# Patient Record
Sex: Female | Born: 1955 | Race: White | Hispanic: No | Smoking: Former smoker
Health system: Southern US, Community
[De-identification: ages and names within clinical notes are randomized; demographics above are authoritative.]

## PROBLEM LIST (undated history)

## (undated) DIAGNOSIS — K449 Diaphragmatic hernia without obstruction or gangrene: Secondary | ICD-10-CM

## (undated) DIAGNOSIS — T56891A Toxic effect of other metals, accidental (unintentional), initial encounter: Secondary | ICD-10-CM

## (undated) DIAGNOSIS — J309 Allergic rhinitis, unspecified: Secondary | ICD-10-CM

## (undated) DIAGNOSIS — F329 Major depressive disorder, single episode, unspecified: Secondary | ICD-10-CM

## (undated) DIAGNOSIS — J984 Other disorders of lung: Secondary | ICD-10-CM

## (undated) DIAGNOSIS — F410 Panic disorder [episodic paroxysmal anxiety] without agoraphobia: Secondary | ICD-10-CM

## (undated) DIAGNOSIS — G5793 Unspecified mononeuropathy of bilateral lower limbs: Secondary | ICD-10-CM

## (undated) DIAGNOSIS — E538 Deficiency of other specified B group vitamins: Secondary | ICD-10-CM

## (undated) DIAGNOSIS — I10 Essential (primary) hypertension: Secondary | ICD-10-CM

## (undated) DIAGNOSIS — F429 Obsessive-compulsive disorder, unspecified: Secondary | ICD-10-CM

## (undated) DIAGNOSIS — F32A Depression, unspecified: Secondary | ICD-10-CM

## (undated) DIAGNOSIS — R7301 Impaired fasting glucose: Secondary | ICD-10-CM

## (undated) DIAGNOSIS — L509 Urticaria, unspecified: Secondary | ICD-10-CM

## (undated) DIAGNOSIS — R42 Dizziness and giddiness: Secondary | ICD-10-CM

## (undated) DIAGNOSIS — T783XXA Angioneurotic edema, initial encounter: Secondary | ICD-10-CM

## (undated) DIAGNOSIS — F419 Anxiety disorder, unspecified: Secondary | ICD-10-CM

## (undated) DIAGNOSIS — E559 Vitamin D deficiency, unspecified: Secondary | ICD-10-CM

## (undated) DIAGNOSIS — N951 Menopausal and female climacteric states: Secondary | ICD-10-CM

## (undated) DIAGNOSIS — F319 Bipolar disorder, unspecified: Secondary | ICD-10-CM

## (undated) HISTORY — PX: TONSILLECTOMY: SHX5217

## (undated) HISTORY — DX: Allergic rhinitis, unspecified: J30.9

## (undated) HISTORY — PX: DILATION AND CURETTAGE OF UTERUS: SHX78

## (undated) HISTORY — DX: Panic disorder (episodic paroxysmal anxiety): F41.0

## (undated) HISTORY — DX: Morbid (severe) obesity due to excess calories: E66.01

## (undated) HISTORY — DX: Angioneurotic edema, initial encounter: T78.3XXA

## (undated) HISTORY — DX: Depression, unspecified: F32.A

## (undated) HISTORY — DX: Obsessive-compulsive disorder, unspecified: F42.9

## (undated) HISTORY — DX: Vitamin D deficiency, unspecified: E55.9

## (undated) HISTORY — DX: Diaphragmatic hernia without obstruction or gangrene: K44.9

## (undated) HISTORY — DX: Impaired fasting glucose: R73.01

## (undated) HISTORY — DX: Urticaria, unspecified: L50.9

## (undated) HISTORY — DX: Major depressive disorder, single episode, unspecified: F32.9

## (undated) HISTORY — DX: Anxiety disorder, unspecified: F41.9

## (undated) HISTORY — DX: Toxic effect of other metals, accidental (unintentional), initial encounter: T56.891A

## (undated) HISTORY — DX: Other disorders of lung: J98.4

## (undated) HISTORY — DX: Essential (primary) hypertension: I10

## (undated) HISTORY — DX: Bipolar disorder, unspecified: F31.9

## (undated) HISTORY — DX: Menopausal and female climacteric states: N95.1

## (undated) HISTORY — DX: Deficiency of other specified B group vitamins: E53.8

---

## 2012-03-17 DIAGNOSIS — I10 Essential (primary) hypertension: Secondary | ICD-10-CM | POA: Insufficient documentation

## 2013-09-28 ENCOUNTER — Ambulatory Visit: Payer: Self-pay | Admitting: Family Medicine

## 2013-09-28 DIAGNOSIS — I517 Cardiomegaly: Secondary | ICD-10-CM

## 2014-07-01 ENCOUNTER — Ambulatory Visit: Payer: Self-pay | Admitting: Family Medicine

## 2014-11-14 ENCOUNTER — Other Ambulatory Visit: Payer: Self-pay | Admitting: Family Medicine

## 2014-11-14 NOTE — Telephone Encounter (Signed)
Routing to provider  

## 2014-11-22 ENCOUNTER — Other Ambulatory Visit: Payer: Self-pay | Admitting: Family Medicine

## 2014-11-22 NOTE — Telephone Encounter (Signed)
Please check Practice Partner I just approved 60+6 on May 2nd She should not be out Please have pt contact pharmacy, she should not run out until around December 2nd I hit the red X to indicate I was not refilling because it was too soon, and it appeared to take it off the med list There was another fluoxetine on there already for 20 mg once a day and I think I corrected it, but it's gone too Can you please update med list if I've messed it up? I just didn't want to refill the medicine Thank you, Amy

## 2014-11-22 NOTE — Telephone Encounter (Signed)
Routing to provider  

## 2014-11-25 NOTE — Telephone Encounter (Signed)
Med list updated

## 2014-12-09 ENCOUNTER — Other Ambulatory Visit: Payer: Self-pay | Admitting: Family Medicine

## 2014-12-09 NOTE — Telephone Encounter (Signed)
Reviewed last labs Creatinine normal Reviewed Practice Partner She should not need any; plenty of refills sent in May Rx declined

## 2014-12-19 ENCOUNTER — Other Ambulatory Visit: Payer: Self-pay | Admitting: Family Medicine

## 2014-12-19 NOTE — Telephone Encounter (Signed)
Routing to provider  

## 2014-12-19 NOTE — Telephone Encounter (Signed)
Pt called to check the status of her refill request. Thanks.

## 2014-12-20 NOTE — Telephone Encounter (Signed)
Patient overdue for visit; please schedule her for an appointment, fasting labs in the next few weeks I'll send Rx

## 2014-12-23 DIAGNOSIS — J984 Other disorders of lung: Secondary | ICD-10-CM | POA: Insufficient documentation

## 2014-12-23 DIAGNOSIS — E538 Deficiency of other specified B group vitamins: Secondary | ICD-10-CM | POA: Insufficient documentation

## 2014-12-23 DIAGNOSIS — R7301 Impaired fasting glucose: Secondary | ICD-10-CM | POA: Insufficient documentation

## 2014-12-23 DIAGNOSIS — F329 Major depressive disorder, single episode, unspecified: Secondary | ICD-10-CM | POA: Insufficient documentation

## 2014-12-23 DIAGNOSIS — N951 Menopausal and female climacteric states: Secondary | ICD-10-CM | POA: Insufficient documentation

## 2014-12-23 DIAGNOSIS — F419 Anxiety disorder, unspecified: Secondary | ICD-10-CM | POA: Insufficient documentation

## 2014-12-23 DIAGNOSIS — I129 Hypertensive chronic kidney disease with stage 1 through stage 4 chronic kidney disease, or unspecified chronic kidney disease: Secondary | ICD-10-CM | POA: Insufficient documentation

## 2014-12-23 DIAGNOSIS — E559 Vitamin D deficiency, unspecified: Secondary | ICD-10-CM | POA: Insufficient documentation

## 2014-12-23 DIAGNOSIS — F32A Depression, unspecified: Secondary | ICD-10-CM | POA: Insufficient documentation

## 2014-12-25 ENCOUNTER — Encounter: Payer: Self-pay | Admitting: Family Medicine

## 2014-12-25 ENCOUNTER — Ambulatory Visit (INDEPENDENT_AMBULATORY_CARE_PROVIDER_SITE_OTHER): Payer: No Typology Code available for payment source | Admitting: Family Medicine

## 2014-12-25 VITALS — BP 165/82 | HR 75 | Temp 98.4°F | Ht 61.5 in | Wt 263.0 lb

## 2014-12-25 DIAGNOSIS — F429 Obsessive-compulsive disorder, unspecified: Secondary | ICD-10-CM

## 2014-12-25 DIAGNOSIS — F329 Major depressive disorder, single episode, unspecified: Secondary | ICD-10-CM | POA: Diagnosis not present

## 2014-12-25 DIAGNOSIS — K5909 Other constipation: Secondary | ICD-10-CM | POA: Diagnosis not present

## 2014-12-25 DIAGNOSIS — N289 Disorder of kidney and ureter, unspecified: Secondary | ICD-10-CM | POA: Diagnosis not present

## 2014-12-25 DIAGNOSIS — I10 Essential (primary) hypertension: Secondary | ICD-10-CM | POA: Diagnosis not present

## 2014-12-25 DIAGNOSIS — E785 Hyperlipidemia, unspecified: Secondary | ICD-10-CM | POA: Diagnosis not present

## 2014-12-25 DIAGNOSIS — K5900 Constipation, unspecified: Secondary | ICD-10-CM | POA: Insufficient documentation

## 2014-12-25 DIAGNOSIS — F42 Obsessive-compulsive disorder: Secondary | ICD-10-CM | POA: Diagnosis not present

## 2014-12-25 DIAGNOSIS — Z5181 Encounter for therapeutic drug level monitoring: Secondary | ICD-10-CM | POA: Insufficient documentation

## 2014-12-25 DIAGNOSIS — K760 Fatty (change of) liver, not elsewhere classified: Secondary | ICD-10-CM | POA: Insufficient documentation

## 2014-12-25 DIAGNOSIS — Z79899 Other long term (current) drug therapy: Secondary | ICD-10-CM | POA: Diagnosis not present

## 2014-12-25 DIAGNOSIS — F32A Depression, unspecified: Secondary | ICD-10-CM

## 2014-12-25 DIAGNOSIS — R7301 Impaired fasting glucose: Secondary | ICD-10-CM | POA: Diagnosis not present

## 2014-12-25 DIAGNOSIS — M170 Bilateral primary osteoarthritis of knee: Secondary | ICD-10-CM | POA: Diagnosis not present

## 2014-12-25 NOTE — Assessment & Plan Note (Signed)
Not controlled today, but patient has been off of one of her medicines; avers that BP is good at home; goal systolic is less than 892 mmHg; weight loss and DASH guidelines and stress reduction will help control BP

## 2014-12-25 NOTE — Assessment & Plan Note (Signed)
Noted on CT scan March 2016; weight loss and better eating would help

## 2014-12-25 NOTE — Assessment & Plan Note (Signed)
Work on weight loss, limit eggs to 3 per week or less, reduce saturated fats; try to become active

## 2014-12-25 NOTE — Patient Instructions (Addendum)
Keep up the good effort You got this! We'll contact you about your labs Return in 3 months Monitor BP at home and call if not under 130 mmHg on top Try to get 25 or more grams of fiber a day Drink 64 ounces of water a day Move more and that should help I do want you to have the MRI so please reschedule that

## 2014-12-25 NOTE — Assessment & Plan Note (Signed)
Check creatinine and lithium level

## 2014-12-25 NOTE — Assessment & Plan Note (Signed)
Needs pre- and post-contrast MRI of the right kidney; stressed to patient the importance of getting this done; don't know if benign or cancer, but we need to find out; she will call

## 2014-12-25 NOTE — Assessment & Plan Note (Signed)
Limit sweets and check A1C; work on weight loss; discussed activity

## 2014-12-25 NOTE — Assessment & Plan Note (Signed)
While I would love to have patient OFF of NSAIDs, she cannot walk and her quality of life suffers; will grudgingly continue the NSAID; encouraged weight loss, do f/u with orthopaedist when able

## 2014-12-25 NOTE — Assessment & Plan Note (Signed)
Continue lithium, been on that for years for this; current dose controlling symptoms

## 2014-12-25 NOTE — Progress Notes (Signed)
BP 165/82 mmHg  Pulse 75  Temp(Src) 98.4 F (36.9 C)  Ht 5' 1.5" (1.562 m)  Wt 263 lb (119.296 kg)  BMI 48.89 kg/m2  SpO2 96%   Subjective:    Patient ID: Jaime Freeman, female    DOB: Jan 21, 1956, 59 y.o.   MRN: [id number]  HPI: Jaime Freeman is a 59 y.o. female  Chief Complaint  Patient presents with  . Hypertension  . Hyperlipidemia  . IFG   Hypertension; she checks it at home and it's been running well, but has run out one of her medicines two days ago; had been out of benazepril for two days but is getting back on that today; BP has been running 130s and sometimes 120s; usually controlled; she says it's just that she hasn't had her medicine; no s/s of stroke; she did really good until recently; she needs to be chewed out she says; she was eating all the time, not eating the right foods; knew she was gaining weight; she has tried to watch her cholesterol and fat intake, but was eating spaghetti and lasagna; she knew she couldn't back on weight loss efforts until the stress was dealt was dealt with; she was sneaking cookies and no one knows it; it was battle; she is back on track; it has been only a week of being back on track; she stayed in bed more during the day, knees hurt and tension headaches, now getting better  She does not see psychiatrist; she does not want to see a counselor, even a Marketing executive; she says Dr. 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 Interim medical history since our last  visit reviewed. Allergies and medications reviewed and updated.  Review of Systems Per HPI unless specifically indicated above     Objective:    BP 165/82 mmHg  Pulse 75  Temp(Src) 98.4 F (36.9 C)  Ht 5' 1.5" (1.562 m)  Wt 263 lb (119.296 kg)  BMI 48.89 kg/m2  SpO2 96%  Wt Readings from Last 3 Encounters:  12/25/14 263 lb (119.296 kg)  08/26/14 257 lb (116.574 kg)    Physical Exam  Constitutional: She appears well-developed and well-nourished. No distress.  HENT:  Head: Normocephalic and atraumatic.  Eyes: EOM are normal. No scleral icterus.  Neck: No thyromegaly present.  Cardiovascular: Normal rate, regular rhythm and normal heart sounds.   No murmur heard. Pulmonary/Chest: Effort normal and breath sounds normal. No respiratory distress. She has no wheezes.  Abdominal: Soft. Bowel sounds are normal. She exhibits no distension.  Morbidly obese  Musculoskeletal: Normal range of motion. She exhibits no edema.  Neurological: She is alert. She exhibits normal muscle tone.  Skin: Skin is warm and dry. She is not diaphoretic. No pallor.  Psychiatric: She has a normal mood and affect. Her speech is normal and behavior is normal. Judgment and thought content normal. Cognition and memory are normal.  Briefly tearful when discussing the stress she's been under with church issues the last few months,  good eye contact with examiner   No results found for this or any previous visit.    Assessment & Plan:   Problem List Items Addressed This Visit      Cardiovascular and Mediastinum   Hypertension - Primary    Not controlled today, but patient has been off of one of her medicines; avers that BP is good at home; goal systolic is less than 704 mmHg; weight loss and DASH guidelines and stress reduction will help control BP        Digestive   Constipation   Relevant Orders   TSH   Fatty liver    Noted on CT scan March 2016; weight loss and better eating would help         Endocrine   IFG (impaired fasting glucose)    Limit sweets and check A1C; work on weight loss; discussed activity      Relevant Orders   Hgb A1c w/o eAG   Lipid Panel w/o Chol/HDL Ratio     Musculoskeletal and Integument   Osteoarthritis of both knees    While I would love to have patient OFF of NSAIDs, she cannot walk and her quality of life suffers; will grudgingly continue the NSAID; encouraged weight loss, do f/u with orthopaedist when able        Genitourinary   Kidney lesion    Needs pre- and post-contrast MRI of the right kidney; stressed to patient the importance of getting this done; don't know if benign or cancer, but we need to find out; she will call        Other   Depression    Recent stressors resolving; offered counseling, politely declined because she does not feel necessary right now; will continue medicine      OCD (obsessive compulsive disorder)    Continue lithium, been on that for years for this; current dose controlling symptoms      Encounter for lithium monitoring    Check creatinine and lithium level      Relevant Orders   Lithium level   Medication monitoring encounter   Relevant Orders   Comprehensive metabolic panel   Dyslipidemia    Work on weight loss, limit eggs to 3 per week or less, reduce saturated fats; try to become active          Follow up plan: Return in about 3 months (around 03/26/2015) for multiple issues.  An after-visit summary was printed and given to the patient at [facility].  Please see the patient instructions which may contain other information and recommendations beyond what is mentioned above in the assessment and plan.  Orders Placed This Encounter  Procedures  . Comprehensive metabolic panel  . Hgb A1c w/o eAG  . Lipid Panel w/o Chol/HDL Ratio  . Lithium level  . TSH

## 2014-12-25 NOTE — Assessment & Plan Note (Signed)
Recent stressors resolving; offered counseling, politely declined because she does not feel necessary right now; will continue medicine

## 2014-12-26 ENCOUNTER — Encounter: Payer: Self-pay | Admitting: Family Medicine

## 2014-12-26 LAB — COMPREHENSIVE METABOLIC PANEL
A/G RATIO: 1.9 (ref 1.1–2.5)
ALT: 14 IU/L (ref 0–32)
AST: 15 IU/L (ref 0–40)
Albumin: 4.3 g/dL (ref 3.5–5.5)
Alkaline Phosphatase: 60 IU/L (ref 39–117)
BILIRUBIN TOTAL: 0.4 mg/dL (ref 0.0–1.2)
BUN/Creatinine Ratio: 20 (ref 9–23)
BUN: 16 mg/dL (ref 6–24)
CALCIUM: 9.8 mg/dL (ref 8.7–10.2)
CO2: 23 mmol/L (ref 18–29)
Chloride: 97 mmol/L (ref 97–108)
Creatinine, Ser: 0.8 mg/dL (ref 0.57–1.00)
GFR, EST AFRICAN AMERICAN: 94 mL/min/{1.73_m2} (ref >59)
GFR, EST NON AFRICAN AMERICAN: 82 mL/min/{1.73_m2} (ref >59)
GLOBULIN, TOTAL: 2.3 g/dL (ref 1.5–4.5)
Glucose: 108 mg/dL — ABNORMAL HIGH (ref 65–99)
POTASSIUM: 4.1 mmol/L (ref 3.5–5.2)
SODIUM: 138 mmol/L (ref 134–144)
Total Protein: 6.6 g/dL (ref 6.0–8.5)

## 2014-12-26 LAB — LIPID PANEL W/O CHOL/HDL RATIO
CHOLESTEROL TOTAL: 156 mg/dL (ref 100–199)
HDL: 55 mg/dL (ref >39)
LDL CALC: 75 mg/dL (ref 0–99)
Triglycerides: 132 mg/dL (ref 0–149)
VLDL Cholesterol Cal: 26 mg/dL (ref 5–40)

## 2014-12-26 LAB — HGB A1C W/O EAG: Hgb A1c MFr Bld: 6.4 % — ABNORMAL HIGH (ref 4.8–5.6)

## 2014-12-26 LAB — TSH: TSH: 2.58 u[IU]/mL (ref 0.450–4.500)

## 2014-12-26 LAB — LITHIUM LEVEL: LITHIUM LVL: 0.7 mmol/L (ref 0.6–1.4)

## 2015-01-24 ENCOUNTER — Other Ambulatory Visit: Payer: Self-pay | Admitting: Family Medicine

## 2015-01-24 NOTE — Telephone Encounter (Signed)
Routing to provider  

## 2015-01-24 NOTE — Telephone Encounter (Signed)
Reviewed creatinine and K+ from December 25, 2014; Rx approved

## 2015-03-05 ENCOUNTER — Telehealth: Payer: Self-pay | Admitting: Family Medicine

## 2015-03-05 ENCOUNTER — Other Ambulatory Visit: Payer: Self-pay | Admitting: Family Medicine

## 2015-03-05 MED ORDER — ATORVASTATIN CALCIUM 10 MG PO TABS: 10.0000 mg | ORAL_TABLET | Freq: Every day | ORAL | Status: DC

## 2015-03-26 ENCOUNTER — Ambulatory Visit: Payer: No Typology Code available for payment source | Admitting: Family Medicine

## 2015-03-26 NOTE — Telephone Encounter (Signed)
Rx was refilled on 03/05/15 for 30 with 1 refill. Patient notified.

## 2015-03-26 NOTE — Telephone Encounter (Signed)
Pt came in stated she needs a refill on Atorvastatin. Pharm is [facility]. Pt has been rescheduled for 04/07/15. Thanks.

## 2015-04-07 ENCOUNTER — Encounter: Payer: Self-pay | Admitting: Family Medicine

## 2015-04-07 ENCOUNTER — Ambulatory Visit (INDEPENDENT_AMBULATORY_CARE_PROVIDER_SITE_OTHER): Payer: Self-pay | Admitting: Family Medicine

## 2015-04-07 ENCOUNTER — Other Ambulatory Visit: Payer: Self-pay | Admitting: Family Medicine

## 2015-04-07 VITALS — BP 147/84 | HR 77 | Temp 97.8°F | Wt 261.0 lb

## 2015-04-07 DIAGNOSIS — R6881 Early satiety: Secondary | ICD-10-CM

## 2015-04-07 DIAGNOSIS — R140 Abdominal distension (gaseous): Secondary | ICD-10-CM | POA: Insufficient documentation

## 2015-04-07 DIAGNOSIS — F429 Obsessive-compulsive disorder, unspecified: Secondary | ICD-10-CM

## 2015-04-07 DIAGNOSIS — K760 Fatty (change of) liver, not elsewhere classified: Secondary | ICD-10-CM

## 2015-04-07 DIAGNOSIS — N289 Disorder of kidney and ureter, unspecified: Secondary | ICD-10-CM

## 2015-04-07 DIAGNOSIS — R7301 Impaired fasting glucose: Secondary | ICD-10-CM

## 2015-04-07 DIAGNOSIS — Z79899 Other long term (current) drug therapy: Secondary | ICD-10-CM

## 2015-04-07 DIAGNOSIS — L738 Other specified follicular disorders: Secondary | ICD-10-CM

## 2015-04-07 DIAGNOSIS — D3701 Neoplasm of uncertain behavior of lip: Secondary | ICD-10-CM

## 2015-04-07 DIAGNOSIS — M170 Bilateral primary osteoarthritis of knee: Secondary | ICD-10-CM

## 2015-04-07 DIAGNOSIS — I10 Essential (primary) hypertension: Secondary | ICD-10-CM

## 2015-04-07 DIAGNOSIS — E785 Hyperlipidemia, unspecified: Secondary | ICD-10-CM

## 2015-04-07 DIAGNOSIS — Z5181 Encounter for therapeutic drug level monitoring: Secondary | ICD-10-CM

## 2015-04-07 MED ORDER — LITHIUM CARBONATE 300 MG PO CAPS: 600.0000 mg | ORAL_CAPSULE | Freq: Two times a day (BID) | ORAL | Status: DC

## 2015-04-07 MED ORDER — BENAZEPRIL HCL 20 MG PO TABS: 20.0000 mg | ORAL_TABLET | Freq: Every day | ORAL | Status: DC

## 2015-04-07 MED ORDER — HYDROCHLOROTHIAZIDE 12.5 MG PO CAPS: 12.5000 mg | ORAL_CAPSULE | Freq: Every day | ORAL | Status: DC

## 2015-04-07 NOTE — Telephone Encounter (Signed)
It looks like most of these will be due in Jan.

## 2015-04-07 NOTE — Telephone Encounter (Signed)
I need to know who is prescribing her Klonopin; please call her Thank you

## 2015-04-07 NOTE — Telephone Encounter (Signed)
Pt stated she forgot to tell Dr. Sanda Klein she needs refills on all medications. Pharm is [facility]. Thanks.

## 2015-04-07 NOTE — Progress Notes (Signed)
BP 147/84 mmHg  Pulse 77  Temp(Src) 97.8 F (36.6 C)  Wt 261 lb (118.389 kg)  SpO2 97%   Subjective:    Patient ID: Jaime Freeman, female    DOB: 06-Aug-1955, 59 y.o.   MRN: [id number]  HPI: Jaime Freeman is a 59 y.o. female  Chief Complaint  Patient presents with  . Hypertension  . IFG  . Obesity   High blood pressure; it was higher a few months ago, 150s and 160s a few months ago; they merged with another church; they left and stress levels are coming down and BP returning to normal; has not checked BP for the last 2-3 weeks  Impaired fasting glucose; next A1C due late Feb or early March; God is dealing with her with the temptation; it's Christmas and it's hard to go cold [place]; she and her husband are going to go on a strict diet and exercise December 25, 2014: Hgb A1c MFr Bld 4.8 - 5.6 % 6.4 (H)       Obesity; she craves sweets, very easy to fall into the temptation; see above; she has lost two pounds since last visit; she can tell that she has gained weight in her abdomen  She used to take a statin; not now; we reviewed her last lipids, done in August 2016: Cholesterol, Total 100 - 199 mg/dL 156   Triglycerides 0 - 149 mg/dL 132   HDL >39 mg/dL 55   Comments: According to ATP-III Guidelines, HDL-C >59 mg/dL is considered a  negative risk factor for CHD.     VLDL Cholesterol Cal 5 - 40 mg/dL 26   LDL Calculated 0 - 99 mg/dL 75        Bipolar disorder; taking lithium and SSRI Last labs end of August 2016; next due late Feb or early March 2017 Lithium Lvl 0.6 - 1.4 mmol/L 0.7       Glucose 65 - 99 mg/dL 108 (H)   BUN 6 - 24 mg/dL 16   Creatinine, Ser 0.57 - 1.00 mg/dL 0.80   GFR calc non Af Amer >59 mL/min/1.73 82   GFR calc Af Amer >59 mL/min/1.73 94   BUN/Creatinine Ratio 9 - 23  20   Sodium 134 - 144 mmol/L 138   Potassium 3.5 - 5.2 mmol/L 4.1   Chloride 97 - 108 mmol/L 97   CO2 18 - 29 mmol/L 23   Calcium 8.7 - 10.2 mg/dL 9.8         Bilirubin Total 0.0 - 1.2 mg/dL 0.4   Alkaline Phosphatase 39 - 117 IU/L 60   AST 0 - 40 IU/L 15   ALT 0 - 32 IU/L 14        She has had two pimple like things, one on the lip (lower) and one on the abdomen  Her legs hurt, that is normal for her in a way; at this time of her; she has arthritis in her knees; taking etodolac; in the winter, her legs will ache; she has had venous duplex scans already beefore and they never show anything; she moved the other day and something pinched and it caused pain all the way down to the foot; her calves get sore, aching; muscles are a little tight; her feet are going numb like crazy; her feet will draw up and it will cramp; hands don't draw up; feeling skipped beats, flutter feeling occasionally  She has some swelling in her legs; worse at night; when sitting; consider peripheral  compression; echo 2015 was reviewed, LVEF 55-60%  Renal lesion on [place] from March; she never got the MRI, she says it was insurance thing and apparently she just didn't get around to it; she was worried I would be upset (I told her no, but I do want [place] to look into this right away)  Throbbing on the left side near ovary; lasts five minutes, thirty minutes; few and far between; no early satiety; doesn't eat as fast, thinks her stomach has shrunk; sometimes have swelling, just overweight and feels that way; had some constipation when first starting the statin, that is resolved now with colace completely; no nausea or vomiting; no blood in urine or stool  Relevant past medical, surgical, family and social history reviewed and updated as indicated. Interim medical history since our last visit reviewed. Allergies and medications reviewed and updated.  Review of Systems  Respiratory: Positive for shortness of breath (at the mall; she has been lazy lately; not walking, not getting exercise; would just lay for days, being out of shape).   Cardiovascular: Negative for chest pain.  that  is all better now; just deconditioning  Per HPI unless specifically indicated above     Objective:    BP 147/84 mmHg  Pulse 77  Temp(Src) 97.8 F (36.6 C)  Wt 261 lb (118.389 kg)  SpO2 97%  Wt Readings from Last 3 Encounters:  04/07/15 261 lb (118.389 kg)  12/25/14 263 lb (119.296 kg)  08/26/14 257 lb (116.574 kg)  body mass index is 48.52 kg/(m^2).  Physical Exam  Constitutional: She appears well-nourished. No distress.  Weight down two pounds since August; morbidly obese  HENT:  Nose: No rhinorrhea.  Mouth/Throat: Mucous membranes are normal.  Eyes: EOM are normal. No scleral icterus.  Neck: No thyromegaly present.  Cardiovascular: Normal rate and regular rhythm.   Pulmonary/Chest: Effort normal and breath sounds normal.  Abdominal: Soft. She exhibits no distension (obese). There is tenderness (mild; morbid obesity limits sensitivity of physical exam, as I am unable to palpate any masses) in the left lower quadrant.  Musculoskeletal: She exhibits no edema (no pitting edema of lower extremities).  Neurological: She is alert.  Skin: Skin is warm. Lesion (vermillion border lower lip, 2 mm papule with central pore; 3 mm keratotic lesion abdomen) noted.  Psychiatric: She has a normal mood and affect. Her behavior is normal. Judgment and thought content normal.   Results for orders placed or performed in visit on 12/25/14  Comprehensive metabolic panel  Result Value Ref Range   Glucose 108 (H) 65 - 99 mg/dL   BUN 16 6 - 24 mg/dL   Creatinine, Ser 0.80 0.57 - 1.00 mg/dL   GFR calc non Af Amer 82 >59 mL/min/1.73   GFR calc Af Amer 94 >59 mL/min/1.73   BUN/Creatinine Ratio 20 9 - 23   Sodium 138 134 - 144 mmol/L   Potassium 4.1 3.5 - 5.2 mmol/L   Chloride 97 97 - 108 mmol/L   CO2 23 18 - 29 mmol/L   Calcium 9.8 8.7 - 10.2 mg/dL   Total Protein 6.6 6.0 - 8.5 g/dL   Albumin 4.3 3.5 - 5.5 g/dL   Globulin, Total 2.3 1.5 - 4.5 g/dL   Albumin/Globulin Ratio 1.9 1.1 - 2.5    Bilirubin Total 0.4 0.0 - 1.2 mg/dL   Alkaline Phosphatase 60 39 - 117 IU/L   AST 15 0 - 40 IU/L   ALT 14 0 - 32 IU/L  Hgb A1c w/o  eAG  Result Value Ref Range   Hgb A1c MFr Bld 6.4 (H) 4.8 - 5.6 %  Lipid Panel w/o Chol/HDL Ratio  Result Value Ref Range   Cholesterol, Total 156 100 - 199 mg/dL   Triglycerides 132 0 - 149 mg/dL   HDL 55 >39 mg/dL   VLDL Cholesterol Cal 26 5 - 40 mg/dL   LDL Calculated 75 0 - 99 mg/dL  Lithium level  Result Value Ref Range   Lithium Lvl 0.7 0.6 - 1.4 mmol/L  TSH  Result Value Ref Range   TSH 2.580 0.450 - 4.500 uIU/mL      Assessment & Plan:   Problem List Items Addressed This Visit      Cardiovascular and Mediastinum   Hypertension    Not quite to goal; her stress level is easing off; weight loss, DASH guidelines; see AVS        Digestive   Fatty liver    Weight loss key, along with healthy diet; last LFTs in normal range; recheck in late Feb, early March      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, vermilion border    Currently, this appears to be an inclusion cyst; however, the location is a prime one for skin cancer; watch carefully and if growing or changing at all, we'll have her see dermatology        Endocrine   IFG (impaired fasting glucose) - Primary    Return in late Feb or early march, check A1c; weight loss is key to preventing progression to frank diabetes; gave her my analogy of window AC unit, unable to keep up if house gets addition; work on reducing body habitus; follow A1c every 6 months; healthy eating, activity key        Musculoskeletal and Integument   Osteoarthritis of both knees    AAOS now recommends weight loss as a focus for OA of the knees      Sebaceous gland hyperplasia of face    Explained that this particular lesion appears to be a benign condition        Genitourinary   Kidney lesion    We reviewed the last [place]; 12 mm lesion right kidney; discussed options; she would rather not get  expensive MRI; we agreed to get [place] to save money and then go from there      Relevant Orders   US Renal     Other   Morbid obesity ([place])    So very important for her to lose weight; see AVS      OCD (obsessive compulsive disorder)    Continue SSRI      Encounter for lithium monitoring    Recheck lithium level in late Feb or early March 2017      Dyslipidemia    Recheck lipids in late Feb or early March 2017 (fasting); weight loss and healthy eating are key      Early satiety   Relevant Orders   US Pelvis Complete   Bloating    Will get [place] of pelvis to r/o ovarian lesion      Relevant Orders   US Pelvis Complete      Follow up plan: Return in about 1 month (around 05/08/2015) for multiple issues, 30 minute visit.  Level 5 Face-to-face time with patient was more than 45 minutes, >50% time spent counseling and coordination of care  An after-visit summary was printed and given to the patient at [facility].  Please see the  patient instructions which may contain other information and recommendations beyond what is mentioned above in the assessment and plan.

## 2015-04-07 NOTE — Patient Instructions (Addendum)
With exercise, start slowly and build up very gradually Watch the places on your skin and if they grow or bleed or change (scab or turn colors), then see a dermatologist Drink 4 ounces of tonic water every evening Start taking 250 mg of magnesium oxide daily or eat more foods rich in magnesium Stop the atorvastatin for one month Do gentle stretches for the feet and legs Start taking vitamin D 2,000 iu daily (vitamin D3) Try stretches for your feet (belt or cord or towel over the balls of the feet and gently pull back) Try to work on weight loss  Check out the information at familydoctor.org entitled "What It Takes to Lose Weight" Try to lose between 1-2 pounds per week by taking in fewer calories and burning off more calories You can succeed by limiting portions, limiting foods dense in calories and fat, becoming more active, and drinking 8 glasses of water a day (64 ounces) Don't skip meals, especially breakfast, as skipping meals may alter your metabolism Do not use over-the-counter weight loss pills or gimmicks that claim rapid weight loss A healthy BMI (or body mass index) is between 18.5 and 24.9 You can calculate your ideal BMI at the [place] website [employer]Mon[employer]fr  We'll get the ultrasound of the abdomen and pelvis Return in one month for recheck of multiple issues

## 2015-04-07 NOTE — Assessment & Plan Note (Addendum)
We reviewed the last [place]; 12 mm lesion right kidney; discussed options; she would rather not get expensive MRI; we agreed to get [place] to save money and then go from there

## 2015-04-07 NOTE — Assessment & Plan Note (Addendum)
Return in late Feb or early march, check A1c; weight loss is key to preventing progression to frank diabetes; gave her my analogy of window AC unit, unable to keep up if house gets addition; work on reducing body habitus; follow A1c every 6 months; healthy eating, activity key

## 2015-04-08 NOTE — Telephone Encounter (Signed)
Dr. Jeananne Rama was prescribing it to begin with and you last wrote it in May of this year for a qty of 10 per PP. She doesn't use it often, just wanted to have it on hold in case. She is not out right now.

## 2015-04-09 ENCOUNTER — Other Ambulatory Visit: Payer: Self-pay | Admitting: Family Medicine

## 2015-04-09 DIAGNOSIS — R140 Abdominal distension (gaseous): Secondary | ICD-10-CM

## 2015-04-09 NOTE — Assessment & Plan Note (Signed)
transvag US needed per staff, ordered

## 2015-04-12 DIAGNOSIS — L738 Other specified follicular disorders: Secondary | ICD-10-CM | POA: Insufficient documentation

## 2015-04-12 DIAGNOSIS — D3701 Neoplasm of uncertain behavior of lip: Secondary | ICD-10-CM | POA: Insufficient documentation

## 2015-04-12 NOTE — Assessment & Plan Note (Signed)
AAOS now recommends weight loss as a focus for OA of the knees

## 2015-04-12 NOTE — Assessment & Plan Note (Signed)
- 

## 2015-04-12 NOTE — Assessment & Plan Note (Signed)
Currently, this appears to be an inclusion cyst; however, the location is a prime one for skin cancer; watch carefully and if growing or changing at all, we'll have her see dermatology

## 2015-04-12 NOTE — Assessment & Plan Note (Signed)
So very important for her to lose weight; see AVS

## 2015-04-12 NOTE — Assessment & Plan Note (Signed)
Recheck lipids in late Feb or early March 2017 (fasting); weight loss and healthy eating are key

## 2015-04-12 NOTE — Assessment & Plan Note (Signed)
Not quite to goal; her stress level is easing off; weight loss, DASH guidelines; see AVS

## 2015-04-12 NOTE — Assessment & Plan Note (Signed)
Will get [place] of pelvis to r/o ovarian lesion

## 2015-04-12 NOTE — Assessment & Plan Note (Signed)
Weight loss key, along with healthy diet; last LFTs in normal range; recheck in late Feb, early March

## 2015-04-12 NOTE — Assessment & Plan Note (Signed)
Explained that this particular lesion appears to be a benign condition

## 2015-04-12 NOTE — Assessment & Plan Note (Signed)
Recheck lithium level in late Feb or early March 2017

## 2015-04-29 ENCOUNTER — Ambulatory Visit
Admission: RE | Admit: 2015-04-29 | Discharge: 2015-04-29 | Disposition: A | Discharge status: Home / Self Care | Payer: BLUE CROSS/BLUE SHIELD | Source: Ambulatory Visit | Attending: Family Medicine | Admitting: Family Medicine

## 2015-04-29 DIAGNOSIS — Z780 Asymptomatic menopausal state: Secondary | ICD-10-CM | POA: Diagnosis not present

## 2015-04-29 DIAGNOSIS — R140 Abdominal distension (gaseous): Secondary | ICD-10-CM | POA: Diagnosis not present

## 2015-04-29 DIAGNOSIS — N289 Disorder of kidney and ureter, unspecified: Secondary | ICD-10-CM | POA: Insufficient documentation

## 2015-04-29 DIAGNOSIS — R6881 Early satiety: Secondary | ICD-10-CM | POA: Diagnosis not present

## 2015-05-08 ENCOUNTER — Encounter: Payer: Self-pay | Admitting: Family Medicine

## 2015-05-08 ENCOUNTER — Ambulatory Visit (INDEPENDENT_AMBULATORY_CARE_PROVIDER_SITE_OTHER): Payer: BLUE CROSS/BLUE SHIELD | Admitting: Family Medicine

## 2015-05-08 VITALS — BP 132/74 | HR 68 | Temp 97.9°F | Wt 263.0 lb

## 2015-05-08 DIAGNOSIS — R7301 Impaired fasting glucose: Secondary | ICD-10-CM

## 2015-05-08 DIAGNOSIS — N289 Disorder of kidney and ureter, unspecified: Secondary | ICD-10-CM

## 2015-05-08 DIAGNOSIS — M170 Bilateral primary osteoarthritis of knee: Secondary | ICD-10-CM | POA: Diagnosis not present

## 2015-05-08 DIAGNOSIS — I10 Essential (primary) hypertension: Secondary | ICD-10-CM | POA: Diagnosis not present

## 2015-05-08 DIAGNOSIS — K760 Fatty (change of) liver, not elsewhere classified: Secondary | ICD-10-CM | POA: Diagnosis not present

## 2015-05-08 DIAGNOSIS — G629 Polyneuropathy, unspecified: Secondary | ICD-10-CM | POA: Insufficient documentation

## 2015-05-08 DIAGNOSIS — Z79899 Other long term (current) drug therapy: Secondary | ICD-10-CM

## 2015-05-08 DIAGNOSIS — E785 Hyperlipidemia, unspecified: Secondary | ICD-10-CM | POA: Diagnosis not present

## 2015-05-08 DIAGNOSIS — G63 Polyneuropathy in diseases classified elsewhere: Secondary | ICD-10-CM

## 2015-05-08 DIAGNOSIS — Z5181 Encounter for therapeutic drug level monitoring: Secondary | ICD-10-CM

## 2015-05-08 LAB — UA/M W/RFLX CULTURE, ROUTINE
Bilirubin, UA: POSITIVE — AB
GLUCOSE, UA: NEGATIVE
KETONES UA: NEGATIVE
LEUKOCYTES UA: NEGATIVE
NITRITE UA: NEGATIVE
PROTEIN UA: NEGATIVE
RBC UA: NEGATIVE
SPEC GRAV UA: 1.015 (ref 1.005–1.030)
Urobilinogen, Ur: 0.2 mg/dL (ref 0.2–1.0)
pH, UA: 7 (ref 5.0–7.5)

## 2015-05-08 LAB — MICROALBUMIN, URINE WAIVED
Creatinine, Urine Waived: 50 mg/dL (ref 10–300)
Microalb, Ur Waived: 10 mg/L (ref 0–19)

## 2015-05-08 NOTE — Progress Notes (Signed)
BP 132/74 mmHg  Pulse 68  Temp(Src) 97.9 F (36.6 C)  Wt 263 lb (119.296 kg)  SpO2 98%   Subjective:    Patient ID: Jaime Freeman, female    DOB: 21-Apr-1956, 60 y.o.   MRN: [id number]  HPI: Jaime Freeman is a 60 y.o. female  Chief Complaint  Patient presents with  . Follow-up    1 month follow up, discuss u/s report  . Numbness    both feet for a long time, but is better since she went off the Atorvastatin.   High blood pressure; not checking at home the last week; well-controlled today; she could feel before when her pressure was elevated; stable on BP meds, no side effects  She underwent an ultrasound on April 29, 2015 When she had the test done last time, weighed 10 pounds more She denies abd pain, weight loss, night sweats, hematuria The lesion was not found at all on this scan and she really believes God took care of this She is not worried at all, is completely asymptomatic   CLINICAL DATA: Follow-up of a right lower pole renal lesion demonstrated on CT scan of March of 2016  EXAM: RENAL / URINARY TRACT ULTRASOUND COMPLETE  COMPARISON: March seventh 2016 abdominal and pelvic CT scan  FINDINGS: Right Kidney:  Length: 12.7 cm. The renal cortical echotexture is normal. No cystic or solid parenchymal mass is observed. Specifically in the area of the low density structure on the previous CT scan no abnormality is observed. No stones are evident and there is no hydronephrosis.  Left Kidney:  Length: 12.6 cm. Echogenicity within normal limits. No mass or hydronephrosis visualized.  Bladder:  The urinary bladder is nondistended and could not be adequately evaluated.  IMPRESSION: No right renal lesion is observed on today's ultrasound. If further imaging is felt indicated clinically, a renal protocol CT scan or MRI would be the most useful imaging step.   Electronically Signed  By: David Martinique M.D.  On: 04/29/2015 11:02   She  stopped the atorvastatin, everything has been better since stopping that; she is watching what she eats; reviewed last lipid panel from August 2016:  Lab Results  Component Value Date   CHOL 156 12/25/2014   Lab Results  Component Value Date   HDL 55 12/25/2014   Lab Results  Component Value Date   LDLCALC 75 12/25/2014   Lab Results  Component Value Date   TRIG 132 12/25/2014   No results found for: CHOLHDL No results found for: LDLDIRECT   Neuropathy in the left foot; 10 years ago; feels like a rubber band is around her toe, says the orthopaedist talked to her about this, 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; numb across all five toes, slow gradual worsening; last A1c reviewed, 6.4 in August 2016; she wore [employer] for years; had fallen arch in the foot before and the toes were pressed up and the shoes were too small; foot can draw and twist and draw toes up; not painful, like a cramp; she is on lodine for her arthritis and is aware of the risk of kidney damage; she is going to get shots in her knees from ortho; from ankle down, but mostly just feet; both feet are involved, not just the one (left) foot  Relevant past medical, surgical, family and social history reviewed and updated as indicated Interim medical history since our last visit reviewed; US done; saw orthopaedist No diabetes in the family; just heart and cancer  Allergies and medications reviewed and updated.  Review of Systems  Constitutional: Negative for diaphoresis and unexpected weight change.  Gastrointestinal: Negative for abdominal pain.  Genitourinary: Negative for hematuria.  Musculoskeletal: Positive for arthralgias (knees, sees orthopaedist, going to get shots soon).  Neurological: Positive for numbness (both feet).  Per HPI unless specifically indicated above     Objective:    BP 132/74 mmHg  Pulse 68  Temp(Src) 97.9 F (36.6 C)  Wt 263 lb (119.296 kg)  SpO2 98%  Wt Readings from Last 3  Encounters:  05/08/15 263 lb (119.296 kg)  04/07/15 261 lb (118.389 kg)  12/25/14 263 lb (119.296 kg)  body mass index is 48.89 kg/(m^2).  Physical Exam  Constitutional: She appears well-developed and well-nourished.  Morbidly obese; weight up 2 pounds over last month  HENT:  Mouth/Throat: Mucous membranes are normal.  Eyes: EOM are normal. No scleral icterus.  Cardiovascular: Normal rate and regular rhythm.   Pulses:      Dorsalis pedis pulses are 1+ on the left side.  Pulmonary/Chest: Effort normal and breath sounds normal.  Abdominal: She exhibits no distension.  Morbidly obese  Musculoskeletal: She exhibits no edema.  Neurological: She displays no atrophy and no tremor. No sensory deficit (intact sensation to monofilament testing left foot). She exhibits normal muscle tone.  Skin: Skin is dry.  Psychiatric: She has a normal mood and affect. Her behavior is normal.   Results for orders placed or performed in visit on 12/25/14  Comprehensive metabolic panel  Result Value Ref Range   Glucose 108 (H) 65 - 99 mg/dL   BUN 16 6 - 24 mg/dL   Creatinine, Ser 0.80 0.57 - 1.00 mg/dL   GFR calc non Af Amer 82 >59 mL/min/1.73   GFR calc Af Amer 94 >59 mL/min/1.73   BUN/Creatinine Ratio 20 9 - 23   Sodium 138 134 - 144 mmol/L   Potassium 4.1 3.5 - 5.2 mmol/L   Chloride 97 97 - 108 mmol/L   CO2 23 18 - 29 mmol/L   Calcium 9.8 8.7 - 10.2 mg/dL   Total Protein 6.6 6.0 - 8.5 g/dL   Albumin 4.3 3.5 - 5.5 g/dL   Globulin, Total 2.3 1.5 - 4.5 g/dL   Albumin/Globulin Ratio 1.9 1.1 - 2.5   Bilirubin Total 0.4 0.0 - 1.2 mg/dL   Alkaline Phosphatase 60 39 - 117 IU/L   AST 15 0 - 40 IU/L   ALT 14 0 - 32 IU/L  Hgb A1c w/o eAG  Result Value Ref Range   Hgb A1c MFr Bld 6.4 (H) 4.8 - 5.6 %  Lipid Panel w/o Chol/HDL Ratio  Result Value Ref Range   Cholesterol, Total 156 100 - 199 mg/dL   Triglycerides 132 0 - 149 mg/dL   HDL 55 >39 mg/dL   VLDL Cholesterol Cal 26 5 - 40 mg/dL   LDL  Calculated 75 0 - 99 mg/dL  Lithium level  Result Value Ref Range   Lithium Lvl 0.7 0.6 - 1.4 mmol/L  TSH  Result Value Ref Range   TSH 2.580 0.450 - 4.500 uIU/mL      Assessment & Plan:   Problem List Items Addressed This Visit      Cardiovascular and Mediastinum   Hypertension - Primary    Check urine microalbumin today; well-controlled blood pressure today; continue to work on weight loss (this is a struggle for her); continue meds      Relevant Orders   Microalbumin, Urine Waived  Digestive   Fatty liver    Check LFTs at visit in March; work on weight loss (which is a struggle for her)        Endocrine   IFG (impaired fasting glucose)    Last A1c was 6.4, so she is right on the cusp of developing type 2 diabetes; we talked about neuropathy being a sign of diabetes; she struggles with weight loss; check A1c at March visit        Nervous and Auditory   Peripheral neuropathy [facility])    Discussed with patient reasons for neuropathy in the feet including diabetes, B12 deficiency, thyroid disease, compression, heavy metal toxicity; she will pass on EMG/NCS testing offered by me and follow up with her orthopaedist to see if possible orthopaedic cause (morton's neuroma, e.g.) may be the culprit; if no, then we'll get additional testing in March (TSH, B12, A1c initially and then proceed with SPEP, UPEP, heavy metals, etc.)        Musculoskeletal and Integument   Osteoarthritis of both knees    She sees orthopaedist and will be seeing them soon; weight loss would like help this        Genitourinary   Kidney lesion    We reviewed the report in detail; lesion not found on [place]; will check urine today to r/o hematuria; otherwise, she feels healed by God, and we also discussed the preponderance of testing that sometimes occurs when something is found that is not significant; does not mean that a few people with the finding won't have cancer, but vast majority of patients with  something small may turn up nothing at all; she does not want further testing at this time, and I am fine with that and in agreement; she denies any symptoms of cancer (hematuria, abd pain, weight loss, night sweats, etc); she will call me with any changes in symptoms      Relevant Orders   UA/M w/rflx Culture, Routine     Other   Morbid obesity ([place])    She has struggled with this; I encouraged her to fight her good fight      Encounter for lithium monitoring    Due for lithium and creatinine in March      Medication monitoring encounter    Keep eye on kidney funciton; talked to patient about lithium plus NSAID use, likely causing long-term chronic kidney damage, balance risks versus benefits (she doesn't think she could walk well or be active without the NSAID)      Dyslipidemia    paitent is now off of statin, feeling much better; will check fsating lipids in March          Follow up plan: Return in about 7 weeks (around 06/25/2015) for thirty minute follow-up with fasting labs.  An after-visit summary was printed and given to the patient at [facility].  Please see the patient instructions which may contain other information and recommendations beyond what is mentioned above in the assessment and plan.  Orders Placed This Encounter  Procedures  . UA/M w/rflx Culture, Routine  . Microalbumin, Urine Waived

## 2015-05-08 NOTE — Assessment & Plan Note (Signed)
Keep eye on kidney funciton; talked to patient about lithium plus NSAID use, likely causing long-term chronic kidney damage, balance risks versus benefits (she doesn't think she could walk well or be active without the NSAID)

## 2015-05-08 NOTE — Assessment & Plan Note (Signed)
She has struggled with this; I encouraged her to fight her good fight

## 2015-05-08 NOTE — Assessment & Plan Note (Signed)
Due for lithium and creatinine in March

## 2015-05-08 NOTE — Patient Instructions (Addendum)
Return on or just after March 1st for fasting labs  Try to limit saturated fats in your diet (bologna, hot dogs, barbeque, cheeseburgers, hamburgers, steak, bacon, sausage, cheese, etc.) and get more fresh fruits, vegetables, and whole grains  Follow-up with orthopaedist about your neuropathy and foot pain  Continue to fight your good fight

## 2015-05-08 NOTE — Assessment & Plan Note (Signed)
Check LFTs at visit in March; work on weight loss (which is a struggle for her)

## 2015-05-08 NOTE — Assessment & Plan Note (Signed)
Discussed with patient reasons for neuropathy in the feet including diabetes, B12 deficiency, thyroid disease, compression, heavy metal toxicity; she will pass on EMG/NCS testing offered by me and follow up with her orthopaedist to see if possible orthopaedic cause (morton's neuroma, e.g.) may be the culprit; if no, then we'll get additional testing in March (TSH, B12, A1c initially and then proceed with SPEP, UPEP, heavy metals, etc.)

## 2015-05-08 NOTE — Assessment & Plan Note (Signed)
paitent is now off of statin, feeling much better; will check fsating lipids in March

## 2015-05-08 NOTE — Assessment & Plan Note (Addendum)
Check urine microalbumin today; well-controlled blood pressure today; continue to work on weight loss (this is a struggle for her); continue meds

## 2015-05-08 NOTE — Assessment & Plan Note (Signed)
She sees orthopaedist and will be seeing them soon; weight loss would like help this

## 2015-05-08 NOTE — Assessment & Plan Note (Addendum)
We reviewed the report in detail; lesion not found on [place]; will check urine today to r/o hematuria; otherwise, she feels healed by God, and we also discussed the preponderance of testing that sometimes occurs when something is found that is not significant; does not mean that a few people with the finding won't have cancer, but vast majority of patients with something small may turn up nothing at all; she does not want further testing at this time, and I am fine with that and in agreement; she denies any symptoms of cancer (hematuria, abd pain, weight loss, night sweats, etc); she will call me with any changes in symptoms

## 2015-05-08 NOTE — Assessment & Plan Note (Signed)
Last A1c was 6.4, so she is right on the cusp of developing type 2 diabetes; we talked about neuropathy being a sign of diabetes; she struggles with weight loss; check A1c at March visit

## 2015-05-10 ENCOUNTER — Telehealth: Payer: Self-pay | Admitting: Family Medicine

## 2015-05-10 NOTE — Telephone Encounter (Signed)
Discussed lab results with patient; spilling protein; continue ACE-I; limit animal protein; work on weight loss, recheck at f/u No blood in urine

## 2015-06-02 ENCOUNTER — Other Ambulatory Visit: Payer: Self-pay | Admitting: Family Medicine

## 2015-06-02 NOTE — Telephone Encounter (Signed)
HCTZ needs to be refilled, she thought there was more refills at the pharmacy but was mistaken.  Would like it called in today as her blood pressure has been very good lately.  [facility].

## 2015-06-02 NOTE — Telephone Encounter (Signed)
Routing to provider  

## 2015-06-03 MED ORDER — HYDROCHLOROTHIAZIDE 12.5 MG PO CAPS: 12.5000 mg | ORAL_CAPSULE | Freq: Every day | ORAL | Status: DC

## 2015-06-03 NOTE — Telephone Encounter (Signed)
Last Cr and GFR reviewed Rx sent

## 2015-06-16 ENCOUNTER — Ambulatory Visit: Payer: BLUE CROSS/BLUE SHIELD | Admitting: Family Medicine

## 2015-06-26 ENCOUNTER — Ambulatory Visit: Payer: BLUE CROSS/BLUE SHIELD | Admitting: Family Medicine

## 2015-06-26 ENCOUNTER — Telehealth: Payer: Self-pay | Admitting: Family Medicine

## 2015-06-26 MED ORDER — AMOXICILLIN-POT CLAVULANATE 875-125 MG PO TABS: 1.0000 | ORAL_TABLET | Freq: Two times a day (BID) | ORAL | Status: AC

## 2015-06-26 NOTE — Telephone Encounter (Signed)
She is hurting all in her cheekbones, nose, cannot breathe, sinuses are burning like fire, eyes hurt so bad, sinuses, top jaw hurting; she says her sinuses are dropped into her teeth and gums, found on xrays by her dentist; not fever; trying to rest as much as possible; had to cancel her appt this week, did not have much money; she would have been happy to have come seen me but pastor life difficult; I'll send in Rx; probiotics or yogurt; come seen when able

## 2015-06-26 NOTE — Telephone Encounter (Signed)
Routing to provider  

## 2015-06-26 NOTE — Telephone Encounter (Signed)
Patient called needing to talk to the provider regarding issue with her coming in and see if Dr. Sanda Klein can help her out. Patient phone number is [phone] , [phone].Thanks.

## 2015-07-30 ENCOUNTER — Other Ambulatory Visit: Payer: Self-pay

## 2015-07-30 ENCOUNTER — Telehealth: Payer: Self-pay | Admitting: Family Medicine

## 2015-07-30 MED ORDER — BENAZEPRIL HCL 20 MG PO TABS: 20.0000 mg | ORAL_TABLET | Freq: Every day | ORAL | Status: DC

## 2015-07-30 NOTE — Telephone Encounter (Signed)
Appt scheduled

## 2015-07-30 NOTE — Telephone Encounter (Signed)
I'll refill BP med as requested However, please ask patient to schedule a follow-up appointment and we'll get fasting labs then Thank you

## 2015-07-30 NOTE — Telephone Encounter (Signed)
Pt needs refill on BP to be sent to [facility].

## 2015-08-13 ENCOUNTER — Encounter: Payer: Self-pay | Admitting: Family Medicine

## 2015-08-13 ENCOUNTER — Ambulatory Visit (INDEPENDENT_AMBULATORY_CARE_PROVIDER_SITE_OTHER): Payer: Self-pay | Admitting: Family Medicine

## 2015-08-13 VITALS — BP 140/84 | HR 87 | Temp 98.1°F | Resp 14 | Wt 263.0 lb

## 2015-08-13 DIAGNOSIS — F32A Depression, unspecified: Secondary | ICD-10-CM

## 2015-08-13 DIAGNOSIS — G4700 Insomnia, unspecified: Secondary | ICD-10-CM

## 2015-08-13 DIAGNOSIS — K760 Fatty (change of) liver, not elsewhere classified: Secondary | ICD-10-CM

## 2015-08-13 DIAGNOSIS — E785 Hyperlipidemia, unspecified: Secondary | ICD-10-CM

## 2015-08-13 DIAGNOSIS — Z79899 Other long term (current) drug therapy: Secondary | ICD-10-CM

## 2015-08-13 DIAGNOSIS — R300 Dysuria: Secondary | ICD-10-CM | POA: Insufficient documentation

## 2015-08-13 DIAGNOSIS — R7301 Impaired fasting glucose: Secondary | ICD-10-CM

## 2015-08-13 DIAGNOSIS — Z5181 Encounter for therapeutic drug level monitoring: Secondary | ICD-10-CM

## 2015-08-13 DIAGNOSIS — F410 Panic disorder [episodic paroxysmal anxiety] without agoraphobia: Secondary | ICD-10-CM | POA: Insufficient documentation

## 2015-08-13 DIAGNOSIS — F329 Major depressive disorder, single episode, unspecified: Secondary | ICD-10-CM

## 2015-08-13 LAB — POCT URINALYSIS DIPSTICK
Blood, UA: NEGATIVE
Glucose, UA: NEGATIVE
KETONES UA: NEGATIVE
Leukocytes, UA: NEGATIVE
Nitrite, UA: NEGATIVE
PROTEIN UA: NEGATIVE
SPEC GRAV UA: 1.02
Urobilinogen, UA: 0.2
pH, UA: 5

## 2015-08-13 MED ORDER — FLUOXETINE HCL 20 MG PO CAPS: 20.0000 mg | ORAL_CAPSULE | Freq: Two times a day (BID) | ORAL | Status: DC

## 2015-08-13 MED ORDER — ETODOLAC 400 MG PO TABS: 400.0000 mg | ORAL_TABLET | Freq: Two times a day (BID) | ORAL | Status: DC | PRN

## 2015-08-13 MED ORDER — FLUTICASONE PROPIONATE 50 MCG/ACT NA SUSP: 2.0000 | Freq: Every day | NASAL | Status: DC

## 2015-08-13 MED ORDER — LITHIUM CARBONATE 300 MG PO CAPS: 600.0000 mg | ORAL_CAPSULE | Freq: Two times a day (BID) | ORAL | Status: DC

## 2015-08-13 MED ORDER — HYDROCHLOROTHIAZIDE 12.5 MG PO CAPS: 12.5000 mg | ORAL_CAPSULE | Freq: Every day | ORAL | Status: DC

## 2015-08-13 MED ORDER — BENAZEPRIL HCL 20 MG PO TABS: 20.0000 mg | ORAL_TABLET | Freq: Every day | ORAL | Status: DC

## 2015-08-13 NOTE — Progress Notes (Signed)
BP 140/84 mmHg  Pulse 87  Temp(Src) 98.1 F (36.7 C) (Oral)  Resp 14  Wt 263 lb (119.296 kg)  SpO2 96%   Subjective:    Patient ID: Jaime Freeman, female    DOB: [birth year], 60 y.o.   MRN: [id number]  HPI: Jaime Freeman is a 60 y.o. female  Chief Complaint  Patient presents with  . Medication Refill  . Hypertension    some dizziness, has had some vertigo several timews this year.  . Labs Only   Finished up the sinus issues; thinks she has a bladder infection; cloudy urine, strong smell; no fever; burning with urination; lower abdominal pain; no back pain across lower back  Periodically gets dizziness; if turns too quickly, feels light-headed; has some vertigo from time to time; not sure if it runs in the family; has had it off and on for twenty years; does not want any medicine; she'll let me know if it sticks around; does have allergies; maybe standing up too fast  Prediabetes; she has failed, failed, failed she says; she is being honest; this last year has been bad in terms of cravings; she has to have it, something sweet  She has OA; she has been told that the Lodine can damage her kidneys; she says, "honey if I don't have that, I won't walk"  She has clonazepam 1mg ; only used six pills over the last year; still has four of them; yellow circular, scored R34 on the other side; has terrible panic attacks; can't go to the dentist without them  Depression screen [facility] 2/9 08/13/2015  Decreased Interest 0  Down, Depressed, Hopeless 0  PHQ - 2 Score 0   Relevant past medical, surgical, family and social history reviewed Past Medical History  Diagnosis Date  . Morbid obesity ([place])   . Menopausal state   . Hypertension   . IFG (impaired fasting glucose)   . Vitamin D deficiency disease   . Vitamin B12 deficiency   . Bipolar affective disorder ([place])   . Depression   . Anxiety   . Panic disorder   . OCD (obsessive compulsive disorder)   . Hiatal hernia   . Allergic  rhinitis   . Restrictive lung disease    Past Surgical History  Procedure Laterality Date  . Tonsillectomy    . Dilation and curettage of uterus    . Cesarean section     Social History  Substance Use Topics  . Smoking status: Never Smoker   . Smokeless tobacco: Never Used  . Alcohol Use: No   Interim medical history since our last visit reviewed. Allergies and medications reviewed  Review of Systems Per HPI unless specifically indicated above     Objective:    BP 140/84 mmHg  Pulse 87  Temp(Src) 98.1 F (36.7 C) (Oral)  Resp 14  Wt 263 lb (119.296 kg)  SpO2 96%  Wt Readings from Last 3 Encounters:  08/13/15 263 lb (119.296 kg)  05/08/15 263 lb (119.296 kg)  04/07/15 261 lb (118.389 kg)   body mass index is 48.89 kg/(m^2).  Physical Exam  Constitutional: She appears well-developed and well-nourished. No distress.  Morbidly obese female, NAD  HENT:  Head: Normocephalic and atraumatic.  Eyes: EOM are normal. No scleral icterus.  Neck: No thyromegaly present.  Cardiovascular: Normal rate, regular rhythm and normal heart sounds.   No murmur heard. Pulmonary/Chest: Effort normal and breath sounds normal. No respiratory distress. She has no wheezes.  Abdominal: Soft.  Bowel sounds are normal. She exhibits no distension.  Musculoskeletal: Normal range of motion. She exhibits no edema.  Neurological: She is alert. She exhibits normal muscle tone.  Skin: Skin is warm and dry. She is not diaphoretic. No pallor.  Psychiatric: She has a normal mood and affect. Her behavior is normal. Judgment and thought content normal.   Results for orders placed or performed in visit on 08/13/15  POCT Urinalysis Dipstick  Result Value Ref Range   Color, UA clear    Clarity, UA yellow    Glucose, UA neg    Bilirubin, UA large    Ketones, UA neg    Spec Grav, UA 1.020    Blood, UA neg    pH, UA 5.0    Protein, UA neg    Urobilinogen, UA 0.2    Nitrite, UA neg    Leukocytes, UA  Negative Negative      Assessment & Plan:   Problem List Items Addressed This Visit      Digestive   Fatty liver    Check labs; work on weight loss      Relevant Orders   Comprehensive metabolic panel     Endocrine   IFG (impaired fasting glucose) - Primary    Discussed ER metformin; check A1c today along the Cr; weight loss; I recommended diabetic educator referral, but she politely declined      Relevant Orders   Hgb A1c w/o eAG     Other   Depression    Well-controlled      Relevant Medications   FLUoxetine (PROZAC) 20 MG capsule   Encounter for lithium monitoring    Check Li+      Relevant Orders   Lithium level   Medication monitoring encounter    Check SGPT and Cr and K+      Relevant Orders   Comprehensive metabolic panel   Dyslipidemia    Check lipids      Relevant Orders   Lipid Panel w/o Chol/HDL Ratio   Panic attacks    Will be okay when she calls for a small refill; cannot mix with alcohol or pain medicine or Rx sleep medicine      Relevant Medications   FLUoxetine (PROZAC) 20 MG capsule   Insomnia    3 mg melatonin exact same time of night for 3 weeks      Dysuria   Relevant Orders   POCT Urinalysis Dipstick (Completed)       Follow up plan: Return in about 6 weeks (around 09/24/2015) for weight.  An after-visit summary was printed and given to the patient at [facility].  Please see the patient instructions which may contain other information and recommendations beyond what is mentioned above in the assessment and plan.  Meds ordered this encounter  Medications  . lithium carbonate 300 MG capsule    Sig: Take 2 capsules (600 mg total) by mouth 2 (two) times daily.    Dispense:  120 capsule    Refill:  5  . hydrochlorothiazide (MICROZIDE) 12.5 MG capsule    Sig: Take 1 capsule (12.5 mg total) by mouth daily.    Dispense:  30 capsule    Refill:  5  . fluticasone (FLONASE) 50 MCG/ACT nasal spray    Sig: Place 2 sprays into both  nostrils daily. Reported on 05/08/2015    Dispense:  16 g    Refill:  3  . FLUoxetine (PROZAC) 20 MG capsule    Sig: Take 1  capsule (20 mg total) by mouth 2 (two) times daily.    Dispense:  60 capsule    Refill:  5  . etodolac (LODINE) 400 MG tablet    Sig: Take 1 tablet (400 mg total) by mouth 2 (two) times daily as needed.    Dispense:  60 tablet    Refill:  1  . benazepril (LOTENSIN) 20 MG tablet    Sig: Take 1 tablet (20 mg total) by mouth daily.    Dispense:  30 tablet    Refill:  5    Orders Placed This Encounter  Procedures  . Lithium level  . Hgb A1c w/o eAG  . Comprehensive metabolic panel  . Lipid Panel w/o Chol/HDL Ratio  . POCT Urinalysis Dipstick

## 2015-08-13 NOTE — Assessment & Plan Note (Signed)
Check Li+ 

## 2015-08-13 NOTE — Assessment & Plan Note (Signed)
3 mg melatonin exact same time of night for 3 weeks

## 2015-08-13 NOTE — Assessment & Plan Note (Signed)
Check labs; work on weight loss 

## 2015-08-13 NOTE — Assessment & Plan Note (Signed)
Well controlled 

## 2015-08-13 NOTE — Assessment & Plan Note (Signed)
Will be okay when she calls for a small refill; cannot mix with alcohol or pain medicine or Rx sleep medicine

## 2015-08-13 NOTE — Assessment & Plan Note (Signed)
Check lipids 

## 2015-08-13 NOTE — Patient Instructions (Addendum)
We'll have the labs done today Check out the information at familydoctor.org entitled "What It Takes to Lose Weight" Try to lose between 1-2 pounds per week by taking in fewer calories and burning off more calories You can succeed by limiting portions, limiting foods dense in calories and fat, becoming more active, and drinking 8 glasses of water a day (64 ounces) Don't skip meals, especially breakfast, as skipping meals may alter your metabolism Do not use over-the-counter weight loss pills or gimmicks that claim rapid weight loss A healthy BMI (or body mass index) is between 18.5 and 24.9 You can calculate your ideal BMI at the [place] website ClubMonetize[employer]fr It is sooooo very important to your health for you to lose weight Shoot for one pound of weight loss a week by following the advice at familydoctor.org  Start the metformin (we'll call that in AFTER I get your labs back tomorrow)  Your goal blood pressure is less than 140 mmHg on top. Try to follow the DASH guidelines (DASH stands for Dietary Approaches to Stop Hypertension) Try to limit the sodium in your diet.  Ideally, consume less than 1.5 grams (less than 1,500mg ) per day. Do not add salt when cooking or at the table.  Check the sodium amount on labels when shopping, and choose items lower in sodium when given a choice. Avoid or limit foods that already contain a lot of sodium. Eat a diet rich in fruits and vegetables and whole grains.  Insomnia Insomnia is a sleep disorder that makes it difficult to fall asleep or to stay asleep. Insomnia can cause tiredness (fatigue), low energy, difficulty concentrating, mood swings, and poor performance at work or school.  There are three different ways to classify insomnia:  Difficulty falling asleep.  Difficulty staying asleep.  Waking up too early in the morning. Any type of insomnia can be long-term (chronic) or short-term (acute). Both  are common. Short-term insomnia usually lasts for three months or less. Chronic insomnia occurs at least three times a week for longer than three months. CAUSES  Insomnia may be caused by another condition, situation, or substance, such as:  Anxiety.  Certain medicines.  Gastroesophageal reflux disease (GERD) or other gastrointestinal conditions.  Asthma or other breathing conditions.  Restless legs syndrome, sleep apnea, or other sleep disorders.  Chronic pain.  Menopause. This may include hot flashes.  Stroke.  Abuse of alcohol, tobacco, or illegal drugs.  Depression.  Caffeine.   Neurological disorders, such as Alzheimer disease.  An overactive thyroid (hyperthyroidism). The cause of insomnia may not be known. RISK FACTORS Risk factors for insomnia include:  Gender. Women are more commonly affected than men.  Age. Insomnia is more common as you get older.  Stress. This may involve your professional or personal life.  Income. Insomnia is more common in people with lower income.  Lack of exercise.   Irregular work schedule or night shifts.  Traveling between different time zones. SIGNS AND SYMPTOMS If you have insomnia, trouble falling asleep or trouble staying asleep is the main symptom. This may lead to other symptoms, such as:  Feeling fatigued.  Feeling nervous about going to sleep.  Not feeling rested in the morning.  Having trouble concentrating.  Feeling irritable, anxious, or depressed. TREATMENT  Treatment for insomnia depends on the cause. If your insomnia is caused by an underlying condition, treatment will focus on addressing the condition. Treatment may also include:   Medicines to help you sleep.  Counseling or therapy.  Lifestyle  adjustments. HOME CARE INSTRUCTIONS   Take medicines only as directed by your health care provider.  Keep regular sleeping and waking hours. Avoid naps.  Keep a sleep diary to help you and your health  care provider figure out what could be causing your insomnia. Include:   When you sleep.  When you wake up during the night.  How well you sleep.   How rested you feel the next day.  Any side effects of medicines you are taking.  What you eat and drink.   Make your bedroom a comfortable place where it is easy to fall asleep:  Put up shades or special blackout curtains to block light from outside.  Use a white noise machine to block noise.  Keep the temperature cool.   Exercise regularly as directed by your health care provider. Avoid exercising right before bedtime.  Use relaxation techniques to manage stress. Ask your health care provider to suggest some techniques that may work well for you. These may include:  Breathing exercises.  Routines to release muscle tension.  Visualizing peaceful scenes.  Cut back on alcohol, caffeinated beverages, and cigarettes, especially close to bedtime. These can disrupt your sleep.  Do not overeat or eat spicy foods right before bedtime. This can lead to digestive discomfort that can make it hard for you to sleep.  Limit screen use before bedtime. This includes:  Watching TV.  Using your smartphone, tablet, and computer.  Stick to a routine. This can help you fall asleep faster. Try to do a quiet activity, brush your teeth, and go to bed at the same time each night.  Get out of bed if you are still awake after 15 minutes of trying to sleep. Keep the lights down, but try reading or doing a quiet activity. When you feel sleepy, go back to bed.  Make sure that you drive carefully. Avoid driving if you feel very sleepy.  Keep all follow-up appointments as directed by your health care provider. This is important. SEEK MEDICAL CARE IF:   You are tired throughout the day or have trouble in your daily routine due to sleepiness.  You continue to have sleep problems or your sleep problems get worse. SEEK IMMEDIATE MEDICAL CARE IF:    You have serious thoughts about hurting yourself or someone else.   This information is not intended to replace advice given to you by your health care provider. Make sure you discuss any questions you have with your health care provider.   Document Released: 04/09/2000 Document Revised: 01/01/2015 Document Reviewed: 01/11/2014 Elsevier Interactive Patient Education 2016 Elsevier Inc. Obesity Obesity is defined as having too much total body fat and a body mass index (BMI) of 30 or more. BMI is an estimate of body fat and is calculated from your height and weight. BMI is typically calculated by your health care provider during regular wellness visits. Obesity happens when you consume more calories than you can burn by exercising or performing daily physical tasks. Prolonged obesity can cause major illnesses or emergencies, such as:  Stroke.  Heart disease.  Diabetes.  Cancer.  Arthritis.  High blood pressure (hypertension).  High cholesterol.  Sleep apnea.  Erectile dysfunction.  Infertility problems. CAUSES   Regularly eating unhealthy foods.  Physical inactivity.  Certain disorders, such as an underactive thyroid (hypothyroidism), Cushing's syndrome, and polycystic ovarian syndrome.  Certain medicines, such as steroids, some depression medicines, and antipsychotics.  Genetics.  Lack of sleep. DIAGNOSIS A health care provider can  diagnose obesity after calculating your BMI. Obesity will be diagnosed if your BMI is 30 or higher. There are other methods of measuring obesity levels. Some other methods include measuring your skinfold thickness, your waist circumference, and comparing your hip circumference to your waist circumference. TREATMENT  A healthy treatment program includes some or all of the following:  Long-term dietary changes.  Exercise and physical activity.  Behavioral and lifestyle changes.  Medicine only under the supervision of your health care  provider. Medicines may help, but only if they are used with diet and exercise programs. If your BMI is 40 or higher, your health care provider may recommend specialized surgery or programs to help with weight loss. An unhealthy treatment program includes:  Fasting.  Fad diets.  Supplements and drugs. These choices do not succeed in long-term weight control. HOME CARE INSTRUCTIONS  Exercise and perform physical activity as directed by your health care provider. To increase physical activity, try the following:  Use stairs instead of elevators.  Park farther away from store entrances.  Garden, bike, or walk instead of watching television or using the computer.  Eat healthy, low-calorie foods and drinks on a regular basis. Eat more fruits and vegetables. Use low-calorie cookbooks or take healthy cooking classes.  Limit fast food, sweets, and processed snack foods.  Eat smaller portions.  Keep a daily journal of everything you eat. There are many free websites to help you with this. It may be helpful to measure your foods so you can determine if you are eating the correct portion sizes.  Avoid drinking alcohol. Drink more water and drinks without calories.  Take vitamins and supplements only as recommended by your health care provider.  Weight-loss support groups, Tax adviser, counselors, and stress reduction education can also be very helpful. SEEK IMMEDIATE MEDICAL CARE IF:  You have chest pain or tightness.  You have trouble breathing or feel short of breath.  You have weakness or leg numbness.  You feel confused or have trouble talking.  You have sudden changes in your vision.   This information is not intended to replace advice given to you by your health care provider. Make sure you discuss any questions you have with your health care provider.   Document Released: 05/20/2004 Document Revised: 05/03/2014 Document Reviewed: 05/19/2011 Elsevier Interactive  Patient Education [employer].

## 2015-08-13 NOTE — Assessment & Plan Note (Signed)
Check SGPT and Cr and K+

## 2015-08-13 NOTE — Assessment & Plan Note (Addendum)
Discussed ER metformin; check A1c today along the Cr; weight loss; I recommended diabetic educator referral, but she politely declined

## 2015-08-18 ENCOUNTER — Telehealth: Payer: Self-pay | Admitting: Family Medicine

## 2015-08-18 NOTE — Telephone Encounter (Signed)
I called patient 

## 2015-08-18 NOTE — Telephone Encounter (Signed)
Pt would like a call back from Dr Sanda Klein. [phone], [phone].

## 2015-08-19 NOTE — Telephone Encounter (Signed)
Sorry did not finnish what I was writing, I called Patient and she states having money issue right now and has no ins. So has been unable to get blood work as of yet.  She didn't want you to think she lied to you.

## 2015-08-19 NOTE — Telephone Encounter (Signed)
I spoke with husband; be blessed, understand, labs when she can; praying for them

## 2015-08-25 ENCOUNTER — Telehealth: Payer: Self-pay | Admitting: Family Medicine

## 2015-08-25 MED ORDER — PHENAZOPYRIDINE HCL 100 MG PO TABS: 100.0000 mg | ORAL_TABLET | Freq: Three times a day (TID) | ORAL | Status: AC | PRN

## 2015-08-25 NOTE — Telephone Encounter (Signed)
At last visit patient mentioned about UTI and she has been taking AZO and its not helping. She is requesting that you return her call tonight if possible. If you just want to call her something in send to [facility] (before 6p) or [facility] (after 6p)

## 2015-08-25 NOTE — Telephone Encounter (Signed)
Last urine reviewed; no sign of infection; rx sent for bladder spasms She'll need appt/repeat urine check if still having symptoms please

## 2015-09-09 ENCOUNTER — Telehealth: Payer: Self-pay | Admitting: Family Medicine

## 2015-09-09 NOTE — Telephone Encounter (Signed)
Pt would like meds for UTI to be sent to [facility]. Pt has symptoms of burning while urinatin, cloudy in color

## 2015-09-10 MED ORDER — AMOXICILLIN 500 MG PO CAPS: 500.0000 mg | ORAL_CAPSULE | Freq: Three times a day (TID) | ORAL | Status: DC

## 2015-09-10 NOTE — Telephone Encounter (Signed)
I sent Rx as requested; no recent labs though Please remind patient of outstanding labs; I would have loved to have known her kidney function to choose an antibiotic

## 2015-09-11 NOTE — Telephone Encounter (Signed)
Pt states cannot afford at this time but will try to get in the next week or so?

## 2015-10-13 ENCOUNTER — Other Ambulatory Visit: Payer: Self-pay | Admitting: Family Medicine

## 2015-10-13 ENCOUNTER — Encounter: Payer: Self-pay | Admitting: Family Medicine

## 2015-10-13 ENCOUNTER — Ambulatory Visit (INDEPENDENT_AMBULATORY_CARE_PROVIDER_SITE_OTHER): Payer: Self-pay | Admitting: Family Medicine

## 2015-10-13 VITALS — BP 139/78 | HR 76 | Temp 99.0°F | Ht 62.5 in | Wt 258.0 lb

## 2015-10-13 DIAGNOSIS — R7301 Impaired fasting glucose: Secondary | ICD-10-CM

## 2015-10-13 DIAGNOSIS — F429 Obsessive-compulsive disorder, unspecified: Secondary | ICD-10-CM

## 2015-10-13 DIAGNOSIS — E785 Hyperlipidemia, unspecified: Secondary | ICD-10-CM

## 2015-10-13 DIAGNOSIS — R300 Dysuria: Secondary | ICD-10-CM

## 2015-10-13 DIAGNOSIS — Z5181 Encounter for therapeutic drug level monitoring: Secondary | ICD-10-CM

## 2015-10-13 DIAGNOSIS — I10 Essential (primary) hypertension: Secondary | ICD-10-CM

## 2015-10-13 DIAGNOSIS — K760 Fatty (change of) liver, not elsewhere classified: Secondary | ICD-10-CM

## 2015-10-13 DIAGNOSIS — Z79899 Other long term (current) drug therapy: Secondary | ICD-10-CM

## 2015-10-13 MED ORDER — BENAZEPRIL HCL 20 MG PO TABS: 20.0000 mg | ORAL_TABLET | Freq: Every day | ORAL | Status: DC

## 2015-10-13 MED ORDER — LITHIUM CARBONATE 300 MG PO CAPS: 600.0000 mg | ORAL_CAPSULE | Freq: Two times a day (BID) | ORAL | Status: DC

## 2015-10-13 MED ORDER — FLUOXETINE HCL 20 MG PO CAPS: 20.0000 mg | ORAL_CAPSULE | Freq: Two times a day (BID) | ORAL | Status: DC

## 2015-10-13 MED ORDER — HYDROCHLOROTHIAZIDE 12.5 MG PO CAPS: 12.5000 mg | ORAL_CAPSULE | Freq: Every day | ORAL | Status: DC

## 2015-10-13 NOTE — Assessment & Plan Note (Signed)
Rechecking CMP today. Await results. Call with any concerns. Work on weight loss.

## 2015-10-13 NOTE — Assessment & Plan Note (Signed)
Well controlled on recheck. Continue current regimen. Continue to monitor.

## 2015-10-13 NOTE — Assessment & Plan Note (Signed)
Well controlled on current regimen. Continue current regimen. Continue to monitor.  

## 2015-10-13 NOTE — Assessment & Plan Note (Signed)
Improved! Down to 6.1! Continue to work on diet and exercise. Recheck 6 months. Call with concerns.

## 2015-10-13 NOTE — Progress Notes (Signed)
BP 139/78 mmHg  Pulse 76  Temp(Src) 99 F (37.2 C)  Ht 5' 2.5" (1.588 m)  Wt 258 lb (117.028 kg)  BMI 46.41 kg/m2  SpO2 94%   Subjective:    Patient ID: Jaime Freeman, female    DOB: 07/31/1955, 60 y.o.   MRN: [id number]  HPI: Jaime Freeman is a 60 y.o. female who presents today changing providers due to administrative costs associated with her other practice.  Chief Complaint  Patient presents with  . OCD    Refill on Lithium  . Hyperglycemia  . Hypertension  . Hyperlipidemia   Impaired Fasting Glucose HbA1C:  Lab Results  Component Value Date   HGBA1C 6.4* 12/25/2014   Duration of elevated blood sugar: Unknown Polydipsia: yes Polyuria: no Weight change: yes- has lost about 5lbs Visual disturbance: no Glucose Monitoring: no    Accucheck frequency: Not Checking Diabetic Education: Not Completed- husband has it, so she knows about it Family history of diabetes: no   HYPERTENSION / [place] Satisfied with current treatment? yes Duration of hypertension: chronic BP monitoring frequency: daily BP range: 120s/80s, a couple in the 140s or 150 BP medication side effects: no Past BP meds: HCTZ. Benazepril Duration of hyperlipidemia: chronic Cholesterol medication side effects: yes- peripheral neuropathy with the medication, stopped it several months ago Cholesterol supplements: none Past cholesterol medications: atorvastatin Medication compliance: excellent compliance Aspirin: yes Recent stressors: yes Recurrent headaches: yes- due to issues with her neck Visual changes: yes Palpitations: yes- usually with her anxiety, which is better than it had been  Dyspnea: no Chest pain: no Lower extremity edema: no Dizzy/lightheaded: no  OCD- only needing 1 Rx of her klonopin in a year. Doing much better than she has been Mood status: better Satisfied with current treatment?: yes Symptom severity: moderate  Duration of current treatment : chronic Side effects:  no Medication compliance: excellent compliance Psychotherapy/counseling: no  Previous psychiatric medications: 2 lithium and a 20mg  prozac at night.  Depressed mood: yes- occasionally blue, not depressed Anxious mood: yes Anhedonia: no Significant weight loss or gain: no Insomnia: no  Fatigue: yes Feelings of worthlessness or guilt: no Impaired concentration/indecisiveness: no Suicidal ideations: no Hopelessness: no Crying spells: no Depression screen [facility] 2/9 10/13/2015 08/13/2015  Decreased Interest 0 0  Down, Depressed, Hopeless 0 0  PHQ - 2 Score 0 0   Had shots in her knees last week, legs doing much better.   Had a UTI a couple of months ago, would like to make sure she doesn't have one today. Still burning a little bit, but no pain. Not drinking a lot of water.   Relevant past medical, surgical, family and social history reviewed and updated as indicated. Interim medical history since our last visit reviewed. Allergies and medications reviewed and updated.  Review of Systems  Constitutional: Negative.   Respiratory: Negative.   Cardiovascular: Negative.   Psychiatric/Behavioral: Negative.     Per HPI unless specifically indicated above     Objective:    BP 139/78 mmHg  Pulse 76  Temp(Src) 99 F (37.2 C)  Ht 5' 2.5" (1.588 m)  Wt 258 lb (117.028 kg)  BMI 46.41 kg/m2  SpO2 94%  Wt Readings from Last 3 Encounters:  10/13/15 258 lb (117.028 kg)  08/13/15 263 lb (119.296 kg)  05/08/15 263 lb (119.296 kg)    Physical Exam  Constitutional: She is oriented to person, place, and time. She appears well-developed and well-nourished. No distress.  HENT:  Head: Normocephalic  and atraumatic.  Right Ear: Hearing normal.  Left Ear: Hearing normal.  Nose: Nose normal.  Eyes: Conjunctivae and lids are normal. Right eye exhibits no discharge. Left eye exhibits no discharge. No scleral icterus.  Cardiovascular: Normal rate, regular rhythm, normal heart sounds and intact  distal pulses.  Exam reveals no gallop and no friction rub.   No murmur heard. Pulmonary/Chest: Effort normal and breath sounds normal. No respiratory distress. She has no wheezes. She has no rales. She exhibits no tenderness.  Musculoskeletal: Normal range of motion.  Neurological: She is alert and oriented to person, place, and time.  Skin: Skin is warm, dry and intact. No rash noted. No erythema. No pallor.  Psychiatric: She has a normal mood and affect. Her speech is normal and behavior is normal. Judgment and thought content normal. Cognition and memory are normal.  Nursing note and vitals reviewed.   Results for orders placed or performed in visit on 08/13/15  POCT Urinalysis Dipstick  Result Value Ref Range   Color, UA clear    Clarity, UA yellow    Glucose, UA neg    Bilirubin, UA large    Ketones, UA neg    Spec Grav, UA 1.020    Blood, UA neg    pH, UA 5.0    Protein, UA neg    Urobilinogen, UA 0.2    Nitrite, UA neg    Leukocytes, UA Negative Negative      Assessment & Plan:   Problem List Items Addressed This Visit      Cardiovascular and Mediastinum   Hypertension    Well controlled on recheck. Continue current regimen. Continue to monitor.         Digestive   Fatty liver    Rechecking CMP today. Await results. Call with any concerns. Work on weight loss.         Endocrine   IFG (impaired fasting glucose) - Primary    Improved! Down to 6.1! Continue to work on diet and exercise. Recheck 6 months. Call with concerns.         Other   OCD (obsessive compulsive disorder)    Well controlled on current regimen. Continue current regimen. Continue to monitor.       Encounter for lithium monitoring    Rechecking levels. Continue to monitor.       Dyslipidemia    Borderline. Elevated from previous visit. Bad side effects from the statin. Will hold on it for now. Continue diet and exercise. Recheck 6 months.       Dysuria    +UA without symptoms. Await  culture.       Relevant Orders   UA/M w/rflx Culture, Routine       Follow up plan: Return in about 6 months (around 04/13/2016) for follow up chol/BP/IFG.

## 2015-10-13 NOTE — Assessment & Plan Note (Signed)
Borderline. Elevated from previous visit. Bad side effects from the statin. Will hold on it for now. Continue diet and exercise. Recheck 6 months.

## 2015-10-13 NOTE — Assessment & Plan Note (Signed)
Rechecking levels. Continue to monitor.

## 2015-10-13 NOTE — Assessment & Plan Note (Signed)
+  UA without symptoms. Await culture.

## 2015-10-14 ENCOUNTER — Encounter: Payer: Self-pay | Admitting: Family Medicine

## 2015-10-14 LAB — COMPREHENSIVE METABOLIC PANEL
A/G RATIO: 1.7 (ref 1.2–2.2)
ALBUMIN: 4.1 g/dL (ref 3.5–5.5)
ALT: 16 IU/L (ref 0–32)
AST: 16 IU/L (ref 0–40)
Alkaline Phosphatase: 62 IU/L (ref 39–117)
BILIRUBIN TOTAL: 0.4 mg/dL (ref 0.0–1.2)
BUN / CREAT RATIO: 17 (ref 9–23)
BUN: 12 mg/dL (ref 6–24)
CALCIUM: 9.8 mg/dL (ref 8.7–10.2)
CO2: 22 mmol/L (ref 18–29)
Chloride: 101 mmol/L (ref 96–106)
Creatinine, Ser: 0.72 mg/dL (ref 0.57–1.00)
GFR, EST AFRICAN AMERICAN: 107 mL/min/{1.73_m2} (ref >59)
GFR, EST NON AFRICAN AMERICAN: 93 mL/min/{1.73_m2} (ref >59)
Globulin, Total: 2.4 g/dL (ref 1.5–4.5)
Glucose: 98 mg/dL (ref 65–99)
POTASSIUM: 4.8 mmol/L (ref 3.5–5.2)
Sodium: 141 mmol/L (ref 134–144)
TOTAL PROTEIN: 6.5 g/dL (ref 6.0–8.5)

## 2015-10-14 LAB — LITHIUM LEVEL: Lithium Lvl: 0.6 mmol/L (ref 0.6–1.2)

## 2015-10-15 LAB — MICROSCOPIC EXAMINATION

## 2015-10-15 LAB — UA/M W/RFLX CULTURE, ROUTINE
Bilirubin, UA: POSITIVE — AB
Glucose, UA: NEGATIVE
Ketones, UA: NEGATIVE
NITRITE UA: NEGATIVE
PH UA: 5.5 (ref 5.0–7.5)
PROTEIN UA: NEGATIVE
RBC UA: NEGATIVE
Specific Gravity, UA: 1.015 (ref 1.005–1.030)
UUROB: 0.2 mg/dL (ref 0.2–1.0)

## 2015-10-15 LAB — LIPID PANEL PICCOLO, WAIVED
CHOL/HDL RATIO PICCOLO,WAIVE: 3.7 mg/dL
CHOLESTEROL PICCOLO, WAIVED: 214 mg/dL — AB (ref <200)
HDL CHOL PICCOLO, WAIVED: 58 mg/dL (ref >59)
LDL Chol Calc Piccolo Waived: 124 mg/dL — ABNORMAL HIGH (ref <100)
TRIGLYCERIDES PICCOLO,WAIVED: 154 mg/dL — AB (ref <150)
VLDL Chol Calc Piccolo,Waive: 31 mg/dL — ABNORMAL HIGH (ref <30)

## 2015-10-15 LAB — URINE CULTURE, REFLEX

## 2015-10-15 LAB — BAYER DCA HB A1C WAIVED: HB A1C: 6.1 % (ref <7.0)

## 2015-11-18 ENCOUNTER — Other Ambulatory Visit: Payer: Self-pay | Admitting: Family Medicine

## 2015-12-20 ENCOUNTER — Other Ambulatory Visit: Payer: Self-pay | Admitting: Family Medicine

## 2015-12-22 ENCOUNTER — Other Ambulatory Visit: Payer: Self-pay | Admitting: Family Medicine

## 2016-01-30 ENCOUNTER — Other Ambulatory Visit: Payer: Self-pay | Admitting: Family Medicine

## 2016-02-19 ENCOUNTER — Encounter: Payer: Self-pay | Admitting: Family Medicine

## 2016-02-19 ENCOUNTER — Ambulatory Visit (INDEPENDENT_AMBULATORY_CARE_PROVIDER_SITE_OTHER): Payer: Self-pay | Admitting: Family Medicine

## 2016-02-19 VITALS — BP 144/78 | HR 64 | Temp 98.6°F | Wt 258.0 lb

## 2016-02-19 DIAGNOSIS — J069 Acute upper respiratory infection, unspecified: Secondary | ICD-10-CM

## 2016-02-19 MED ORDER — ALBUTEROL SULFATE HFA 108 (90 BASE) MCG/ACT IN AERS: 2.0000 | INHALATION_SPRAY | RESPIRATORY_TRACT | 3 refills | Status: DC | PRN

## 2016-02-19 MED ORDER — AMOXICILLIN-POT CLAVULANATE 875-125 MG PO TABS: 1.0000 | ORAL_TABLET | Freq: Two times a day (BID) | ORAL | 0 refills | Status: DC

## 2016-02-19 NOTE — Progress Notes (Signed)
   BP (!) 144/78   Pulse 64   Temp 98.6 F (37 C)   Wt 258 lb (117 kg)   SpO2 96%   BMI 46.44 kg/m    Subjective:    Patient ID: Jaime Freeman, female    DOB: 1956-04-12, 60 y.o.   MRN: [id number]  HPI: Jaime Freeman is a 60 y.o. female  Chief Complaint  Patient presents with  . Sinusitis    x 2 days, head and chest congestion, runny nose, sore throat, cough.  . Medication Refill    She needs a refill on Benazepril and Etodolac.   Patient presents with 2-3 weeks of sinus pressure and congestion that has worsened significantly the past 2 days. She is now having headaches, dental pain, sore throat, cough, and feels like the congestion is moving down into her chest. Having chest tightness and some resistance with deep breaths recently. Taking her albuterol more often than normal since this all started. Tried some essential oils to clear her congestion, but nothing has helped. Allergic to most decongestants so has not been taking any other otc products.   Relevant past medical, surgical, family and social history reviewed and updated as indicated. Interim medical history since our last visit reviewed. Allergies and medications reviewed and updated.  Review of Systems  Constitutional: Negative for chills, diaphoresis and fever.  HENT: Positive for congestion, postnasal drip, sinus pressure and sore throat.   Eyes: Negative.   Respiratory: Positive for cough and chest tightness.   Cardiovascular: Negative.   Gastrointestinal: Negative.   Genitourinary: Negative.   Musculoskeletal: Negative.   Skin: Negative.   Neurological: Negative.   Psychiatric/Behavioral: Negative.     Per HPI unless specifically indicated above     Objective:    BP (!) 144/78   Pulse 64   Temp 98.6 F (37 C)   Wt 258 lb (117 kg)   SpO2 96%   BMI 46.44 kg/m   Wt Readings from Last 3 Encounters:  02/19/16 258 lb (117 kg)  10/13/15 258 lb (117 kg)  08/13/15 263 lb (119.3 kg)    Physical Exam    Constitutional: She is oriented to person, place, and time. She appears well-developed and well-nourished. No distress.  HENT:  Head: Normocephalic and atraumatic.  Nose: Nose normal.  Moderate cerumen in b/l EACs TMs benign Oropharynx erythematous  Eyes: Conjunctivae are normal. Pupils are equal, round, and reactive to light. No scleral icterus.  Neck: Normal range of motion. Neck supple.  Cardiovascular: Normal rate, regular rhythm and normal heart sounds.   Pulmonary/Chest: Effort normal and breath sounds normal. No respiratory distress. She has no wheezes. She has no rales.  Musculoskeletal: Normal range of motion.  Lymphadenopathy:    She has no cervical adenopathy.  Neurological: She is alert and oriented to person, place, and time.  Skin: Skin is warm and dry.  Psychiatric: She has a normal mood and affect. Her behavior is normal.  Nursing note and vitals reviewed.      Assessment & Plan:   Problem List Items Addressed This Visit    None    Visit Diagnoses    Upper respiratory tract infection, unspecified type    -  Primary   Given duration and hx of restrictive lung dz with worsening SOB, will treat with augmentin, albuterol inhaler, and flonase. Follow up if no improvement       Follow up plan: Return for as scheduled .

## 2016-02-19 NOTE — Patient Instructions (Signed)
Follow up as scheduled.  

## 2016-03-04 ENCOUNTER — Telehealth: Payer: Self-pay | Admitting: Family Medicine

## 2016-03-04 NOTE — Telephone Encounter (Signed)
Routing to provider  

## 2016-03-04 NOTE — Telephone Encounter (Signed)
She will need to be seen to get her urine checked, and at that time we can reassess her persistent URI sxs

## 2016-03-04 NOTE — Telephone Encounter (Signed)
Pt saw Apolonio Schneiders 02/19/16 and was given amoxicillin-clavulanate (AUGMENTIN) 875-125 MG tablet and she now has a UTI. She says she is still congested as well and would like to know what she should do.

## 2016-03-04 NOTE — Telephone Encounter (Signed)
Patient notified, appointment scheduled

## 2016-03-05 ENCOUNTER — Encounter: Payer: Self-pay | Admitting: Family Medicine

## 2016-03-05 ENCOUNTER — Ambulatory Visit (INDEPENDENT_AMBULATORY_CARE_PROVIDER_SITE_OTHER): Payer: Self-pay | Admitting: Family Medicine

## 2016-03-05 VITALS — BP 131/78 | HR 76 | Temp 98.0°F | Wt 256.0 lb

## 2016-03-05 DIAGNOSIS — N390 Urinary tract infection, site not specified: Secondary | ICD-10-CM

## 2016-03-05 MED ORDER — SULFAMETHOXAZOLE-TRIMETHOPRIM 800-160 MG PO TABS: 1.0000 | ORAL_TABLET | Freq: Two times a day (BID) | ORAL | 0 refills | Status: DC

## 2016-03-05 NOTE — Patient Instructions (Signed)
Follow up as needed

## 2016-03-05 NOTE — Progress Notes (Signed)
   BP 131/78   Pulse 76   Temp 98 F (36.7 C)   Wt 256 lb (116.1 kg)   SpO2 97%   BMI 46.08 kg/m    Subjective:    Patient ID: Jaime Freeman, female    DOB: Mar 08, 1956, 60 y.o.   MRN: [id number]  HPI: Jaime Freeman is a 60 y.o. female  Chief Complaint  Patient presents with  . URI    she is improving but still having chest congestion, productive cough thick yellow mucus, raw throat, stuffy head.  . Urinary Tract Infection    since Wed. Pain, urgency, frequency. States she always has these symptoms after taking antibiotics.   Patient presents with dysuria, urgency, and frequency x 2 days. States she just finished the augmentin recently given for URI sxs and frequently gets UTIs after taking an antibiotic for whatever reason. Taking AZO with minimal relief. No fever, chills, back pain, hematuria, N/V. Still some lingering sinus pressure and congestion as well as some chest tightness but otherwise feeling some better from URI.   Relevant past medical, surgical, family and social history reviewed and updated as indicated. Interim medical history since our last visit reviewed. Allergies and medications reviewed and updated.  Review of Systems  Constitutional: Negative.   HENT: Positive for congestion and sinus pressure.   Eyes: Negative.   Respiratory: Positive for chest tightness.   Cardiovascular: Negative.   Gastrointestinal: Negative.   Genitourinary: Positive for dysuria, frequency and urgency.  Musculoskeletal: Negative.   Neurological: Negative.   Psychiatric/Behavioral: Negative.     Per HPI unless specifically indicated above     Objective:    BP 131/78   Pulse 76   Temp 98 F (36.7 C)   Wt 256 lb (116.1 kg)   SpO2 97%   BMI 46.08 kg/m   Wt Readings from Last 3 Encounters:  03/05/16 256 lb (116.1 kg)  02/19/16 258 lb (117 kg)  10/13/15 258 lb (117 kg)    Physical Exam  Constitutional: She is oriented to person, place, and time. She appears  well-developed and well-nourished. No distress.  HENT:  Head: Atraumatic.  Eyes: Conjunctivae are normal. Pupils are equal, round, and reactive to light. No scleral icterus.  Neck: Normal range of motion. Neck supple.  Cardiovascular: Normal rate.   Pulmonary/Chest: Effort normal. No respiratory distress.  Abdominal: Soft. Bowel sounds are normal. There is no tenderness.  Musculoskeletal: Normal range of motion.  No CVA tenderness b/l  Neurological: She is alert and oriented to person, place, and time.  Skin: Skin is warm and dry.  Psychiatric: She has a normal mood and affect. Her behavior is normal.  Nursing note and vitals reviewed.     Assessment & Plan:   Problem List Items Addressed This Visit    None    Visit Diagnoses    Acute lower UTI    -  Primary   Bactrim sent, continue AZO prn for symptom relief. Recommended a good probiotic regimen and plenty of water daily   Relevant Medications   sulfamethoxazole-trimethoprim (BACTRIM DS,SEPTRA DS) 800-160 MG tablet   Other Relevant Orders   UA/M w/rflx Culture, Routine (STAT)    Sample of Dulera given with good instructions on use until URI sxs fully resolve given her restrictive lung dz and chest tighness. Continue albuterol as needed also.  Follow up plan: Return if symptoms worsen or fail to improve.

## 2016-03-08 ENCOUNTER — Telehealth: Payer: Self-pay | Admitting: Family Medicine

## 2016-03-08 LAB — UA/M W/RFLX CULTURE, ROUTINE
GLUCOSE, UA: NEGATIVE
KETONES UA: NEGATIVE
Nitrite, UA: POSITIVE — AB
SPEC GRAV UA: 1.015 (ref 1.005–1.030)
Urobilinogen, Ur: 1 mg/dL (ref 0.2–1.0)
pH, UA: 7 (ref 5.0–7.5)

## 2016-03-08 LAB — MICROSCOPIC EXAMINATION: WBC, UA: >30 /hpf — AB (ref 0–?)

## 2016-03-08 LAB — URINE CULTURE, REFLEX

## 2016-03-08 NOTE — Telephone Encounter (Signed)
Pt feels like she may be dehydrated and would like a call back regarding what she can do.

## 2016-03-08 NOTE — Telephone Encounter (Signed)
She should drink plenty of fluids. If she has something going on where she can't keep fluids down, she should let us know.

## 2016-03-08 NOTE — Telephone Encounter (Signed)
Patient notified

## 2016-03-08 NOTE — Telephone Encounter (Signed)
Please call pt and let her know that her urine culture grew E. Coli that is susceptible to the bactrim that she is currently on. Complete entire course, if still symptomatic let us know

## 2016-03-08 NOTE — Telephone Encounter (Signed)
Patient notified and to call if she's still symptomatic.

## 2016-03-24 ENCOUNTER — Telehealth: Payer: Self-pay | Admitting: Family Medicine

## 2016-03-24 MED ORDER — AMOXICILLIN-POT CLAVULANATE 875-125 MG PO TABS: 1.0000 | ORAL_TABLET | Freq: Two times a day (BID) | ORAL | 0 refills | Status: DC

## 2016-03-24 NOTE — Telephone Encounter (Signed)
Patient notified

## 2016-03-24 NOTE — Telephone Encounter (Signed)
Routing to provider  

## 2016-03-24 NOTE — Telephone Encounter (Signed)
Sent in augmentin, if no improvement after that I'll need to see her.

## 2016-03-24 NOTE — Telephone Encounter (Signed)
Pt called stated she was better and then got sick again. Wants to know if something can be called in for her. Pharm is [facility]. Please call with any issues or concerns. Thanks.

## 2016-04-27 ENCOUNTER — Telehealth: Payer: Self-pay | Admitting: Family Medicine

## 2016-04-27 MED ORDER — ETODOLAC 400 MG PO TABS: 400.0000 mg | ORAL_TABLET | Freq: Two times a day (BID) | ORAL | 3 refills | Status: DC | PRN

## 2016-04-27 NOTE — Telephone Encounter (Signed)
Rx sent to her pharmacy 

## 2016-04-27 NOTE — Telephone Encounter (Signed)
Pt would like a refill on etodolac (LODINE) 400 MG tablet sent to [facility].

## 2016-04-29 ENCOUNTER — Ambulatory Visit: Payer: Self-pay | Admitting: Family Medicine

## 2016-05-05 ENCOUNTER — Ambulatory Visit: Payer: Self-pay | Admitting: Family Medicine

## 2016-05-13 ENCOUNTER — Ambulatory Visit: Payer: Self-pay | Admitting: Family Medicine

## 2016-05-27 ENCOUNTER — Telehealth: Payer: Self-pay | Admitting: Family Medicine

## 2016-05-27 MED ORDER — ETODOLAC 400 MG PO TABS: 400.0000 mg | ORAL_TABLET | Freq: Two times a day (BID) | ORAL | 3 refills | Status: DC | PRN

## 2016-05-27 MED ORDER — BENAZEPRIL HCL 20 MG PO TABS: 20.0000 mg | ORAL_TABLET | Freq: Every day | ORAL | 5 refills | Status: DC

## 2016-05-27 MED ORDER — HYDROCHLOROTHIAZIDE 12.5 MG PO CAPS: 12.5000 mg | ORAL_CAPSULE | Freq: Every day | ORAL | 5 refills | Status: DC

## 2016-05-27 MED ORDER — FLUTICASONE PROPIONATE 50 MCG/ACT NA SUSP: 2.0000 | Freq: Every day | NASAL | 3 refills | Status: DC

## 2016-05-27 MED ORDER — FLUOXETINE HCL 20 MG PO CAPS: 20.0000 mg | ORAL_CAPSULE | Freq: Two times a day (BID) | ORAL | 5 refills | Status: DC

## 2016-05-27 MED ORDER — LITHIUM CARBONATE 300 MG PO CAPS: 600.0000 mg | ORAL_CAPSULE | Freq: Two times a day (BID) | ORAL | 5 refills | Status: DC

## 2016-05-27 NOTE — Telephone Encounter (Signed)
Rxs sent to her pharmacy. OK to wait until end of March

## 2016-05-27 NOTE — Telephone Encounter (Signed)
Routing to provider. Does patient need to come in? Last saw by Apolonio Schneiders 03/05/16 for UTI

## 2016-05-27 NOTE — Telephone Encounter (Signed)
Patient would like to know if she can get refills on her medication during the flu peak due to her resistance to sickness.  She said she would come in for f/u if necessary.  Thank You  Santiago Glad

## 2016-05-28 NOTE — Telephone Encounter (Signed)
Patient notified

## 2016-06-11 ENCOUNTER — Ambulatory Visit (INDEPENDENT_AMBULATORY_CARE_PROVIDER_SITE_OTHER): Payer: Self-pay | Admitting: Family Medicine

## 2016-06-11 ENCOUNTER — Telehealth: Payer: Self-pay | Admitting: Family Medicine

## 2016-06-11 ENCOUNTER — Ambulatory Visit: Payer: Self-pay | Admitting: Family Medicine

## 2016-06-11 ENCOUNTER — Encounter: Payer: Self-pay | Admitting: Family Medicine

## 2016-06-11 VITALS — BP 144/79 | HR 80 | Temp 98.6°F | Ht 62.0 in | Wt 245.2 lb

## 2016-06-11 DIAGNOSIS — J069 Acute upper respiratory infection, unspecified: Secondary | ICD-10-CM

## 2016-06-11 DIAGNOSIS — F419 Anxiety disorder, unspecified: Secondary | ICD-10-CM

## 2016-06-11 MED ORDER — AMOXICILLIN 875 MG PO TABS
875.0000 mg | ORAL_TABLET | Freq: Two times a day (BID) | ORAL | 0 refills | Status: DC
Start: 2016-06-11 — End: 2016-08-02

## 2016-06-11 MED ORDER — CLONAZEPAM 1 MG PO TABS: 1.0000 mg | ORAL_TABLET | Freq: Every day | ORAL | 0 refills | Status: DC | PRN

## 2016-06-11 NOTE — Telephone Encounter (Signed)
Patient came in for appointment with Apolonio Schneiders. I asked patient if she still needed to speak with Dr. Wynetta Emery and she no as the situation has been handled.

## 2016-06-11 NOTE — Patient Instructions (Signed)
Follow up in 1 month   

## 2016-06-11 NOTE — Progress Notes (Signed)
   BP (!) 144/79 (BP Location: Left Arm, Patient Position: Sitting, Cuff Size: Large)   Pulse 80   Temp 98.6 F (37 C)   Ht 5\' 2"  (1.575 m)   Wt 245 lb 3.2 oz (111.2 kg)   SpO2 98%   BMI 44.85 kg/m    Subjective:    Patient ID: Jaime Freeman, female    DOB: 11-Mar-1956, 61 y.o.   MRN: [id number]  HPI: Jaime Freeman is a 61 y.o. female  Chief Complaint  Patient presents with  . Cough    x's one week. Productive.   . Congestion  . Shortness of Breath   Patient presents with 1 week of productive cough, chest tightness, congestion, and some occasional SOB. Denies fever, chills, aches, sore throat, ear pain. Not currently taking anything OTC for symptoms.  Albuterol helping some, and using essential oils which help. No sick contacts.   Also needs klonopin refill - last refill about 2 years ago. Takes about 5 or less yearly, only for severe panic episodes or highly stressful events. Has been having a lot of life stress lately and wanting to get a refill.   Relevant past medical, surgical, family and social history reviewed and updated as indicated. Interim medical history since our last visit reviewed. Allergies and medications reviewed and updated.  Review of Systems  Constitutional: Negative.   HENT: Positive for congestion.   Eyes: Negative.   Respiratory: Positive for cough, chest tightness, shortness of breath and wheezing.   Cardiovascular: Negative.   Gastrointestinal: Negative.   Genitourinary: Negative.   Musculoskeletal: Negative.   Neurological: Negative.   Psychiatric/Behavioral: Negative.     Per HPI unless specifically indicated above     Objective:    BP (!) 144/79 (BP Location: Left Arm, Patient Position: Sitting, Cuff Size: Large)   Pulse 80   Temp 98.6 F (37 C)   Ht 5\' 2"  (1.575 m)   Wt 245 lb 3.2 oz (111.2 kg)   SpO2 98%   BMI 44.85 kg/m   Wt Readings from Last 3 Encounters:  06/11/16 245 lb 3.2 oz (111.2 kg)  03/05/16 256 lb (116.1 kg)    02/19/16 258 lb (117 kg)    Physical Exam  Constitutional: She is oriented to person, place, and time. She appears well-developed and well-nourished. No distress.  HENT:  Head: Atraumatic.  Eyes: Conjunctivae are normal. Pupils are equal, round, and reactive to light.  Neck: Normal range of motion. Neck supple.  Cardiovascular: Normal rate and normal heart sounds.   Pulmonary/Chest: Effort normal. No respiratory distress. She has wheezes (mild wheezes).  Musculoskeletal: Normal range of motion.  Neurological: She is alert and oriented to person, place, and time.  Skin: Skin is warm and dry.  Psychiatric: She has a normal mood and affect. Her behavior is normal.      Assessment & Plan:   Problem List Items Addressed This Visit    None    Visit Diagnoses    Upper respiratory tract infection, unspecified type    -  Primary   Nebulizer done today with good relief. Amoxicillin sent, try tessalon perles, essential oils prn. Follow up if worsening or no improvement.     States only amoxicillin is tolerable with her system, and allergic to sudafed and mucinex.   Follow up plan: Return in about 4 weeks (around 07/09/2016) for regular follow up.

## 2016-06-11 NOTE — Assessment & Plan Note (Signed)
Refilled klonopin, should last well over a year for her.

## 2016-07-19 ENCOUNTER — Ambulatory Visit: Payer: Self-pay | Admitting: Family Medicine

## 2016-08-02 ENCOUNTER — Ambulatory Visit (INDEPENDENT_AMBULATORY_CARE_PROVIDER_SITE_OTHER): Payer: Self-pay | Admitting: Family Medicine

## 2016-08-02 ENCOUNTER — Encounter: Payer: Self-pay | Admitting: Family Medicine

## 2016-08-02 VITALS — BP 110/74 | HR 65 | Temp 98.6°F | Resp 17 | Ht 62.0 in | Wt 247.0 lb

## 2016-08-02 DIAGNOSIS — E559 Vitamin D deficiency, unspecified: Secondary | ICD-10-CM

## 2016-08-02 DIAGNOSIS — F410 Panic disorder [episodic paroxysmal anxiety] without agoraphobia: Secondary | ICD-10-CM

## 2016-08-02 DIAGNOSIS — K760 Fatty (change of) liver, not elsewhere classified: Secondary | ICD-10-CM

## 2016-08-02 DIAGNOSIS — Z79899 Other long term (current) drug therapy: Secondary | ICD-10-CM

## 2016-08-02 DIAGNOSIS — E538 Deficiency of other specified B group vitamins: Secondary | ICD-10-CM

## 2016-08-02 DIAGNOSIS — E785 Hyperlipidemia, unspecified: Secondary | ICD-10-CM

## 2016-08-02 DIAGNOSIS — F419 Anxiety disorder, unspecified: Secondary | ICD-10-CM

## 2016-08-02 DIAGNOSIS — F3342 Major depressive disorder, recurrent, in full remission: Secondary | ICD-10-CM

## 2016-08-02 DIAGNOSIS — F429 Obsessive-compulsive disorder, unspecified: Secondary | ICD-10-CM

## 2016-08-02 DIAGNOSIS — I10 Essential (primary) hypertension: Secondary | ICD-10-CM

## 2016-08-02 DIAGNOSIS — Z5181 Encounter for therapeutic drug level monitoring: Secondary | ICD-10-CM

## 2016-08-02 DIAGNOSIS — R7301 Impaired fasting glucose: Secondary | ICD-10-CM

## 2016-08-02 LAB — MICROALBUMIN, URINE WAIVED

## 2016-08-02 MED ORDER — ALBUTEROL SULFATE HFA 108 (90 BASE) MCG/ACT IN AERS: 2.0000 | INHALATION_SPRAY | RESPIRATORY_TRACT | 3 refills | Status: DC | PRN

## 2016-08-02 MED ORDER — LITHIUM CARBONATE 300 MG PO CAPS: 600.0000 mg | ORAL_CAPSULE | Freq: Two times a day (BID) | ORAL | 1 refills | Status: DC

## 2016-08-02 MED ORDER — BENAZEPRIL HCL 20 MG PO TABS: 20.0000 mg | ORAL_TABLET | Freq: Every day | ORAL | 1 refills | Status: DC

## 2016-08-02 MED ORDER — HYDROCHLOROTHIAZIDE 12.5 MG PO CAPS: 12.5000 mg | ORAL_CAPSULE | Freq: Every day | ORAL | 1 refills | Status: DC

## 2016-08-02 MED ORDER — FLUTICASONE PROPIONATE 50 MCG/ACT NA SUSP: 2.0000 | Freq: Every day | NASAL | 12 refills | Status: DC

## 2016-08-02 MED ORDER — ETODOLAC 400 MG PO TABS: 400.0000 mg | ORAL_TABLET | Freq: Two times a day (BID) | ORAL | 3 refills | Status: DC | PRN

## 2016-08-02 MED ORDER — FLUOXETINE HCL 20 MG PO CAPS: 20.0000 mg | ORAL_CAPSULE | Freq: Two times a day (BID) | ORAL | 1 refills | Status: DC

## 2016-08-02 NOTE — Assessment & Plan Note (Signed)
Stable on current regimen. Continue current regimen. Refill given. Recheck 6 months.

## 2016-08-02 NOTE — Progress Notes (Signed)
BP 110/74 (BP Location: Left Arm, Patient Position: Sitting, Cuff Size: Large)   Pulse 65   Temp 98.6 F (37 C) (Oral)   Resp 17   Ht 5\' 2"  (1.575 m)   Wt 247 lb (112 kg)   SpO2 97%   BMI 45.18 kg/m    Subjective:    Patient ID: Jaime Freeman, female    DOB: 1956-01-25, 61 y.o.   MRN: [id number]  HPI: Jaime Freeman is a 61 y.o. female  Chief Complaint  Patient presents with  . Nasal Congestion   Impaired Fasting Glucose HbA1C:  Lab Results  Component Value Date   HGBA1C 6.4 (H) 12/25/2014   Duration of elevated blood sugar:  Polydipsia: no Polyuria: no Weight change: no Visual disturbance: no Glucose Monitoring: no Diabetic Education: Not Completed Family history of diabetes: yes  HYPERTENSION Hypertension status: controlled  Satisfied with current treatment? yes Duration of hypertension: chronic BP monitoring frequency:  not checking BP range:  BP medication side effects:  no Medication compliance: excellent compliance Previous BP meds: hctz, benazepril Aspirin: yes Recurrent headaches: no Visual changes: no Palpitations: yes- with anxiety Dyspnea: yes Chest pain: no Lower extremity edema: no Dizzy/lightheaded: no  ANXIETY/STRESS Duration:controlled Anxious mood: no  Excessive worrying: no Irritability: no  Sweating: no Nausea: no Palpitations:no Hyperventilation: no Panic attacks: no Agoraphobia: no  Obscessions/compulsions: no Depressed mood: no Depression screen [facility] 2/9 10/13/2015 08/13/2015  Decreased Interest 0 0  Down, Depressed, Hopeless 0 0  PHQ - 2 Score 0 0   Anhedonia: no Weight changes: no Insomnia: no   Hypersomnia: no Fatigue/loss of energy: no Feelings of worthlessness: no Feelings of guilt: no Impaired concentration/indecisiveness: no Suicidal ideations: no  Crying spells: no Recent Stressors/Life Changes: no   Relationship problems: no   Family stress: no     Financial stress: no    Job stress: no    Recent  death/loss: no  Relevant past medical, surgical, family and social history reviewed and updated as indicated. Interim medical history since our last visit reviewed. Allergies and medications reviewed and updated.  Review of Systems  Constitutional: Negative.   Respiratory: Negative.   Cardiovascular: Negative.   Psychiatric/Behavioral: Negative.     Per HPI unless specifically indicated above     Objective:    BP 110/74 (BP Location: Left Arm, Patient Position: Sitting, Cuff Size: Large)   Pulse 65   Temp 98.6 F (37 C) (Oral)   Resp 17   Ht 5\' 2"  (1.575 m)   Wt 247 lb (112 kg)   SpO2 97%   BMI 45.18 kg/m   Wt Readings from Last 3 Encounters:  08/02/16 247 lb (112 kg)  06/11/16 245 lb 3.2 oz (111.2 kg)  03/05/16 256 lb (116.1 kg)    Physical Exam  Constitutional: She is oriented to person, place, and time. She appears well-developed and well-nourished. No distress.  HENT:  Head: Normocephalic and atraumatic.  Right Ear: Hearing, tympanic membrane, external ear and ear canal normal.  Left Ear: Hearing, tympanic membrane, external ear and ear canal normal.  Nose: Mucosal edema and rhinorrhea present.  Mouth/Throat: Uvula is midline, oropharynx is clear and moist and mucous membranes are normal. No oropharyngeal exudate.  Eyes: Conjunctivae, EOM and lids are normal. Pupils are equal, round, and reactive to light. Right eye exhibits no discharge. Left eye exhibits no discharge. No scleral icterus.  Neck: Normal range of motion. Neck supple. No JVD present. No tracheal deviation present. No thyromegaly  present.  Pulmonary/Chest: Effort normal. No stridor. No respiratory distress.  Musculoskeletal: Normal range of motion.  Lymphadenopathy:    She has no cervical adenopathy.  Neurological: She is alert and oriented to person, place, and time.  Skin: Skin is intact. No rash noted. She is not diaphoretic.  Psychiatric: She has a normal mood and affect. Her speech is normal and  behavior is normal. Judgment and thought content normal. Cognition and memory are normal.    Results for orders placed or performed in visit on 08/02/16  Bayer DCA Hb A1c Waived  Result Value Ref Range   Bayer DCA Hb A1c Waived 5.7 <7.0 %  Microalbumin, Urine Waived  Result Value Ref Range   Microalb, Ur Waived WILL FOLLOW    Creatinine, Urine Waived WILL FOLLOW    Microalb/Creat Ratio WILL FOLLOW   UA/M w/rflx Culture, Routine  Result Value Ref Range   Specific Gravity, UA WILL FOLLOW    pH, UA WILL FOLLOW    Color, UA WILL FOLLOW    Appearance Ur WILL FOLLOW    Leukocytes, UA WILL FOLLOW    Protein, UA WILL FOLLOW    Glucose, UA WILL FOLLOW    Ketones, UA WILL FOLLOW    RBC, UA WILL FOLLOW    Bilirubin, UA WILL FOLLOW    Urobilinogen, Ur WILL FOLLOW    Nitrite, UA WILL FOLLOW    Microscopic Examination WILL FOLLOW    Microscopic Examination WILL FOLLOW    Urinalysis Reflex WILL FOLLOW       Assessment & Plan:   Problem List Items Addressed This Visit      Cardiovascular and Mediastinum   Hypertension    Under good control. Continue current regimen. Continue to monitor. Call with any concerns.       Relevant Medications   hydrochlorothiazide (MICROZIDE) 12.5 MG capsule   benazepril (LOTENSIN) 20 MG tablet   Other Relevant Orders   Lipid Panel w/o Chol/HDL Ratio   Microalbumin, Urine Waived (Completed)   TSH   UA/M w/rflx Culture, Routine (Completed)   Comprehensive metabolic panel     Digestive   Fatty liver    Rechecking levels today. Call with any concerns. Continue to monitor.       Relevant Orders   CBC with Differential/Platelet   Lipid Panel w/o Chol/HDL Ratio   TSH   UA/M w/rflx Culture, Routine (Completed)   Comprehensive metabolic panel     Endocrine   IFG (impaired fasting glucose) - Primary    Congratulated patient on A1c of 5.7! Continue current regimen. Continue to monitor.       Relevant Orders   Bayer DCA Hb A1c Waived (Completed)    Lipid Panel w/o Chol/HDL Ratio   Microalbumin, Urine Waived (Completed)   UA/M w/rflx Culture, Routine (Completed)   Comprehensive metabolic panel     Other   Vitamin D deficiency disease    Continue current regimen. Rechecking levels today. Call with any concerns.       Relevant Orders   UA/M w/rflx Culture, Routine (Completed)   VITAMIN D 25 Hydroxy (Vit-D Deficiency, Fractures)   Comprehensive metabolic panel   Vitamin D17 deficiency    Continue current regimen. Rechecking levels today. Call with any concerns.       Relevant Orders   CBC with Differential/Platelet   UA/M w/rflx Culture, Routine (Completed)   B12 and Folate Panel   Comprehensive metabolic panel   Depression    Stable on current regimen. Continue current regimen.  Refill given. Recheck 6 months.      Relevant Medications   FLUoxetine (PROZAC) 20 MG capsule   Anxiety    Stable on current regimen. Continue current regimen. Refill given. Recheck 6 months.      Relevant Medications   FLUoxetine (PROZAC) 20 MG capsule   Other Relevant Orders   TSH   UA/M w/rflx Culture, Routine (Completed)   Comprehensive metabolic panel   OCD (obsessive compulsive disorder)    Stable on current regimen. Continue current regimen. Refill given. Recheck 6 months.      Encounter for lithium monitoring    Rechecking levels today. Await results. Continue to monitor.       Dyslipidemia    Rechecking levels. Call with any concerns. Continue to monitor.       Panic attacks    Stable on current regimen. Continue current regimen. Refill given. Recheck 6 months.      Relevant Medications   FLUoxetine (PROZAC) 20 MG capsule    Other Visit Diagnoses    Long-term use of high-risk medication       Relevant Orders   Lithium level       Follow up plan: Return in about 6 months (around 02/01/2017) for Physical.

## 2016-08-02 NOTE — Assessment & Plan Note (Signed)
Continue current regimen. Rechecking levels today. Call with any concerns.

## 2016-08-02 NOTE — Assessment & Plan Note (Signed)
Rechecking levels. Call with any concerns. Continue to monitor.

## 2016-08-02 NOTE — Assessment & Plan Note (Signed)
Under good control. Continue current regimen. Continue to monitor. Call with any concerns. 

## 2016-08-02 NOTE — Assessment & Plan Note (Signed)
Rechecking levels today. Await results. Continue to monitor.  

## 2016-08-02 NOTE — Assessment & Plan Note (Signed)
Rechecking levels today. Call with any concerns. Continue to monitor.  

## 2016-08-02 NOTE — Assessment & Plan Note (Signed)
Congratulated patient on A1c of 5.7! Continue current regimen. Continue to monitor.

## 2016-08-03 ENCOUNTER — Telehealth: Payer: Self-pay | Admitting: Family Medicine

## 2016-08-03 LAB — COMPREHENSIVE METABOLIC PANEL
A/G RATIO: 1.8 (ref 1.2–2.2)
ALBUMIN: 4.2 g/dL (ref 3.5–5.5)
ALK PHOS: 57 IU/L (ref 39–117)
ALT: 14 IU/L (ref 0–32)
AST: 14 IU/L (ref 0–40)
BUN / CREAT RATIO: 19 (ref 9–23)
BUN: 14 mg/dL (ref 6–24)
Bilirubin Total: 0.4 mg/dL (ref 0.0–1.2)
CO2: 27 mmol/L (ref 18–29)
Calcium: 9.8 mg/dL (ref 8.7–10.2)
Chloride: 100 mmol/L (ref 96–106)
Creatinine, Ser: 0.73 mg/dL (ref 0.57–1.00)
GFR calc Af Amer: 104 mL/min/{1.73_m2} (ref >59)
GFR calc non Af Amer: 90 mL/min/{1.73_m2} (ref >59)
GLOBULIN, TOTAL: 2.4 g/dL (ref 1.5–4.5)
Glucose: 102 mg/dL — ABNORMAL HIGH (ref 65–99)
POTASSIUM: 4.9 mmol/L (ref 3.5–5.2)
SODIUM: 139 mmol/L (ref 134–144)
Total Protein: 6.6 g/dL (ref 6.0–8.5)

## 2016-08-03 LAB — CBC WITH DIFFERENTIAL/PLATELET
BASOS: 0 %
Basophils Absolute: 0 10*3/uL (ref 0.0–0.2)
EOS (ABSOLUTE): 0.3 10*3/uL (ref 0.0–0.4)
EOS: 4 %
HEMATOCRIT: 37.2 % (ref 34.0–46.6)
Hemoglobin: 12.6 g/dL (ref 11.1–15.9)
IMMATURE GRANS (ABS): 0 10*3/uL (ref 0.0–0.1)
IMMATURE GRANULOCYTES: 0 %
LYMPHS: 30 %
Lymphocytes Absolute: 2.7 10*3/uL (ref 0.7–3.1)
MCH: 32.3 pg (ref 26.6–33.0)
MCHC: 33.9 g/dL (ref 31.5–35.7)
MCV: 95 fL (ref 79–97)
Monocytes Absolute: 0.6 10*3/uL (ref 0.1–0.9)
Monocytes: 6 %
NEUTROS PCT: 60 %
Neutrophils Absolute: 5.2 10*3/uL (ref 1.4–7.0)
PLATELETS: 379 10*3/uL (ref 150–379)
RBC: 3.9 x10E6/uL (ref 3.77–5.28)
RDW: 13.1 % (ref 12.3–15.4)
WBC: 8.8 10*3/uL (ref 3.4–10.8)

## 2016-08-03 LAB — LIPID PANEL W/O CHOL/HDL RATIO
Cholesterol, Total: 225 mg/dL — ABNORMAL HIGH (ref 100–199)
HDL: 54 mg/dL (ref >39)
LDL Calculated: 140 mg/dL — ABNORMAL HIGH (ref 0–99)
TRIGLYCERIDES: 155 mg/dL — AB (ref 0–149)
VLDL CHOLESTEROL CAL: 31 mg/dL (ref 5–40)

## 2016-08-03 LAB — TSH: TSH: 1.9 u[IU]/mL (ref 0.450–4.500)

## 2016-08-03 LAB — B12 AND FOLATE PANEL
Folate: 6.7 ng/mL (ref >3.0)
VITAMIN B 12: 298 pg/mL (ref 232–1245)

## 2016-08-03 LAB — LITHIUM LEVEL: LITHIUM LVL: 0.8 mmol/L (ref 0.6–1.2)

## 2016-08-03 LAB — VITAMIN D 25 HYDROXY (VIT D DEFICIENCY, FRACTURES): VIT D 25 HYDROXY: 16.3 ng/mL — AB (ref 30.0–100.0)

## 2016-08-03 NOTE — Telephone Encounter (Signed)
Patient wants lab results. They are back if you could look at them for this patient Thank you!

## 2016-08-03 NOTE — Telephone Encounter (Signed)
Patient would like someone to call her with her lab results.  thanks

## 2016-08-04 NOTE — Telephone Encounter (Signed)
Called and discussed results with patient. She will start 2000 IU D3 OTC and we will recheck it next visit.

## 2016-08-07 LAB — UA/M W/RFLX CULTURE, ROUTINE

## 2016-08-07 LAB — BAYER DCA HB A1C WAIVED: HB A1C (BAYER DCA - WAIVED): 5.7 % (ref <7.0)

## 2016-09-09 ENCOUNTER — Encounter: Payer: Self-pay | Admitting: Family Medicine

## 2016-09-09 ENCOUNTER — Other Ambulatory Visit: Payer: Self-pay | Admitting: Family Medicine

## 2016-09-09 ENCOUNTER — Other Ambulatory Visit: Payer: Self-pay

## 2016-09-09 ENCOUNTER — Ambulatory Visit (INDEPENDENT_AMBULATORY_CARE_PROVIDER_SITE_OTHER): Payer: Self-pay | Admitting: Family Medicine

## 2016-09-09 VITALS — BP 114/71 | HR 63 | Temp 97.9°F | Wt 246.2 lb

## 2016-09-09 DIAGNOSIS — N289 Disorder of kidney and ureter, unspecified: Secondary | ICD-10-CM

## 2016-09-09 DIAGNOSIS — I10 Essential (primary) hypertension: Secondary | ICD-10-CM

## 2016-09-09 DIAGNOSIS — R7301 Impaired fasting glucose: Secondary | ICD-10-CM

## 2016-09-09 DIAGNOSIS — J069 Acute upper respiratory infection, unspecified: Secondary | ICD-10-CM

## 2016-09-09 MED ORDER — AMOXICILLIN-POT CLAVULANATE 875-125 MG PO TABS
1.0000 | ORAL_TABLET | Freq: Two times a day (BID) | ORAL | 0 refills | Status: DC
Start: 2016-09-09 — End: 2016-11-22

## 2016-09-09 NOTE — Progress Notes (Signed)
   BP 114/71 (BP Location: Left Arm, Patient Position: Sitting, Cuff Size: Large)   Pulse 63   Temp 97.9 F (36.6 C)   Wt 246 lb 3.2 oz (111.7 kg)   SpO2 100%   BMI 45.03 kg/m    Subjective:    Patient ID: Jaime Freeman, female    DOB: [birth year], 61 y.o.   MRN: [id number]  HPI: Jaime Freeman is a 61 y.o. female  Chief Complaint  Patient presents with  . Chest Congestion    x's 1 month. Tightness in chest.   . Cough  . Sinusitis   Patient presents with 1 month of productive cough, chest tightness, SOB. Waking up in the morning choking on phlegm. Has been trying flonase and her albuterol, good relief with albuterol but nothing else is helping. Has intolerances to mucinex and antihistamines. Hx of restrictive lung dz. No fevers, chills, sweats, aches, CP, N/V. Denies sick contacts.    Relevant past medical, surgical, family and social history reviewed and updated as indicated. Interim medical history since our last visit reviewed. Allergies and medications reviewed and updated.  Review of Systems  Constitutional: Negative.   HENT: Negative.   Eyes: Negative.   Respiratory: Positive for cough, chest tightness and shortness of breath.   Cardiovascular: Negative.   Gastrointestinal: Negative.   Genitourinary: Negative.   Musculoskeletal: Negative.   Neurological: Negative.   Psychiatric/Behavioral: Negative.    Per HPI unless specifically indicated above     Objective:    BP 114/71 (BP Location: Left Arm, Patient Position: Sitting, Cuff Size: Large)   Pulse 63   Temp 97.9 F (36.6 C)   Wt 246 lb 3.2 oz (111.7 kg)   SpO2 100%   BMI 45.03 kg/m   Wt Readings from Last 3 Encounters:  09/09/16 246 lb 3.2 oz (111.7 kg)  08/02/16 247 lb (112 kg)  06/11/16 245 lb 3.2 oz (111.2 kg)    Physical Exam  Constitutional: She is oriented to person, place, and time. She appears well-developed and well-nourished.  HENT:  Head: Atraumatic.  Right Ear: External ear normal.    Left Ear: External ear normal.  Nose: Nose normal.  Mouth/Throat: Oropharynx is clear and moist. No oropharyngeal exudate.  Eyes: Conjunctivae are normal. Pupils are equal, round, and reactive to light.  Neck: Normal range of motion. Neck supple.  Cardiovascular: Normal rate and normal heart sounds.   Pulmonary/Chest: Effort normal. No respiratory distress. She has wheezes (mild diffuse).  Musculoskeletal: Normal range of motion.  Neurological: She is alert and oriented to person, place, and time.  Skin: Skin is warm and dry.  Psychiatric: She has a normal mood and affect. Her behavior is normal.  Nursing note and vitals reviewed.     Assessment & Plan:   Problem List Items Addressed This Visit    None    Visit Diagnoses    Upper respiratory tract infection, unspecified type    -  Primary   Pt states only antibiotic she tolerates is augmentin. Rx sent, continue flonase BID. Sinus rinses, humidifier, inhaler prn. F/u if worsening or no improvement    Discussed adding singulair for allergy sxs but pt concerned she won't tolerate that well either so refusing for now.    Follow up plan: Return for as scheduled.

## 2016-09-09 NOTE — Patient Instructions (Signed)
Follow up if no improvement 

## 2016-09-10 LAB — UA/M W/RFLX CULTURE, ROUTINE
GLUCOSE, UA: NEGATIVE
Ketones, UA: NEGATIVE
LEUKOCYTES UA: NEGATIVE
Nitrite, UA: NEGATIVE
PH UA: 7 (ref 5.0–7.5)
PROTEIN UA: NEGATIVE
RBC, UA: NEGATIVE
Specific Gravity, UA: 1.015 (ref 1.005–1.030)
Urobilinogen, Ur: 0.2 mg/dL (ref 0.2–1.0)

## 2016-09-10 LAB — MICROALBUMIN, URINE WAIVED
Creatinine, Urine Waived: 10 mg/dL (ref 10–300)
Microalb, Ur Waived: 10 mg/L (ref 0–19)

## 2016-09-13 ENCOUNTER — Encounter: Payer: Self-pay | Admitting: Family Medicine

## 2016-09-22 ENCOUNTER — Telehealth: Payer: Self-pay | Admitting: Family Medicine

## 2016-09-22 NOTE — Telephone Encounter (Signed)
Patient would like to speak to someone regarding UTI she has she thinks from a medication she is taking.  Thank You  [phone]  [phone]

## 2016-09-23 MED ORDER — FLUCONAZOLE 150 MG PO TABS: 150.0000 mg | ORAL_TABLET | Freq: Once | ORAL | 0 refills | Status: AC

## 2016-09-23 NOTE — Telephone Encounter (Signed)
Called patient, no answer, left message for patient to return my call.  

## 2016-09-23 NOTE — Telephone Encounter (Signed)
Diflucan sent. Can also use some OTC monistat cream

## 2016-09-23 NOTE — Telephone Encounter (Signed)
Left message on patients identified voicemail.

## 2016-09-23 NOTE — Telephone Encounter (Signed)
Routing to provider  

## 2016-09-23 NOTE — Telephone Encounter (Signed)
Patient was seen by Jaime Freeman last week and put on an antibiotic.  She developed a yeast infection. She said it was almost gone but she wants to make sure it is completely gone. She is wanting to know what could be recommended to make sure it is completely gone.  Please advise.  Thanks

## 2016-11-22 ENCOUNTER — Ambulatory Visit (INDEPENDENT_AMBULATORY_CARE_PROVIDER_SITE_OTHER): Payer: Self-pay | Admitting: Family Medicine

## 2016-11-22 ENCOUNTER — Encounter: Payer: Self-pay | Admitting: Family Medicine

## 2016-11-22 VITALS — BP 132/72 | HR 74 | Temp 98.9°F | Wt 239.0 lb

## 2016-11-22 DIAGNOSIS — J069 Acute upper respiratory infection, unspecified: Secondary | ICD-10-CM

## 2016-11-22 MED ORDER — AMOXICILLIN-POT CLAVULANATE 875-125 MG PO TABS: 1.0000 | ORAL_TABLET | Freq: Two times a day (BID) | ORAL | 0 refills | Status: DC

## 2016-11-22 MED ORDER — ALBUTEROL SULFATE (2.5 MG/3ML) 0.083% IN NEBU
2.5000 mg | INHALATION_SOLUTION | Freq: Once | RESPIRATORY_TRACT | Status: AC
  Administered 2016-11-22: 2.5 mg via RESPIRATORY_TRACT

## 2016-11-22 NOTE — Progress Notes (Signed)
   BP 132/72   Pulse 74   Temp 98.9 F (37.2 C)   Wt 239 lb (108.4 kg)   SpO2 98%   BMI 43.71 kg/m    Subjective:    Patient ID: Jaime Freeman, female    DOB: [birth year], 61 y.o.   MRN: [id number]  HPI: Jaime Freeman is a 61 y.o. female  Chief Complaint  Patient presents with  . URI    x 2 weeks, chest congestion, sob, some what productive cough, some nasal congestion, fatigued. No fever, no sore throat.    Patient with 2 weeks of congestion, productive cough, fatigue, and worsening SOB and chest tightness. Denies fever, chills, CP, ear pain, facial pain. Has been using flonase, albuterol, and nasal saline. Unable to tolerate antihistamines, mucinex, and most cough syrups aside from delsym. Hx of restrictive lung dz.   Relevant past medical, surgical, family and social history reviewed and updated as indicated. Interim medical history since our last visit reviewed. Allergies and medications reviewed and updated.  Review of Systems  Constitutional: Positive for fatigue.  HENT: Positive for congestion.   Respiratory: Positive for cough, chest tightness and shortness of breath.   Cardiovascular: Negative.   Gastrointestinal: Negative.   Genitourinary: Negative.   Musculoskeletal: Negative.   Neurological: Negative.   Psychiatric/Behavioral: Negative.    Per HPI unless specifically indicated above     Objective:    BP 132/72   Pulse 74   Temp 98.9 F (37.2 C)   Wt 239 lb (108.4 kg)   SpO2 98%   BMI 43.71 kg/m   Wt Readings from Last 3 Encounters:  11/22/16 239 lb (108.4 kg)  09/09/16 246 lb 3.2 oz (111.7 kg)  08/02/16 247 lb (112 kg)    Physical Exam  Constitutional: She is oriented to person, place, and time. She appears well-developed and well-nourished. No distress.  HENT:  Head: Atraumatic.  Eyes: Pupils are equal, round, and reactive to light. Conjunctivae are normal.  Neck: Normal range of motion. Neck supple.  Cardiovascular: Normal rate and normal  heart sounds.   Pulmonary/Chest: Effort normal. No respiratory distress. She has wheezes (mild wheezes).  Musculoskeletal: Normal range of motion.  Neurological: She is alert and oriented to person, place, and time.  Skin: Skin is warm and dry.  Psychiatric: She has a normal mood and affect. Her behavior is normal.  Nursing note and vitals reviewed.     Assessment & Plan:   Problem List Items Addressed This Visit    None    Visit Diagnoses    Upper respiratory tract infection, unspecified type    -  Primary   Nebulizer tx given with good relief. Augmentin sent, delsym, albuterol, and humidifiers prn. Follow up if worsening or no improvement   Relevant Medications   albuterol (PROVENTIL) (2.5 MG/3ML) 0.083% nebulizer solution 2.5 mg (Completed)   Other Relevant Orders   PR DEMO &/OR EVAL,PT USE,AEROSOL DEVICE    Discussed again about trying singulair as it is non-histamine option for allergy control, pt declines. Also discussed starting a steroid inhaler and pt declines this as steroids cause her to feel jittery. Reviewed that it likely would not cause issue as it isn't systemic absorption but pt still not wanting to risk it. Will revisit if sxs become more frequent  Follow up plan: Return if symptoms worsen or fail to improve.

## 2016-11-22 NOTE — Patient Instructions (Signed)
Follow up if no improvement 

## 2016-12-06 ENCOUNTER — Telehealth: Payer: Self-pay | Admitting: Family Medicine

## 2016-12-06 ENCOUNTER — Encounter: Payer: Self-pay | Admitting: Family Medicine

## 2016-12-06 ENCOUNTER — Ambulatory Visit (INDEPENDENT_AMBULATORY_CARE_PROVIDER_SITE_OTHER): Payer: Self-pay | Admitting: Family Medicine

## 2016-12-06 VITALS — BP 131/72 | HR 75 | Temp 98.4°F | Wt 237.0 lb

## 2016-12-06 DIAGNOSIS — R300 Dysuria: Secondary | ICD-10-CM

## 2016-12-06 MED ORDER — FLUCONAZOLE 150 MG PO TABS: 150.0000 mg | ORAL_TABLET | Freq: Once | ORAL | 0 refills | Status: AC

## 2016-12-06 MED ORDER — CIPROFLOXACIN HCL 250 MG PO TABS: 250.0000 mg | ORAL_TABLET | Freq: Two times a day (BID) | ORAL | 0 refills | Status: DC

## 2016-12-06 NOTE — Telephone Encounter (Signed)
Needs appt

## 2016-12-06 NOTE — Telephone Encounter (Signed)
Routing to provider  

## 2016-12-06 NOTE — Telephone Encounter (Signed)
Left message that she needs to schedule an appt.

## 2016-12-06 NOTE — Patient Instructions (Signed)
Follow up if no improvement 

## 2016-12-06 NOTE — Telephone Encounter (Signed)
Pt called and stated she would like to know if she could have something sent to [facility] for a bladder infection.

## 2016-12-06 NOTE — Progress Notes (Signed)
   BP 131/72   Pulse 75   Temp 98.4 F (36.9 C)   Wt 237 lb (107.5 kg)   SpO2 98%   BMI 43.35 kg/m    Subjective:    Patient ID: Jaime Freeman, female    DOB: 06/12/1955, 61 y.o.   MRN: [id number]  HPI: Jaime Freeman is a 61 y.o. female  Chief Complaint  Patient presents with  . Urinary Tract Infection    started this morning, pressure, burning, feels swollen, dribble of urine, frequency.   Discussed with pt at previous visit that I would send over cipro if she ended up with UTI sxs after taking her abx for URI as this is her typical pattern. Also noting clumpy discharge and vaginal irritation/itching.   Relevant past medical, surgical, family and social history reviewed and updated as indicated. Interim medical history since our last visit reviewed. Allergies and medications reviewed and updated.  Review of Systems  All other systems reviewed and are negative.   Per HPI unless specifically indicated above     Objective:    BP 131/72   Pulse 75   Temp 98.4 F (36.9 C)   Wt 237 lb (107.5 kg)   SpO2 98%   BMI 43.35 kg/m   Wt Readings from Last 3 Encounters:  12/06/16 237 lb (107.5 kg)  11/22/16 239 lb (108.4 kg)  09/09/16 246 lb 3.2 oz (111.7 kg)    Physical Exam  Constitutional: She is oriented to person, place, and time. No distress.  HENT:  Head: Atraumatic.  Eyes: Pupils are equal, round, and reactive to light. Conjunctivae are normal.  Cardiovascular: Normal rate and normal heart sounds.   Pulmonary/Chest: Effort normal. No respiratory distress.  Abdominal: There is tenderness (mild suprapubic ttp).  Musculoskeletal: Normal range of motion.  Neurological: She is alert and oriented to person, place, and time.  Skin: Skin is warm and dry.  Psychiatric: She has a normal mood and affect. Her behavior is normal.  Nursing note and vitals reviewed.   Results for orders placed or performed in visit on 09/09/16  UA/M w/rflx Culture, Routine  Result Value  Ref Range   Specific Gravity, UA 1.015 1.005 - 1.030   pH, UA 7.0 5.0 - 7.5   Color, UA Yellow Yellow   Appearance Ur Clear Clear   Leukocytes, UA Negative Negative   Protein, UA Negative Negative/Trace   Glucose, UA Negative Negative   Ketones, UA Negative Negative   RBC, UA Negative Negative   Bilirubin, UA 3+ (A) Negative   Urobilinogen, Ur 0.2 0.2 - 1.0 mg/dL   Nitrite, UA Negative Negative  Microalbumin, Urine Waived  Result Value Ref Range   Microalb, Ur Waived 10 0 - 19 mg/L   Creatinine, Urine Waived 10 10 - 300 mg/dL   Microalb/Creat Ratio 30-300 (H) <30 mg/g      Assessment & Plan:   Problem List Items Addressed This Visit    None    Visit Diagnoses    Dysuria    -  Primary   Frequently gets UTIs and yeast infections after taking abx. Will treat with diflucan and cipro, f/u if no improvement for testing       Follow up plan: Return if symptoms worsen or fail to improve.

## 2016-12-06 NOTE — Telephone Encounter (Signed)
Patient would like to speak with Amy regarding the bladder medication. She states it is on her records that she can get whenever she needs the medication.  Jaime Freeman  [phone]  Thank you

## 2016-12-06 NOTE — Telephone Encounter (Signed)
Per Apolonio Schneiders, she still has to be seen. No note on her chart. She scheduled an appt.

## 2017-01-13 ENCOUNTER — Telehealth: Payer: Self-pay

## 2017-01-13 NOTE — Telephone Encounter (Signed)
Called to r/s appt from 02/02/2017 with Dr.Johnson due to scheduling conflicts. Patient VM box is full.

## 2017-01-13 NOTE — Telephone Encounter (Signed)
Patient returned call. Explained to her scheduling change. Patient rescheduled for 02/21/2017.

## 2017-02-02 ENCOUNTER — Encounter: Payer: Self-pay | Admitting: Family Medicine

## 2017-02-21 ENCOUNTER — Encounter: Payer: Self-pay | Admitting: Family Medicine

## 2017-03-28 ENCOUNTER — Encounter: Payer: Self-pay | Admitting: Family Medicine

## 2017-04-29 ENCOUNTER — Encounter: Payer: Self-pay | Admitting: Family Medicine

## 2017-05-11 ENCOUNTER — Telehealth: Payer: Self-pay | Admitting: Family Medicine

## 2017-05-11 ENCOUNTER — Other Ambulatory Visit: Payer: Self-pay

## 2017-05-11 DIAGNOSIS — R300 Dysuria: Secondary | ICD-10-CM

## 2017-05-11 NOTE — Telephone Encounter (Signed)
Routing to provider for result.

## 2017-05-11 NOTE — Telephone Encounter (Signed)
Urine dropped off

## 2017-05-11 NOTE — Telephone Encounter (Signed)
Pt. Stopped by office to get urin specimen results or make sure urin was in for testing. Informed pt. That once urin results are in the office will give her a call provider her with the results.  Please Advise.  Thank you.

## 2017-05-11 NOTE — Telephone Encounter (Signed)
Order in.

## 2017-05-11 NOTE — Telephone Encounter (Signed)
Can we check on the results?  Should they be done by now?

## 2017-05-11 NOTE — Telephone Encounter (Signed)
Routed to wrong provider, routing to correct provider now.

## 2017-05-11 NOTE — Telephone Encounter (Signed)
I need to make sure she does have a UTI- she doesn't need to have an appointment, but can she please come in and leave me a urine.

## 2017-05-11 NOTE — Telephone Encounter (Signed)
FYI in case she shows up this afternoon

## 2017-05-11 NOTE — Telephone Encounter (Signed)
Copied from [place] [phone]. Topic: Quick Communication - See Telephone Encounter >> May 11, 2017  8:48 AM Aurelio Brash B wrote: CRM for notification. See Telephone encounter for:  Pt states she has a UTI and does not have the funds for an office visit and wants to know if Dr Wynetta Emery can call her in a rx to [facility], [place], [place] - [phone] (Phone) [phone] (Fax)   05/11/17.

## 2017-05-11 NOTE — Telephone Encounter (Signed)
Spoke with patient, she will try to get here before 12 if not she will come in the morning.

## 2017-05-12 MED ORDER — CIPROFLOXACIN HCL 500 MG PO TABS: 500.0000 mg | ORAL_TABLET | Freq: Two times a day (BID) | ORAL | 0 refills | Status: DC

## 2017-05-12 NOTE — Telephone Encounter (Signed)
Pt calling back for an update on her lab results.   Please f/u with pt.

## 2017-05-12 NOTE — Telephone Encounter (Signed)
Has this patient been notified of the results yet, I do not see anything in the chart.

## 2017-05-12 NOTE — Telephone Encounter (Signed)
Please let her know that she does have a UTI. I've sent an antibiotic to her pharmacy. Thanks!

## 2017-05-12 NOTE — Telephone Encounter (Signed)
Patient notified

## 2017-05-13 ENCOUNTER — Telehealth: Payer: Self-pay | Admitting: Family Medicine

## 2017-05-13 LAB — URINE CULTURE, REFLEX

## 2017-05-13 LAB — UA/M W/RFLX CULTURE, ROUTINE
Bilirubin, UA: NEGATIVE
KETONES UA: NEGATIVE
Nitrite, UA: POSITIVE — AB
SPEC GRAV UA: 1.015 (ref 1.005–1.030)
Urobilinogen, Ur: 1 mg/dL (ref 0.2–1.0)
pH, UA: 7 (ref 5.0–7.5)

## 2017-05-13 LAB — MICROSCOPIC EXAMINATION

## 2017-05-13 NOTE — Telephone Encounter (Signed)
Patient notified

## 2017-05-13 NOTE — Telephone Encounter (Signed)
Please let her know that the bacteria in her urine should be treated by her antibiotic and she should let me know if she needs anything.

## 2017-05-20 ENCOUNTER — Encounter: Payer: Self-pay | Admitting: Family Medicine

## 2017-05-24 ENCOUNTER — Encounter: Payer: Self-pay | Admitting: Family Medicine

## 2017-05-30 ENCOUNTER — Telehealth: Payer: Self-pay | Admitting: Family Medicine

## 2017-05-30 MED ORDER — BENAZEPRIL HCL 20 MG PO TABS: 20.0000 mg | ORAL_TABLET | Freq: Every day | ORAL | 1 refills | Status: DC

## 2017-05-30 NOTE — Telephone Encounter (Signed)
Copied from [place]. Topic: Quick Communication - Rx Refill/Question >> May 30, 2017  2:04 PM Lolita Rieger, [place] wrote: Medication: benazepril 20mg  HCTZ 12.5 mg   Has the patient contacted their pharmacy?yes   (Agent: If no, request that the patient contact the pharmacy for the refill.)   Preferred Pharmacy (with phone number or street name): [facility]   Agent: Please be advised that RX refills may take up to 3 business days. We ask that you follow-up with your pharmacy.

## 2017-06-02 ENCOUNTER — Other Ambulatory Visit: Payer: Self-pay | Admitting: Family Medicine

## 2017-06-02 MED ORDER — HYDROCHLOROTHIAZIDE 12.5 MG PO CAPS: 12.5000 mg | ORAL_CAPSULE | Freq: Every day | ORAL | 1 refills | Status: DC

## 2017-06-07 ENCOUNTER — Encounter: Payer: Self-pay | Admitting: Family Medicine

## 2017-06-07 ENCOUNTER — Ambulatory Visit (INDEPENDENT_AMBULATORY_CARE_PROVIDER_SITE_OTHER): Payer: Self-pay | Admitting: Family Medicine

## 2017-06-07 VITALS — BP 133/78 | HR 68 | Temp 98.6°F | Ht 62.0 in | Wt 234.2 lb

## 2017-06-07 DIAGNOSIS — F410 Panic disorder [episodic paroxysmal anxiety] without agoraphobia: Secondary | ICD-10-CM

## 2017-06-07 DIAGNOSIS — Z5181 Encounter for therapeutic drug level monitoring: Secondary | ICD-10-CM

## 2017-06-07 DIAGNOSIS — R7301 Impaired fasting glucose: Secondary | ICD-10-CM

## 2017-06-07 DIAGNOSIS — Z79899 Other long term (current) drug therapy: Secondary | ICD-10-CM

## 2017-06-07 DIAGNOSIS — E559 Vitamin D deficiency, unspecified: Secondary | ICD-10-CM

## 2017-06-07 DIAGNOSIS — N181 Chronic kidney disease, stage 1: Secondary | ICD-10-CM | POA: Insufficient documentation

## 2017-06-07 DIAGNOSIS — Z0000 Encounter for general adult medical examination without abnormal findings: Secondary | ICD-10-CM

## 2017-06-07 DIAGNOSIS — I129 Hypertensive chronic kidney disease with stage 1 through stage 4 chronic kidney disease, or unspecified chronic kidney disease: Secondary | ICD-10-CM

## 2017-06-07 DIAGNOSIS — G4700 Insomnia, unspecified: Secondary | ICD-10-CM

## 2017-06-07 DIAGNOSIS — G63 Polyneuropathy in diseases classified elsewhere: Secondary | ICD-10-CM

## 2017-06-07 DIAGNOSIS — Z0001 Encounter for general adult medical examination with abnormal findings: Secondary | ICD-10-CM

## 2017-06-07 DIAGNOSIS — F419 Anxiety disorder, unspecified: Secondary | ICD-10-CM

## 2017-06-07 DIAGNOSIS — F3342 Major depressive disorder, recurrent, in full remission: Secondary | ICD-10-CM

## 2017-06-07 DIAGNOSIS — K760 Fatty (change of) liver, not elsewhere classified: Secondary | ICD-10-CM

## 2017-06-07 DIAGNOSIS — M170 Bilateral primary osteoarthritis of knee: Secondary | ICD-10-CM

## 2017-06-07 DIAGNOSIS — F429 Obsessive-compulsive disorder, unspecified: Secondary | ICD-10-CM

## 2017-06-07 DIAGNOSIS — R1310 Dysphagia, unspecified: Secondary | ICD-10-CM

## 2017-06-07 DIAGNOSIS — E538 Deficiency of other specified B group vitamins: Secondary | ICD-10-CM

## 2017-06-07 DIAGNOSIS — Z124 Encounter for screening for malignant neoplasm of cervix: Secondary | ICD-10-CM

## 2017-06-07 DIAGNOSIS — E785 Hyperlipidemia, unspecified: Secondary | ICD-10-CM

## 2017-06-07 LAB — UA/M W/RFLX CULTURE, ROUTINE
BILIRUBIN UA: NEGATIVE
GLUCOSE, UA: NEGATIVE
KETONES UA: NEGATIVE
Nitrite, UA: NEGATIVE
Protein, UA: NEGATIVE
RBC, UA: NEGATIVE
SPEC GRAV UA: 1.015 (ref 1.005–1.030)
Urobilinogen, Ur: 0.2 mg/dL (ref 0.2–1.0)
pH, UA: 7 (ref 5.0–7.5)

## 2017-06-07 LAB — MICROALBUMIN, URINE WAIVED
CREATININE, URINE WAIVED: 50 mg/dL (ref 10–300)
MICROALB, UR WAIVED: 10 mg/L (ref 0–19)

## 2017-06-07 LAB — BAYER DCA HB A1C WAIVED: HB A1C (BAYER DCA - WAIVED): 5.8 % (ref <7.0)

## 2017-06-07 LAB — MICROSCOPIC EXAMINATION

## 2017-06-07 MED ORDER — LITHIUM CARBONATE 300 MG PO CAPS: 600.0000 mg | ORAL_CAPSULE | Freq: Two times a day (BID) | ORAL | 1 refills | Status: DC

## 2017-06-07 MED ORDER — HYDROCHLOROTHIAZIDE 12.5 MG PO CAPS: 12.5000 mg | ORAL_CAPSULE | Freq: Every day | ORAL | 1 refills | Status: DC

## 2017-06-07 MED ORDER — BENAZEPRIL HCL 20 MG PO TABS: 20.0000 mg | ORAL_TABLET | Freq: Every day | ORAL | 1 refills | Status: DC

## 2017-06-07 MED ORDER — ALBUTEROL SULFATE HFA 108 (90 BASE) MCG/ACT IN AERS: 2.0000 | INHALATION_SPRAY | RESPIRATORY_TRACT | 3 refills | Status: DC | PRN

## 2017-06-07 MED ORDER — OMEPRAZOLE 20 MG PO CPDR: 20.0000 mg | DELAYED_RELEASE_CAPSULE | Freq: Every day | ORAL | 1 refills | Status: DC

## 2017-06-07 MED ORDER — FLUTICASONE PROPIONATE 50 MCG/ACT NA SUSP: 2.0000 | Freq: Every day | NASAL | 12 refills | Status: DC

## 2017-06-07 MED ORDER — FLUOXETINE HCL 20 MG PO CAPS: 20.0000 mg | ORAL_CAPSULE | Freq: Two times a day (BID) | ORAL | 1 refills | Status: DC

## 2017-06-07 NOTE — Assessment & Plan Note (Signed)
Stable on current regimen. Continue to monitor. Call with any concerns. Refills given today.  

## 2017-06-07 NOTE — Assessment & Plan Note (Addendum)
Slightly exacerbated on current regimen. Still has a lot of her klonopin left. Will call when she needs a refill. Will increase her prozac back to 40mg  daily- had been taking 20.

## 2017-06-07 NOTE — Assessment & Plan Note (Signed)
Stable on vitamin D. Rechecking labs today. Continue diet and exercise. Call with any concerns.

## 2017-06-07 NOTE — Assessment & Plan Note (Signed)
Stable. Continue benazepril. Call with any concerns. Rechecking labs.

## 2017-06-07 NOTE — Progress Notes (Signed)
BP 133/78 (BP Location: Left Arm, Patient Position: Sitting)   Pulse 68   Temp 98.6 F (37 C) (Oral)   Ht 5\' 2"  (1.575 m)   Wt 234 lb 3 oz (106.2 kg)   SpO2 100%   BMI 42.83 kg/m    Subjective:    Patient ID: Jaime Freeman, female    DOB: 1955-07-25, 62 y.o.   MRN: [id number]  HPI: Jaime Freeman is a 62 y.o. female presenting on 06/07/2017 for comprehensive medical examination. Current medical complaints include:  HYPERTENSION Hypertension status: controlled  Satisfied with current treatment? yes Duration of hypertension: chronic BP monitoring frequency:  not checking BP medication side effects:  no Medication compliance: excellent compliance Previous BP meds: benazepril- hctz Aspirin: yes Recurrent headaches: no Visual changes: no Palpitations: yes- with anxiety Dyspnea: yes- with anxiety Chest pain: yes with anxiety Lower extremity edema: yes Dizzy/lightheaded: no  Impaired Fasting Glucose HbA1C: 5.8 Lab Results  Component Value Date   HGBA1C 6.4 (H) 12/25/2014   Duration of elevated blood sugar: chronic Polydipsia: yes Polyuria: yes Weight change: yes Visual disturbance: no Glucose Monitoring: no Diabetic Education: Not Completed Family history of diabetes: yes  ANXIETY/STRESS- needs to get out of the house, caring for her husband Duration:exacerbated Anxious mood: yes  Excessive worrying: yes Irritability: no  Sweating: no Nausea: no Palpitations:no Hyperventilation: no Panic attacks: no Agoraphobia: no  Obscessions/compulsions: no Depressed mood: yes Depression screen [facility] 2/9 06/07/2017 10/13/2015 08/13/2015  Decreased Interest 0 0 0  Down, Depressed, Hopeless 1 0 0  PHQ - 2 Score 1 0 0  Altered sleeping 2 - -  Tired, decreased energy 2 - -  Change in appetite 3 - -  Feeling bad or failure about yourself  0 - -  Trouble concentrating 0 - -  Moving slowly or fidgety/restless 0 - -  Suicidal thoughts 0 - -  PHQ-9 Score 8 - -  Difficult  doing work/chores Somewhat difficult - -   Anhedonia: no Weight changes: no Insomnia: yes hard to fall asleep  Hypersomnia: no Fatigue/loss of energy: yes Feelings of worthlessness: yes Feelings of guilt: yes Impaired concentration/indecisiveness: no Suicidal ideations: no  Crying spells: no Recent Stressors/Life Changes: yes   Relationship problems: yes   Family stress: yes     Financial stress: no    Job stress: no    Recent death/loss: no  She currently lives with: husband Menopausal Symptoms: yes- hot flashes  Depression Screen done today and results listed below:  Depression screen [facility] 2/9 06/07/2017 10/13/2015 08/13/2015  Decreased Interest 0 0 0  Down, Depressed, Hopeless 1 0 0  PHQ - 2 Score 1 0 0  Altered sleeping 2 - -  Tired, decreased energy 2 - -  Change in appetite 3 - -  Feeling bad or failure about yourself  0 - -  Trouble concentrating 0 - -  Moving slowly or fidgety/restless 0 - -  Suicidal thoughts 0 - -  PHQ-9 Score 8 - -  Difficult doing work/chores Somewhat difficult - -    Past Medical History:  Past Medical History:  Diagnosis Date  . Allergic rhinitis   . Anxiety   . Bipolar affective disorder ([place])   . Depression   . Hiatal hernia   . Hypertension   . IFG (impaired fasting glucose)   . Menopausal state   . Morbid obesity ([place])   . OCD (obsessive compulsive disorder)   . Panic disorder   . Restrictive lung  disease   . Vitamin B12 deficiency   . Vitamin D deficiency disease     Surgical History:  Past Surgical History:  Procedure Laterality Date  . CESAREAN SECTION    . DILATION AND CURETTAGE OF UTERUS    . TONSILLECTOMY      Medications:  Current Outpatient Medications on File Prior to Visit  Medication Sig  . aspirin EC 81 MG tablet Take 81 mg by mouth daily.  . bisacodyl (DULCOLAX) 5 MG EC tablet Take 5 mg by mouth daily as needed for moderate constipation.  . clonazePAM (KLONOPIN) 1 MG tablet Take 1 tablet (1 mg total) by  mouth daily as needed for anxiety.  Marland Kitchen etodolac (LODINE) 400 MG tablet Take 1 tablet (400 mg total) by mouth 2 (two) times daily as needed.   No current facility-administered medications on file prior to visit.     Allergies:  Allergies  Allergen Reactions  . Codeine Other (See Comments)  . Dynacirc [Isradipine]   . Guaifenesin & Derivatives   . Sudafed [Pseudoephedrine]   . Zithromax [Azithromycin]   . Prednisone Anxiety    Anxiousness    Social History:  Social History   Socioeconomic History  . Marital status: Married    Spouse name: Not on file  . Number of children: Not on file  . Years of education: Not on file  . Highest education level: Not on file  Social Needs  . Financial resource strain: Not on file  . Food insecurity - worry: Not on file  . Food insecurity - inability: Not on file  . Transportation needs - medical: Not on file  . Transportation needs - non-medical: Not on file  Occupational History  . Not on file  Tobacco Use  . Smoking status: Former Smoker    Types: Cigarettes    Last attempt to quit: 04/27/1987    Years since quitting: 30.1  . Smokeless tobacco: Never Used  Substance and Sexual Activity  . Alcohol use: No  . Drug use: No  . Sexual activity: Not on file  Other Topics Concern  . Not on file  Social History Narrative  . Not on file   Social History   Tobacco Use  Smoking Status Former Smoker  . Types: Cigarettes  . Last attempt to quit: 04/27/1987  . Years since quitting: 30.1  Smokeless Tobacco Never Used   Social History   Substance and Sexual Activity  Alcohol Use No    Family History:  Family History  Problem Relation Age of Onset  . Heart disease Mother   . Heart attack Mother   . Hypertension Mother   . Cancer Father        GI tract  . Hypertension Father   . Cancer Maternal Grandmother        gallbladder  . Heart disease Maternal Grandfather   . COPD Neg Hx   . Diabetes Neg Hx   . Stroke Neg Hx     Past  medical history, surgical history, medications, allergies, family history and social history reviewed with patient today and changes made to appropriate areas of the chart.   Review of Systems  Constitutional: Negative.   HENT: Positive for congestion. Negative for ear discharge, ear pain, hearing loss, nosebleeds, sinus pain, sore throat and tinnitus.   Eyes: Negative.   Respiratory: Negative.  Negative for stridor.   Cardiovascular: Positive for chest pain, palpitations and leg swelling. Negative for orthopnea, claudication and PND.  Gastrointestinal: Positive for  constipation and heartburn. Negative for abdominal pain, blood in stool, diarrhea, melena, nausea and vomiting.       Feels like things are getting stuck in her throat  Genitourinary: Negative.   Musculoskeletal: Positive for joint pain and myalgias. Negative for back pain, falls and neck pain.  Skin: Negative.   Neurological: Positive for tingling. Negative for dizziness, tremors, sensory change, speech change, focal weakness, seizures, loss of consciousness and headaches.  Endo/Heme/Allergies: Negative for environmental allergies and polydipsia. Bruises/bleeds easily.  Psychiatric/Behavioral: Positive for depression. Negative for hallucinations, memory loss, substance abuse and suicidal ideas. The patient is nervous/anxious and has insomnia.     All other ROS negative except what is listed above and in the HPI.      Objective:    BP 133/78 (BP Location: Left Arm, Patient Position: Sitting)   Pulse 68   Temp 98.6 F (37 C) (Oral)   Ht 5\' 2"  (1.575 m)   Wt 234 lb 3 oz (106.2 kg)   SpO2 100%   BMI 42.83 kg/m   Wt Readings from Last 3 Encounters:  06/07/17 234 lb 3 oz (106.2 kg)  12/06/16 237 lb (107.5 kg)  11/22/16 239 lb (108.4 kg)    Physical Exam  Constitutional: She is oriented to person, place, and time. She appears well-developed and well-nourished. No distress.  HENT:  Head: Normocephalic and atraumatic.    Right Ear: Hearing, tympanic membrane, external ear and ear canal normal.  Left Ear: Hearing, tympanic membrane, external ear and ear canal normal.  Nose: Nose normal.  Mouth/Throat: Uvula is midline and oropharynx is clear and moist. No oropharyngeal exudate.  Eyes: Conjunctivae, EOM and lids are normal. Pupils are equal, round, and reactive to light. Right eye exhibits no discharge. Left eye exhibits no discharge. No scleral icterus.  Neck: Normal range of motion. Neck supple. No JVD present. No tracheal deviation present. No thyromegaly present.  Cardiovascular: Normal rate, regular rhythm, normal heart sounds and intact distal pulses. Exam reveals no gallop and no friction rub.  No murmur heard. Pulmonary/Chest: Effort normal and breath sounds normal. No stridor. No respiratory distress. She has no wheezes. She has no rales. She exhibits no tenderness. Right breast exhibits no inverted nipple, no mass, no nipple discharge, no skin change and no tenderness. Left breast exhibits no inverted nipple, no mass, no nipple discharge, no skin change and no tenderness. Breasts are symmetrical.  Abdominal: Soft. Bowel sounds are normal. She exhibits no distension and no mass. There is no tenderness. There is no rebound and no guarding. Hernia confirmed negative in the right inguinal area and confirmed negative in the left inguinal area.  Genitourinary: Vagina normal and uterus normal. No labial fusion. There is no rash, tenderness, lesion or injury on the right labia. There is no rash, tenderness, lesion or injury on the left labia. Uterus is not deviated, not enlarged, not fixed and not tender. Cervix exhibits no motion tenderness, no discharge and no friability. Right adnexum displays no mass, no tenderness and no fullness. Left adnexum displays no mass, no tenderness and no fullness. No erythema, tenderness or bleeding in the vagina. No foreign body in the vagina. No signs of injury around the vagina. No  vaginal discharge found.  Musculoskeletal: Normal range of motion. She exhibits no edema, tenderness or deformity.  Lymphadenopathy:    She has no cervical adenopathy.  Neurological: She is alert and oriented to person, place, and time. She has normal reflexes. She displays normal reflexes. No cranial  nerve deficit. She exhibits normal muscle tone. Coordination normal.  Skin: Skin is warm, dry and intact. No rash noted. She is not diaphoretic. No erythema. No pallor.  Psychiatric: She has a normal mood and affect. Her speech is normal and behavior is normal. Judgment and thought content normal. Cognition and memory are normal.  Nursing note and vitals reviewed.   Results for orders placed or performed in visit on 05/11/17  Microscopic Examination  Result Value Ref Range   WBC, UA >30 (A) 0 - 5 /hpf   RBC, UA 3-10 (A) 0 - 2 /hpf   Epithelial Cells (non renal) 0-10 0 - 10 /hpf   Bacteria, UA Moderate (A) None seen/Few  Urine Culture, Reflex  Result Value Ref Range   Urine Culture, Routine Final report (A)    Organism ID, Bacteria Klebsiella pneumoniae (A)    Antimicrobial Susceptibility Comment   UA/M w/rflx Culture, Routine  Result Value Ref Range   Specific Gravity, UA 1.015 1.005 - 1.030   pH, UA 7.0 5.0 - 7.5   Color, UA Orange Yellow   Appearance Ur Hazy (A) Clear   Leukocytes, UA 2+ (A) Negative   Protein, UA 1+ (A) Negative/Trace   Glucose, UA Trace (A) Negative   Ketones, UA Negative Negative   RBC, UA 1+ (A) Negative   Bilirubin, UA Negative Negative   Urobilinogen, Ur 1.0 0.2 - 1.0 mg/dL   Nitrite, UA Positive (A) Negative   Microscopic Examination See below:    Urinalysis Reflex Comment       Assessment & Plan:   Problem List Items Addressed This Visit      Digestive   Fatty liver    Rechecking labs today. Continue diet and exercise. Call with any concerns.       Relevant Orders   CBC with Differential/Platelet   Comprehensive metabolic panel   TSH   UA/M  w/rflx Culture, Routine     Endocrine   IFG (impaired fasting glucose)    Under good control with A1c of 5.8- continue diet and exercise. Call with any concerns.       Relevant Orders   Bayer DCA Hb A1c Waived   CBC with Differential/Platelet   Comprehensive metabolic panel   Microalbumin, Urine Waived   TSH   UA/M w/rflx Culture, Routine     Nervous and Auditory   Peripheral neuropathy    Stable on vitamin D. Rechecking labs today. Continue diet and exercise. Call with any concerns.       Relevant Medications   FLUoxetine (PROZAC) 20 MG capsule   lithium carbonate 300 MG capsule   Other Relevant Orders   CBC with Differential/Platelet   Comprehensive metabolic panel   TSH   UA/M w/rflx Culture, Routine     Musculoskeletal and Integument   Osteoarthritis of both knees    Continue etodolac. Stable. Call with any concerns. Continue to monitor.         Genitourinary   Benign hypertensive renal disease    Stable on current regimen. Continue to monitor. Call with any concerns. Refills given today.      Relevant Orders   CBC with Differential/Platelet   Comprehensive metabolic panel   Microalbumin, Urine Waived   TSH   UA/M w/rflx Culture, Routine   CKD (chronic kidney disease) stage 1, GFR 90 ml/min or greater    Stable. Continue benazepril. Call with any concerns. Rechecking labs.       Relevant Orders   CBC with Differential/Platelet  Comprehensive metabolic panel   TSH   UA/M w/rflx Culture, Routine     Other   Morbid obesity ([place])    Encouraged diet and exercise. Continue to monitor. Congratulated patient on continued weight loss.      Vitamin D deficiency disease    Stable on current regimen. Continue to monitor. Call with any concerns. Refills given today.      Relevant Orders   CBC with Differential/Platelet   Comprehensive metabolic panel   TSH   UA/M w/rflx Culture, Routine   VITAMIN D 25 Hydroxy (Vit-D Deficiency, Fractures)   Vitamin B12  deficiency    Stable on current regimen. Continue to monitor. Call with any concerns. Refills given today.      Relevant Orders   CBC with Differential/Platelet   Comprehensive metabolic panel   TSH   UA/M w/rflx Culture, Routine   B12 and Folate Panel   Depression    Slightly exacerbated on current regimen. Still has a lot of her klonopin left. Will call when she needs a refill. Will increase her prozac back to 40mg  daily- had been taking 20.      Relevant Medications   FLUoxetine (PROZAC) 20 MG capsule   Other Relevant Orders   CBC with Differential/Platelet   Comprehensive metabolic panel   TSH   UA/M w/rflx Culture, Routine   Anxiety    Slightly exacerbated on current regimen. Still has a lot of her klonopin left. Will call when she needs a refill. Will increase her prozac back to 40mg  daily- had been taking 20.      Relevant Medications   FLUoxetine (PROZAC) 20 MG capsule   Other Relevant Orders   CBC with Differential/Platelet   Comprehensive metabolic panel   TSH   UA/M w/rflx Culture, Routine   OCD (obsessive compulsive disorder)    Slightly exacerbated on current regimen. Still has a lot of her klonopin left. Will call when she needs a refill. Will increase her prozac back to 40mg  daily- had been taking 20.      Relevant Medications   FLUoxetine (PROZAC) 20 MG capsule   Other Relevant Orders   CBC with Differential/Platelet   Comprehensive metabolic panel   TSH   UA/M w/rflx Culture, Routine   Dyslipidemia    Stable on current regimen. Continue to monitor. Call with any concerns. Refills given today.      Relevant Orders   CBC with Differential/Platelet   Comprehensive metabolic panel   Lipid Panel w/o Chol/HDL Ratio   TSH   UA/M w/rflx Culture, Routine   Panic attacks    Slightly exacerbated on current regimen. Still has a lot of her klonopin left. Will call when she needs a refill. Will increase her prozac back to 40mg  daily- had been taking 20.        Relevant Medications   FLUoxetine (PROZAC) 20 MG capsule   Insomnia    Slightly exacerbated on current regimen. Still has a lot of her klonopin left. Will call when she needs a refill. Will increase her prozac back to 40mg  daily- had been taking 20.       Other Visit Diagnoses    Routine general medical examination at a health care facility    -  Primary   Vaccines declined. Pap done today. Screening labs checked today. Mammogram ordered. Colonoscopy refused. Call with any concerns. Continue diet and exercise.    Dysphagia, unspecified type       Known hiatal hernia, cannot have EGD- slightly  worse, will get back into GI.   Relevant Orders   Ambulatory referral to Gastroenterology   Screening for cervical cancer       Pap done today. Await results.    Relevant Orders   IGP, Aptima HPV, rfx 16/18,45       Follow up plan: Return in about 6 months (around 12/05/2017) for Follow up.   LABORATORY TESTING:  - Pap smear: pap done  IMMUNIZATIONS:   - Tdap: Tetanus vaccination status reviewed: Refused. - Influenza: Refused - Pneumovax: Refused - Prevnar: Not applicable - Zostavax vaccine: Refused  SCREENING: -Mammogram: Ordered today  - Colonoscopy: Refused  - Bone Density: Not applicable    PATIENT COUNSELING:   Advised to take 1 mg of folate supplement per day if capable of pregnancy.   Sexuality: Discussed sexually transmitted diseases, partner selection, use of condoms, avoidance of unintended pregnancy  and contraceptive alternatives.   Advised to avoid cigarette smoking.  I discussed with the patient that most people either abstain from alcohol or drink within safe limits (<=14/week and <=4 drinks/occasion for males, <=7/weeks and <= 3 drinks/occasion for females) and that the risk for alcohol disorders and other health effects rises proportionally with the number of drinks per week and how often a drinker exceeds daily limits.  Discussed cessation/primary prevention  of drug use and availability of treatment for abuse.   Diet: Encouraged to adjust caloric intake to maintain  or achieve ideal body weight, to reduce intake of dietary saturated fat and total fat, to limit sodium intake by avoiding high sodium foods and not adding table salt, and to maintain adequate dietary potassium and calcium preferably from fresh fruits, vegetables, and low-fat dairy products.    stressed the importance of regular exercise  Injury prevention: Discussed safety belts, safety helmets, smoke detector, smoking near bedding or upholstery.   Dental health: Discussed importance of regular tooth brushing, flossing, and dental visits.    NEXT PREVENTATIVE PHYSICAL DUE IN 1 YEAR. Return in about 6 months (around 12/05/2017) for Follow up.

## 2017-06-07 NOTE — Assessment & Plan Note (Signed)
Under good control with A1c of 5.8- continue diet and exercise. Call with any concerns.

## 2017-06-07 NOTE — Assessment & Plan Note (Signed)
Slightly exacerbated on current regimen. Still has a lot of her klonopin left. Will call when she needs a refill. Will increase her prozac back to 40mg  daily- had been taking 20.

## 2017-06-07 NOTE — Assessment & Plan Note (Signed)
Continue etodolac. Stable. Call with any concerns. Continue to monitor.

## 2017-06-07 NOTE — Assessment & Plan Note (Signed)
Rechecking labs today. Continue diet and exercise. Call with any concerns.

## 2017-06-07 NOTE — Addendum Note (Signed)
Addended by: Valerie Roys on: 06/07/2017 10:58 AM   Modules accepted: Orders

## 2017-06-07 NOTE — Assessment & Plan Note (Signed)
Encouraged diet and exercise. Continue to monitor. Congratulated patient on continued weight loss.

## 2017-06-08 ENCOUNTER — Encounter: Payer: Self-pay | Admitting: Family Medicine

## 2017-06-08 LAB — COMPREHENSIVE METABOLIC PANEL
A/G RATIO: 1.7 (ref 1.2–2.2)
ALT: 14 IU/L (ref 0–32)
AST: 15 IU/L (ref 0–40)
Albumin: 4.1 g/dL (ref 3.6–4.8)
Alkaline Phosphatase: 57 IU/L (ref 39–117)
BILIRUBIN TOTAL: 0.3 mg/dL (ref 0.0–1.2)
BUN/Creatinine Ratio: 17 (ref 12–28)
BUN: 13 mg/dL (ref 8–27)
CHLORIDE: 101 mmol/L (ref 96–106)
CO2: 24 mmol/L (ref 20–29)
Calcium: 10 mg/dL (ref 8.7–10.3)
Creatinine, Ser: 0.76 mg/dL (ref 0.57–1.00)
GFR calc Af Amer: 99 mL/min/{1.73_m2} (ref >59)
GFR calc non Af Amer: 86 mL/min/{1.73_m2} (ref >59)
GLOBULIN, TOTAL: 2.4 g/dL (ref 1.5–4.5)
Glucose: 103 mg/dL — ABNORMAL HIGH (ref 65–99)
POTASSIUM: 5 mmol/L (ref 3.5–5.2)
SODIUM: 140 mmol/L (ref 134–144)
Total Protein: 6.5 g/dL (ref 6.0–8.5)

## 2017-06-08 LAB — CBC WITH DIFFERENTIAL/PLATELET
BASOS: 0 %
Basophils Absolute: 0 10*3/uL (ref 0.0–0.2)
EOS (ABSOLUTE): 0.2 10*3/uL (ref 0.0–0.4)
EOS: 3 %
HEMATOCRIT: 38.2 % (ref 34.0–46.6)
Hemoglobin: 12.5 g/dL (ref 11.1–15.9)
Immature Grans (Abs): 0 10*3/uL (ref 0.0–0.1)
Immature Granulocytes: 0 %
LYMPHS ABS: 2.3 10*3/uL (ref 0.7–3.1)
Lymphs: 29 %
MCH: 32.1 pg (ref 26.6–33.0)
MCHC: 32.7 g/dL (ref 31.5–35.7)
MCV: 98 fL — AB (ref 79–97)
MONOS ABS: 0.5 10*3/uL (ref 0.1–0.9)
Monocytes: 6 %
NEUTROS ABS: 5 10*3/uL (ref 1.4–7.0)
NEUTROS PCT: 62 %
PLATELETS: 373 10*3/uL (ref 150–379)
RBC: 3.9 x10E6/uL (ref 3.77–5.28)
RDW: 12.6 % (ref 12.3–15.4)
WBC: 8.1 10*3/uL (ref 3.4–10.8)

## 2017-06-08 LAB — LIPID PANEL W/O CHOL/HDL RATIO
Cholesterol, Total: 224 mg/dL — ABNORMAL HIGH (ref 100–199)
HDL: 57 mg/dL (ref >39)
LDL Calculated: 133 mg/dL — ABNORMAL HIGH (ref 0–99)
TRIGLYCERIDES: 169 mg/dL — AB (ref 0–149)
VLDL Cholesterol Cal: 34 mg/dL (ref 5–40)

## 2017-06-08 LAB — B12 AND FOLATE PANEL
Folate: 6.3 ng/mL (ref >3.0)
Vitamin B-12: 246 pg/mL (ref 232–1245)

## 2017-06-08 LAB — TSH: TSH: 1.9 u[IU]/mL (ref 0.450–4.500)

## 2017-06-08 LAB — VITAMIN D 25 HYDROXY (VIT D DEFICIENCY, FRACTURES): Vit D, 25-Hydroxy: 24.7 ng/mL — ABNORMAL LOW (ref 30.0–100.0)

## 2017-06-08 LAB — LITHIUM LEVEL: LITHIUM LVL: 0.9 mmol/L (ref 0.6–1.2)

## 2017-06-10 ENCOUNTER — Telehealth: Payer: Self-pay | Admitting: Family Medicine

## 2017-06-10 LAB — IGP, APTIMA HPV, RFX 16/18,45
HPV APTIMA: NEGATIVE
PAP Smear Comment: 0

## 2017-06-10 NOTE — Telephone Encounter (Signed)
Copied from [place]. Topic: Quick Communication - See Telephone Encounter >> Jun 10, 2017 12:42 PM Bennye Alm wrote: CRM for notification. See Telephone encounter for: 06/10/17. Patient called to get lab results. Please advise.

## 2017-06-10 NOTE — Telephone Encounter (Signed)
Patient notified

## 2017-06-10 NOTE — Telephone Encounter (Signed)
Please let her know that her pap was normal. She's good for 5 years and that will be her last pap. Thanks!

## 2017-06-10 NOTE — Telephone Encounter (Signed)
Patient notified of lab results and that she will get a letter in the mail also.

## 2017-07-07 IMAGING — US US PELVIS COMPLETE
1 series · 14 of 25 positions shown · non-contrast
Comparison: CT Abdomen 07/01/2014

CLINICAL DATA: 59-year-old female with early satiety, bloating,
left lower quadrant pain. Initial encounter. Postmenopausal for 4
years. Initial encounter.

The patient declined transvaginal imaging.
EXAM:
TRANSABDOMINAL ULTRASOUND OF PELVIS
TECHNIQUE: Transabdominal ultrasound examination of the pelvis was performed
including evaluation of the uterus, ovaries, adnexal regions, and
pelvic cul-de-sac.

[Series 2: us pelvis complete · 0.22mm/px · 14 of 43 slices shown]
[im 1/43]
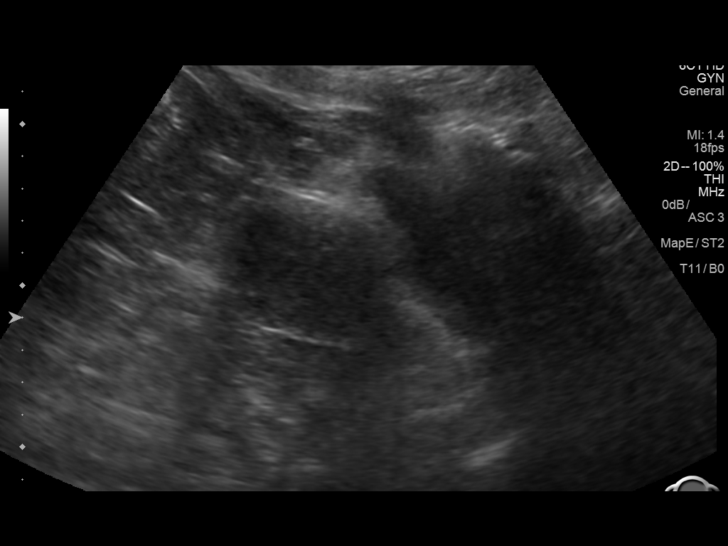
[im 4/43]
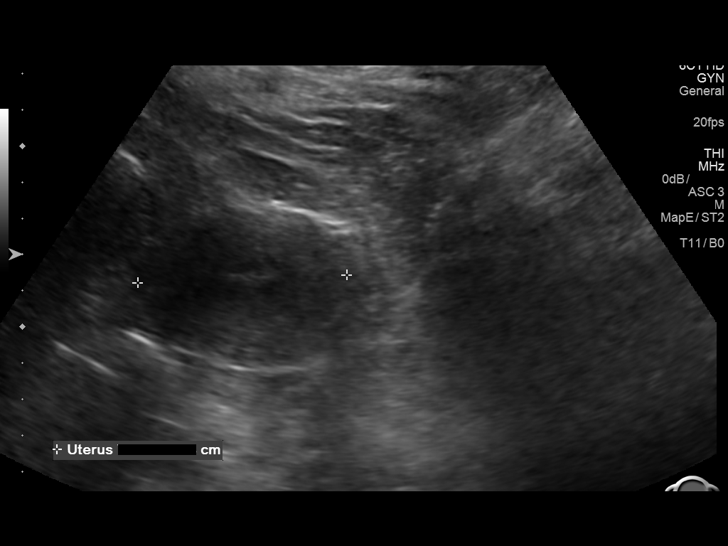
[im 8/43]
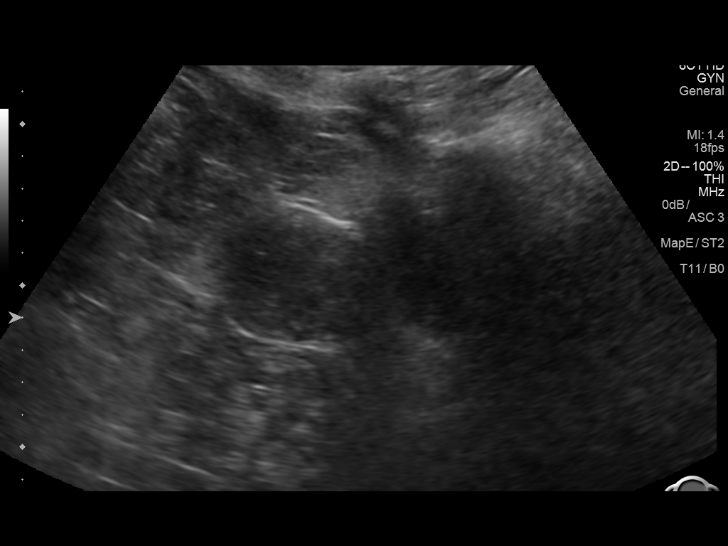
[im 11/43]
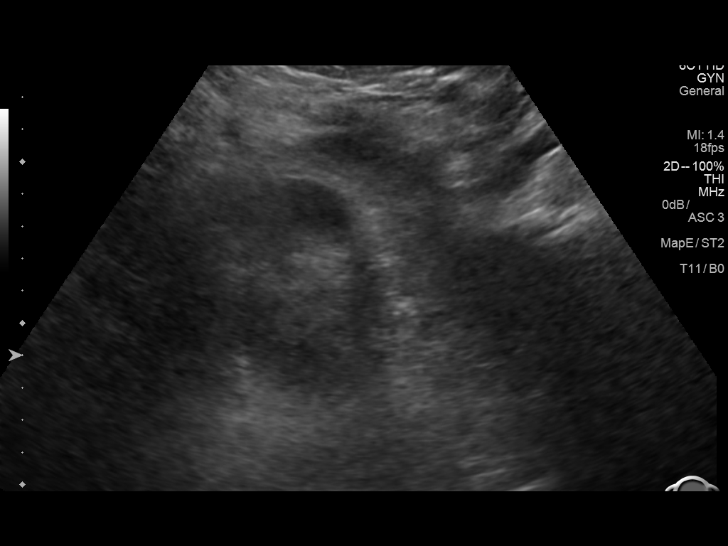
[im 15/43]
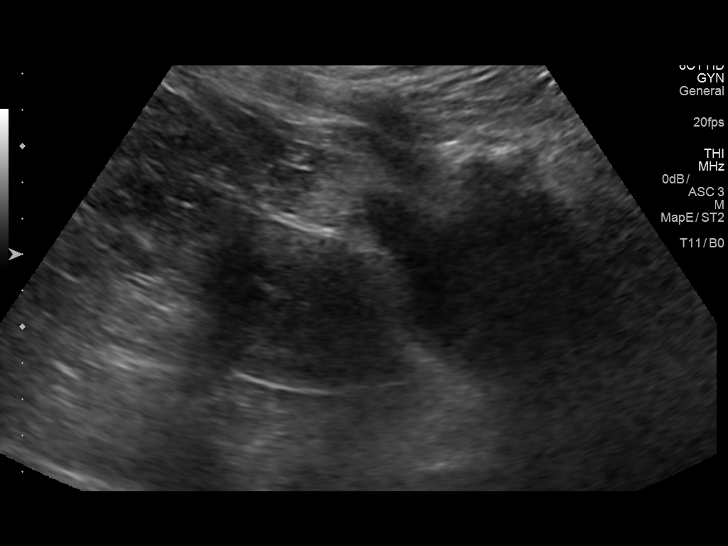
[im 16/43]
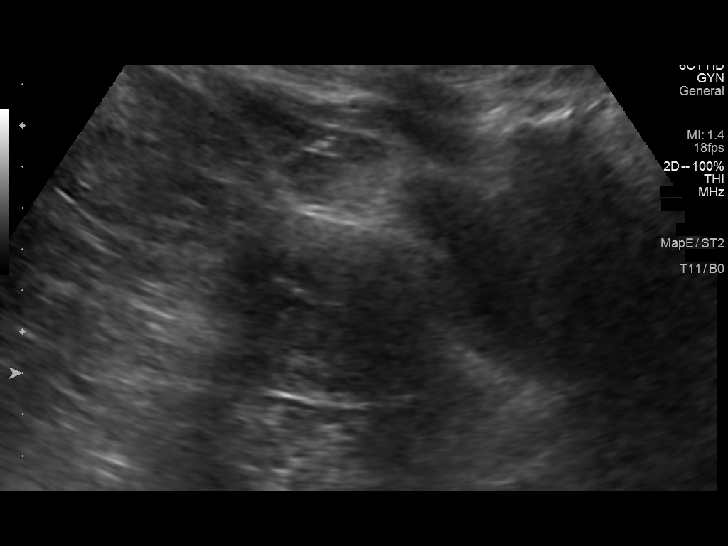
[im 20/43]
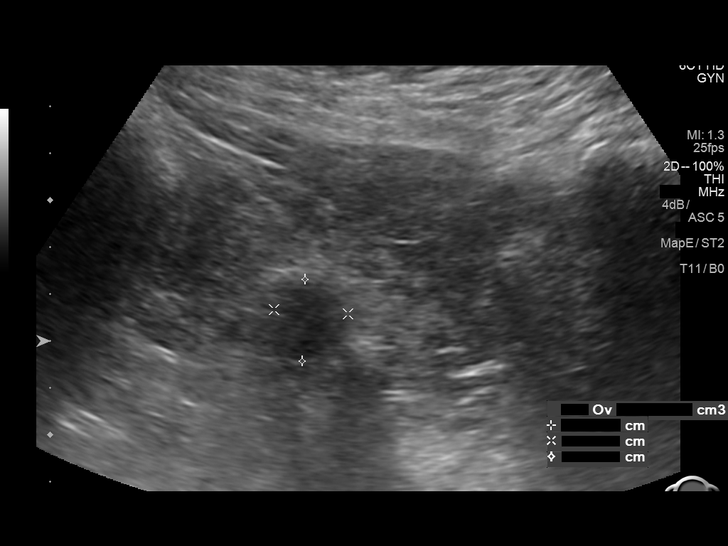
[im 23/43]
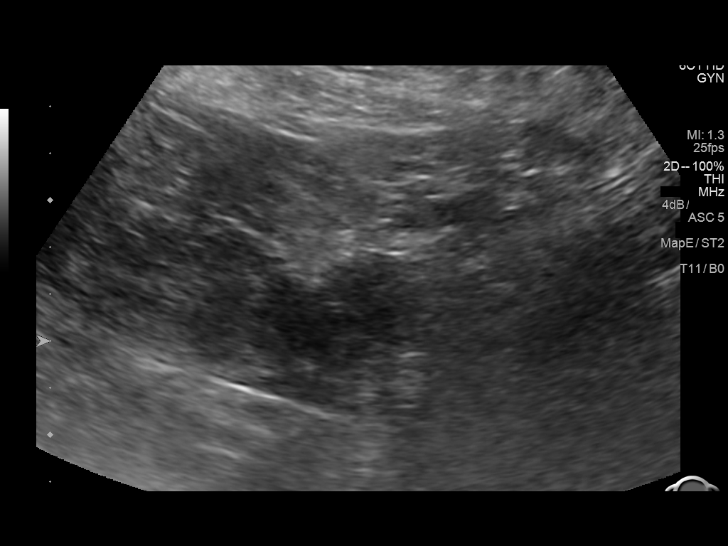
[im 27/43]
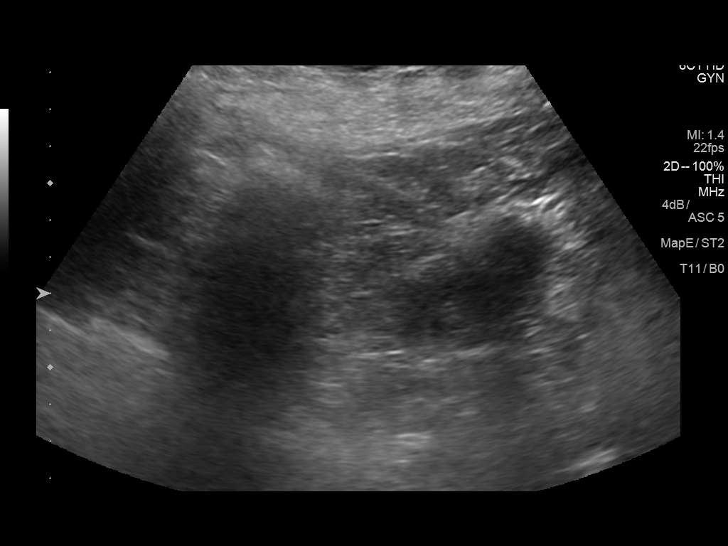
[im 29/43]
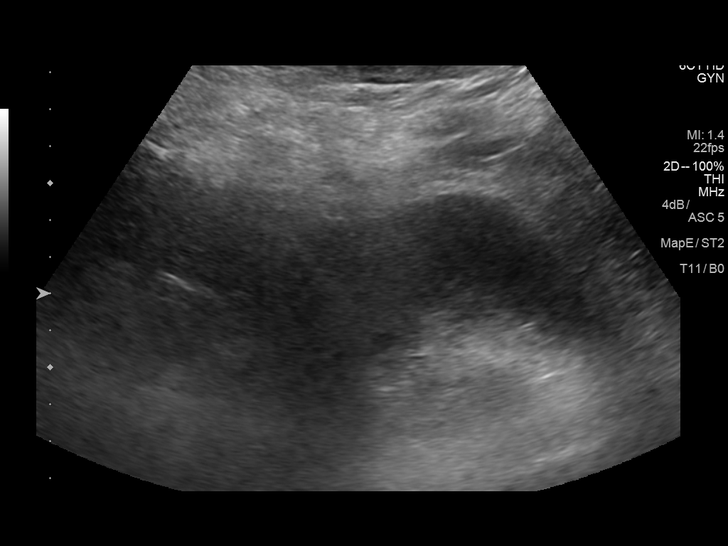
[im 32/43]
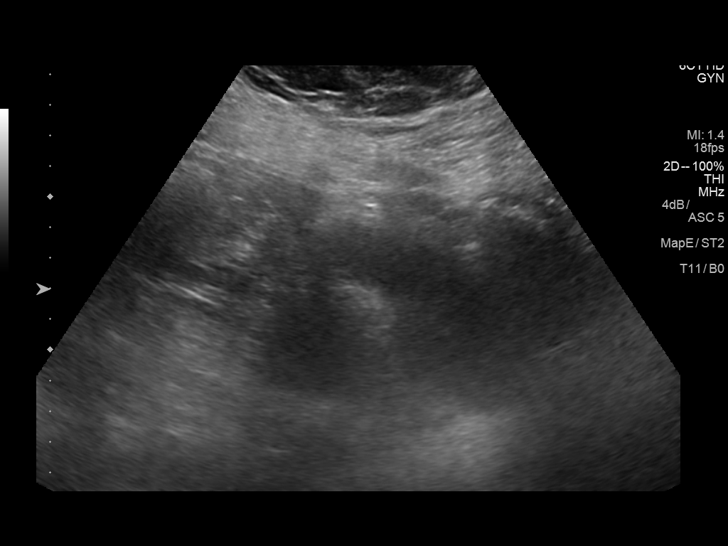
[im 36/43]
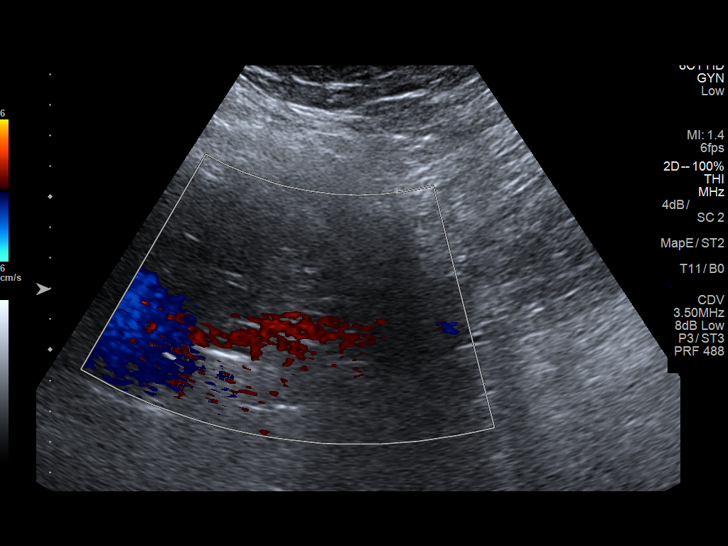
[im 39/43]
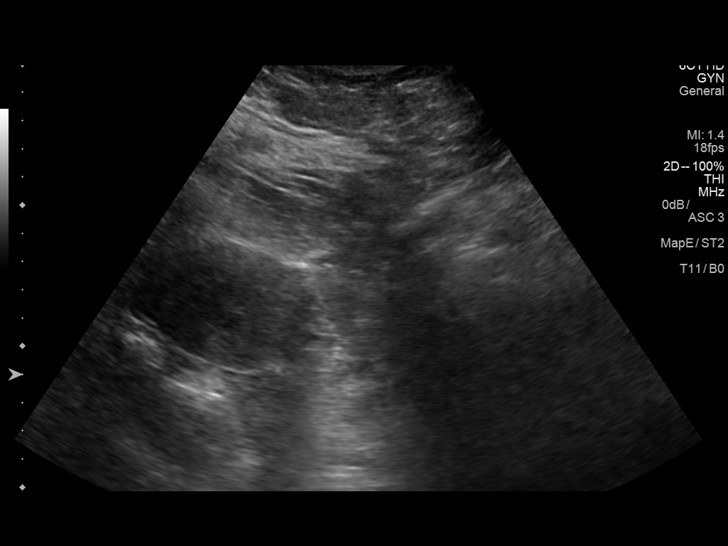
[im 43/43]
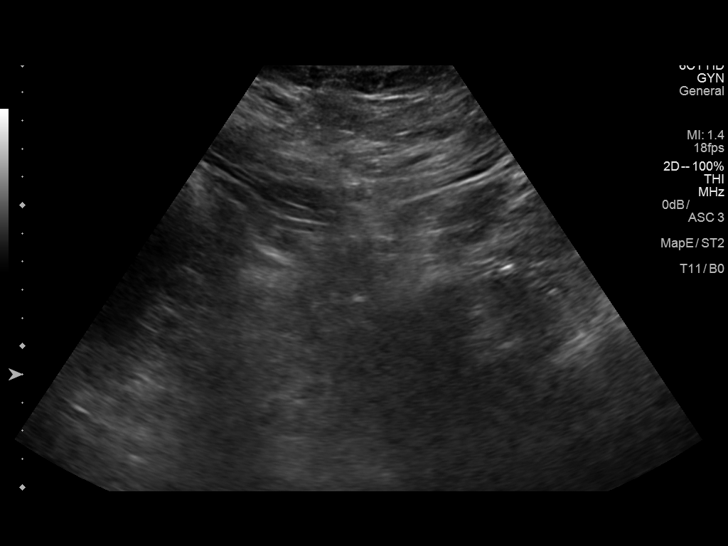

[14 of 25 positions shown; findings below may reference images not displayed]

FINDINGS: Uterus

Measurements: 9.1 x 4.1 x 5.6 cm. No fibroids or other mass
visualized.

Endometrium

Thickness: 3 mm.  No focal abnormality visualized.

Right ovary

Measurements: Could not be visualized. The patient declined
transvaginal imaging.

Left ovary

Measurements: 1.9 x 1.6 x 1.7 cm. Normal appearance/no adnexal mass.

Other findings:  No abnormal free fluid.
IMPRESSION: 1. Normal sonographic appearance of the uterus and left ovary.
2. The right ovary could not be visualized. The patient declined
transvaginal imaging.

## 2017-07-20 ENCOUNTER — Telehealth: Payer: Self-pay | Admitting: Family Medicine

## 2017-07-20 NOTE — Telephone Encounter (Signed)
Will leave for Dr. Wynetta Emery to review upon her return tomorrow

## 2017-07-20 NOTE — Telephone Encounter (Signed)
Patients husband passed away 2 weeks ago and she does not have any financial support so she does not feel she can schedule an appointment to come in to see Dr Wynetta Emery but she would like to possibly talk with Dr Wynetta Emery regarding her situation since Dr Wynetta Emery is aware of the patients history.  She is needing a refill on her Clonazepam.  [facility] [phone]

## 2017-07-21 MED ORDER — CLONAZEPAM 1 MG PO TABS: 0.5000 mg | ORAL_TABLET | Freq: Two times a day (BID) | ORAL | 0 refills | Status: DC | PRN

## 2017-07-21 MED ORDER — CLONAZEPAM 1 MG PO TABS: 1.0000 mg | ORAL_TABLET | Freq: Every day | ORAL | 0 refills | Status: DC | PRN

## 2017-07-21 NOTE — Telephone Encounter (Signed)
Called patient back today. Lost her husband very suddenly about 2 weeks ago after 41 years together.  She is hanging in there, but is feeling really terrible. Having panic feelings and crying attacks. She has been taking her medicine more now because of her grief. Discussed possibly going up on her fluoxetine. Patient very upset on the phone. She is working on doing a little at a time, but is working on things. Taking things slowly. Will get her refill of her klonopin to her pharmacy.   Patient was concerned because she has a balance here and does not have a source of income. She is very concerned about how she is going to be able to pay for this. She is very concerned about this- please call call her to see what she can do.

## 2017-07-28 ENCOUNTER — Ambulatory Visit: Payer: Self-pay | Admitting: Family Medicine

## 2017-07-28 NOTE — Telephone Encounter (Signed)
Spoke with patient and advised that we would work out a payment plan with her during this time.

## 2017-07-29 ENCOUNTER — Ambulatory Visit (INDEPENDENT_AMBULATORY_CARE_PROVIDER_SITE_OTHER): Payer: Self-pay | Admitting: Family Medicine

## 2017-07-29 ENCOUNTER — Encounter: Payer: Self-pay | Admitting: Family Medicine

## 2017-07-29 VITALS — BP 149/82 | HR 75 | Temp 98.6°F | Wt 231.1 lb

## 2017-07-29 DIAGNOSIS — H6123 Impacted cerumen, bilateral: Secondary | ICD-10-CM

## 2017-07-29 DIAGNOSIS — F4321 Adjustment disorder with depressed mood: Secondary | ICD-10-CM

## 2017-07-29 NOTE — Progress Notes (Signed)
BP (!) 149/82 (BP Location: Left Arm, Patient Position: Sitting, Cuff Size: Large)   Pulse 75   Temp 98.6 F (37 C)   Wt 231 lb 1 oz (104.8 kg)   SpO2 97%   BMI 42.26 kg/m    Subjective:    Patient ID: Jaime Freeman, female    DOB: October 15, 1955, 62 y.o.   MRN: [id number]  HPI: Jaime Freeman is a 62 y.o. female  Chief Complaint  Patient presents with  . ears clogged   EAG CLOGGED Duration: 2 weeks Involved ear(s):  bilateral Sensation of feeling clogged/plugged: yes Decreased/muffled hearing:yes Ear pain: no Fever: no Otorrhea: no Hearing loss: no Upper respiratory infection symptoms: no Using Q-Tips: no Status: worse History of cerumenosis: yes Treatments attempted: none  Jaime Freeman is doing better. She is taking 1 day at a time. She is still very sad, but she is hanging in there.   Relevant past medical, surgical, family and social history reviewed and updated as indicated. Interim medical history since our last visit reviewed. Allergies and medications reviewed and updated.  Review of Systems  Constitutional: Negative.   HENT: Positive for congestion and ear discharge. Negative for dental problem, drooling, ear pain, facial swelling, hearing loss, mouth sores, nosebleeds, postnasal drip, rhinorrhea, sinus pressure, sinus pain, sneezing, sore throat, tinnitus, trouble swallowing and voice change.   Eyes: Negative.   Respiratory: Negative.   Cardiovascular: Negative.   Psychiatric/Behavioral: Negative.     Per HPI unless specifically indicated above     Objective:    BP (!) 149/82 (BP Location: Left Arm, Patient Position: Sitting, Cuff Size: Large)   Pulse 75   Temp 98.6 F (37 C)   Wt 231 lb 1 oz (104.8 kg)   SpO2 97%   BMI 42.26 kg/m   Wt Readings from Last 3 Encounters:  07/29/17 231 lb 1 oz (104.8 kg)  06/07/17 234 lb 3 oz (106.2 kg)  12/06/16 237 lb (107.5 kg)    Physical Exam  Constitutional: She is oriented to person, place, and time. She  appears well-developed and well-nourished. No distress.  HENT:  Head: Normocephalic and atraumatic.  Right Ear: Hearing and external ear normal.  Left Ear: Hearing and external ear normal.  Nose: Nose normal.  Mouth/Throat: Uvula is midline, oropharynx is clear and moist and mucous membranes are normal. No oropharyngeal exudate.  Cerumen impaction bilaterally  Eyes: Pupils are equal, round, and reactive to light. Conjunctivae, EOM and lids are normal. Right eye exhibits no discharge. Left eye exhibits no discharge. No scleral icterus.  Cardiovascular: Normal rate, regular rhythm, normal heart sounds and intact distal pulses. Exam reveals no gallop and no friction rub.  No murmur heard. Pulmonary/Chest: Effort normal and breath sounds normal. No respiratory distress. She has no wheezes. She has no rales. She exhibits no tenderness.  Musculoskeletal: Normal range of motion.  Neurological: She is alert and oriented to person, place, and time.  Skin: Skin is warm, dry and intact. No rash noted. She is not diaphoretic. No erythema. No pallor.  Psychiatric: She has a normal mood and affect. Her speech is normal and behavior is normal. Judgment and thought content normal. Cognition and memory are normal.    Results for orders placed or performed in visit on 06/07/17  Microscopic Examination  Result Value Ref Range   WBC, UA 0-5 0 - 5 /hpf   RBC, UA 0-2 0 - 2 /hpf   Epithelial Cells (non renal) 0-10 0 - 10 /hpf  Renal Epithel, UA 0-10 (A) None seen /hpf   Bacteria, UA Few None seen/Few  Bayer DCA Hb A1c Waived  Result Value Ref Range   Bayer DCA Hb A1c Waived 5.8 <7.0 %  CBC with Differential/Platelet  Result Value Ref Range   WBC 8.1 3.4 - 10.8 x10E3/uL   RBC 3.90 3.77 - 5.28 x10E6/uL   Hemoglobin 12.5 11.1 - 15.9 g/dL   Hematocrit 38.2 34.0 - 46.6 %   MCV 98 (H) 79 - 97 fL   MCH 32.1 26.6 - 33.0 pg   MCHC 32.7 31.5 - 35.7 g/dL   RDW 12.6 12.3 - 15.4 %   Platelets 373 150 - 379  x10E3/uL   Neutrophils 62 Not Estab. %   Lymphs 29 Not Estab. %   Monocytes 6 Not Estab. %   Eos 3 Not Estab. %   Basos 0 Not Estab. %   Neutrophils Absolute 5.0 1.4 - 7.0 x10E3/uL   Lymphocytes Absolute 2.3 0.7 - 3.1 x10E3/uL   Monocytes Absolute 0.5 0.1 - 0.9 x10E3/uL   EOS (ABSOLUTE) 0.2 0.0 - 0.4 x10E3/uL   Basophils Absolute 0.0 0.0 - 0.2 x10E3/uL   Immature Granulocytes 0 Not Estab. %   Immature Grans (Abs) 0.0 0.0 - 0.1 x10E3/uL  Comprehensive metabolic panel  Result Value Ref Range   Glucose 103 (H) 65 - 99 mg/dL   BUN 13 8 - 27 mg/dL   Creatinine, Ser 0.76 0.57 - 1.00 mg/dL   GFR calc non Af Amer 86 >59 mL/min/1.73   GFR calc Af Amer 99 >59 mL/min/1.73   BUN/Creatinine Ratio 17 12 - 28   Sodium 140 134 - 144 mmol/L   Potassium 5.0 3.5 - 5.2 mmol/L   Chloride 101 96 - 106 mmol/L   CO2 24 20 - 29 mmol/L   Calcium 10.0 8.7 - 10.3 mg/dL   Total Protein 6.5 6.0 - 8.5 g/dL   Albumin 4.1 3.6 - 4.8 g/dL   Globulin, Total 2.4 1.5 - 4.5 g/dL   Albumin/Globulin Ratio 1.7 1.2 - 2.2   Bilirubin Total 0.3 0.0 - 1.2 mg/dL   Alkaline Phosphatase 57 39 - 117 IU/L   AST 15 0 - 40 IU/L   ALT 14 0 - 32 IU/L  Lipid Panel w/o Chol/HDL Ratio  Result Value Ref Range   Cholesterol, Total 224 (H) 100 - 199 mg/dL   Triglycerides 169 (H) 0 - 149 mg/dL   HDL 57 >39 mg/dL   VLDL Cholesterol Cal 34 5 - 40 mg/dL   LDL Calculated 133 (H) 0 - 99 mg/dL  Microalbumin, Urine Waived  Result Value Ref Range   Microalb, Ur Waived 10 0 - 19 mg/L   Creatinine, Urine Waived 50 10 - 300 mg/dL   Microalb/Creat Ratio 30-300 (H) <30 mg/g  TSH  Result Value Ref Range   TSH 1.900 0.450 - 4.500 uIU/mL  UA/M w/rflx Culture, Routine  Result Value Ref Range   Specific Gravity, UA 1.015 1.005 - 1.030   pH, UA 7.0 5.0 - 7.5   Color, UA Yellow Yellow   Appearance Ur Hazy (A) Clear   Leukocytes, UA Trace (A) Negative   Protein, UA Negative Negative/Trace   Glucose, UA Negative Negative   Ketones, UA  Negative Negative   RBC, UA Negative Negative   Bilirubin, UA Negative Negative   Urobilinogen, Ur 0.2 0.2 - 1.0 mg/dL   Nitrite, UA Negative Negative   Microscopic Examination See below:   VITAMIN D 25 Hydroxy (  Vit-D Deficiency, Fractures)  Result Value Ref Range   Vit D, 25-Hydroxy 24.7 (L) 30.0 - 100.0 ng/mL  B12 and Folate Panel  Result Value Ref Range   Vitamin B-12 246 232 - 1,245 pg/mL   Folate 6.3 >3.0 ng/mL  Lithium level  Result Value Ref Range   Lithium Lvl 0.9 0.6 - 1.2 mmol/L  IGP, Aptima HPV, rfx 16/18,45  Result Value Ref Range   DIAGNOSIS: Comment    Specimen adequacy: Comment    Clinician Provided ICD10 Comment    Performed by: Comment    PAP Smear Comment .    Note: Comment    Test Methodology CANCELED    HPV Aptima Negative Negative      Assessment & Plan:   Problem List Items Addressed This Visit    None    Visit Diagnoses    Bilateral impacted cerumen    -  Primary   Ears flushed today with good results. Call with any concerns.    Grief       Taking it one day at a time. Supported patient. She will let us know if there is anything she needs.        Follow up plan: Return if symptoms worsen or fail to improve.

## 2017-08-08 ENCOUNTER — Telehealth: Payer: Self-pay | Admitting: Family Medicine

## 2017-08-08 MED ORDER — ETODOLAC 400 MG PO TABS: 400.0000 mg | ORAL_TABLET | Freq: Two times a day (BID) | ORAL | 3 refills | Status: DC | PRN

## 2017-08-08 NOTE — Telephone Encounter (Signed)
Pt would like a refill for etodolac sent to [facility].

## 2017-08-17 ENCOUNTER — Ambulatory Visit: Payer: Self-pay | Admitting: *Deleted

## 2017-08-17 NOTE — Telephone Encounter (Signed)
Patient is starting a sinus infection- she reports she gets them yearly and she has been using her sleep aid. Patient lost her husband 1 month ago- she is requesting an antibiotic- Augmentin. Patient states she uses [employer].  Patient is using cough drops- she is limited due to her allergies to medications. Told patient she may be required to come into the office for evaluation.  Reason for Disposition . [1] Sinus congestion as part of a cold AND [2] present < 10 days  Answer Assessment - Initial Assessment Questions 1. LOCATION: "Which ear is involved?"     No ear pain 2. ONSET: "When did the ear start hurting"      n/a 3. SEVERITY: "How bad is the pain?"  (Scale 1-10; mild, moderate or severe)   - MILD (1-3): doesn't interfere with normal activities    - MODERATE (4-7): interferes with normal activities or awakens from sleep    - SEVERE (8-10): excruciating pain, unable to do any normal activities      n/a 4. URI SYMPTOMS: " Do you have a runny nose or cough?"     Congestion in nose sore throat 5. FEVER: "Do you have a fever?" If so, ask: "What is your temperature, how was it measured, and when did it start?"     No fever 6. CAUSE: "Have you been swimming recently?", "How often do you use Q-TIPS?", "Have you had any recent air travel or scuba diving?"     n/a 7. OTHER SYMPTOMS: "Do you have any other symptoms?" (e.g., headache, stiff neck, dizziness, vomiting, runny nose, decreased hearing)     No- eye pain 8. PREGNANCY: "Is there any chance you are pregnant?" "When was your last menstrual period?"     n/a  Answer Assessment - Initial Assessment Questions 1. LOCATION: "Where does it hurt?"      Sinus pain at cheeks and throat on fire 2. ONSET: "When did the sinus pain start?"  (e.g., hours, days)      yesterday 3. SEVERITY: "How bad is the pain?"   (Scale 1-10; mild, moderate or severe)   - MILD (1-3): doesn't interfere with normal activities    - MODERATE (4-7):  interferes with normal activities (e.g., work or school) or awakens from sleep   - SEVERE (8-10): excruciating pain and patient unable to do any normal activities        moderate 4. RECURRENT SYMPTOM: "Have you ever had sinus problems before?" If so, ask: "When was the last time?" and "What happened that time?"      Patient reports yearly sinus infections  5. NASAL CONGESTION: "Is the nose blocked?" If so, ask, "Can you open it or must you breathe through the mouth?"     Beginning to start- not mouth breathing at this time 6. NASAL DISCHARGE: "Do you have discharge from your nose?" If so ask, "What color?"     Not looked 7. FEVER: "Do you have a fever?" If so, ask: "What is it, how was it measured, and when did it start?"      No fever 8. OTHER SYMPTOMS: "Do you have any other symptoms?" (e.g., sore throat, cough, earache, difficulty breathing)     Sore throat, slight cough starting, when laying down she feels difference 9. PREGNANCY: "Is there any chance you are pregnant?" "When was your last menstrual period?"     n/a  Protocols used: SINUS PAIN OR CONGESTION-A-AH, EARACHE-A-AH

## 2017-08-18 NOTE — Telephone Encounter (Signed)
I can see her at 1 if she can come in

## 2017-08-19 ENCOUNTER — Ambulatory Visit (INDEPENDENT_AMBULATORY_CARE_PROVIDER_SITE_OTHER): Payer: Self-pay | Admitting: Family Medicine

## 2017-08-19 ENCOUNTER — Encounter: Payer: Self-pay | Admitting: Family Medicine

## 2017-08-19 VITALS — BP 112/72 | HR 95 | Temp 99.4°F | Wt 222.6 lb

## 2017-08-19 DIAGNOSIS — F4321 Adjustment disorder with depressed mood: Secondary | ICD-10-CM

## 2017-08-19 DIAGNOSIS — J0100 Acute maxillary sinusitis, unspecified: Secondary | ICD-10-CM

## 2017-08-19 MED ORDER — AMOXICILLIN-POT CLAVULANATE 875-125 MG PO TABS: 1.0000 | ORAL_TABLET | Freq: Two times a day (BID) | ORAL | 0 refills | Status: DC

## 2017-08-19 NOTE — Progress Notes (Signed)
BP 112/72 (BP Location: Right Arm, Patient Position: Sitting, Cuff Size: Large)   Pulse 95   Temp 99.4 F (37.4 C) (Oral)   Wt 222 lb 9.6 oz (101 kg)   SpO2 95%   BMI 40.71 kg/m    Subjective:    Patient ID: Jaime Freeman, female    DOB: Jan 23, 1956, 62 y.o.   MRN: [id number]  HPI: Jaime Freeman is a 62 y.o. female  Chief Complaint  Patient presents with  . Sinusitis    Ongoing for 2 days.  . Sinus Pressure  . Teeth Pain  . Headache  . Nasal Congestion   Eye and facial pressure, HA, tooth pain, congestion, sinus pressure x 3 days. Having fevers, body aches. Trying flonase and saline with no relief. Allergic to many OTC and rx medications, very limited in what she can take. States she gets these sinus infections every year around this time. Hx of allergic rhinitis and restrictive lung dz. Former smoker.   Husband recently passed away, pt is struggling very much with adjustment and grief. Has not seen a counselor to this point but has been started on medications to help. Feels she needs more help, crying all throughout the day, can't stay at home at night. Denies SI/HI.   Relevant past medical, surgical, family and social history reviewed and updated as indicated. Interim medical history since our last visit reviewed. Allergies and medications reviewed and updated.  Review of Systems  Per HPI unless specifically indicated above     Objective:    BP 112/72 (BP Location: Right Arm, Patient Position: Sitting, Cuff Size: Large)   Pulse 95   Temp 99.4 F (37.4 C) (Oral)   Wt 222 lb 9.6 oz (101 kg)   SpO2 95%   BMI 40.71 kg/m   Wt Readings from Last 3 Encounters:  08/19/17 222 lb 9.6 oz (101 kg)  07/29/17 231 lb 1 oz (104.8 kg)  06/07/17 234 lb 3 oz (106.2 kg)    Physical Exam  Constitutional: She is oriented to person, place, and time. She appears well-developed and well-nourished. No distress.  HENT:  Head: Atraumatic.  Right Ear: External ear normal.  Left Ear:  External ear normal.  Oropharynx and nasal mucosa erythematous with drainage present B/l facial ttp  Eyes: Pupils are equal, round, and reactive to light. Conjunctivae are normal.  Neck: Normal range of motion. Neck supple.  Cardiovascular: Normal rate and normal heart sounds.  Pulmonary/Chest: Effort normal and breath sounds normal. She has no wheezes.  Musculoskeletal: Normal range of motion.  Lymphadenopathy:    She has no cervical adenopathy.  Neurological: She is alert and oriented to person, place, and time.  Skin: Skin is warm and dry.  Psychiatric:  Becomes very tearful discussing her husband's recent passing  Nursing note and vitals reviewed.   Results for orders placed or performed in visit on 06/07/17  Microscopic Examination  Result Value Ref Range   WBC, UA 0-5 0 - 5 /hpf   RBC, UA 0-2 0 - 2 /hpf   Epithelial Cells (non renal) 0-10 0 - 10 /hpf   Renal Epithel, UA 0-10 (A) None seen /hpf   Bacteria, UA Few None seen/Few  Bayer DCA Hb A1c Waived  Result Value Ref Range   Bayer DCA Hb A1c Waived 5.8 <7.0 %  CBC with Differential/Platelet  Result Value Ref Range   WBC 8.1 3.4 - 10.8 x10E3/uL   RBC 3.90 3.77 - 5.28 x10E6/uL  Hemoglobin 12.5 11.1 - 15.9 g/dL   Hematocrit 38.2 34.0 - 46.6 %   MCV 98 (H) 79 - 97 fL   MCH 32.1 26.6 - 33.0 pg   MCHC 32.7 31.5 - 35.7 g/dL   RDW 12.6 12.3 - 15.4 %   Platelets 373 150 - 379 x10E3/uL   Neutrophils 62 Not Estab. %   Lymphs 29 Not Estab. %   Monocytes 6 Not Estab. %   Eos 3 Not Estab. %   Basos 0 Not Estab. %   Neutrophils Absolute 5.0 1.4 - 7.0 x10E3/uL   Lymphocytes Absolute 2.3 0.7 - 3.1 x10E3/uL   Monocytes Absolute 0.5 0.1 - 0.9 x10E3/uL   EOS (ABSOLUTE) 0.2 0.0 - 0.4 x10E3/uL   Basophils Absolute 0.0 0.0 - 0.2 x10E3/uL   Immature Granulocytes 0 Not Estab. %   Immature Grans (Abs) 0.0 0.0 - 0.1 x10E3/uL  Comprehensive metabolic panel  Result Value Ref Range   Glucose 103 (H) 65 - 99 mg/dL   BUN 13 8 - 27 mg/dL    Creatinine, Ser 0.76 0.57 - 1.00 mg/dL   GFR calc non Af Amer 86 >59 mL/min/1.73   GFR calc Af Amer 99 >59 mL/min/1.73   BUN/Creatinine Ratio 17 12 - 28   Sodium 140 134 - 144 mmol/L   Potassium 5.0 3.5 - 5.2 mmol/L   Chloride 101 96 - 106 mmol/L   CO2 24 20 - 29 mmol/L   Calcium 10.0 8.7 - 10.3 mg/dL   Total Protein 6.5 6.0 - 8.5 g/dL   Albumin 4.1 3.6 - 4.8 g/dL   Globulin, Total 2.4 1.5 - 4.5 g/dL   Albumin/Globulin Ratio 1.7 1.2 - 2.2   Bilirubin Total 0.3 0.0 - 1.2 mg/dL   Alkaline Phosphatase 57 39 - 117 IU/L   AST 15 0 - 40 IU/L   ALT 14 0 - 32 IU/L  Lipid Panel w/o Chol/HDL Ratio  Result Value Ref Range   Cholesterol, Total 224 (H) 100 - 199 mg/dL   Triglycerides 169 (H) 0 - 149 mg/dL   HDL 57 >39 mg/dL   VLDL Cholesterol Cal 34 5 - 40 mg/dL   LDL Calculated 133 (H) 0 - 99 mg/dL  Microalbumin, Urine Waived  Result Value Ref Range   Microalb, Ur Waived 10 0 - 19 mg/L   Creatinine, Urine Waived 50 10 - 300 mg/dL   Microalb/Creat Ratio 30-300 (H) <30 mg/g  TSH  Result Value Ref Range   TSH 1.900 0.450 - 4.500 uIU/mL  UA/M w/rflx Culture, Routine  Result Value Ref Range   Specific Gravity, UA 1.015 1.005 - 1.030   pH, UA 7.0 5.0 - 7.5   Color, UA Yellow Yellow   Appearance Ur Hazy (A) Clear   Leukocytes, UA Trace (A) Negative   Protein, UA Negative Negative/Trace   Glucose, UA Negative Negative   Ketones, UA Negative Negative   RBC, UA Negative Negative   Bilirubin, UA Negative Negative   Urobilinogen, Ur 0.2 0.2 - 1.0 mg/dL   Nitrite, UA Negative Negative   Microscopic Examination See below:   VITAMIN D 25 Hydroxy (Vit-D Deficiency, Fractures)  Result Value Ref Range   Vit D, 25-Hydroxy 24.7 (L) 30.0 - 100.0 ng/mL  B12 and Folate Panel  Result Value Ref Range   Vitamin B-12 246 232 - 1,245 pg/mL   Folate 6.3 >3.0 ng/mL  Lithium level  Result Value Ref Range   Lithium Lvl 0.9 0.6 - 1.2 mmol/L  IGP, Aptima  HPV, rfx 16/18,45  Result Value Ref Range    DIAGNOSIS: Comment    Specimen adequacy: Comment    Clinician Provided ICD10 Comment    Performed by: Comment    PAP Smear Comment .    Note: Comment    Test Methodology CANCELED    HPV Aptima Negative Negative      Assessment & Plan:   Problem List Items Addressed This Visit    None    Visit Diagnoses    Acute maxillary sinusitis, recurrence not specified    -  Primary   Appears viral at this point, continue supportive care and if worsening or no improvement over next 5-7 days can start augmentin. F/u if no improvement   Relevant Medications   amoxicillin-clavulanate (AUGMENTIN) 875-125 MG tablet   Grief       Packet of grief counselors given. Continue medication regimen additionally       Follow up plan: Return for as scheduled.

## 2017-08-22 NOTE — Patient Instructions (Signed)
Follow up as scheduled.  

## 2017-08-25 ENCOUNTER — Telehealth: Payer: Self-pay | Admitting: Family Medicine

## 2017-08-25 MED ORDER — FLUCONAZOLE 150 MG PO TABS: 150.0000 mg | ORAL_TABLET | Freq: Once | ORAL | 0 refills | Status: AC

## 2017-08-25 NOTE — Telephone Encounter (Signed)
Copied from [place] [id number]. Topic: Quick Communication - See Telephone Encounter >> Aug 25, 2017  3:07 PM Aurelio Brash B wrote: CRM for notification. See Telephone encounter for: 08/25/17.  Pt is asking for something to be called in to her pharmacy for yeast infection  She usually gets these after taking antibiotics   She is asking for the medication that is just one pill,  saying last time it worked great   [facility] - [place], [place], [place] - [place] [phone] (Phone) [phone] (Fax)

## 2017-09-20 ENCOUNTER — Ambulatory Visit (INDEPENDENT_AMBULATORY_CARE_PROVIDER_SITE_OTHER): Payer: Self-pay | Admitting: Family Medicine

## 2017-09-20 ENCOUNTER — Telehealth: Payer: Self-pay | Admitting: Family Medicine

## 2017-09-20 ENCOUNTER — Encounter: Payer: Self-pay | Admitting: Family Medicine

## 2017-09-20 VITALS — BP 138/84 | HR 71 | Temp 99.1°F | Wt 222.4 lb

## 2017-09-20 DIAGNOSIS — F3342 Major depressive disorder, recurrent, in full remission: Secondary | ICD-10-CM

## 2017-09-20 DIAGNOSIS — F4321 Adjustment disorder with depressed mood: Secondary | ICD-10-CM

## 2017-09-20 DIAGNOSIS — Z634 Disappearance and death of family member: Secondary | ICD-10-CM

## 2017-09-20 DIAGNOSIS — F4329 Adjustment disorder with other symptoms: Secondary | ICD-10-CM | POA: Insufficient documentation

## 2017-09-20 DIAGNOSIS — B372 Candidiasis of skin and nail: Secondary | ICD-10-CM

## 2017-09-20 MED ORDER — FLUOXETINE HCL 20 MG PO TABS
30.0000 mg | ORAL_TABLET | Freq: Two times a day (BID) | ORAL | 1 refills | Status: DC
Start: 2017-09-20 — End: 2017-11-14

## 2017-09-20 MED ORDER — CLONAZEPAM 1 MG PO TABS: 0.5000 mg | ORAL_TABLET | Freq: Two times a day (BID) | ORAL | 2 refills | Status: DC | PRN

## 2017-09-20 MED ORDER — NYSTATIN 100000 UNIT/GM EX POWD
Freq: Four times a day (QID) | CUTANEOUS | 3 refills | Status: DC
Start: 2017-09-20 — End: 2018-08-04

## 2017-09-20 NOTE — Patient Instructions (Signed)
Skin Yeast Infection Skin yeast infection is a condition in which there is an overgrowth of yeast (candida) that normally lives on the skin. This condition usually occurs in areas of the skin that are constantly warm and moist, such as the armpits or the groin. What are the causes? This condition is caused by a change in the normal balance of the yeast and bacteria that live on the skin. What increases the risk? This condition is more likely to develop in:  People who are obese.  Pregnant women.  Women who take birth control pills.  People who have diabetes.  People who take antibiotic medicines.  People who take steroid medicines.  People who are malnourished.  People who have a weak defense (immune) system.  People who are 65 years of age or older.  What are the signs or symptoms? Symptoms of this condition include:  A red, swollen area of the skin.  Bumps on the skin.  Itchiness.  How is this diagnosed? This condition is diagnosed with a medical history and physical exam. Your health care provider may check for yeast by taking light scrapings of the skin to be viewed under a microscope. How is this treated? This condition is treated with medicine. Medicines may be prescribed or be available over-the-counter. The medicines may be:  Taken by mouth (orally).  Applied as a cream.  Follow these instructions at home:  Take or apply over-the-counter and prescription medicines only as told by your health care provider.  Eat more yogurt. This may help to keep your yeast infection from returning.  Maintain a healthy weight. If you need help losing weight, talk with your health care provider.  Keep your skin clean and dry.  If you have diabetes, keep your blood sugar under control. Contact a health care provider if:  Your symptoms go away and then return.  Your symptoms do not get better with treatment.  Your symptoms get worse.  Your rash spreads.  You have a  fever or chills.  You have new symptoms.  You have new warmth or redness of your skin. This information is not intended to replace advice given to you by your health care provider. Make sure you discuss any questions you have with your health care provider. Document Released: 12/29/2010 Document Revised: 12/07/2015 Document Reviewed: 10/14/2014 Elsevier Interactive Patient Education  2018 [employer]  

## 2017-09-20 NOTE — Progress Notes (Signed)
BP 138/84 (BP Location: Left Arm, Patient Position: Sitting, Cuff Size: Large)   Pulse 71   Temp 99.1 F (37.3 C)   Wt 222 lb 6 oz (100.9 kg)   SpO2 99%   BMI 40.67 kg/m    Subjective:    Patient ID: Jaime Freeman, female    DOB: 1955-12-08, 62 y.o.   MRN: [id number]  HPI: Jaime Freeman is a 62 y.o. female  Chief Complaint  Patient presents with  . Anxiety  . Rash   ANXIETY/STRESS Duration:worse Anxious mood: yes  Excessive worrying: yes Irritability: no  Sweating: no Nausea: no Palpitations:no Hyperventilation: no Panic attacks: yes Agoraphobia: no  Obscessions/compulsions: no Depressed mood: yes Depression screen [facility] 2/9 09/20/2017 06/07/2017 10/13/2015 08/13/2015  Decreased Interest 2 0 0 0  Down, Depressed, Hopeless 3 1 0 0  PHQ - 2 Score 5 1 0 0  Altered sleeping 1 2 - -  Tired, decreased energy 3 2 - -  Change in appetite 1 3 - -  Feeling bad or failure about yourself  1 0 - -  Trouble concentrating 3 0 - -  Moving slowly or fidgety/restless 0 0 - -  Suicidal thoughts 0 0 - -  PHQ-9 Score 14 8 - -  Difficult doing work/chores - Somewhat difficult - -   GAD 7 : Generalized Anxiety Score 09/20/2017  Nervous, Anxious, on Edge 3  Control/stop worrying 2  Worry too much - different things 0  Trouble relaxing 2  Restless 1  Easily annoyed or irritable 0  Afraid - awful might happen 1  Total GAD 7 Score 9  Anxiety Difficulty Somewhat difficult   Anhedonia: no Weight changes: no Insomnia: no   Hypersomnia: no Fatigue/loss of energy: yes Feelings of worthlessness: yes Feelings of guilt: yes Impaired concentration/indecisiveness: no Suicidal ideations: no  Crying spells: no Recent Stressors/Life Changes: yes   Relationship problems: no   Family stress: yes     Financial stress: yes    Job stress: yes    Recent death/loss: yes  RASH Duration:  weeks  Location: groin  Itching: no Burning: yes Redness: yes Oozing: yes Scaling: yes Blisters:  no Painful: no Fevers: no Change in detergents/soaps/personal care products: no Recent illness: no Recent travel:no History of same: yes Context: worse Alleviating factors: monistat Treatments attempted: monistat Shortness of breath: no  Throat/tongue swelling: no Myalgias/arthralgias: no   Relevant past medical, surgical, family and social history reviewed and updated as indicated. Interim medical history since our last visit reviewed. Allergies and medications reviewed and updated.  Review of Systems  Constitutional: Negative.   Respiratory: Negative.   Cardiovascular: Negative.   Musculoskeletal: Negative.   Skin: Positive for rash. Negative for color change, pallor and wound.  Neurological: Negative.   Psychiatric/Behavioral: Negative.     Per HPI unless specifically indicated above     Objective:    BP 138/84 (BP Location: Left Arm, Patient Position: Sitting, Cuff Size: Large)   Pulse 71   Temp 99.1 F (37.3 C)   Wt 222 lb 6 oz (100.9 kg)   SpO2 99%   BMI 40.67 kg/m   Wt Readings from Last 3 Encounters:  09/20/17 222 lb 6 oz (100.9 kg)  08/19/17 222 lb 9.6 oz (101 kg)  07/29/17 231 lb 1 oz (104.8 kg)    Physical Exam  Constitutional: She is oriented to person, place, and time. She appears well-developed and well-nourished. No distress.  HENT:  Head: Normocephalic and atraumatic.  Right Ear: Hearing normal.  Left Ear: Hearing normal.  Nose: Nose normal.  Eyes: Conjunctivae and lids are normal. Right eye exhibits no discharge. Left eye exhibits no discharge. No scleral icterus.  Cardiovascular: Normal rate, regular rhythm, normal heart sounds and intact distal pulses. Exam reveals no gallop and no friction rub.  No murmur heard. Pulmonary/Chest: Effort normal and breath sounds normal. No stridor. No respiratory distress. She has no wheezes. She has no rales. She exhibits no tenderness.  Musculoskeletal: Normal range of motion.  Neurological: She is alert  and oriented to person, place, and time.  Skin: Skin is warm, dry and intact. Capillary refill takes less than 2 seconds. Rash (erythematous rash in the groin) noted. She is not diaphoretic. There is erythema. No pallor.  Psychiatric: Her speech is normal and behavior is normal. Judgment and thought content normal. Her mood appears anxious. Cognition and memory are normal. She exhibits a depressed mood.  Nursing note and vitals reviewed.   Results for orders placed or performed in visit on 06/07/17  Microscopic Examination  Result Value Ref Range   WBC, UA 0-5 0 - 5 /hpf   RBC, UA 0-2 0 - 2 /hpf   Epithelial Cells (non renal) 0-10 0 - 10 /hpf   Renal Epithel, UA 0-10 (A) None seen /hpf   Bacteria, UA Few None seen/Few  Bayer DCA Hb A1c Waived  Result Value Ref Range   HB A1C (BAYER DCA - WAIVED) 5.8 <7.0 %  CBC with Differential/Platelet  Result Value Ref Range   WBC 8.1 3.4 - 10.8 x10E3/uL   RBC 3.90 3.77 - 5.28 x10E6/uL   Hemoglobin 12.5 11.1 - 15.9 g/dL   Hematocrit 38.2 34.0 - 46.6 %   MCV 98 (H) 79 - 97 fL   MCH 32.1 26.6 - 33.0 pg   MCHC 32.7 31.5 - 35.7 g/dL   RDW 12.6 12.3 - 15.4 %   Platelets 373 150 - 379 x10E3/uL   Neutrophils 62 Not Estab. %   Lymphs 29 Not Estab. %   Monocytes 6 Not Estab. %   Eos 3 Not Estab. %   Basos 0 Not Estab. %   Neutrophils Absolute 5.0 1.4 - 7.0 x10E3/uL   Lymphocytes Absolute 2.3 0.7 - 3.1 x10E3/uL   Monocytes Absolute 0.5 0.1 - 0.9 x10E3/uL   EOS (ABSOLUTE) 0.2 0.0 - 0.4 x10E3/uL   Basophils Absolute 0.0 0.0 - 0.2 x10E3/uL   Immature Granulocytes 0 Not Estab. %   Immature Grans (Abs) 0.0 0.0 - 0.1 x10E3/uL  Comprehensive metabolic panel  Result Value Ref Range   Glucose 103 (H) 65 - 99 mg/dL   BUN 13 8 - 27 mg/dL   Creatinine, Ser 0.76 0.57 - 1.00 mg/dL   GFR calc non Af Amer 86 >59 mL/min/1.73   GFR calc Af Amer 99 >59 mL/min/1.73   BUN/Creatinine Ratio 17 12 - 28   Sodium 140 134 - 144 mmol/L   Potassium 5.0 3.5 - 5.2 mmol/L     Chloride 101 96 - 106 mmol/L   CO2 24 20 - 29 mmol/L   Calcium 10.0 8.7 - 10.3 mg/dL   Total Protein 6.5 6.0 - 8.5 g/dL   Albumin 4.1 3.6 - 4.8 g/dL   Globulin, Total 2.4 1.5 - 4.5 g/dL   Albumin/Globulin Ratio 1.7 1.2 - 2.2   Bilirubin Total 0.3 0.0 - 1.2 mg/dL   Alkaline Phosphatase 57 39 - 117 IU/L   AST 15 0 - 40 IU/L  ALT 14 0 - 32 IU/L  Lipid Panel w/o Chol/HDL Ratio  Result Value Ref Range   Cholesterol, Total 224 (H) 100 - 199 mg/dL   Triglycerides 169 (H) 0 - 149 mg/dL   HDL 57 >39 mg/dL   VLDL Cholesterol Cal 34 5 - 40 mg/dL   LDL Calculated 133 (H) 0 - 99 mg/dL  Microalbumin, Urine Waived  Result Value Ref Range   Microalb, Ur Waived 10 0 - 19 mg/L   Creatinine, Urine Waived 50 10 - 300 mg/dL   Microalb/Creat Ratio 30-300 (H) <30 mg/g  TSH  Result Value Ref Range   TSH 1.900 0.450 - 4.500 uIU/mL  UA/M w/rflx Culture, Routine  Result Value Ref Range   Specific Gravity, UA 1.015 1.005 - 1.030   pH, UA 7.0 5.0 - 7.5   Color, UA Yellow Yellow   Appearance Ur Hazy (A) Clear   Leukocytes, UA Trace (A) Negative   Protein, UA Negative Negative/Trace   Glucose, UA Negative Negative   Ketones, UA Negative Negative   RBC, UA Negative Negative   Bilirubin, UA Negative Negative   Urobilinogen, Ur 0.2 0.2 - 1.0 mg/dL   Nitrite, UA Negative Negative   Microscopic Examination See below:   VITAMIN D 25 Hydroxy (Vit-D Deficiency, Fractures)  Result Value Ref Range   Vit D, 25-Hydroxy 24.7 (L) 30.0 - 100.0 ng/mL  B12 and Folate Panel  Result Value Ref Range   Vitamin B-12 246 232 - 1,245 pg/mL   Folate 6.3 >3.0 ng/mL  Lithium level  Result Value Ref Range   Lithium Lvl 0.9 0.6 - 1.2 mmol/L  IGP, Aptima HPV, rfx 16/18,45  Result Value Ref Range   DIAGNOSIS: Comment    Specimen adequacy: Comment    Clinician Provided ICD10 Comment    Performed by: Comment    PAP Smear Comment .    Note: Comment    Test Methodology CANCELED    HPV Aptima Negative Negative       Assessment & Plan:   Problem List Items Addressed This Visit      Other   Depression    Not doing well. Will increase to 60mg  daily and recheck 3 months. Call with any concerns. Refill of her klonopin given.       Relevant Medications   FLUoxetine (PROZAC) 20 MG tablet   Grief - Primary    Not doing well. Will increase to 60mg  daily and recheck 3 months. Call with any concerns. Refill of her klonopin given.        Other Visit Diagnoses    Candidal intertrigo       Will treat with nystatin. Call if not getting better. Call with any concerns.    Relevant Medications   nystatin (MYCOSTATIN/NYSTOP) powder       Follow up plan: Return in about 3 months (around 12/21/2017) for 6 month follow up.

## 2017-09-20 NOTE — Assessment & Plan Note (Signed)
Not doing well. Will increase to 60mg  daily and recheck 3 months. Call with any concerns. Refill of her klonopin given.

## 2017-09-20 NOTE — Telephone Encounter (Signed)
LVM for pt to come in at 1:15 to see Dr Wynetta Emery.

## 2017-09-20 NOTE — Telephone Encounter (Signed)
That's fine- you can put her there.

## 2017-09-20 NOTE — Telephone Encounter (Signed)
Copied from [place] [id number]. Topic: Appointment Scheduling - Scheduling Inquiry for Clinic >> Sep 20, 2017  6:50 AM Lennox Solders wrote: Reason for CRM: pt has yeast inf and needs med review. Pt is requesting to see dr Wynetta Emery. Dr Wynetta Emery does not have any avail appt this week . >> Sep 20, 2017  7:35 AM Shaune Pollack wrote: Dr Wynetta Emery you have a 1:15-130 on hold today please advise if ok to put patient in for this time.  Thank you

## 2017-09-27 ENCOUNTER — Ambulatory Visit: Payer: Self-pay | Admitting: Family Medicine

## 2017-10-17 ENCOUNTER — Encounter: Payer: Self-pay | Admitting: Family Medicine

## 2017-10-17 ENCOUNTER — Ambulatory Visit (INDEPENDENT_AMBULATORY_CARE_PROVIDER_SITE_OTHER): Payer: Self-pay | Admitting: Family Medicine

## 2017-10-17 VITALS — BP 131/83 | HR 71 | Wt 214.1 lb

## 2017-10-17 DIAGNOSIS — R42 Dizziness and giddiness: Secondary | ICD-10-CM

## 2017-10-17 DIAGNOSIS — R29810 Facial weakness: Secondary | ICD-10-CM | POA: Insufficient documentation
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
## 2017-10-17 NOTE — Assessment & Plan Note (Signed)
Has been going on for 3-4 weeks. Has been very dizzy and feeling like she is walking off balance. Needs blood work and CT or MRI. Does not have any insurance. Will send her to ER for evaluation. Await results. Call with any concerns.

## 2017-10-18 ENCOUNTER — Other Ambulatory Visit: Payer: Self-pay

## 2017-10-18 ENCOUNTER — Emergency Department
Admission: EM | Admit: 2017-10-18 | Discharge: 2017-10-18 | Disposition: A | Discharge status: Home / Self Care | Payer: Self-pay | Attending: Emergency Medicine | Admitting: Emergency Medicine

## 2017-10-18 ENCOUNTER — Emergency Department: Payer: Self-pay

## 2017-10-18 DIAGNOSIS — R251 Tremor, unspecified: Secondary | ICD-10-CM | POA: Insufficient documentation

## 2017-10-18 DIAGNOSIS — I129 Hypertensive chronic kidney disease with stage 1 through stage 4 chronic kidney disease, or unspecified chronic kidney disease: Secondary | ICD-10-CM | POA: Insufficient documentation

## 2017-10-18 DIAGNOSIS — Z7982 Long term (current) use of aspirin: Secondary | ICD-10-CM | POA: Insufficient documentation

## 2017-10-18 DIAGNOSIS — N181 Chronic kidney disease, stage 1: Secondary | ICD-10-CM | POA: Insufficient documentation

## 2017-10-18 DIAGNOSIS — R29810 Facial weakness: Secondary | ICD-10-CM | POA: Insufficient documentation

## 2017-10-18 DIAGNOSIS — Z79899 Other long term (current) drug therapy: Secondary | ICD-10-CM | POA: Insufficient documentation

## 2017-10-18 DIAGNOSIS — Z87891 Personal history of nicotine dependence: Secondary | ICD-10-CM | POA: Insufficient documentation

## 2017-10-18 LAB — PROTIME-INR
INR: 1.02
Prothrombin Time: 13.3 s (ref 11.4–15.2)

## 2017-10-18 LAB — CBC
HCT: 39.9 % (ref 35.0–47.0)
Hemoglobin: 13.5 g/dL (ref 12.0–16.0)
MCH: 32.5 pg (ref 26.0–34.0)
MCHC: 33.9 g/dL (ref 32.0–36.0)
MCV: 95.8 fL (ref 80.0–100.0)
Platelets: 426 10*3/uL (ref 150–440)
RBC: 4.17 MIL/uL (ref 3.80–5.20)
RDW: 12.3 % (ref 11.5–14.5)
WBC: 10.5 10*3/uL (ref 3.6–11.0)

## 2017-10-18 LAB — COMPREHENSIVE METABOLIC PANEL WITH GFR
ALT: 16 U/L (ref 0–44)
AST: 21 U/L (ref 15–41)
Albumin: 4.3 g/dL (ref 3.5–5.0)
Alkaline Phosphatase: 69 U/L (ref 38–126)
Anion gap: 8 (ref 5–15)
BUN: 22 mg/dL — ABNORMAL HIGH (ref 6–20)
CO2: 24 mmol/L (ref 22–32)
Calcium: 10.1 mg/dL (ref 8.9–10.3)
Chloride: 101 mmol/L (ref 98–111)
Creatinine, Ser: 1.15 mg/dL — ABNORMAL HIGH (ref 0.44–1.00)
GFR calc Af Amer: 59 mL/min — ABNORMAL LOW
GFR calc non Af Amer: 51 mL/min — ABNORMAL LOW
Glucose, Bld: 106 mg/dL — ABNORMAL HIGH (ref 70–99)
Potassium: 3.7 mmol/L (ref 3.5–5.1)
Sodium: 133 mmol/L — ABNORMAL LOW (ref 135–145)
Total Bilirubin: 0.7 mg/dL (ref 0.3–1.2)
Total Protein: 7.4 g/dL (ref 6.5–8.1)

## 2017-10-18 LAB — DIFFERENTIAL
Basophils Absolute: 0.1 10*3/uL (ref 0–0.1)
Basophils Relative: 1 %
Eosinophils Absolute: 0.2 10*3/uL (ref 0–0.7)
Eosinophils Relative: 2 %
Lymphocytes Relative: 15 %
Lymphs Abs: 1.6 10*3/uL (ref 1.0–3.6)
Monocytes Absolute: 0.5 10*3/uL (ref 0.2–0.9)
Monocytes Relative: 5 %
Neutro Abs: 8.2 10*3/uL — ABNORMAL HIGH (ref 1.4–6.5)
Neutrophils Relative %: 77 %

## 2017-10-18 LAB — TROPONIN I: Troponin I: <0.03 ng/mL

## 2017-10-18 LAB — GLUCOSE, CAPILLARY: Glucose-Capillary: 102 mg/dL — ABNORMAL HIGH (ref 70–99)

## 2017-10-18 LAB — APTT: aPTT: 26 seconds (ref 24–36)

## 2017-10-18 MED ORDER — LORAZEPAM 2 MG/ML IJ SOLN
1.5000 mg | Freq: Once | INTRAMUSCULAR | Status: AC
  Administered 2017-10-18: 1.5 mg via INTRAVENOUS
  Filled 2017-10-18: qty 1

## 2017-10-18 NOTE — ED Notes (Signed)
Patient transported to MRI 

## 2017-10-18 NOTE — ED Provider Notes (Signed)
[facility] Emergency Department Provider Note   ____________________________________________   First MD Initiated Contact with Patient 10/18/17 1453     (approximate)  I have reviewed the triage vital signs and the nursing notes.   HISTORY  Chief Complaint Stroke Symptoms   HPI Jaime Freeman is a 62 y.o. female whose husband died recently.  She has been very upset about it and crying had to be put on some Klonopin to help her sleep.  She is also had some tremors kind of diffusely since then.  She went to see the doctor yesterday who thought she had a facial droop so she was sent here.  Patient may have a slight right-sided facial droop.  Her sister-in-law thinks she probably does have a slight facial droop.  She has no other symptoms.  The rest of her face forehead eyes etc. look normal she has no numbness or tingling anywhere   Past Medical History:  Diagnosis Date  . Allergic rhinitis   . Anxiety   . Bipolar affective disorder ([place])   . Depression   . Hiatal hernia   . Hypertension   . IFG (impaired fasting glucose)   . Menopausal state   . Morbid obesity ([place])   . OCD (obsessive compulsive disorder)   . Panic disorder   . Restrictive lung disease   . Vitamin B12 deficiency   . Vitamin D deficiency disease     Patient Active Problem List   Diagnosis Date Noted  . Facial droop 10/17/2017  . Grief 09/20/2017  . CKD (chronic kidney disease) stage 1, GFR 90 ml/min or greater 06/07/2017  . Panic attacks 08/13/2015  . Insomnia 08/13/2015  . Peripheral neuropathy 05/08/2015  . Osteoarthritis of both knees 12/25/2014  . OCD (obsessive compulsive disorder) 12/25/2014  . Encounter for lithium monitoring 12/25/2014  . Dyslipidemia 12/25/2014  . Kidney lesion 12/25/2014  . Fatty liver 12/25/2014  . Morbid obesity ([place])   . Menopausal state   . Benign hypertensive renal disease   . IFG (impaired fasting glucose)   . Vitamin D deficiency  disease   . Vitamin B12 deficiency   . Depression   . Anxiety   . Restrictive lung disease     Past Surgical History:  Procedure Laterality Date  . CESAREAN SECTION    . DILATION AND CURETTAGE OF UTERUS    . TONSILLECTOMY      Prior to Admission medications   Medication Sig Start Date End Date Taking? Authorizing Provider  albuterol (PROVENTIL HFA;VENTOLIN HFA) 108 (90 Base) MCG/ACT inhaler Inhale 2 puffs into the lungs every 4 (four) hours as needed for wheezing or shortness of breath. 06/07/17   Park Liter P, DO  aspirin EC 81 MG tablet Take 81 mg by mouth daily.    [provider]  benazepril (LOTENSIN) 20 MG tablet Take 1 tablet (20 mg total) by mouth daily. 06/07/17   Johnson, Megan P, DO  bisacodyl (DULCOLAX) 5 MG EC tablet Take 5 mg by mouth daily as needed for moderate constipation.    [provider]  clonazePAM (KLONOPIN) 1 MG tablet Take 0.5-1 tablets (0.5-1 mg total) by mouth 2 (two) times daily as needed for anxiety. 09/20/17   Johnson, Megan P, DO  etodolac (LODINE) 400 MG tablet Take 1 tablet (400 mg total) by mouth 2 (two) times daily as needed. 08/08/17   Johnson, Megan P, DO  FLUoxetine (PROZAC) 20 MG tablet Take 1.5 tablets (30 mg total) by mouth  2 (two) times daily. 09/20/17   Johnson, Megan P, DO  fluticasone (FLONASE) 50 MCG/ACT nasal spray Place 2 sprays into both nostrils daily. Reported on 05/08/2015 06/07/17   Park Liter P, DO  hydrochlorothiazide (MICROZIDE) 12.5 MG capsule Take 1 capsule (12.5 mg total) by mouth daily. 06/07/17   Park Liter P, DO  lithium carbonate 300 MG capsule Take 2 capsules (600 mg total) by mouth 2 (two) times daily. 06/07/17   Johnson, Megan P, DO  nystatin (MYCOSTATIN/NYSTOP) powder Apply topically 4 (four) times daily. 09/20/17   Johnson, Megan P, DO  omeprazole (PRILOSEC) 20 MG capsule Take 1 capsule (20 mg total) by mouth daily. 06/07/17   Park Liter P, DO    Allergies Codeine; Dynacirc [isradipine];  Guaifenesin & derivatives; Sudafed [pseudoephedrine]; Zithromax [azithromycin]; and Prednisone  Family History  Problem Relation Age of Onset  . Heart disease Mother   . Heart attack Mother   . Hypertension Mother   . Cancer Father        GI tract  . Hypertension Father   . Cancer Maternal Grandmother        gallbladder  . Heart disease Maternal Grandfather   . COPD Neg Hx   . Diabetes Neg Hx   . Stroke Neg Hx     Social History Social History   Tobacco Use  . Smoking status: Former Smoker    Types: Cigarettes    Last attempt to quit: 04/27/1987    Years since quitting: 30.4  . Smokeless tobacco: Never Used  Substance Use Topics  . Alcohol use: No  . Drug use: No    Review of Systems  Constitutional: No fever/chills Eyes: No visual changes. ENT: No sore throat. Cardiovascular: Denies chest pain. Respiratory: Denies shortness of breath. Gastrointestinal: No abdominal pain.  No nausea, no vomiting.  No diarrhea.  No constipation. Genitourinary: Negative for dysuria. Musculoskeletal: Negative for back pain. Skin: Negative for rash. Neurological: Negative for headaches, focal weakness   ____________________________________________   PHYSICAL EXAM:  VITAL SIGNS: ED Triage Vitals [10/18/17 1353]  Enc Vitals Group     BP (!) 115/51     Pulse Rate 76     Resp 18     Temp 99.3 F (37.4 C)     Temp Source Oral     SpO2 97 %     Weight 214 lb (97.1 kg)     Height 5\' 4"  (1.626 m)     Head Circumference      Peak Flow      Pain Score 0     Pain Loc      Pain Edu?      Excl. in [place]?     Constitutional: Alert and oriented. Well appearing and in no acute distress. Eyes: Conjunctivae are normal. PERRL. EOMI. Head: Atraumatic. Nose: No congestion/rhinnorhea. Mouth/Throat: Mucous membranes are moist.  Oropharynx non-erythematous. Neck: No stridor.   Cardiovascular: Normal rate, regular rhythm. Grossly normal heart sounds.  Good peripheral  circulation. Respiratory: Normal respiratory effort.  No retractions. Lungs CTAB. Gastrointestinal: Soft and nontender. No distention. No abdominal bruits. No CVA tenderness. Musculoskeletal: No lower extremity tenderness nor edema.  No joint effusions. Neurologic:  Normal speech and language.  Cranial nerves II through XII are intact except for as noted in HPI cerebellar finger-nose rapid alternating movements and hands are normal there is a slight tremor but it is not influenced by that action motor strength is 5/5 throughout sensation is intact throughout Skin:  Skin is  warm, dry and intact. No rash noted. Psychiatric: Mood and affect are normal. Speech and behavior are normal.  ____________________________________________   LABS (all labs ordered are listed, but only abnormal results are displayed)  Labs Reviewed  DIFFERENTIAL - Abnormal; Notable for the following components:      Result Value   Neutro Abs 8.2 (*)    All other components within normal limits  COMPREHENSIVE METABOLIC PANEL - Abnormal; Notable for the following components:   Sodium 133 (*)    Glucose, Bld 106 (*)    BUN 22 (*)    Creatinine, Ser 1.15 (*)    GFR calc non Af Amer 51 (*)    GFR calc Af Amer 59 (*)    All other components within normal limits  GLUCOSE, CAPILLARY - Abnormal; Notable for the following components:   Glucose-Capillary 102 (*)    All other components within normal limits  PROTIME-INR  APTT  CBC  TROPONIN I  CBG MONITORING, ED   ____________________________________________  EKG  EKG read and interpreted by me shows normal sinus rhythm rate of 76 left axis nonspecific ST-T wave changes ____________________________________________  RADIOLOGY  ED MD interpretation: CT read by radiology no acute disease  Official radiology report(s): Ct Head Wo Contrast  Result Date: 10/18/2017 CLINICAL DATA:  Recent head injury, falls, headache EXAM: CT HEAD WITHOUT CONTRAST TECHNIQUE:  Contiguous axial images were obtained from the base of the skull through the vertex without intravenous contrast. COMPARISON:  None. FINDINGS: Brain: No evidence of acute infarction, hemorrhage, hydrocephalus, extra-axial collection or mass lesion/mass effect. Vascular: No hyperdense vessel or unexpected calcification. Skull: Normal. Negative for fracture or focal lesion. Sinuses/Orbits: No acute finding. Other: None. IMPRESSION: Normal head CT without contrast Electronically Signed   By: Jerilynn Mages.  Shick M.D.   On: 10/18/2017 14:35   Mr Brain Wo Contrast  Result Date: 10/18/2017 CLINICAL DATA:  Left facial droop.  Dizziness. EXAM: MRI HEAD WITHOUT CONTRAST TECHNIQUE: Multiplanar, multiecho pulse sequences of the brain and surrounding structures were obtained without intravenous contrast. COMPARISON:  Head CT 10/18/2017 FINDINGS: Brain: There is no evidence of acute infarct, intracranial hemorrhage, mass, midline shift, or extra-axial fluid collection. The ventricles and sulci are normal. Scattered punctate foci of T2 hyperintensity in the cerebral white matter bilaterally are nonspecific but compatible with minimal chronic small vessel ischemic disease. Vascular: Major intracranial vascular flow voids are preserved. Skull and upper cervical spine: Unremarkable bone marrow signal. Sinuses/Orbits: Unremarkable orbits. Near complete opacification of the left maxillary sinus by fluid. Clear mastoid air cells. Other: None. IMPRESSION: 1. No acute intracranial abnormality. 2. Minimal chronic small vessel ischemic disease. 3. Left maxillary sinus fluid, correlate for acute sinusitis. Electronically Signed   By: Logan Bores M.D.   On: 10/18/2017 17:41    ____________________________________________   PROCEDURES  Procedure(s) performed:   Procedures  Critical Care performed:   ____________________________________________   INITIAL IMPRESSION / ASSESSMENT AND PLAN / ED COURSE  Both patient's MRI and CT are  negative.  Patient does not think she has any facial droop.  I am uncertain as to whether she has any facial droop or not when she smiles her picture looks exactly the same as her driver's license.  I will have her follow-up with neurology for the tremor.  She can return if worse.  There is some sinusitis on the CT scan but she is not having any symptoms.  There is no redness tenderness pain or any thing else there.  I will not  treat that.  If there is a little bit of swelling from that it may explain her symptoms.         ____________________________________________   FINAL CLINICAL IMPRESSION(S) / ED DIAGNOSES  Final diagnoses:  Tremor     ED Discharge Orders    None       Note:  This document was prepared using Dragon voice recognition software and may include unintentional dictation errors.    Nena Polio, MD 10/18/17 [id number]

## 2017-10-18 NOTE — ED Notes (Signed)

## 2017-10-18 NOTE — Discharge Instructions (Addendum)
Both the CT scan and the MRI were normal.  He did not show any sign of a stroke.  I would like you to follow-up with a neurologist for the tremor.  Please call either Dr. Melrose Nakayama or Dr. Manuella Ghazi.  Let the office staff know that you were seen in the emergency room.  They should be able to work nightly fairly rapidly.  Please return for any new or worsening symptoms.

## 2017-10-18 NOTE — ED Triage Notes (Addendum)
Pt reports tremors, falls. Pt reports that the left sided facial droop she has now is her normal but the doctor she saw yesterday thought that it was new. Extremities have equal strength. Pt alert and oriented to place, situation and person, active, cooperative, pt in NAD. RR even and unlabored, color WNL.   Disoriented to time, believes it is 64.

## 2017-11-04 ENCOUNTER — Encounter: Payer: Self-pay | Admitting: Family Medicine

## 2017-11-04 ENCOUNTER — Other Ambulatory Visit: Payer: Self-pay

## 2017-11-04 ENCOUNTER — Ambulatory Visit: Payer: Self-pay | Admitting: Family Medicine

## 2017-11-04 VITALS — BP 141/81 | HR 64 | Temp 97.2°F | Ht 62.0 in | Wt 207.0 lb

## 2017-11-04 DIAGNOSIS — R42 Dizziness and giddiness: Secondary | ICD-10-CM

## 2017-11-04 DIAGNOSIS — F4321 Adjustment disorder with depressed mood: Secondary | ICD-10-CM

## 2017-11-04 DIAGNOSIS — Z634 Disappearance and death of family member: Secondary | ICD-10-CM

## 2017-11-04 DIAGNOSIS — F332 Major depressive disorder, recurrent severe without psychotic features: Secondary | ICD-10-CM

## 2017-11-04 DIAGNOSIS — R634 Abnormal weight loss: Secondary | ICD-10-CM

## 2017-11-04 DIAGNOSIS — F4329 Adjustment disorder with other symptoms: Secondary | ICD-10-CM

## 2017-11-04 NOTE — Progress Notes (Signed)
BP (!) 141/81   Pulse 64   Temp (!) 97.2 F (36.2 C) (Oral)   Ht 5\' 2"  (1.575 m)   Wt 207 lb (93.9 kg)   SpO2 100%   BMI 37.86 kg/m    Subjective:    Patient ID: Jaime Freeman, female    DOB: 1955/07/15, 61 y.o.   MRN: [id number]  HPI: Jaime Freeman is a 62 y.o. female  Chief Complaint  Patient presents with  . Follow-up    pt states she has had an MRI and CT scan   Not feeling well at all. Feeling shakey and dizzy and wobbly, feet feel off balance. All happens at the same time. Happening all the time. Has not been eating. Looking back over office visits has lost at least 45lbs in the last 6 months since her husband died. She has not been eating what she should. She notes that she is just not hungry- has been cutting down on the amount of food that she has been eating. She continues to feel very very sad. She grieves her husband hard, and cannot focus. She is not feeling any better. If anything, she is feeling worse. She doesn't think her medicines are helping her at all.  Depression screen [facility] 2/9 11/04/2017 10/17/2017 09/20/2017 06/07/2017 10/13/2015  Decreased Interest 3 3 2  0 0  Down, Depressed, Hopeless 3 3 3 1  0  PHQ - 2 Score 6 6 5 1  0  Altered sleeping 3 2 1 2  -  Tired, decreased energy 3 3 3 2  -  Change in appetite 3 3 1 3  -  Feeling bad or failure about yourself  3 0 1 0 -  Trouble concentrating 3 3 3  0 -  Moving slowly or fidgety/restless 3 3 0 0 -  Suicidal thoughts 0 0 0 0 -  PHQ-9 Score 24 20 14 8  -  Difficult doing work/chores Extremely dIfficult Very difficult - Somewhat difficult -   GAD 7 : Generalized Anxiety Score 11/04/2017 10/17/2017 09/20/2017  Nervous, Anxious, on Edge 3 3 3   Control/stop worrying 3 3 2   Worry too much - different things 3 3 0  Trouble relaxing 3 3 2   Restless 3 3 1   Easily annoyed or irritable 3 3 0  Afraid - awful might happen 3 3 1   Total GAD 7 Score 21 21 9   Anxiety Difficulty Extremely difficult Very difficult Somewhat difficult         Relevant past medical, surgical, family and social history reviewed and updated as indicated. Interim medical history since our last visit reviewed. Allergies and medications reviewed and updated.  Review of Systems  Constitutional: Positive for appetite change, fatigue and unexpected weight change. Negative for activity change, chills, diaphoresis and fever.  Respiratory: Negative.   Cardiovascular: Negative.   Gastrointestinal: Negative.   Skin: Negative.   Psychiatric/Behavioral: Positive for dysphoric mood. Negative for agitation, behavioral problems, confusion, decreased concentration, hallucinations, self-injury, sleep disturbance and suicidal ideas. The patient is nervous/anxious. The patient is not hyperactive.     Per HPI unless specifically indicated above     Objective:    BP (!) 141/81   Pulse 64   Temp (!) 97.2 F (36.2 C) (Oral)   Ht 5\' 2"  (1.575 m)   Wt 207 lb (93.9 kg)   SpO2 100%   BMI 37.86 kg/m   Wt Readings from Last 3 Encounters:  11/04/17 207 lb (93.9 kg)  10/18/17 214 lb (97.1 kg)  10/17/17 214  lb 2 oz (97.1 kg)    Physical Exam  Constitutional: She is oriented to person, place, and time. She appears well-developed and well-nourished. No distress.  HENT:  Head: Normocephalic and atraumatic.  Right Ear: Hearing normal.  Left Ear: Hearing normal.  Nose: Nose normal.  Eyes: Conjunctivae and lids are normal. Right eye exhibits no discharge. Left eye exhibits no discharge. No scleral icterus.  Cardiovascular: Normal rate, regular rhythm, normal heart sounds and intact distal pulses. Exam reveals no gallop and no friction rub.  No murmur heard. Pulmonary/Chest: Effort normal and breath sounds normal. No stridor. No respiratory distress. She has no wheezes. She has no rales. She exhibits no tenderness.  Musculoskeletal: Normal range of motion.  Neurological: She is alert and oriented to person, place, and time.  Skin: Skin is warm, dry and  intact. Capillary refill takes less than 2 seconds. No rash noted. She is not diaphoretic. No erythema. No pallor.  Psychiatric: Her speech is normal and behavior is normal. Judgment and thought content normal. Her mood appears anxious. Cognition and memory are normal. She exhibits a depressed mood.  Nursing note and vitals reviewed.   Results for orders placed or performed during the hospital encounter of 10/18/17  Protime-INR  Result Value Ref Range   Prothrombin Time 13.3 11.4 - 15.2 seconds   INR 1.02   APTT  Result Value Ref Range   aPTT 26 24 - 36 seconds  CBC  Result Value Ref Range   WBC 10.5 3.6 - 11.0 K/uL   RBC 4.17 3.80 - 5.20 MIL/uL   Hemoglobin 13.5 12.0 - 16.0 g/dL   HCT 39.9 35.0 - 47.0 %   MCV 95.8 80.0 - 100.0 fL   MCH 32.5 26.0 - 34.0 pg   MCHC 33.9 32.0 - 36.0 g/dL   RDW 12.3 11.5 - 14.5 %   Platelets 426 150 - 440 K/uL  Differential  Result Value Ref Range   Neutrophils Relative % 77 %   Neutro Abs 8.2 (H) 1.4 - 6.5 K/uL   Lymphocytes Relative 15 %   Lymphs Abs 1.6 1.0 - 3.6 K/uL   Monocytes Relative 5 %   Monocytes Absolute 0.5 0.2 - 0.9 K/uL   Eosinophils Relative 2 %   Eosinophils Absolute 0.2 0 - 0.7 K/uL   Basophils Relative 1 %   Basophils Absolute 0.1 0 - 0.1 K/uL  Comprehensive metabolic panel  Result Value Ref Range   Sodium 133 (L) 135 - 145 mmol/L   Potassium 3.7 3.5 - 5.1 mmol/L   Chloride 101 98 - 111 mmol/L   CO2 24 22 - 32 mmol/L   Glucose, Bld 106 (H) 70 - 99 mg/dL   BUN 22 (H) 6 - 20 mg/dL   Creatinine, Ser 1.15 (H) 0.44 - 1.00 mg/dL   Calcium 10.1 8.9 - 10.3 mg/dL   Total Protein 7.4 6.5 - 8.1 g/dL   Albumin 4.3 3.5 - 5.0 g/dL   AST 21 15 - 41 U/L   ALT 16 0 - 44 U/L   Alkaline Phosphatase 69 38 - 126 U/L   Total Bilirubin 0.7 0.3 - 1.2 mg/dL   GFR calc non Af Amer 51 (L) >60 mL/min   GFR calc Af Amer 59 (L) >60 mL/min   Anion gap 8 5 - 15  Troponin I  Result Value Ref Range   Troponin I <0.03 <0.03 ng/mL  Glucose,  capillary  Result Value Ref Range   Glucose-Capillary 102 (H) 70 -  99 mg/dL   Comment 1 Notify RN       Assessment & Plan:   Problem List Items Addressed This Visit      Other   Depression - Primary    Not under good control. Significantly exacerbated with her husband's death. Not eating. Has lost >45lbs in the last 6 months. Will continue prozac and lithium. Will start seroquel. Information about [facility] given today- she will go with her sister in law today for an intake.       Complicated grief    Under very poor control. Not eating. Has lost >45lbs in the last 6 months. Will continue prozac and lithium. Will start seroquel. Information about [facility] given today- she will go with her sister in law today for an intake.        Other Visit Diagnoses    Dizziness       Seems to be due to her depression and grief. Will get that treated. Referral to [facility] today. Call with any concerns.    Weight loss       Seems to be due to her depression and grief. Will get that treated. Referral to [place] today. Call with any concerns.        Follow up plan: Return in about 1 month (around 12/02/2017) for follow up after meeting with [facility].

## 2017-11-06 ENCOUNTER — Encounter: Payer: Self-pay | Admitting: Family Medicine

## 2017-11-06 NOTE — Assessment & Plan Note (Signed)
Under very poor control. Not eating. Has lost >45lbs in the last 6 months. Will continue prozac and lithium. Will start seroquel. Information about [facility] given today- she will go with her sister in law today for an intake.

## 2017-11-06 NOTE — Assessment & Plan Note (Signed)
Not under good control. Significantly exacerbated with her husband's death. Not eating. Has lost >45lbs in the last 6 months. Will continue prozac and lithium. Will start seroquel. Information about [facility] given today- she will go with her sister in law today for an intake.

## 2017-11-07 ENCOUNTER — Ambulatory Visit (INDEPENDENT_AMBULATORY_CARE_PROVIDER_SITE_OTHER): Payer: Self-pay | Admitting: Physician Assistant

## 2017-11-07 ENCOUNTER — Encounter: Payer: Self-pay | Admitting: Physician Assistant

## 2017-11-07 ENCOUNTER — Telehealth: Payer: Self-pay | Admitting: Family Medicine

## 2017-11-07 VITALS — BP 105/67 | HR 77 | Temp 97.6°F | Ht 62.0 in | Wt 205.7 lb

## 2017-11-07 DIAGNOSIS — F332 Major depressive disorder, recurrent severe without psychotic features: Secondary | ICD-10-CM

## 2017-11-07 MED ORDER — QUETIAPINE FUMARATE 25 MG PO TABS: 25.0000 mg | ORAL_TABLET | Freq: Every day | ORAL | 0 refills | Status: DC

## 2017-11-07 NOTE — Progress Notes (Signed)
Subjective:    Patient ID: Jaime Freeman, female    DOB: 09-15-1955, 62 y.o.   MRN: [id number]  Jaime Freeman is a 62 y.o. female presenting on 11/07/2017 for Shaking; Sleeping Problem; and Fatigue   HPI   Jaime Freeman is a 62 y/o woman presenting today for multiple issues. She has a history of depression currently on Prozac 60 mg QD, lithium 300 mg BID.  She is having shaking, fatigue, sleep issues. Her husband of 40 years passed away on 07/23/2022. She reports a lot of guilt related to his as he was very ill and he seemed to have died in front of her, without her being able to perform CPR. She feels very guilty about this. It seems her issues have emerged relating to this. She was seen in this family medicine clinic on 10/17/2017 for shaking and dizziness. Sent to the ER where MRI and CT scan were normal but for some sinusitis, of which she has no symptoms.   She presents today with her daughter in law. She has moved in with her son since the death of her husband. There has been an ~ 20 lb weight loss. She reports having started a diet last year but also decreased appetite. She reports extreme daytime fatigue. In March, she was started on klonopin, which she has been taking 1 mg at night and sometimes 1/2 tablet in the morning. She also reports shaking in her extremities, does not report LOC or confusion during these times. She has also reported daytime nausea today. She is taking her etodolac daily in the morning on a mostly empty stomach. Daughter in law has been giving protein shakes for nutrition as patient is not eating as much.   Her daughter in law reports she has memory issues. For instance, when asked how long she was married to her husband, the patient says 50 years, despite the patient herself being 25. When prompted, she insists it has been 62 years. Her daughter in law reminds of her of the time and at this point she does agree with the length of time being 40 years, becomes tearful.     Social History   Tobacco Use  . Smoking status: Former Smoker    Types: Cigarettes    Last attempt to quit: 04/27/1987    Years since quitting: 30.5  . Smokeless tobacco: Never Used  Substance Use Topics  . Alcohol use: No  . Drug use: No    Review of Systems Per HPI unless specifically indicated above     Objective:    BP 105/67 (BP Location: Right Arm, Patient Position: Sitting, Cuff Size: Normal)   Pulse 77   Temp 97.6 F (36.4 C) (Oral)   Ht 5\' 2"  (1.575 m)   Wt 205 lb 11.2 oz (93.3 kg)   SpO2 98%   BMI 37.62 kg/m   Wt Readings from Last 3 Encounters:  11/07/17 205 lb 11.2 oz (93.3 kg)  11/04/17 207 lb (93.9 kg)  10/18/17 214 lb (97.1 kg)    Physical Exam  Constitutional: She is oriented to person, place, and time. She appears well-developed and well-nourished.  Becomes extremely tearful when talking about her husband's death. Begins to shake as she does this.   Cardiovascular: Normal rate and regular rhythm.  Pulmonary/Chest: Effort normal and breath sounds normal.  Neurological: She is alert and oriented to person, place, and time. She displays tremor.  Reflex Scores:      Achilles reflexes  are 0 on the left side. There is perhaps a slight baseline tremor in both upper extremities that worsens with intentional movement, such as drinking a water bottle as she does this in clinic.   Skin: Skin is warm and dry.  Psychiatric: Her speech is normal and behavior is normal. Judgment normal. Her mood appears anxious. Cognition and memory are impaired. She exhibits a depressed mood. She exhibits abnormal recent memory.  She appears very sad and anxious. Tearful multiple time's in office visit. However, her speech is very appropriate. She does seem to have some short term memory loss, repeatedly asks about medications that were just explained.    Results for orders placed or performed during the hospital encounter of 10/18/17  Protime-INR  Result Value Ref Range    Prothrombin Time 13.3 11.4 - 15.2 seconds   INR 1.02   APTT  Result Value Ref Range   aPTT 26 24 - 36 seconds  CBC  Result Value Ref Range   WBC 10.5 3.6 - 11.0 K/uL   RBC 4.17 3.80 - 5.20 MIL/uL   Hemoglobin 13.5 12.0 - 16.0 g/dL   HCT 39.9 35.0 - 47.0 %   MCV 95.8 80.0 - 100.0 fL   MCH 32.5 26.0 - 34.0 pg   MCHC 33.9 32.0 - 36.0 g/dL   RDW 12.3 11.5 - 14.5 %   Platelets 426 150 - 440 K/uL  Differential  Result Value Ref Range   Neutrophils Relative % 77 %   Neutro Abs 8.2 (H) 1.4 - 6.5 K/uL   Lymphocytes Relative 15 %   Lymphs Abs 1.6 1.0 - 3.6 K/uL   Monocytes Relative 5 %   Monocytes Absolute 0.5 0.2 - 0.9 K/uL   Eosinophils Relative 2 %   Eosinophils Absolute 0.2 0 - 0.7 K/uL   Basophils Relative 1 %   Basophils Absolute 0.1 0 - 0.1 K/uL  Comprehensive metabolic panel  Result Value Ref Range   Sodium 133 (L) 135 - 145 mmol/L   Potassium 3.7 3.5 - 5.1 mmol/L   Chloride 101 98 - 111 mmol/L   CO2 24 22 - 32 mmol/L   Glucose, Bld 106 (H) 70 - 99 mg/dL   BUN 22 (H) 6 - 20 mg/dL   Creatinine, Ser 1.15 (H) 0.44 - 1.00 mg/dL   Calcium 10.1 8.9 - 10.3 mg/dL   Total Protein 7.4 6.5 - 8.1 g/dL   Albumin 4.3 3.5 - 5.0 g/dL   AST 21 15 - 41 U/L   ALT 16 0 - 44 U/L   Alkaline Phosphatase 69 38 - 126 U/L   Total Bilirubin 0.7 0.3 - 1.2 mg/dL   GFR calc non Af Amer 51 (L) >60 mL/min   GFR calc Af Amer 59 (L) >60 mL/min   Anion gap 8 5 - 15  Troponin I  Result Value Ref Range   Troponin I <0.03 <0.03 ng/mL  Glucose, capillary  Result Value Ref Range   Glucose-Capillary 102 (H) 70 - 99 mg/dL   Comment 1 Notify RN       Assessment & Plan:   1. Severe episode of recurrent major depressive disorder, without psychotic features ([place])  Have had extensive conversation with patient and her daughter in law. At this point, she does not appear to have any focal neurological symptoms. Scans 2 weeks ago negative for acute intracranial abnormality. Her shaking seems to be in direct  relation to her emotional state, as evidenced in clinic when she becomes tearful  talking about her husband and begins to shake all over. She does perhaps have a slight baseline tremor that worsens with intentional movement. For this, I have offered neurology referral, but patient would like to wait and see if it improves with emotional state which I think is reasonable.  I think her fatigue and sleep issues are two fold: she has extreme unadressed grief. She was directed to [facility] and did go to the building but left before being seen because she didn't feel comfortable. Additionally, she has been taking Klonopin 1 mg nightly and sometimes 1/2 tablet in the morning, has never been taking this before. She may be having some sedative effects from this and have instructed her to decrease the medication this medication to 1/2 tablet nightly over the next two weeks and then to 1/4 tablet nightly the two weeks thereafter.   From review of prior visit on 11/04/2017, patient was started on Seroquel though I do not see where this was sent in and patient says she has not started it. In light of her increasing depression, will send this in as discussed.   Have also discussed that approach to treating patient's grief is two pronged and should also involve counseling or a support group. Patient adamantly denies SI/HI, though she continues to lose weight. I have suggested that she contact [facility] which offers free grief counseling for people experiencing a death in the past year. I have also discussed with her that if her grief cannot be managed to the point where she is functional, she may need to see a psychiatrist/personal counseling and this is a valid option. She voices that she would like to see a Economist. I have told her I know of only one in [place] but this will likely be hard to find. She will have an easier time finding a Marketing executive and we may pursue this if [facility]osp[facility]  does not work out.   We did discuss the possibility of her lithium levels being out of range, however her symptoms are intermittent and seem very closely related to her emotional state. As it is after five at the conclusion of the visit, we have agreed that it is OK to wait until her next appointment to draw labwork.  She should follow up in one week with Dr. Wynetta Emery to assess medication side effects and dose titration. I have sent in a very small dose of Seroquel to be taken at night for depression and sleep issues. This is to be taken in addition to her other medications, not in place of.   - QUEtiapine (SEROQUEL) 25 MG tablet; Take 1 tablet (25 mg total) by mouth at bedtime.  Dispense: 30 tablet; Refill: 0    Follow up plan: Return in about 1 week (around 11/14/2017) for Dr. Wynetta Emery, grief and depression .  A total of 40  minutes was spent face-to-face with this patient. Greater than 50% of this time was spent in counseling and coordination of care with the patient.  Nobie Putnam, [facility] 11/08/2017, 11:55 AM

## 2017-11-07 NOTE — Telephone Encounter (Signed)
Pt seeing Fabio Bering this afternoon

## 2017-11-07 NOTE — Patient Instructions (Addendum)
Klonopin/Clonazpeam: Reduce to half tablet at night for one or two weeks, 1/4 tab at night for one or two weeks and then discontinue We are adding Seroquel 25 mg nightly for depression, this is in ADDITION to other medications. [facility]

## 2017-11-07 NOTE — Telephone Encounter (Signed)
Copied from [place] [id number]. Topic: General - Other >> Nov 07, 2017  1:48 PM Gardiner Ramus wrote: Reason for CRM:pt daughter in law is called and saying that her mother in law is acting very depressed. Pt was not on the phone and I do not see a dpr that states that we can talk to her. Daughter in-law name is Office manager  . She would like a call back and states mother-in-law will give permission to speak with her. Please advise. Cb# [phone] she was wandering if the medication is affecting her

## 2017-11-08 ENCOUNTER — Telehealth: Payer: Self-pay | Admitting: Physician Assistant

## 2017-11-08 DIAGNOSIS — Z79899 Other long term (current) drug therapy: Secondary | ICD-10-CM

## 2017-11-08 NOTE — Telephone Encounter (Addendum)
Message relayed to patient. Verbalized understanding and denied questions.    August 5th pt has appointment w/ grief counselor.

## 2017-11-08 NOTE — Telephone Encounter (Signed)
Daughter in law called to discuss patients sedation and confusion was wondering if lithium had been increased. After review dose has not been increased since 2016. However, daughter in law took over medication last week. She is unsure that patient had been taking medication appropriately. Thinks she may have been skipping her morning dose because she said it upset her abdomen. Could this sudden change from 300 mg lithium daily to BID be causing the issues? Please advise.

## 2017-11-08 NOTE — Telephone Encounter (Signed)
Doroteo Bradford, pts daughter in law, called and would like to know if the clonazepam is the medication that is making the pt "off". She stated that last night the pt was out of it. She took half of the clonazepam but she just wondered how long it would like for her to see a difference.

## 2017-11-08 NOTE — Telephone Encounter (Signed)
Yes, we did discuss in clinic that she may be experiencing some sedation and confusion with the medication clonazepam or Klonopin. There is a risk of sedation and confusion with each dose that could happen with a lower dose, but this medication should be discontinued slower than most because there can be some symptoms of stopping it too quickly.

## 2017-11-09 NOTE — Telephone Encounter (Signed)
Daughter in law would like to know if they could also check glucose in addition to lithium level. Will probably come in on Friday morning. Daughter in law to speak w/ patient and patients son and call back in regards to psychiatry referral.

## 2017-11-09 NOTE — Telephone Encounter (Signed)
Dr.Arrti Nicolasa Ducking the first visit is 300.00 after that 125-165

## 2017-11-09 NOTE — Telephone Encounter (Signed)
It could be if she just changed the dose. Can we please get her in for a lab draw (order in) Can you also check to see if she has insurance now? It looks like she might- if this is the case, can we see if Dr. Nicolasa Ducking takes her insurance? I'd really like her to see her if she can.

## 2017-11-09 NOTE — Addendum Note (Signed)
Addended by: Valerie Roys on: 11/09/2017 09:22 AM   Modules accepted: Orders

## 2017-11-09 NOTE — Telephone Encounter (Signed)
Please let daughter in law know this- I can refer her if she'd like. I think she'd like Dr. Nicolasa Ducking

## 2017-11-09 NOTE — Addendum Note (Signed)
Addended by: Valerie Roys on: 11/09/2017 10:59 AM   Modules accepted: Orders

## 2017-11-10 ENCOUNTER — Emergency Department: Payer: No Typology Code available for payment source

## 2017-11-10 ENCOUNTER — Other Ambulatory Visit: Payer: Self-pay

## 2017-11-10 ENCOUNTER — Inpatient Hospital Stay
Admission: EM | Admit: 2017-11-10 | Discharge: 2017-11-14 | DRG: 917 | Disposition: A | Discharge status: Home / Self Care | Payer: No Typology Code available for payment source | Attending: Internal Medicine | Admitting: Internal Medicine

## 2017-11-10 DIAGNOSIS — Z885 Allergy status to narcotic agent status: Secondary | ICD-10-CM

## 2017-11-10 DIAGNOSIS — J984 Other disorders of lung: Secondary | ICD-10-CM | POA: Diagnosis present

## 2017-11-10 DIAGNOSIS — F4321 Adjustment disorder with depressed mood: Secondary | ICD-10-CM

## 2017-11-10 DIAGNOSIS — K219 Gastro-esophageal reflux disease without esophagitis: Secondary | ICD-10-CM | POA: Diagnosis present

## 2017-11-10 DIAGNOSIS — Z888 Allergy status to other drugs, medicaments and biological substances status: Secondary | ICD-10-CM

## 2017-11-10 DIAGNOSIS — G92 Toxic encephalopathy: Secondary | ICD-10-CM | POA: Diagnosis present

## 2017-11-10 DIAGNOSIS — J309 Allergic rhinitis, unspecified: Secondary | ICD-10-CM | POA: Diagnosis present

## 2017-11-10 DIAGNOSIS — Z881 Allergy status to other antibiotic agents status: Secondary | ICD-10-CM

## 2017-11-10 DIAGNOSIS — E860 Dehydration: Secondary | ICD-10-CM

## 2017-11-10 DIAGNOSIS — F319 Bipolar disorder, unspecified: Secondary | ICD-10-CM | POA: Diagnosis present

## 2017-11-10 DIAGNOSIS — R262 Difficulty in walking, not elsewhere classified: Secondary | ICD-10-CM | POA: Diagnosis present

## 2017-11-10 DIAGNOSIS — F329 Major depressive disorder, single episode, unspecified: Secondary | ICD-10-CM | POA: Diagnosis present

## 2017-11-10 DIAGNOSIS — Z79899 Other long term (current) drug therapy: Secondary | ICD-10-CM

## 2017-11-10 DIAGNOSIS — F410 Panic disorder [episodic paroxysmal anxiety] without agoraphobia: Secondary | ICD-10-CM | POA: Diagnosis present

## 2017-11-10 DIAGNOSIS — I10 Essential (primary) hypertension: Secondary | ICD-10-CM | POA: Diagnosis present

## 2017-11-10 DIAGNOSIS — R0602 Shortness of breath: Secondary | ICD-10-CM | POA: Diagnosis not present

## 2017-11-10 DIAGNOSIS — G8929 Other chronic pain: Secondary | ICD-10-CM | POA: Diagnosis present

## 2017-11-10 DIAGNOSIS — T56891A Toxic effect of other metals, accidental (unintentional), initial encounter: Principal | ICD-10-CM | POA: Diagnosis present

## 2017-11-10 DIAGNOSIS — N3000 Acute cystitis without hematuria: Secondary | ICD-10-CM

## 2017-11-10 DIAGNOSIS — F32A Depression, unspecified: Secondary | ICD-10-CM | POA: Diagnosis present

## 2017-11-10 DIAGNOSIS — F429 Obsessive-compulsive disorder, unspecified: Secondary | ICD-10-CM | POA: Diagnosis present

## 2017-11-10 DIAGNOSIS — N179 Acute kidney failure, unspecified: Secondary | ICD-10-CM | POA: Diagnosis present

## 2017-11-10 DIAGNOSIS — E876 Hypokalemia: Secondary | ICD-10-CM | POA: Diagnosis present

## 2017-11-10 DIAGNOSIS — Z87891 Personal history of nicotine dependence: Secondary | ICD-10-CM

## 2017-11-10 DIAGNOSIS — Z7982 Long term (current) use of aspirin: Secondary | ICD-10-CM

## 2017-11-10 DIAGNOSIS — Z6838 Body mass index (BMI) 38.0-38.9, adult: Secondary | ICD-10-CM

## 2017-11-10 HISTORY — DX: Toxic effect of other metals, accidental (unintentional), initial encounter: T56.891A

## 2017-11-10 LAB — COMPREHENSIVE METABOLIC PANEL
ALBUMIN: 4 g/dL (ref 3.5–5.0)
ALT: 19 U/L (ref 0–44)
ANION GAP: 10 (ref 5–15)
AST: 22 U/L (ref 15–41)
Alkaline Phosphatase: 73 U/L (ref 38–126)
BILIRUBIN TOTAL: 0.8 mg/dL (ref 0.3–1.2)
BUN: 26 mg/dL — ABNORMAL HIGH (ref 8–23)
CO2: 22 mmol/L (ref 22–32)
Calcium: 10.1 mg/dL (ref 8.9–10.3)
Chloride: 102 mmol/L (ref 98–111)
Creatinine, Ser: 1.35 mg/dL — ABNORMAL HIGH (ref 0.44–1.00)
GFR calc Af Amer: 48 mL/min — ABNORMAL LOW (ref >60)
GFR calc non Af Amer: 41 mL/min — ABNORMAL LOW (ref >60)
Glucose, Bld: 106 mg/dL — ABNORMAL HIGH (ref 70–99)
POTASSIUM: 3.3 mmol/L — AB (ref 3.5–5.1)
Sodium: 134 mmol/L — ABNORMAL LOW (ref 135–145)
TOTAL PROTEIN: 6.8 g/dL (ref 6.5–8.1)

## 2017-11-10 LAB — CBC WITH DIFFERENTIAL/PLATELET
BASOS PCT: 1 %
Basophils Absolute: 0.1 10*3/uL (ref 0–0.1)
EOS ABS: 0.2 10*3/uL (ref 0–0.7)
EOS PCT: 2 %
HEMATOCRIT: 37.5 % (ref 35.0–47.0)
Hemoglobin: 13 g/dL (ref 12.0–16.0)
Lymphocytes Relative: 16 %
Lymphs Abs: 1.7 10*3/uL (ref 1.0–3.6)
MCH: 33 pg (ref 26.0–34.0)
MCHC: 34.8 g/dL (ref 32.0–36.0)
MCV: 94.8 fL (ref 80.0–100.0)
MONO ABS: 0.6 10*3/uL (ref 0.2–0.9)
MONOS PCT: 6 %
NEUTROS ABS: 7.9 10*3/uL — AB (ref 1.4–6.5)
Neutrophils Relative %: 75 %
PLATELETS: 395 10*3/uL (ref 150–440)
RBC: 3.95 MIL/uL (ref 3.80–5.20)
RDW: 12 % (ref 11.5–14.5)
WBC: 10.5 10*3/uL (ref 3.6–11.0)

## 2017-11-10 LAB — URINALYSIS, COMPLETE (UACMP) WITH MICROSCOPIC
BILIRUBIN URINE: NEGATIVE
Glucose, UA: NEGATIVE mg/dL
Hgb urine dipstick: NEGATIVE
KETONES UR: 5 mg/dL — AB
NITRITE: NEGATIVE
PH: 7 (ref 5.0–8.0)
PROTEIN: NEGATIVE mg/dL
Specific Gravity, Urine: 1.015 (ref 1.005–1.030)
Squamous Epithelial / LPF: >50 — ABNORMAL HIGH (ref 0–5)
WBC, UA: >50 WBC/hpf — ABNORMAL HIGH (ref 0–5)

## 2017-11-10 LAB — TROPONIN I

## 2017-11-10 LAB — LITHIUM LEVEL: Lithium Lvl: 3.18 mmol/L (ref 0.60–1.20)

## 2017-11-10 MED ORDER — ONDANSETRON HCL 4 MG PO TABS: 4.0000 mg | ORAL_TABLET | Freq: Four times a day (QID) | ORAL | Status: DC | PRN

## 2017-11-10 MED ORDER — ACETAMINOPHEN 650 MG RE SUPP
650.0000 mg | Freq: Four times a day (QID) | RECTAL | Status: DC | PRN
Start: 2017-11-10 — End: 2017-11-14

## 2017-11-10 MED ORDER — HEPARIN SODIUM (PORCINE) 5000 UNIT/ML IJ SOLN
5000.0000 [IU] | Freq: Three times a day (TID) | INTRAMUSCULAR | Status: DC
  Administered 2017-11-10 – 2017-11-13 (×10): 5000 [IU] via SUBCUTANEOUS
  Filled 2017-11-10 (×12): qty 1

## 2017-11-10 MED ORDER — NYSTATIN 100000 UNIT/GM EX POWD
Freq: Four times a day (QID) | CUTANEOUS | Status: DC
  Administered 2017-11-11 – 2017-11-14 (×13): via TOPICAL
  Filled 2017-11-10 (×2): qty 15

## 2017-11-10 MED ORDER — CEFTRIAXONE SODIUM 1 G IJ SOLR
1.0000 g | Freq: Once | INTRAMUSCULAR | Status: AC
  Administered 2017-11-10: 1 g via INTRAVENOUS
  Filled 2017-11-10: qty 10

## 2017-11-10 MED ORDER — QUETIAPINE FUMARATE 25 MG PO TABS
25.0000 mg | ORAL_TABLET | Freq: Every day | ORAL | Status: DC
  Administered 2017-11-11 – 2017-11-13 (×3): 25 mg via ORAL
  Filled 2017-11-10 (×4): qty 1

## 2017-11-10 MED ORDER — ASPIRIN EC 81 MG PO TBEC
81.0000 mg | DELAYED_RELEASE_TABLET | Freq: Every day | ORAL | Status: DC
  Administered 2017-11-11 – 2017-11-14 (×4): 81 mg via ORAL
  Filled 2017-11-10 (×4): qty 1

## 2017-11-10 MED ORDER — SODIUM CHLORIDE 0.9 % IV SOLN
1.0000 g | Freq: Every day | INTRAVENOUS | Status: DC
  Administered 2017-11-11 – 2017-11-12 (×2): 1 g via INTRAVENOUS
  Filled 2017-11-10: qty 10
  Filled 2017-11-10 (×2): qty 1

## 2017-11-10 MED ORDER — SODIUM CHLORIDE 0.9 % IV SOLN
INTRAVENOUS | Status: DC
  Administered 2017-11-10 – 2017-11-13 (×7): via INTRAVENOUS

## 2017-11-10 MED ORDER — ACETAMINOPHEN 325 MG PO TABS: 650.0000 mg | ORAL_TABLET | Freq: Four times a day (QID) | ORAL | Status: DC | PRN

## 2017-11-10 MED ORDER — ALBUTEROL SULFATE (2.5 MG/3ML) 0.083% IN NEBU: 3.0000 mL | INHALATION_SOLUTION | RESPIRATORY_TRACT | Status: DC | PRN

## 2017-11-10 MED ORDER — PANTOPRAZOLE SODIUM 40 MG PO TBEC
40.0000 mg | DELAYED_RELEASE_TABLET | Freq: Every day | ORAL | Status: DC
  Administered 2017-11-11 – 2017-11-14 (×4): 40 mg via ORAL
  Filled 2017-11-10 (×4): qty 1

## 2017-11-10 MED ORDER — SODIUM CHLORIDE 0.9 % IV SOLN
1000.0000 mL | Freq: Once | INTRAVENOUS | Status: AC
  Administered 2017-11-10: 1000 mL via INTRAVENOUS

## 2017-11-10 MED ORDER — FLUTICASONE PROPIONATE 50 MCG/ACT NA SUSP
2.0000 | Freq: Every day | NASAL | Status: DC
  Administered 2017-11-11 – 2017-11-14 (×3): 2 via NASAL
  Filled 2017-11-10 (×2): qty 16

## 2017-11-10 MED ORDER — ONDANSETRON HCL 4 MG/2ML IJ SOLN: 4.0000 mg | Freq: Four times a day (QID) | INTRAMUSCULAR | Status: DC | PRN

## 2017-11-10 NOTE — ED Triage Notes (Signed)
Recent medication changes. Pt reports not feeling like herself for weeks now.

## 2017-11-10 NOTE — ED Notes (Signed)
Transport to floor rm 104.AS

## 2017-11-10 NOTE — ED Notes (Signed)
Pt reports falling yesterday. MD made aware.

## 2017-11-10 NOTE — ED Notes (Signed)
Attempted to give report. Floor unable to accept report at this time.

## 2017-11-10 NOTE — H&P (Signed)
Jaime Freeman at [facility] NAME: Jaime Freeman    MR#:  [id number]  DATE OF BIRTH:  06-12-1955  DATE OF ADMISSION:  11/10/2017  PRIMARY CARE PHYSICIAN: Jaime Roys, DO   REQUESTING/REFERRING PHYSICIAN: Dr. Lenise Arena  CHIEF COMPLAINT:   Chief Complaint  Patient presents with  . Altered Mental Status    HISTORY OF PRESENT ILLNESS:  Jaime Freeman  is a 62 y.o. female with a known history of bipolar disorder, obsessive-compulsive disorder, anxiety, morbid obesity, hypertension who presents to the hospital due to weakness, tremors, difficulty ambulating over the past 2 weeks.  Patient says she developed profound weakness and tremor over the past few weeks which has progressively gotten worse, and went to see her primary care physician but they could not find a reason for it.  Her symptoms were getting progressively worse so her family was concerned and brought her to the ER for further evaluation.  In the emergency room on routine blood work patient was noted to be in acute kidney injury and her lithium level was noted to be significantly high at 3.18.  Patient was noted to have symptoms of lithium toxicity and therefore hospitalist services were contacted for admission.  Patient also complains of some urinary frequency, dysuria over the past few days.  Urinalysis is also positive for urinary tract infection.  PAST MEDICAL HISTORY:   Past Medical History:  Diagnosis Date  . Allergic rhinitis   . Anxiety   . Bipolar affective disorder ([place])   . Depression   . Hiatal hernia   . Hypertension   . IFG (impaired fasting glucose)   . Menopausal state   . Morbid obesity ([place])   . OCD (obsessive compulsive disorder)   . Panic disorder   . Restrictive lung disease   . Vitamin B12 deficiency   . Vitamin D deficiency disease     PAST SURGICAL HISTORY:   Past Surgical History:  Procedure Laterality Date  . CESAREAN SECTION    . DILATION AND  CURETTAGE OF UTERUS    . TONSILLECTOMY      SOCIAL HISTORY:   Social History   Tobacco Use  . Smoking status: Former Smoker    Types: Cigarettes    Last attempt to quit: 04/27/1987    Years since quitting: 30.5  . Smokeless tobacco: Never Used  Substance Use Topics  . Alcohol use: No    FAMILY HISTORY:   Family History  Problem Relation Age of Onset  . Heart disease Mother   . Heart attack Mother   . Hypertension Mother   . Cancer Father        GI tract  . Hypertension Father   . Cancer Maternal Grandmother        gallbladder  . Heart disease Maternal Grandfather   . COPD Neg Hx   . Diabetes Neg Hx   . Stroke Neg Hx     DRUG ALLERGIES:   Allergies  Allergen Reactions  . Codeine Other (See Comments)  . Dynacirc [Isradipine]   . Guaifenesin & Derivatives   . Sudafed [Pseudoephedrine]   . Zithromax [Azithromycin]   . Prednisone Anxiety    Anxiousness    REVIEW OF SYSTEMS:   Review of Systems  Constitutional: Negative for fever and weight loss.  HENT: Negative for congestion, nosebleeds and tinnitus.   Eyes: Negative for blurred vision, double vision and redness.  Respiratory: Negative for cough, hemoptysis and shortness of breath.  Cardiovascular: Negative for chest pain, orthopnea, leg swelling and PND.  Gastrointestinal: Positive for nausea. Negative for abdominal pain, diarrhea, melena and vomiting.  Genitourinary: Negative for dysuria, hematuria and urgency.  Musculoskeletal: Negative for falls and joint pain.  Neurological: Positive for tremors and weakness (Generalized). Negative for dizziness, tingling, sensory change, focal weakness, seizures and headaches.  Endo/Heme/Allergies: Negative for polydipsia. Does not bruise/bleed easily.  Psychiatric/Behavioral: Negative for depression and memory loss. The patient is not nervous/anxious.     MEDICATIONS AT HOME:   Prior to Admission medications   Medication Sig Start Date End Date Taking? Authorizing  Provider  aspirin EC 81 MG tablet Take 81 mg by mouth daily.   Yes [provider]  benazepril (LOTENSIN) 20 MG tablet Take 1 tablet (20 mg total) by mouth daily. 06/07/17  Yes Johnson, Megan P, DO  clonazePAM (KLONOPIN) 1 MG tablet Take 0.5-1 tablets (0.5-1 mg total) by mouth 2 (two) times daily as needed for anxiety. 09/20/17  Yes Johnson, Megan P, DO  etodolac (LODINE) 400 MG tablet Take 1 tablet (400 mg total) by mouth 2 (two) times daily as needed. 08/08/17  Yes Johnson, Megan P, DO  hydrochlorothiazide (MICROZIDE) 12.5 MG capsule Take 1 capsule (12.5 mg total) by mouth daily. 06/07/17  Yes Johnson, Megan P, DO  lithium carbonate 300 MG capsule Take 2 capsules (600 mg total) by mouth 2 (two) times daily. 06/07/17  Yes Johnson, Megan P, DO  omeprazole (PRILOSEC) 20 MG capsule Take 1 capsule (20 mg total) by mouth daily. 06/07/17  Yes Johnson, Megan P, DO  QUEtiapine (SEROQUEL) 25 MG tablet Take 1 tablet (25 mg total) by mouth at bedtime. 11/07/17 12/07/17 Yes Pollak, Wendee Beavers, PA-C  albuterol (PROVENTIL HFA;VENTOLIN HFA) 108 (90 Base) MCG/ACT inhaler Inhale 2 puffs into the lungs every 4 (four) hours as needed for wheezing or shortness of breath. 06/07/17   Johnson, Megan P, DO  bisacodyl (DULCOLAX) 5 MG EC tablet Take 5 mg by mouth daily as needed for moderate constipation.    [provider]  FLUoxetine (PROZAC) 20 MG tablet Take 1.5 tablets (30 mg total) by mouth 2 (two) times daily. Patient not taking: Reported on 11/10/2017 09/20/17   Park Liter P, DO  fluticasone (FLONASE) 50 MCG/ACT nasal spray Place 2 sprays into both nostrils daily. Reported on 05/08/2015 06/07/17   Park Liter P, DO  nystatin (MYCOSTATIN/NYSTOP) powder Apply topically 4 (four) times daily. 09/20/17   Johnson, Megan P, DO      VITAL SIGNS:  Blood pressure (!) 134/56, pulse 65, temperature 98.5 F (36.9 C), temperature source Oral, resp. rate (!) 21, height 5\' 2"  (1.575 m), weight 90.7 kg (200 lb), SpO2 99  %.  PHYSICAL EXAMINATION:  Physical Exam  GENERAL:  62 y.o.-year-old patient lying in the bed in no acute distress.  EYES: Pupils equal, round, reactive to light and accommodation. No scleral icterus. Extraocular muscles intact.  HEENT: Head atraumatic, normocephalic. Oropharynx and nasopharynx clear. No oropharyngeal erythema, moist oral mucosa  NECK:  Supple, no jugular venous distention. No thyroid enlargement, no tenderness.  LUNGS: Normal breath sounds bilaterally, no wheezing, rales, rhonchi. No use of accessory muscles of respiration.  CARDIOVASCULAR: S1, S2 RRR. No murmurs, rubs, gallops, clicks.  ABDOMEN: Soft, nontender, nondistended. Bowel sounds present. No organomegaly or mass.  EXTREMITIES: No pedal edema, cyanosis, or clubbing. + 2 pedal & radial pulses b/l.   NEUROLOGIC: Cranial nerves II through XII are intact. No focal Motor or sensory deficits appreciated b/l.  Globally weak, Tremor PSYCHIATRIC: The patient is alert and oriented x 3.  SKIN: No obvious rash, lesion, or ulcer.   LABORATORY PANEL:   CBC Recent Labs  Lab 11/10/17 1430  WBC 10.5  HGB 13.0  HCT 37.5  PLT 395   ------------------------------------------------------------------------------------------------------------------  Chemistries  Recent Labs  Lab 11/10/17 1430  NA 134*  K 3.3*  CL 102  CO2 22  GLUCOSE 106*  BUN 26*  CREATININE 1.35*  CALCIUM 10.1  AST 22  ALT 19  ALKPHOS 73  BILITOT 0.8   ------------------------------------------------------------------------------------------------------------------  Cardiac Enzymes Recent Labs  Lab 11/10/17 1430  TROPONINI <0.03   ------------------------------------------------------------------------------------------------------------------  RADIOLOGY:  Ct Head Wo Contrast  Result Date: 11/10/2017 CLINICAL DATA:  Altered mental status EXAM: CT HEAD WITHOUT CONTRAST TECHNIQUE: Contiguous axial images were obtained from the base of  the skull through the vertex without intravenous contrast. COMPARISON:  10/18/2017 FINDINGS: Brain: There is no mass, hemorrhage or extra-axial collection. The size and configuration of the ventricles and extra-axial CSF spaces are normal. There is no acute or chronic infarction. The brain parenchyma is normal. Vascular: No abnormal hyperdensity of the major intracranial arteries or dural venous sinuses. No intracranial atherosclerosis. Skull: The visualized skull base, calvarium and extracranial soft tissues are normal. Sinuses/Orbits: No fluid levels or advanced mucosal thickening of the visualized paranasal sinuses. No mastoid or middle ear effusion. The orbits are normal. IMPRESSION: Normal brain. Electronically Signed   By: Ulyses Jarred M.D.   On: 11/10/2017 15:07     IMPRESSION AND PLAN:   62 year old female with past medical history of bipolar disorder, depression, hypertension, anxiety, history of obsessive-compulsive disorder who presents to the hospital due to weakness, tremors and nausea and noted to have lithium toxicity along with acute kidney injury.  1.  Lithium toxicity-this is a cause of patient's weakness tremors and nausea. - The cause of patient's lithium toxicity is not intentional overdose but due to patient's acute kidney injury, underlying UTI and her renal function getting worse. - We will hydrate the patient with IV fluids, follow BUN and creatinine, follow lithium level.  Will consult nephrology.  Discussed the plan with them and if the lithium level was not improving they would consider doing dialysis. -Keep the patient on telemetry.  2.  Acute kidney injury-secondary to dehydration and poor p.o. intake. - Hold patient's benazepril, hydrochlorothiazide.  Hydrate the patient with IV fluids, follow BUN and creatinine. -Await nephrology input.  3.  History of bipolar disorder/anxiety/OCD-patient is on lithium, Seroquel, Klonopin and follows with her primary care physician  and does not have a psychiatrist and does not see a psychiatrist.  Her primary care physician prescribes her these meds.  I will get a psychiatric consult to help [place] with med management.  4.  Essential hypertension- given patient's acute kidney injury, dehydration hold patient's Hydrocort thiazide benazepril for now. -If needed would consider PRN IV hydralazine.  5.  GERD-continue Protonix.     All the records are reviewed and case discussed with ED provider. Management plans discussed with the patient, family and they are in agreement.  CODE STATUS: Full code  TOTAL TIME TAKING CARE OF THIS PATIENT: 45 minutes.    Henreitta Leber M.D on 11/10/2017 at 7:02 PM  Between 7am to 6pm - Pager - [phone]  After 6pm go to [email or web address] - password [email or web address] [facility]  Office  [phone]  CC: Primary care physician; Jaime Roys, DO

## 2017-11-10 NOTE — ED Provider Notes (Signed)
[facility] Emergency Department Provider Note       Time seen: ----------------------------------------- 2:19 PM on 11/10/2017 -----------------------------------------   I have reviewed the triage vital signs and the nursing notes.  HISTORY   Chief Complaint No chief complaint on file.    HPI Jaime Freeman is a 62 y.o. female with a history of bipolar disorder, depression, hypertension, morbid obesity, panic disorder who presents to the ED for general ill feeling.  Patient reports recent medication changes including her having been weaned off of Klonopin.  Patient states she has not felt like herself for weeks now.  She was oriented except to the day of the week by EMS.  She denies any recent illness or other changes in her medicines other than Klonopin.  Patient states she also has had poor p.o. intake but no recent sickness.  Past Medical History:  Diagnosis Date  . Allergic rhinitis   . Anxiety   . Bipolar affective disorder ([place])   . Depression   . Hiatal hernia   . Hypertension   . IFG (impaired fasting glucose)   . Menopausal state   . Morbid obesity ([place])   . OCD (obsessive compulsive disorder)   . Panic disorder   . Restrictive lung disease   . Vitamin B12 deficiency   . Vitamin D deficiency disease     Patient Active Problem List   Diagnosis Date Noted  . Facial droop 10/17/2017  . Complicated grief 50/53/9767  . CKD (chronic kidney disease) stage 1, GFR 90 ml/min or greater 06/07/2017  . Panic attacks 08/13/2015  . Insomnia 08/13/2015  . Peripheral neuropathy 05/08/2015  . Osteoarthritis of both knees 12/25/2014  . OCD (obsessive compulsive disorder) 12/25/2014  . Encounter for lithium monitoring 12/25/2014  . Dyslipidemia 12/25/2014  . Kidney lesion 12/25/2014  . Fatty liver 12/25/2014  . Morbid obesity ([place])   . Menopausal state   . Benign hypertensive renal disease   . IFG (impaired fasting glucose)   . Vitamin D  deficiency disease   . Vitamin B12 deficiency   . Depression   . Anxiety   . Restrictive lung disease   . Hypertension, benign 03/17/2012    Past Surgical History:  Procedure Laterality Date  . CESAREAN SECTION    . DILATION AND CURETTAGE OF UTERUS    . TONSILLECTOMY      Allergies Codeine; Dynacirc [isradipine]; Guaifenesin & derivatives; Sudafed [pseudoephedrine]; Zithromax [azithromycin]; and Prednisone  Social History Social History   Tobacco Use  . Smoking status: Former Smoker    Types: Cigarettes    Last attempt to quit: 04/27/1987    Years since quitting: 30.5  . Smokeless tobacco: Never Used  Substance Use Topics  . Alcohol use: No  . Drug use: No   Review of Systems Constitutional: Negative for fever. Cardiovascular: Negative for chest pain. Respiratory: Negative for shortness of breath. Gastrointestinal: Negative for abdominal pain, vomiting and diarrhea. Musculoskeletal: Negative for back pain. Skin: Negative for rash. Neurological: Negative for headaches, positive for weakness  All systems negative/normal/unremarkable except as stated in the HPI  ____________________________________________   PHYSICAL EXAM:  VITAL SIGNS: ED Triage Vitals  Enc Vitals Group     BP      Pulse      Resp      Temp      Temp src      SpO2      Weight      Height      Head  Circumference      Peak Flow      Pain Score      Pain Loc      Pain Edu?      Excl. in [place]?    Constitutional: Alert and oriented.  Obese, no acute distress Eyes: Conjunctivae are normal. Normal extraocular movements. ENT   Head: Normocephalic and atraumatic.   Nose: No congestion/rhinnorhea.   Mouth/Throat: Mucous membranes are moist.   Neck: No stridor. Cardiovascular: Normal rate, regular rhythm. No murmurs, rubs, or gallops. Respiratory: Normal respiratory effort without tachypnea nor retractions. Breath sounds are clear and equal bilaterally. No  wheezes/rales/rhonchi. Gastrointestinal: Soft and nontender. Normal bowel sounds Musculoskeletal: Nontender with normal range of motion in extremities. No lower extremity tenderness nor edema. Neurologic:  Normal speech and language. No gross focal neurologic deficits are appreciated.  Tremor is noted Skin:  Skin is warm, dry and intact. No rash noted. Psychiatric: Mood and affect are normal. Speech and behavior are normal.  ____________________________________________  EKG: Interpreted by me.  Sinus rhythm rate 72 bpm, normal QRS, long QT, normal axis  ____________________________________________  ED COURSE:  As part of my medical decision making, I reviewed the following data within the [place] History obtained from family if available, nursing notes, old chart and ekg, as well as notes from prior ED visits. Patient presented for weakness, we will assess with labs and imaging as indicated at this time. Clinical Course as of Nov 10 1833  Thu Nov 10, 2017  1800 Lithium(!!): 3.18 [JW]    Clinical Course User Index [JW] Earleen Newport, MD   Procedures ____________________________________________   LABS (pertinent positives/negatives)  Labs Reviewed  CBC WITH DIFFERENTIAL/PLATELET - Abnormal; Notable for the following components:      Result Value   Neutro Abs 7.9 (*)    All other components within normal limits  COMPREHENSIVE METABOLIC PANEL - Abnormal; Notable for the following components:   Sodium 134 (*)    Potassium 3.3 (*)    Glucose, Bld 106 (*)    BUN 26 (*)    Creatinine, Ser 1.35 (*)    GFR calc non Af Amer 41 (*)    GFR calc Af Amer 48 (*)    All other components within normal limits  URINALYSIS, COMPLETE (UACMP) WITH MICROSCOPIC - Abnormal; Notable for the following components:   Color, Urine YELLOW (*)    APPearance CLOUDY (*)    Ketones, ur 5 (*)    Leukocytes, UA LARGE (*)    WBC, UA >50 (*)    Bacteria, UA RARE (*)    Squamous  Epithelial / LPF >50 (*)    Non Squamous Epithelial 0-5 (*)    All other components within normal limits  LITHIUM LEVEL - Abnormal; Notable for the following components:   Lithium Lvl 3.18 (*)    All other components within normal limits  URINE CULTURE  TROPONIN I   CRITICAL CARE Performed by: Laurence Aly   Total critical care time: 30 minutes  Critical care time was exclusive of separately billable procedures and treating other patients.  Critical care was necessary to treat or prevent imminent or life-threatening deterioration.  Critical care was time spent personally by me on the following activities: development of treatment plan with patient and/or surrogate as well as nursing, discussions with consultants, evaluation of patient's response to treatment, examination of patient, obtaining history from patient or surrogate, ordering and performing treatments and interventions, ordering and review of  laboratory studies, ordering and review of radiographic studies, pulse oximetry and re-evaluation of patient's condition.   RADIOLOGY Images were viewed by me  IMPRESSION: Normal brain.   ____________________________________________  DIFFERENTIAL DIAGNOSIS   Dehydration, electrolyte abnormality, medication side effect, withdrawal, seizure  FINAL ASSESSMENT AND PLAN  Weakness, lithium toxicity, UTI   Plan: The patient had presented for generalized weakness. Patient's labs did indicate UTI as well as elevated lithium levels concerning for lithium toxicity.  This is likely secondary to a chronic congestion with decreased creatinine level. Patient's imaging did not reveal any acute process.  She was started on fluids as well as IV Rocephin, we sent a urine culture.  I will discuss with the hospitalist for admission.   Laurence Aly, MD   Note: This note was generated in part or whole with voice recognition software. Voice recognition is usually quite accurate  but there are transcription errors that can and very often do occur. I apologize for any typographical errors that were not detected and corrected.     Earleen Newport, MD 11/10/17 [phone]

## 2017-11-10 NOTE — ED Notes (Signed)
Report given to Jackie, RN.

## 2017-11-10 NOTE — ED Notes (Signed)
Attempted to give report. Unable to give report at this time.

## 2017-11-10 NOTE — ED Notes (Signed)
Date and time results received: 11/10/17 1800   Test: Lithium  Critical Value: 3.18  Name of Provider Notified: Jimmye Norman  Orders Received? Or Actions Taken?: no further orders given at this time, will continue to monitor

## 2017-11-11 DIAGNOSIS — R251 Tremor, unspecified: Secondary | ICD-10-CM | POA: Diagnosis not present

## 2017-11-11 DIAGNOSIS — F4321 Adjustment disorder with depressed mood: Secondary | ICD-10-CM

## 2017-11-11 DIAGNOSIS — T56891A Toxic effect of other metals, accidental (unintentional), initial encounter: Principal | ICD-10-CM

## 2017-11-11 LAB — CBC
HCT: 33.2 % — ABNORMAL LOW (ref 35.0–47.0)
HEMOGLOBIN: 11.5 g/dL — AB (ref 12.0–16.0)
MCH: 33.4 pg (ref 26.0–34.0)
MCHC: 34.6 g/dL (ref 32.0–36.0)
MCV: 96.5 fL (ref 80.0–100.0)
Platelets: 358 10*3/uL (ref 150–440)
RBC: 3.44 MIL/uL — ABNORMAL LOW (ref 3.80–5.20)
RDW: 12.2 % (ref 11.5–14.5)
WBC: 9.3 10*3/uL (ref 3.6–11.0)

## 2017-11-11 LAB — BASIC METABOLIC PANEL
Anion gap: 5 (ref 5–15)
BUN: 20 mg/dL (ref 8–23)
CALCIUM: 9.3 mg/dL (ref 8.9–10.3)
CO2: 25 mmol/L (ref 22–32)
CREATININE: 1.01 mg/dL — AB (ref 0.44–1.00)
Chloride: 108 mmol/L (ref 98–111)
GFR calc non Af Amer: 59 mL/min — ABNORMAL LOW (ref >60)
Glucose, Bld: 84 mg/dL (ref 70–99)
POTASSIUM: 3.4 mmol/L — AB (ref 3.5–5.1)
SODIUM: 138 mmol/L (ref 135–145)

## 2017-11-11 LAB — LITHIUM LEVEL: Lithium Lvl: 2.74 mmol/L (ref 0.60–1.20)

## 2017-11-11 MED ORDER — POLYETHYLENE GLYCOL 3350 17 G PO PACK
17.0000 g | PACK | Freq: Every day | ORAL | Status: DC
  Administered 2017-11-11 – 2017-11-14 (×4): 17 g via ORAL
  Filled 2017-11-11 (×4): qty 1

## 2017-11-11 MED ORDER — POLYVINYL ALCOHOL 1.4 % OP SOLN
1.0000 [drp] | OPHTHALMIC | Status: DC | PRN
  Administered 2017-11-11: 18:00:00 1 [drp] via OPHTHALMIC
  Filled 2017-11-11: qty 15

## 2017-11-11 MED ORDER — BISACODYL 5 MG PO TBEC
5.0000 mg | DELAYED_RELEASE_TABLET | Freq: Every day | ORAL | Status: DC | PRN
  Administered 2017-11-14: 16:00:00 5 mg via ORAL
  Filled 2017-11-11: qty 1

## 2017-11-11 MED ORDER — SENNOSIDES-DOCUSATE SODIUM 8.6-50 MG PO TABS
1.0000 | ORAL_TABLET | Freq: Two times a day (BID) | ORAL | Status: DC
  Administered 2017-11-11 – 2017-11-14 (×7): 1 via ORAL
  Filled 2017-11-11 (×7): qty 1

## 2017-11-11 NOTE — Consult Note (Signed)
[facility] Consult   Reason for Consult: Consult for this 62 year old woman with a history of mental health problems admitted to the hospital with what turns out to be lithium toxicity Referring Physician: Pyreddy Patient Identification: Jaime Freeman MRN:  [id number] Principal Diagnosis: Lithium toxicity Diagnosis:   Patient Active Problem List   Diagnosis Date Noted  . Grief [F43.21] 11/11/2017  . Lithium toxicity [T56.891A] 11/10/2017  . Facial droop [R29.810] 10/17/2017  . Complicated grief [W26.37, Z63.4] 09/20/2017  . CKD (chronic kidney disease) stage 1, GFR 90 ml/min or greater [N18.1] 06/07/2017  . Panic attacks [F41.0] 08/13/2015  . Insomnia [G47.00] 08/13/2015  . Peripheral neuropathy [G62.9] 05/08/2015  . Osteoarthritis of both knees [M17.0] 12/25/2014  . OCD (obsessive compulsive disorder) [F42.9] 12/25/2014  . Encounter for lithium monitoring [Z51.81, Z79.899] 12/25/2014  . Dyslipidemia [E78.5] 12/25/2014  . Kidney lesion [N28.9] 12/25/2014  . Fatty liver [K76.0] 12/25/2014  . Morbid obesity ([place]) [E66.01]   . Menopausal state [N95.1]   . 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 IFG (impaired fasting glucose) [R73.01]   . Vitamin D deficiency disease [E55.9]   . Vitamin B12 deficiency [E53.8]   . Depression [F32.9]   . Anxiety [F41.9]   . Restrictive lung disease [J98.4]   . Hypertension, benign [I10] 03/17/2012    Total Time spent with patient: 1 hour  Subjective:   Jaime Freeman is a 62 y.o. female patient admitted with "oh I have been feeling very bad".  HPI: Patient interviewed chart reviewed.  Patient was very pleasant and cooperative but she is confused and not the best historian of her condition.  Patient indicates that her husband died about 5 months ago.  Since then she has been of course very sad and down and depressed.  She cannot give me a very good detailed picture of it but looking in the chart it is clear that she had been  presenting to her primary care doctor with complaint of depression and weight loss for a while.  Today the patient reports that her mood is not necessarily so bad.  Definitely denies any suicidal ideation.  Patient says she has been taking her regular medicine as prescribed recently but she has been gradually developing complaints of physical symptoms including nausea fatigue and some mental confusion.  She has had several workups of these without a clear finding but it looks like no lithium level had been checked since last February.  Patient presented to our emergency room and lithium level was checked and is above 3 as of yesterday.  Patient denies having overdosed or taking in the extra medicine.  Social history: Patient's husband passed away 5 months ago and she says ever since she has been staying with her son and his family.  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  Medical history: Overweight, chronic pain, acute lithium toxicity Substance abuse history: Patient denies any past substance abuse problems or any current substance use  Past Psychiatric History: Patient tells me she has been taking lithium for about 10-12 years.  The records we have on hand do not go back that far.  Because she is still a little confused it is particularly hard to get a clear picture of why she has been taking it.  She denies that she was ever admitted to a psychiatric hospital.  Denies any history of suicide attempts.  Denies any memory of any  history of psychotic symptoms.  She carries diagnoses of depression and OCD.  Lithium levels measured in the past prior to February look like they mostly stayed under 1 and a pretty normal therapeutic range.  She has no history of suicide attempts overdoses or self injury.  Risk to Self:   Risk to Others:   Prior Inpatient Therapy:   Prior Outpatient Therapy:    Past Medical History:  Past Medical  History:  Diagnosis Date  . Allergic rhinitis   . Anxiety   . Bipolar affective disorder ([place])   . Depression   . Hiatal hernia   . Hypertension   . IFG (impaired fasting glucose)   . Menopausal state   . Morbid obesity ([place])   . OCD (obsessive compulsive disorder)   . Panic disorder   . Restrictive lung disease   . Vitamin B12 deficiency   . Vitamin D deficiency disease     Past Surgical History:  Procedure Laterality Date  . CESAREAN SECTION    . DILATION AND CURETTAGE OF UTERUS    . TONSILLECTOMY     Family History:  Family History  Problem Relation Age of Onset  . Heart disease Mother   . Heart attack Mother   . Hypertension Mother   . Cancer Father        GI tract  . Hypertension Father   . Cancer Maternal Grandmother        gallbladder  . Heart disease Maternal Grandfather   . COPD Neg Hx   . Diabetes Neg Hx   . Stroke Neg Hx    Family Psychiatric  History: She is not aware of anything but normal levels of anxiety Social History:  Social History   Substance and Sexual Activity  Alcohol Use No     Social History   Substance and Sexual Activity  Drug Use No    Social History   Socioeconomic History  . Marital status: Widowed    Spouse name: Not on file  . Number of children: 2  . Years of education: Not on file  . Highest education level: Not on file  Occupational History  . Not on file  Social Needs  . Financial resource strain: Not on file  . Food insecurity:    Worry: Sometimes true    Inability: Never true  . Transportation needs:    Medical: Patient refused    Non-medical: Patient refused  Tobacco Use  . Smoking status: Former Smoker    Types: Cigarettes    Last attempt to quit: 04/27/1987    Years since quitting: 30.5  . Smokeless tobacco: Never Used  Substance and Sexual Activity  . Alcohol use: No  . Drug use: No  . Sexual activity: Not Currently  Lifestyle  . Physical activity:    Days per week: Patient refused    Minutes  per session: Patient refused  . Stress: To some extent  Relationships  . Social connections:    Talks on phone: Patient refused    Gets together: Patient refused    Attends religious service: Patient refused    Active member of club or organization: Patient refused    Attends meetings of clubs or organizations: Patient refused    Relationship status: Patient refused  Other Topics Concern  . Not on file  Social History Narrative  . Not on file   Additional Social History:    Allergies:   Allergies  Allergen Reactions  . Codeine Other (See Comments)  .  Dynacirc [Isradipine]   . Guaifenesin & Derivatives   . Sudafed [Pseudoephedrine]   . Zithromax [Azithromycin]   . Prednisone Anxiety    Anxiousness    Labs:  Results for orders placed or performed during the hospital encounter of 11/10/17 (from the past 48 hour(s))  CBC with Differential     Status: Abnormal   Collection Time: 11/10/17  2:30 PM  Result Value Ref Range   WBC 10.5 3.6 - 11.0 K/uL   RBC 3.95 3.80 - 5.20 MIL/uL   Hemoglobin 13.0 12.0 - 16.0 g/dL   HCT 37.5 35.0 - 47.0 %   MCV 94.8 80.0 - 100.0 fL   MCH 33.0 26.0 - 34.0 pg   MCHC 34.8 32.0 - 36.0 g/dL   RDW 12.0 11.5 - 14.5 %   Platelets 395 150 - 440 K/uL   Neutrophils Relative % 75 %   Neutro Abs 7.9 (H) 1.4 - 6.5 K/uL   Lymphocytes Relative 16 %   Lymphs Abs 1.7 1.0 - 3.6 K/uL   Monocytes Relative 6 %   Monocytes Absolute 0.6 0.2 - 0.9 K/uL   Eosinophils Relative 2 %   Eosinophils Absolute 0.2 0 - 0.7 K/uL   Basophils Relative 1 %   Basophils Absolute 0.1 0 - 0.1 K/uL    Comment: Performed at [facility], [address] [place], [place] [address]  Comprehensive metabolic panel     Status: Abnormal   Collection Time: 11/10/17  2:30 PM  Result Value Ref Range   Sodium 134 (L) 135 - 145 mmol/L   Potassium 3.3 (L) 3.5 - 5.1 mmol/L   Chloride 102 98 - 111 mmol/L    Comment: Please note change in reference range.   CO2 22 22 - 32 mmol/L    Glucose, Bld 106 (H) 70 - 99 mg/dL    Comment: Please note change in reference range.   BUN 26 (H) 8 - 23 mg/dL    Comment: Please note change in reference range.   Creatinine, Ser 1.35 (H) 0.44 - 1.00 mg/dL   Calcium 10.1 8.9 - 10.3 mg/dL   Total Protein 6.8 6.5 - 8.1 g/dL   Albumin 4.0 3.5 - 5.0 g/dL   AST 22 15 - 41 U/L   ALT 19 0 - 44 U/L    Comment: Please note change in reference range.   Alkaline Phosphatase 73 38 - 126 U/L   Total Bilirubin 0.8 0.3 - 1.2 mg/dL   GFR calc non Af Amer 41 (L) >60 mL/min   GFR calc Af Amer 48 (L) >60 mL/min    Comment: (NOTE) The eGFR has been calculated using the CKD EPI equation. This calculation has not been validated in all clinical situations. eGFR's persistently <60 mL/min signify possible Chronic Kidney Disease.    Anion gap 10 5 - 15    Comment: Performed at [facility], [address] [place], [place] [address]  Troponin I     Status: None   Collection Time: 11/10/17  2:30 PM  Result Value Ref Range   Troponin I <0.03 <0.03 ng/mL    Comment: Performed at [facility], [address] [place],  [address]  Urinalysis, Complete w Microscopic     Status: Abnormal   Collection Time: 11/10/17  2:30 PM  Result Value Ref Range   Color, Urine YELLOW (A) YELLOW   APPearance CLOUDY (A) CLEAR   Specific Gravity, Urine 1.015 1.005 - 1.030   pH 7.0 5.0 -  8.0   Glucose, UA NEGATIVE NEGATIVE mg/dL   Hgb urine dipstick NEGATIVE NEGATIVE   Bilirubin Urine NEGATIVE NEGATIVE   Ketones, ur 5 (A) NEGATIVE mg/dL   Protein, ur NEGATIVE NEGATIVE mg/dL   Nitrite NEGATIVE NEGATIVE   Leukocytes, UA LARGE (A) NEGATIVE   WBC, UA >50 (H) 0 - 5 WBC/hpf   Bacteria, UA RARE (A) NONE SEEN   Squamous Epithelial / LPF >50 (H) 0 - 5   Mucus PRESENT    Non Squamous Epithelial 0-5 (A) NONE SEEN    Comment: Performed at [facility], [address] [place], [place] [address]  Lithium level     Status: Abnormal    Collection Time: 11/10/17  4:57 PM  Result Value Ref Range   Lithium Lvl 3.18 (HH) 0.60 - 1.20 mmol/L    Comment: CRITICAL RESULT CALLED TO, READ BACK BY AND VERIFIED WITH KAYLA CUMMING 11/10/17 1800 KLW Performed at [facility], [address] [place], [place] [address]   Basic metabolic panel     Status: Abnormal   Collection Time: 11/11/17  3:20 AM  Result Value Ref Range   Sodium 138 135 - 145 mmol/L   Potassium 3.4 (L) 3.5 - 5.1 mmol/L   Chloride 108 98 - 111 mmol/L    Comment: Please note change in reference range.   CO2 25 22 - 32 mmol/L   Glucose, Bld 84 70 - 99 mg/dL    Comment: Please note change in reference range.   BUN 20 8 - 23 mg/dL    Comment: Please note change in reference range.   Creatinine, Ser 1.01 (H) 0.44 - 1.00 mg/dL   Calcium 9.3 8.9 - 10.3 mg/dL   GFR calc non Af Amer 59 (L) >60 mL/min   GFR calc Af Amer >60 >60 mL/min    Comment: (NOTE) The eGFR has been calculated using the CKD EPI equation. This calculation has not been validated in all clinical situations. eGFR's persistently <60 mL/min signify possible Chronic Kidney Disease.    Anion gap 5 5 - 15    Comment: Performed at [facility], [address] [place], [place] [address]  CBC     Status: Abnormal   Collection Time: 11/11/17  3:20 AM  Result Value Ref Range   WBC 9.3 3.6 - 11.0 K/uL   RBC 3.44 (L) 3.80 - 5.20 MIL/uL   Hemoglobin 11.5 (L) 12.0 - 16.0 g/dL   HCT 33.2 (L) 35.0 - 47.0 %   MCV 96.5 80.0 - 100.0 fL   MCH 33.4 26.0 - 34.0 pg   MCHC 34.6 32.0 - 36.0 g/dL   RDW 12.2 11.5 - 14.5 %   Platelets 358 150 - 440 K/uL    Comment: Performed at [facility], [address] [place], [place] [address]  Lithium level     Status: Abnormal   Collection Time: 11/11/17  3:20 AM  Result Value Ref Range   Lithium Lvl 2.74 (HH) 0.60 - 1.20 mmol/L    Comment: CRITICAL RESULT CALLED TO, READ BACK BY AND VERIFIED WITH JACKIE PAGE _0  11/11/17 [facility] Performed at  [facility], [address], [place], [place] [address]     Current Facility-Administered Medications  Medication Dose Route Frequency Provider Last Rate Last Dose  . 0.9 %  sodium chloride infusion   Intravenous Continuous Henreitta Leber, MD 125 mL/hr at 11/11/17 0526    . acetaminophen (TYLENOL) tablet 650 mg  650 mg Oral Q6H  PRN Henreitta Leber, MD       Or  . acetaminophen (TYLENOL) suppository 650 mg  650 mg Rectal Q6H PRN Sainani, Belia Heman, MD      . albuterol (PROVENTIL) (2.5 MG/3ML) 0.083% nebulizer solution 3 mL  3 mL Inhalation Q4H PRN Henreitta Leber, MD      . aspirin EC tablet 81 mg  81 mg Oral Daily Henreitta Leber, MD   81 mg at 11/11/17 0910  . bisacodyl (DULCOLAX) EC tablet 5 mg  5 mg Oral Daily PRN Pyreddy, Pavan, MD      . cefTRIAXone (ROCEPHIN) 1 g in sodium chloride 0.9 % 100 mL IVPB  1 g Intravenous q1800 Sainani, Vivek J, MD      . fluticasone (FLONASE) 50 MCG/ACT nasal spray 2 spray  2 spray Each Nare Daily Henreitta Leber, MD   2 spray at 11/11/17 0912  . heparin injection 5,000 Units  5,000 Units Subcutaneous Q8H Henreitta Leber, MD   5,000 Units at 11/11/17 0526  . nystatin (MYCOSTATIN/NYSTOP) topical powder   Topical QID Henreitta Leber, MD      . ondansetron (ZOFRAN) tablet 4 mg  4 mg Oral Q6H PRN Henreitta Leber, MD       Or  . ondansetron (ZOFRAN) injection 4 mg  4 mg Intravenous Q6H PRN Henreitta Leber, MD      . pantoprazole (PROTONIX) EC tablet 40 mg  40 mg Oral Daily Henreitta Leber, MD   40 mg at 11/11/17 0910  . polyethylene glycol (MIRALAX / GLYCOLAX) packet 17 g  17 g Oral Daily Pyreddy, Pavan, MD      . QUEtiapine (SEROQUEL) tablet 25 mg  25 mg Oral QHS Sainani, Belia Heman, MD      . senna-docusate (Senokot-S) tablet 1 tablet  1 tablet Oral BID Saundra Shelling, MD        Musculoskeletal: Strength & Muscle Tone: decreased Gait & Station: unsteady Patient leans: N/A  Psychiatric Specialty Exam: Physical Exam  Vitals  reviewed. Constitutional: She appears well-developed and well-nourished.  HENT:  Head: Normocephalic and atraumatic.  Eyes: Pupils are equal, round, and reactive to light. Conjunctivae are normal.  Neck: Normal range of motion.  Cardiovascular: Normal heart sounds.  Respiratory: Effort normal.  GI: Soft.  Musculoskeletal: Normal range of motion.  Neurological: She is alert.  Skin: Skin is warm and dry.  Psychiatric: Judgment normal. Her mood appears anxious. Her speech is delayed and tangential. She is slowed. Thought content is not paranoid. Cognition and memory are impaired. She expresses no homicidal and no suicidal ideation. She exhibits abnormal recent memory and abnormal remote memory.    Review of Systems  Constitutional: Negative.   HENT: Negative.   Eyes: Negative.   Respiratory: Negative.   Cardiovascular: Negative.   Gastrointestinal: Positive for nausea.  Musculoskeletal: Negative.   Skin: Negative.   Neurological: Positive for weakness.  Psychiatric/Behavioral: Positive for memory loss. Negative for depression, hallucinations, substance abuse and suicidal ideas. The patient is not nervous/anxious and does not have insomnia.     Blood pressure (!) 127/53, pulse 63, temperature 98.5 F (36.9 C), temperature source Oral, resp. rate 18, height _0  (1.575 m), weight 94.3 kg (207 lb 12.8 oz), SpO2 100 %.Body mass index is 38.01 kg/m.  General Appearance: Fairly Groomed  Eye Contact:  Fair  Speech:  Slow  Volume:  Decreased  Mood:  Anxious and Euthymic  Affect:  Congruent  Thought Process:  Disorganized  Orientation:  Full (Time, Place, and Person)  Thought Content:  Logical  Suicidal Thoughts:  No  Homicidal Thoughts:  No  Memory:  Immediate;   Fair Recent;   Poor Remote;   Poor  Judgement:  Fair  Insight:  Fair  Psychomotor Activity:  Decreased  Concentration:  Concentration: Poor  Recall:  Poor  Fund of Knowledge:  Good  Language:  Good  Akathisia:  No   Handed:  Right  AIMS (if indicated):     Assets:  Communication Skills Desire for Improvement Housing Physical Health Resilience Social Support  ADL's:  Intact  Cognition:  Impaired,  Mild  Sleep:        Treatment Plan Summary: Plan This is a 62 year old woman with a history of chronic psychiatric illness who presented to the hospital with a constellation of symptoms all consistent with lithium toxicity.  Lithium level found to be 3.  On recheck today it has gone below 3 but is still significantly above 2.  Patient's mood is mildly dysphoric and anxious but there is no sign of suicidality or psychosis.  She is having pretty noticeable memory and cognitive problems which are probably all attributable to the amount of lithium load she still has.  I inquired about possible causes of elevated lithium.  Patient is not aware of any medication dose changes.  As mentioned she denies having taken any extra medicine intentionally.  One contributing factor may be that as her appetite went down and she got more and more dehydrated and with chronic dehydration may have had less kidney function thus increasing the lithium even more.  A couple other things are that I noticed that she is on Lodine and hydrochlorothiazide.  Both of those medicines can vary significantly, sometimes dangerously, increase the lithium level.  I am not certain how long she has been taking those.  I suspect that the patient given her symptom history has been walking around with a gradually increasing lithium level for quite a while.  That would suggest that she probably has a pretty high lithium load built up in her tissues.  It is probably going to take a while for that to wash out.  I am glad that nephrology has been consulted as they should be asked if dialysis would be appropriate in this situation or not.  Meanwhile I explained the situation to the patient and that it is going to take a while before she is feeling back to normal.   Definitely stay off the lithium for now.  She can continue her other antidepressant that she was taking.  I will follow-up as needed.  It is unclear to me whether she actually needs the lithium long-term.  We may be able to get more history from the family to help determine that.  Disposition: No evidence of imminent risk to self or others at present.   Patient does not meet criteria for psychiatric inpatient admission. Supportive therapy provided about ongoing stressors. Discussed crisis plan, support from social network, calling 911, coming to the Emergency Department, and calling Suicide Hotline.  Alethia Berthold, MD 11/11/2017 12:46 PM

## 2017-11-11 NOTE — Progress Notes (Signed)
[facility] [place] NAME: Jaime Freeman    MR#:  [id number]  DATE OF BIRTH:  Aug 29, 1955  SUBJECTIVE:  CHIEF COMPLAINT:   Chief Complaint  Patient presents with  . Altered Mental Status  Patient seen today No nausea and vomitings No abdominal pain Has increased tremors in the extremities since 2 months  REVIEW OF SYSTEMS:    ROS  CONSTITUTIONAL: No documented fever. No fatigue, weakness. No weight gain, no weight loss.  EYES: No blurry or double vision.  ENT: No tinnitus. No postnasal drip. No redness of the oropharynx.  RESPIRATORY: No cough, no wheeze, no hemoptysis. No dyspnea.  CARDIOVASCULAR: No chest pain. No orthopnea. No palpitations. No syncope.  GASTROINTESTINAL: No nausea, no vomiting or diarrhea. No abdominal pain. No melena or hematochezia.  GENITOURINARY: No dysuria or hematuria.  ENDOCRINE: No polyuria or nocturia. No heat or cold intolerance.  HEMATOLOGY: No anemia. No bruising. No bleeding.  INTEGUMENTARY: No rashes. No lesions.  MUSCULOSKELETAL: No arthritis. No swelling. No gout.  Has tremors NEUROLOGIC: No numbness, tingling, or ataxia. No seizure-type activity.  PSYCHIATRIC: No anxiety. No insomnia. No ADD.   DRUG ALLERGIES:   Allergies  Allergen Reactions  . Codeine Other (See Comments)  . Dynacirc [Isradipine]   . Guaifenesin & Derivatives   . Sudafed [Pseudoephedrine]   . Zithromax [Azithromycin]   . Prednisone Anxiety    Anxiousness    VITALS:  Blood pressure (!) 127/53, pulse 63, temperature 98.5 F (36.9 C), temperature source Oral, resp. rate 18, height 5\' 2"  (1.575 m), weight 94.3 kg (207 lb 12.8 oz), SpO2 100 %.  PHYSICAL EXAMINATION:   Physical Exam  GENERAL:  62 y.o.-year-old patient lying in the bed with no acute distress.  EYES: Pupils equal, round, reactive to light and accommodation. No scleral icterus. Extraocular muscles intact.  HEENT: Head atraumatic, normocephalic. Oropharynx  and nasopharynx clear.  NECK:  Supple, no jugular venous distention. No thyroid enlargement, no tenderness.  LUNGS: Normal breath sounds bilaterally, no wheezing, rales, rhonchi. No use of accessory muscles of respiration.  CARDIOVASCULAR: S1, S2 normal. No murmurs, rubs, or gallops.  ABDOMEN: Soft, nontender, nondistended. Bowel sounds present. No organomegaly or mass.  EXTREMITIES: No cyanosis, clubbing or edema b/l.    Tremors NEUROLOGIC: Cranial nerves II through XII are intact. No focal Motor or sensory deficits b/l.   PSYCHIATRIC: The patient is alert and oriented x 3.  SKIN: No obvious rash, lesion, or ulcer.   LABORATORY PANEL:   CBC Recent Labs  Lab 11/11/17 0320  WBC 9.3  HGB 11.5*  HCT 33.2*  PLT 358   ------------------------------------------------------------------------------------------------------------------ Chemistries  Recent Labs  Lab 11/10/17 1430 11/11/17 0320  NA 134* 138  K 3.3* 3.4*  CL 102 108  CO2 22 25  GLUCOSE 106* 84  BUN 26* 20  CREATININE 1.35* 1.01*  CALCIUM 10.1 9.3  AST 22  --   ALT 19  --   ALKPHOS 73  --   BILITOT 0.8  --    ------------------------------------------------------------------------------------------------------------------  Cardiac Enzymes Recent Labs  Lab 11/10/17 1430  TROPONINI <0.03   ------------------------------------------------------------------------------------------------------------------  RADIOLOGY:  Ct Head Wo Contrast  Result Date: 11/10/2017 CLINICAL DATA:  Altered mental status EXAM: CT HEAD WITHOUT CONTRAST TECHNIQUE: Contiguous axial images were obtained from the base of the skull through the vertex without intravenous contrast. COMPARISON:  10/18/2017 FINDINGS: Brain: There is no mass, hemorrhage or extra-axial collection. The size and configuration of the ventricles and  extra-axial CSF spaces are normal. There is no acute or chronic infarction. The brain parenchyma is normal. Vascular: No  abnormal hyperdensity of the major intracranial arteries or dural venous sinuses. No intracranial atherosclerosis. Skull: The visualized skull base, calvarium and extracranial soft tissues are normal. Sinuses/Orbits: No fluid levels or advanced mucosal thickening of the visualized paranasal sinuses. No mastoid or middle ear effusion. The orbits are normal. IMPRESSION: Normal brain. Electronically Signed   By: Ulyses Jarred M.D.   On: 11/10/2017 15:07     ASSESSMENT AND PLAN:   62 yr old female patient with history of bipolar disorder, pbsessive compulsive disorder, anxiety disorder under service for lithium toxicity and dehydration  - Lithium toxicity improving Lithium on hold IV fluids F/u levels  - Acute Kidney injury Resolving with IV fluids Hold Hctz and benzapril  -Tremors Neuro consult Probbaly from anxiety  - H/O Bipolar disorder/OCD/Anxiety disorder Psych consult Continue seroquel and Klonopin  All the records are reviewed and case discussed with Care Management/Social Worker. Management plans discussed with the patient, family and they are in agreement.  CODE STATUS: Full code  DVT Prophylaxis: SCDs  TOTAL TIME TAKING CARE OF THIS PATIENT: 35 minutes.   POSSIBLE D/C IN 1 to 2 DAYS, DEPENDING ON CLINICAL CONDITION.  Saundra Shelling M.D on 11/11/2017 at 10:46 AM  Between 7am to 6pm - Pager - [phone]  After 6pm go to [email or web address] - password [email or web address] [facility]  Office  [phone]  CC: Primary care physician; Valerie Roys, DO  Note: This dictation was prepared with Dragon dictation along with smaller phrase technology. Any transcriptional errors that result from this process are unintentional.

## 2017-11-11 NOTE — Progress Notes (Signed)
Advanced care plan. Purpose of the Encounter: CODE STATUS Parties in Attendance:Patient and Patients son Patient's Decision Capacity:Good Subjective/Patient's story: Presented for lithium toxicity and dehydration Objective/Medical story Has renal failure and lithium toxicity Goals of care determination:  Advance care directives and goals of care discussed Patient and family want cpr, intubation, ventilator if need arises CODE STATUS: Full code Time spent discussing advanced care planning: 16 minutes

## 2017-11-11 NOTE — Consult Note (Signed)
[facility]  CONSULT NOTE    Date: 11/11/2017                  Patient Name:  Jaime Freeman  MRN: [id number]  DOB: [birth year]  Age / Sex: 62 y.o., female         PCP: Valerie Roys, DO                 Service Requesting Consult: Dr. Verdell Carmine                 Reason for Consult: Acute renal failure            History of Present Illness: Ms. Jaime Freeman is a 62 y.o. white female with hypertension, depression, restrictive lung disease, who was admitted to [facility] on 11/10/2017 for Dehydration [E86.0] Acute cystitis without hematuria [N30.00] Lithium toxicity, accidental or unintentional, initial encounter [T56.891A]  Son at bedside. Patient with lithium level of 3.18. Tremors and altered mental status. Treated with IV fluids and lithium level down to 2.74  Recently started on seroquel but claims she did not start taking the medication.    Medications: Outpatient medications: Medications Prior to Admission  Medication Sig Dispense Refill Last Dose  . aspirin EC 81 MG tablet Take 81 mg by mouth daily.   Taking  . benazepril (LOTENSIN) 20 MG tablet Take 1 tablet (20 mg total) by mouth daily. 90 tablet 1 Taking  . clonazePAM (KLONOPIN) 1 MG tablet Take 0.5-1 tablets (0.5-1 mg total) by mouth 2 (two) times daily as needed for anxiety. 60 tablet 2 Taking  . etodolac (LODINE) 400 MG tablet Take 1 tablet (400 mg total) by mouth 2 (two) times daily as needed. 180 tablet 3 Taking  . hydrochlorothiazide (MICROZIDE) 12.5 MG capsule Take 1 capsule (12.5 mg total) by mouth daily. 90 capsule 1 Taking  . lithium carbonate 300 MG capsule Take 2 capsules (600 mg total) by mouth 2 (two) times daily. 360 capsule 1 Taking  . omeprazole (PRILOSEC) 20 MG capsule Take 1 capsule (20 mg total) by mouth daily. 90 capsule 1 Taking  . QUEtiapine (SEROQUEL) 25 MG tablet Take 1 tablet (25 mg total) by mouth at bedtime. 30 tablet 0   . albuterol (PROVENTIL HFA;VENTOLIN HFA) 108 (90 Base)  MCG/ACT inhaler Inhale 2 puffs into the lungs every 4 (four) hours as needed for wheezing or shortness of breath. 1 Inhaler 3 PRN at PRN  . bisacodyl (DULCOLAX) 5 MG EC tablet Take 5 mg by mouth daily as needed for moderate constipation.   PRN at PRN  . FLUoxetine (PROZAC) 20 MG tablet Take 1.5 tablets (30 mg total) by mouth 2 (two) times daily. (Patient not taking: Reported on 11/10/2017) 270 tablet 1 Not Taking at Unknown time  . fluticasone (FLONASE) 50 MCG/ACT nasal spray Place 2 sprays into both nostrils daily. Reported on 05/08/2015 16 g 12 PRN at PRN  . nystatin (MYCOSTATIN/NYSTOP) powder Apply topically 4 (four) times daily. 60 g 3 PRN at PRN    Current medications: Current Facility-Administered Medications  Medication Dose Route Frequency Provider Last Rate Last Dose  . 0.9 %  sodium chloride infusion   Intravenous Continuous Henreitta Leber, MD 125 mL/hr at 11/11/17 0526    . acetaminophen (TYLENOL) tablet 650 mg  650 mg Oral Q6H PRN Henreitta Leber, MD       Or  . acetaminophen (TYLENOL) suppository 650 mg  650 mg Rectal Q6H PRN Abel Presto  J, MD      . albuterol (PROVENTIL) (2.5 MG/3ML) 0.083% nebulizer solution 3 mL  3 mL Inhalation Q4H PRN Henreitta Leber, MD      . aspirin EC tablet 81 mg  81 mg Oral Daily Henreitta Leber, MD   81 mg at 11/11/17 0910  . bisacodyl (DULCOLAX) EC tablet 5 mg  5 mg Oral Daily PRN Pyreddy, Pavan, MD      . cefTRIAXone (ROCEPHIN) 1 g in sodium chloride 0.9 % 100 mL IVPB  1 g Intravenous q1800 Sainani, Vivek J, MD      . fluticasone (FLONASE) 50 MCG/ACT nasal spray 2 spray  2 spray Each Nare Daily Henreitta Leber, MD   2 spray at 11/11/17 0912  . heparin injection 5,000 Units  5,000 Units Subcutaneous Q8H Henreitta Leber, MD   5,000 Units at 11/11/17 0526  . nystatin (MYCOSTATIN/NYSTOP) topical powder   Topical QID Henreitta Leber, MD      . ondansetron (ZOFRAN) tablet 4 mg  4 mg Oral Q6H PRN Henreitta Leber, MD       Or  . ondansetron  (ZOFRAN) injection 4 mg  4 mg Intravenous Q6H PRN Henreitta Leber, MD      . pantoprazole (PROTONIX) EC tablet 40 mg  40 mg Oral Daily Henreitta Leber, MD   40 mg at 11/11/17 0910  . polyethylene glycol (MIRALAX / GLYCOLAX) packet 17 g  17 g Oral Daily Pyreddy, Pavan, MD      . QUEtiapine (SEROQUEL) tablet 25 mg  25 mg Oral QHS Sainani, Belia Heman, MD      . senna-docusate (Senokot-S) tablet 1 tablet  1 tablet Oral BID Saundra Shelling, MD          Allergies: Allergies  Allergen Reactions  . Codeine Other (See Comments)  . Dynacirc [Isradipine]   . Guaifenesin & Derivatives   . Sudafed [Pseudoephedrine]   . Zithromax [Azithromycin]   . Prednisone Anxiety    Anxiousness      Past Medical History: Past Medical History:  Diagnosis Date  . Allergic rhinitis   . Anxiety   . Bipolar affective disorder ([place])   . Depression   . Hiatal hernia   . Hypertension   . IFG (impaired fasting glucose)   . Menopausal state   . Morbid obesity ([place])   . OCD (obsessive compulsive disorder)   . Panic disorder   . Restrictive lung disease   . Vitamin B12 deficiency   . Vitamin D deficiency disease      Past Surgical History: Past Surgical History:  Procedure Laterality Date  . CESAREAN SECTION    . DILATION AND CURETTAGE OF UTERUS    . TONSILLECTOMY       Family History: Family History  Problem Relation Age of Onset  . Heart disease Mother   . Heart attack Mother   . Hypertension Mother   . Cancer Father        GI tract  . Hypertension Father   . Cancer Maternal Grandmother        gallbladder  . Heart disease Maternal Grandfather   . COPD Neg Hx   . Diabetes Neg Hx   . Stroke Neg Hx      Social History: Social History   Socioeconomic History  . Marital status: Widowed    Spouse name: Not on file  . Number of children: 2  . Years of education: Not on file  . Highest  education level: Not on file  Occupational History  . Not on file  Social Needs  . Financial  resource strain: Not on file  . Food insecurity:    Worry: Sometimes true    Inability: Never true  . Transportation needs:    Medical: Patient refused    Non-medical: Patient refused  Tobacco Use  . Smoking status: Former Smoker    Types: Cigarettes    Last attempt to quit: 04/27/1987    Years since quitting: 30.5  . Smokeless tobacco: Never Used  Substance and Sexual Activity  . Alcohol use: No  . Drug use: No  . Sexual activity: Not Currently  Lifestyle  . Physical activity:    Days per week: Patient refused    Minutes per session: Patient refused  . Stress: To some extent  Relationships  . Social connections:    Talks on phone: Patient refused    Gets together: Patient refused    Attends religious service: Patient refused    Active member of club or organization: Patient refused    Attends meetings of clubs or organizations: Patient refused    Relationship status: Patient refused  . Intimate partner violence:    Fear of current or ex partner: Patient refused    Emotionally abused: Patient refused    Physically abused: Patient refused    Forced sexual activity: Patient refused  Other Topics Concern  . Not on file  Social History Narrative  . Not on file     Review of Systems: Review of Systems  Constitutional: Positive for malaise/fatigue and weight loss. Negative for chills, diaphoresis and fever.  HENT: Negative.  Negative for congestion, ear discharge, ear pain, hearing loss, nosebleeds, sinus pain, sore throat and tinnitus.   Eyes: Negative.  Negative for blurred vision, double vision, photophobia, pain, discharge and redness.  Respiratory: Negative.  Negative for cough, hemoptysis, sputum production, shortness of breath, wheezing and stridor.   Cardiovascular: Negative.  Negative for chest pain, palpitations, orthopnea, claudication, leg swelling and PND.  Gastrointestinal: Negative.  Negative for abdominal pain, blood in stool, constipation, diarrhea,  heartburn, melena, nausea and vomiting.  Genitourinary: Negative.  Negative for dysuria, flank pain, frequency, hematuria and urgency.  Musculoskeletal: Negative.  Negative for back pain, joint pain, myalgias and neck pain.  Skin: Negative.  Negative for itching and rash.  Neurological: Negative.  Negative for dizziness, tingling, tremors, sensory change, speech change, focal weakness, seizures, loss of consciousness, weakness and headaches.  Endo/Heme/Allergies: Negative.  Negative for environmental allergies and polydipsia. Does not bruise/bleed easily.  Psychiatric/Behavioral: Positive for depression and memory loss. Negative for hallucinations, substance abuse and suicidal ideas. The patient is not nervous/anxious and does not have insomnia.     Vital Signs: Blood pressure (!) 127/53, pulse 63, temperature 98.5 F (36.9 C), temperature source Oral, resp. rate 18, height 5\' 2"  (1.575 m), weight 94.3 kg (207 lb 12.8 oz), SpO2 100 %.  Weight trends: Filed Weights   11/10/17 1422 11/10/17 2031  Weight: 90.7 kg (200 lb) 94.3 kg (207 lb 12.8 oz)    Physical Exam: General: NAD,   Head: Normocephalic, atraumatic. Moist oral mucosal membranes  Eyes: Anicteric, PERRL  Neck: Supple, trachea midline  Lungs:  Clear to auscultation  Heart: Regular rate and rhythm  Abdomen:  Soft, nontender,   Extremities: no peripheral edema.  Neurologic: Nonfocal, moving all four extremities  Skin: No lesions         Lab results: Basic Metabolic Panel: Recent Labs  Lab 11/10/17 1430 11/11/17 0320  NA 134* 138  K 3.3* 3.4*  CL 102 108  CO2 22 25  GLUCOSE 106* 84  BUN 26* 20  CREATININE 1.35* 1.01*  CALCIUM 10.1 9.3    Liver Function Tests: Recent Labs  Lab 11/10/17 1430  AST 22  ALT 19  ALKPHOS 73  BILITOT 0.8  PROT 6.8  ALBUMIN 4.0   No results for input(s): LIPASE, AMYLASE in the last 168 hours. No results for input(s): AMMONIA in the last 168 hours.  CBC: Recent Labs  Lab  11/10/17 1430 11/11/17 0320  WBC 10.5 9.3  NEUTROABS 7.9*  --   HGB 13.0 11.5*  HCT 37.5 33.2*  MCV 94.8 96.5  PLT 395 358    Cardiac Enzymes: Recent Labs  Lab 11/10/17 1430  TROPONINI <0.03    BNP: Invalid input(s): POCBNP  CBG: No results for input(s): GLUCAP in the last 168 hours.  Microbiology: Results for orders placed or performed in visit on 06/07/17  Microscopic Examination     Status: Abnormal   Collection Time: 06/07/17  9:22 AM  Result Value Ref Range Status   WBC, UA 0-5 0 - 5 /hpf Final   RBC, UA 0-2 0 - 2 /hpf Final   Epithelial Cells (non renal) 0-10 0 - 10 /hpf Final   Renal Epithel, UA 0-10 (A) None seen /hpf Final   Bacteria, UA Few None seen/Few Final    Coagulation Studies: No results for input(s): LABPROT, INR in the last 72 hours.  Urinalysis: Recent Labs    11/10/17 1430  COLORURINE YELLOW*  LABSPEC 1.015  PHURINE 7.0  GLUCOSEU NEGATIVE  HGBUR NEGATIVE  BILIRUBINUR NEGATIVE  KETONESUR 5*  PROTEINUR NEGATIVE  NITRITE NEGATIVE  LEUKOCYTESUR LARGE*      Imaging: Ct Head Wo Contrast  Result Date: 11/10/2017 CLINICAL DATA:  Altered mental status EXAM: CT HEAD WITHOUT CONTRAST TECHNIQUE: Contiguous axial images were obtained from the base of the skull through the vertex without intravenous contrast. COMPARISON:  10/18/2017 FINDINGS: Brain: There is no mass, hemorrhage or extra-axial collection. The size and configuration of the ventricles and extra-axial CSF spaces are normal. There is no acute or chronic infarction. The brain parenchyma is normal. Vascular: No abnormal hyperdensity of the major intracranial arteries or dural venous sinuses. No intracranial atherosclerosis. Skull: The visualized skull base, calvarium and extracranial soft tissues are normal. Sinuses/Orbits: No fluid levels or advanced mucosal thickening of the visualized paranasal sinuses. No mastoid or middle ear effusion. The orbits are normal. IMPRESSION: Normal brain.  Electronically Signed   By: Ulyses Jarred M.D.   On: 11/10/2017 15:07      Assessment & Plan: Ms. GEORGIA DELSIGNORE is a 62 y.o. white female with hypertension, depression, restrictive lung disease, who was admitted to [facility] on 11/10/2017 for Dehydration [E86.0] Acute cystitis without hematuria [N30.00] Lithium toxicity, accidental or unintentional, initial encounter [T56.891A]  1. Acute renal failure: baseline creatinine 0.76 on 06/07/17 2. Hypokalemia 3. Lithium toxicity 4. Hypertension:   No acute indication for dialysis. Symptoms improving with IV fluids.  - holding benazepril, hydrochlorothiazide - Holding etodolac - Continue IV fluids  LOS: 1 Ivorie Uplinger 7/19/201912:25 PM

## 2017-11-11 NOTE — Progress Notes (Signed)
Initial Nutrition Assessment  DOCUMENTATION CODES:   Obesity unspecified  INTERVENTION:  Liberalize diet  Magic cup TID with meals, each supplement provides 290 kcal and 9 grams of protein  Encouraged PO intake and also discussed drinking supplements at home when she isn't feeling hungry. She says they have them, she just hasn't been drinking them.  NUTRITION DIAGNOSIS:   Inadequate oral intake related to lethargy/confusion, poor appetite as evidenced by per patient/family report, meal completion < 50%.  GOAL:   Patient will meet greater than or equal to 90% of their needs  MONITOR:   PO intake, Weight trends  REASON FOR ASSESSMENT:   Malnutrition Screening Tool    ASSESSMENT:   62 yo female with PMH bipolar disorder, obsessive-compulsive disorder, anxiety, morbid obesity, HTN, presents with weakness and lithium toxicity, and AKI.   Spoke with patient at bedside. She gives a mixed report on intentional weight loss vs skipping meals due to poor appetite. States she was trying to watch what she eats prior to losing her husband but now is dealing with lack of appetite and depression. This pattern is reiterated in her PCP visit notes. Complains of some issues with eating food at home, but often eats with her son  or goes out with friends and church members, and feels this helps her eat more. Patient did not want any eggs this morning, felt they were bland. Lunch tray was at bedside during visit but she said she wasn't hungry. Will continue to monitor PO intake.  Medications reviewed and include:  NS at 137mL/hr Labs reviewed:  K+ 3.4   NUTRITION - FOCUSED PHYSICAL EXAM:    Most Recent Value  Orbital Region  No depletion  Upper Arm Region  No depletion  Thoracic and Lumbar Region  No depletion  Buccal Region  No depletion  Temple Region  No depletion  Clavicle Bone Region  No depletion  Clavicle and Acromion Bone Region  No depletion  Scapular Bone Region  No depletion   Dorsal Hand  No depletion  Patellar Region  No depletion  Anterior Thigh Region  No depletion  Posterior Calf Region  No depletion  Edema (RD Assessment)  None  Hair  Reviewed  Eyes  Reviewed  Mouth  Reviewed  Skin  Reviewed  Nails  Reviewed       Diet Order:   Diet Order           Diet regular Room service appropriate? Yes; Fluid consistency: Thin  Diet effective now          EDUCATION NEEDS:   No education needs have been identified at this time  Skin:  Skin Assessment: Reviewed RN Assessment  Last BM:  PTA  Height:   Ht Readings from Last 1 Encounters:  11/10/17 5\' 2"  (1.575 m)    Weight:   Wt Readings from Last 1 Encounters:  11/10/17 207 lb 12.8 oz (94.3 kg)    Ideal Body Weight:  50 kg  BMI:  Body mass index is 38.01 kg/m.  Estimated Nutritional Needs:   Kcal:  1600-1900 calories  Protein:  95-105 grams  Fluid:  >1.5L    Jaime Anis. Jaime Wadle, MS, RD LDN Inpatient Clinical Dietitian Pager [phone]

## 2017-11-11 NOTE — Consult Note (Signed)
Reason for Consult:Tremor Referring Physician: Pyreddy  CC: Tremor  HPI: Jaime Freeman is an 62 y.o. female who reports that her husband died about 4 months ago.  Since that time she has had a tremor.  Describes that the tremor fluctuates in severity.  Patient admitted with AKI and Lithium toxicity.  Lithium level remains elevated.    Past Medical History:  Diagnosis Date  . Allergic rhinitis   . Anxiety   . Bipolar affective disorder ([place])   . Depression   . Hiatal hernia   . Hypertension   . IFG (impaired fasting glucose)   . Menopausal state   . Morbid obesity ([place])   . OCD (obsessive compulsive disorder)   . Panic disorder   . Restrictive lung disease   . Vitamin B12 deficiency   . Vitamin D deficiency disease     Past Surgical History:  Procedure Laterality Date  . CESAREAN SECTION    . DILATION AND CURETTAGE OF UTERUS    . TONSILLECTOMY      Family History  Problem Relation Age of Onset  . Heart disease Mother   . Heart attack Mother   . Hypertension Mother   . Cancer Father        GI tract  . Hypertension Father   . Cancer Maternal Grandmother        gallbladder  . Heart disease Maternal Grandfather   . COPD Neg Hx   . Diabetes Neg Hx   . Stroke Neg Hx     Social History:  reports that she quit smoking about 30 years ago. Her smoking use included cigarettes. She has never used smokeless tobacco. She reports that she does not drink alcohol or use drugs.  Allergies  Allergen Reactions  . Codeine Other (See Comments)  . Dynacirc [Isradipine]   . Guaifenesin & Derivatives   . Sudafed [Pseudoephedrine]   . Zithromax [Azithromycin]   . Prednisone Anxiety    Anxiousness    Medications:  I have reviewed the patient's current medications. Prior to Admission:  Medications Prior to Admission  Medication Sig Dispense Refill Last Dose  . aspirin EC 81 MG tablet Take 81 mg by mouth daily.   Taking  . benazepril (LOTENSIN) 20 MG tablet Take 1 tablet (20 mg  total) by mouth daily. 90 tablet 1 Taking  . clonazePAM (KLONOPIN) 1 MG tablet Take 0.5-1 tablets (0.5-1 mg total) by mouth 2 (two) times daily as needed for anxiety. 60 tablet 2 Taking  . etodolac (LODINE) 400 MG tablet Take 1 tablet (400 mg total) by mouth 2 (two) times daily as needed. 180 tablet 3 Taking  . hydrochlorothiazide (MICROZIDE) 12.5 MG capsule Take 1 capsule (12.5 mg total) by mouth daily. 90 capsule 1 Taking  . lithium carbonate 300 MG capsule Take 2 capsules (600 mg total) by mouth 2 (two) times daily. 360 capsule 1 Taking  . omeprazole (PRILOSEC) 20 MG capsule Take 1 capsule (20 mg total) by mouth daily. 90 capsule 1 Taking  . QUEtiapine (SEROQUEL) 25 MG tablet Take 1 tablet (25 mg total) by mouth at bedtime. 30 tablet 0   . albuterol (PROVENTIL HFA;VENTOLIN HFA) 108 (90 Base) MCG/ACT inhaler Inhale 2 puffs into the lungs every 4 (four) hours as needed for wheezing or shortness of breath. 1 Inhaler 3 PRN at PRN  . bisacodyl (DULCOLAX) 5 MG EC tablet Take 5 mg by mouth daily as needed for moderate constipation.   PRN at PRN  . FLUoxetine (PROZAC)  20 MG tablet Take 1.5 tablets (30 mg total) by mouth 2 (two) times daily. (Patient not taking: Reported on 11/10/2017) 270 tablet 1 Not Taking at Unknown time  . fluticasone (FLONASE) 50 MCG/ACT nasal spray Place 2 sprays into both nostrils daily. Reported on 05/08/2015 16 g 12 PRN at PRN  . nystatin (MYCOSTATIN/NYSTOP) powder Apply topically 4 (four) times daily. 60 g 3 PRN at PRN   Scheduled: . aspirin EC  81 mg Oral Daily  . fluticasone  2 spray Each Nare Daily  . heparin  5,000 Units Subcutaneous Q8H  . nystatin   Topical QID  . pantoprazole  40 mg Oral Daily  . polyethylene glycol  17 g Oral Daily  . QUEtiapine  25 mg Oral QHS  . senna-docusate  1 tablet Oral BID    ROS: History obtained from the patient  General ROS: negative for - chills, fatigue, fever, night sweats, weight gain or weight loss Psychological ROS:  depression Ophthalmic ROS: negative for - blurry vision, double vision, eye pain or loss of vision ENT ROS: negative for - epistaxis, nasal discharge, oral lesions, sore throat, tinnitus or vertigo Allergy and Immunology ROS: negative for - hives or itchy/watery eyes Hematological and Lymphatic ROS: negative for - bleeding problems, bruising or swollen lymph nodes Endocrine ROS: negative for - galactorrhea, hair pattern changes, polydipsia/polyuria or temperature intolerance Respiratory ROS: negative for - cough, hemoptysis, shortness of breath or wheezing Cardiovascular ROS: negative for - chest pain, dyspnea on exertion, edema or irregular heartbeat Gastrointestinal ROS: negative for - abdominal pain, diarrhea, hematemesis, nausea/vomiting or stool incontinence Genito-Urinary ROS: negative for - dysuria, hematuria, incontinence or urinary frequency/urgency Musculoskeletal ROS: negative for - joint swelling or muscular weakness Neurological ROS: as noted in HPI Dermatological ROS: negative for rash and skin lesion changes  Physical Examination: Blood pressure (!) 127/53, pulse 63, temperature 98.5 F (36.9 C), temperature source Oral, resp. rate 18, height 5\' 2"  (1.575 m), weight 94.3 kg (207 lb 12.8 oz), SpO2 100 %.  HEENT-  Normocephalic, no lesions, without obvious abnormality.  Normal external eye and conjunctiva.  Normal TM's bilaterally.  Normal auditory canals and external ears. Normal external nose, mucus membranes and septum.  Normal pharynx. Cardiovascular- S1, S2 normal, pulses palpable throughout   Lungs- chest clear, no wheezing, rales, normal symmetric air entry Abdomen- soft, non-tender; bowel sounds normal; no masses,  no organomegaly Extremities- no edema Lymph-no adenopathy palpable Musculoskeletal-no joint tenderness, deformity or swelling Skin-warm and dry, no hyperpigmentation, vitiligo, or suspicious lesions  Neurological Examination   Mental Status: Alert,  oriented, thought content appropriate.  Speech fluent without evidence of aphasia.  Able to follow 3 step commands without difficulty. Cranial Nerves: II: Discs flat bilaterally; Visual fields grossly normal, pupils equal, round, reactive to light and accommodation III,IV, VI: ptosis not present, extra-ocular motions intact bilaterally V,VII: smile symmetric, facial light touch sensation normal bilaterally VIII: hearing normal bilaterally IX,X: gag reflex present XI: bilateral shoulder shrug XII: midline tongue extension Motor: Right : Upper extremity   5/5    Left:     Upper extremity   5/5  Lower extremity   5/5     Lower extremity   5/5 Tone and bulk:normal tone throughout; no atrophy noted.  Mild bilateral upper extremity low amplitude, high frequency tremor noted.  Not noted at rest.  Does no interfere with ADL's Sensory: Pinprick and light touch intact throughout, bilaterally Deep Tendon Reflexes: 2+ and symmetric throughout Plantars: Right: downgoing   Left:  downgoing Cerebellar: No dysmetria note with finger-to-nose or heel-to-shin testing    Laboratory Studies:   Basic Metabolic Panel: Recent Labs  Lab 11/10/17 1430 11/11/17 0320  NA 134* 138  K 3.3* 3.4*  CL 102 108  CO2 22 25  GLUCOSE 106* 84  BUN 26* 20  CREATININE 1.35* 1.01*  CALCIUM 10.1 9.3    Liver Function Tests: Recent Labs  Lab 11/10/17 1430  AST 22  ALT 19  ALKPHOS 73  BILITOT 0.8  PROT 6.8  ALBUMIN 4.0   No results for input(s): LIPASE, AMYLASE in the last 168 hours. No results for input(s): AMMONIA in the last 168 hours.  CBC: Recent Labs  Lab 11/10/17 1430 11/11/17 0320  WBC 10.5 9.3  NEUTROABS 7.9*  --   HGB 13.0 11.5*  HCT 37.5 33.2*  MCV 94.8 96.5  PLT 395 358    Cardiac Enzymes: Recent Labs  Lab 11/10/17 1430  TROPONINI <0.03    BNP: Invalid input(s): POCBNP  CBG: No results for input(s): GLUCAP in the last 168 hours.  Microbiology: Results for orders placed or  performed in visit on 06/07/17  Microscopic Examination     Status: Abnormal   Collection Time: 06/07/17  9:22 AM  Result Value Ref Range Status   WBC, UA 0-5 0 - 5 /hpf Final   RBC, UA 0-2 0 - 2 /hpf Final   Epithelial Cells (non renal) 0-10 0 - 10 /hpf Final   Renal Epithel, UA 0-10 (A) None seen /hpf Final   Bacteria, UA Few None seen/Few Final    Coagulation Studies: No results for input(s): LABPROT, INR in the last 72 hours.  Urinalysis:  Recent Labs  Lab 11/10/17 1430  COLORURINE YELLOW*  LABSPEC 1.015  PHURINE 7.0  GLUCOSEU NEGATIVE  HGBUR NEGATIVE  BILIRUBINUR NEGATIVE  KETONESUR 5*  PROTEINUR NEGATIVE  NITRITE NEGATIVE  LEUKOCYTESUR LARGE*    Lipid Panel:     Component Value Date/Time   CHOL 224 (H) 06/07/2017 1117   CHOL 214 (H) 10/13/2015 1350   TRIG 169 (H) 06/07/2017 1117   TRIG 154 (H) 10/13/2015 1350   HDL 57 06/07/2017 1117   VLDL 31 (H) 10/13/2015 1350   LDLCALC 133 (H) 06/07/2017 1117    HgbA1C:  Lab Results  Component Value Date   HGBA1C 6.4 (H) 12/25/2014    Urine Drug Screen:  No results found for: LABOPIA, COCAINSCRNUR, LABBENZ, AMPHETMU, THCU, LABBARB  Alcohol Level: No results for input(s): ETH in the last 168 hours.  Other results: EKG: sinus rhythm at 72 bpm.  Imaging: Ct Head Wo Contrast  Result Date: 11/10/2017 CLINICAL DATA:  Altered mental status EXAM: CT HEAD WITHOUT CONTRAST TECHNIQUE: Contiguous axial images were obtained from the base of the skull through the vertex without intravenous contrast. COMPARISON:  10/18/2017 FINDINGS: Brain: There is no mass, hemorrhage or extra-axial collection. The size and configuration of the ventricles and extra-axial CSF spaces are normal. There is no acute or chronic infarction. The brain parenchyma is normal. Vascular: No abnormal hyperdensity of the major intracranial arteries or dural venous sinuses. No intracranial atherosclerosis. Skull: The visualized skull base, calvarium and  extracranial soft tissues are normal. Sinuses/Orbits: No fluid levels or advanced mucosal thickening of the visualized paranasal sinuses. No mastoid or middle ear effusion. The orbits are normal. IMPRESSION: Normal brain. Electronically Signed   By: Ulyses Jarred M.D.   On: 11/10/2017 15:07     Assessment/Plan: 62 year old female presenting with AKI and lithium  toxicity.  Reports tremor since the death of her husband 4 months ago.  Tremor minimal on examination today and does not interfere with activities of daily living.  May also have been exacerbated by AKI and Lithium toxicity which is still under treatment therefore would not recommend treatment at this time.  Patient may be re-evaluated for tremor after recovery from her acute illness.    No further neurologic intervention is recommended at this time.  If further questions arise, please call or page at that time.  Thank you for allowing neurology to participate in the care of this patient.   Alexis Goodell, MD Neurology [phone] 11/11/2017, 11:22 AM

## 2017-11-11 NOTE — Clinical Social Work Note (Signed)
CSW received consult for help with medications. Patient needs medication assistance. CSW notified RN CM. Atkinson signing off. Please re consult if further needs arise.   [place], [place]

## 2017-11-11 NOTE — Care Management (Signed)
CM consult for medication needs.  Patient admitted with lithium toxicity and acute renal but no indication for acute dialysis. Receiving IV fluids.  CM made two attempts to assess patient but other members of the care team were with patient. Recent death of her husband.  Psych is following

## 2017-11-12 LAB — BASIC METABOLIC PANEL
Anion gap: 6 (ref 5–15)
BUN: 11 mg/dL (ref 8–23)
CO2: 22 mmol/L (ref 22–32)
Calcium: 9.4 mg/dL (ref 8.9–10.3)
Chloride: 109 mmol/L (ref 98–111)
Creatinine, Ser: 0.8 mg/dL (ref 0.44–1.00)
GFR calc Af Amer: >60 mL/min (ref >60)
GFR calc non Af Amer: >60 mL/min (ref >60)
Glucose, Bld: 90 mg/dL (ref 70–99)
Potassium: 3.1 mmol/L — ABNORMAL LOW (ref 3.5–5.1)
Sodium: 137 mmol/L (ref 135–145)

## 2017-11-12 LAB — LITHIUM LEVEL: Lithium Lvl: 1.74 mmol/L (ref 0.60–1.20)

## 2017-11-12 LAB — URINE CULTURE: Special Requests: NORMAL

## 2017-11-12 LAB — HIV ANTIBODY (ROUTINE TESTING W REFLEX): HIV SCREEN 4TH GENERATION: NONREACTIVE

## 2017-11-12 MED ORDER — BENAZEPRIL HCL 20 MG PO TABS
20.0000 mg | ORAL_TABLET | Freq: Every day | ORAL | Status: DC
  Administered 2017-11-12 – 2017-11-14 (×3): 20 mg via ORAL
  Filled 2017-11-12 (×3): qty 1

## 2017-11-12 MED ORDER — POTASSIUM CHLORIDE CRYS ER 20 MEQ PO TBCR
40.0000 meq | EXTENDED_RELEASE_TABLET | Freq: Once | ORAL | Status: AC
  Administered 2017-11-12: 40 meq via ORAL
  Filled 2017-11-12: qty 2

## 2017-11-12 NOTE — Consult Note (Signed)
[facility] Consult   Reason for Consult: Follow-up consult for this patient came in with lithium toxicity.  Patient seen.  Chart reviewed.  Also the patient's daughter was here which added some very useful information.  The patient's daughter had a photograph to show of the patient's lithium bottle at home.  The bottle she had been using at home clearly states that she is to take 2 pills twice a day.  This is double what is documented in the older notes as being her regular dose.  As far as I can tell it does look like some kind of mistake was made and that the patient had been prescribed a double dose of her lithium.  This is yet another reason why she would wind up with the lithium toxicity we have. Referring Physician: Posey Pronto Patient Identification: Jaime Freeman MRN:  [id number] Principal Diagnosis: Lithium toxicity Diagnosis:   Patient Active Problem List   Diagnosis Date Noted  . Grief [F43.21] 11/11/2017  . Lithium toxicity [T56.891A] 11/10/2017  . Facial droop [R29.810] 10/17/2017  . 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 CKD (chronic kidney disease) stage 1, GFR 90 ml/min or greater [N18.1] 06/07/2017  . Panic attacks [F41.0] 08/13/2015  . Insomnia [G47.00] 08/13/2015  . Peripheral neuropathy [G62.9] 05/08/2015  . Osteoarthritis of both knees [M17.0] 12/25/2014  . OCD (obsessive compulsive disorder) [F42.9] 12/25/2014  . Encounter for lithium monitoring [Z51.81, Z79.899] 12/25/2014  . Dyslipidemia [E78.5] 12/25/2014  . Kidney lesion [N28.9] 12/25/2014  . Fatty liver [K76.0] 12/25/2014  . Morbid obesity ([place]) [E66.01]   . Menopausal state [N95.1]   . 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 IFG (impaired fasting glucose) [R73.01]   . Vitamin D deficiency disease [E55.9]   . Vitamin B12 deficiency [E53.8]   . Depression [F32.9]   . Anxiety [F41.9]   . Restrictive lung disease [J98.4]   . Hypertension, benign [I10] 03/17/2012    Total Time spent  with patient: 20 minutes  Subjective:   Jaime Freeman is a 61 y.o. female patient admitted with I am better".  HPI: See note above.  Patient's lithium level is down to under 2 today.  Still above normal.  Daughter says that the patient is still not her normal self.  She still sees a lot of cognitive slowing compared to baseline.  I reassured them both that this was to be expected given that her lithium level was still elevated and that it is most likely that this will all resolve over the next couple weeks.  We also reviewed her past psychiatric history and from the information I could find it sounds like the patient really never had a manic episode or a clear indication for lithium.  Also as noted above the daughter has evidence that there had been possibly a mistake causing the patient to get a double dose of lithium recently  Past Psychiatric History: Past history of mood and anxiety symptoms no hospitalization no clear mania  Risk to Self:   Risk to Others:   Prior Inpatient Therapy:   Prior Outpatient Therapy:    Past Medical History:  Past Medical History:  Diagnosis Date  . Allergic rhinitis   . Anxiety   . Bipolar affective disorder ([place])   . Depression   . Hiatal hernia   . Hypertension   . IFG (impaired fasting glucose)   . Menopausal state   . Morbid obesity ([place])   . OCD (obsessive compulsive disorder)   .  Panic disorder   . Restrictive lung disease   . Vitamin B12 deficiency   . Vitamin D deficiency disease     Past Surgical History:  Procedure Laterality Date  . CESAREAN SECTION    . DILATION AND CURETTAGE OF UTERUS    . TONSILLECTOMY     Family History:  Family History  Problem Relation Age of Onset  . Heart disease Mother   . Heart attack Mother   . Hypertension Mother   . Cancer Father        GI tract  . Hypertension Father   . Cancer Maternal Grandmother        gallbladder  . Heart disease Maternal Grandfather   . COPD Neg Hx   . Diabetes Neg Hx    . Stroke Neg Hx    Family Psychiatric  History: See prior note Social History:  Social History   Substance and Sexual Activity  Alcohol Use No     Social History   Substance and Sexual Activity  Drug Use No    Social History   Socioeconomic History  . Marital status: Widowed    Spouse name: Not on file  . Number of children: 2  . Years of education: Not on file  . Highest education level: Not on file  Occupational History  . Not on file  Social Needs  . Financial resource strain: Not on file  . Food insecurity:    Worry: Sometimes true    Inability: Never true  . Transportation needs:    Medical: Patient refused    Non-medical: Patient refused  Tobacco Use  . Smoking status: Former Smoker    Types: Cigarettes    Last attempt to quit: 04/27/1987    Years since quitting: 30.5  . Smokeless tobacco: Never Used  Substance and Sexual Activity  . Alcohol use: No  . Drug use: No  . Sexual activity: Not Currently  Lifestyle  . Physical activity:    Days per week: Patient refused    Minutes per session: Patient refused  . Stress: To some extent  Relationships  . Social connections:    Talks on phone: Patient refused    Gets together: Patient refused    Attends religious service: Patient refused    Active member of club or organization: Patient refused    Attends meetings of clubs or organizations: Patient refused    Relationship status: Patient refused  Other Topics Concern  . Not on file  Social History Narrative  . Not on file   Additional Social History:    Allergies:   Allergies  Allergen Reactions  . Codeine Other (See Comments)  . Dynacirc [Isradipine]   . Guaifenesin & Derivatives   . Sudafed [Pseudoephedrine]   . Zithromax [Azithromycin]   . Prednisone Anxiety    Anxiousness    Labs:  Results for orders placed or performed during the hospital encounter of 11/10/17 (from the past 48 hour(s))  HIV antibody (Routine Testing)     Status: None    Collection Time: 11/11/17  3:20 AM  Result Value Ref Range   HIV Screen 4th Generation wRfx Non Reactive Non Reactive    Comment: (NOTE) Performed At: [facility] [place], [place] [id number] Rush Farmer MD NF:[id number]   Basic metabolic panel     Status: Abnormal   Collection Time: 11/11/17  3:20 AM  Result Value Ref Range   Sodium 138 135 - 145 mmol/L   Potassium 3.4 (L) 3.5 -  5.1 mmol/L   Chloride 108 98 - 111 mmol/L    Comment: Please note change in reference range.   CO2 25 22 - 32 mmol/L   Glucose, Bld 84 70 - 99 mg/dL    Comment: Please note change in reference range.   BUN 20 8 - 23 mg/dL    Comment: Please note change in reference range.   Creatinine, Ser 1.01 (H) 0.44 - 1.00 mg/dL   Calcium 9.3 8.9 - 10.3 mg/dL   GFR calc non Af Amer 59 (L) >60 mL/min   GFR calc Af Amer >60 >60 mL/min    Comment: (NOTE) The eGFR has been calculated using the CKD EPI equation. This calculation has not been validated in all clinical situations. eGFR's persistently <60 mL/min signify possible Chronic Kidney Disease.    Anion gap 5 5 - 15    Comment: Performed at [facility], [address] [place], [place] [address]  CBC     Status: Abnormal   Collection Time: 11/11/17  3:20 AM  Result Value Ref Range   WBC 9.3 3.6 - 11.0 K/uL   RBC 3.44 (L) 3.80 - 5.20 MIL/uL   Hemoglobin 11.5 (L) 12.0 - 16.0 g/dL   HCT 33.2 (L) 35.0 - 47.0 %   MCV 96.5 80.0 - 100.0 fL   MCH 33.4 26.0 - 34.0 pg   MCHC 34.6 32.0 - 36.0 g/dL   RDW 12.2 11.5 - 14.5 %   Platelets 358 150 - 440 K/uL    Comment: Performed at [facility], [address] [place], [place] [address]  Lithium level     Status: Abnormal   Collection Time: 11/11/17  3:20 AM  Result Value Ref Range   Lithium Lvl 2.74 (HH) 0.60 - 1.20 mmol/L    Comment: CRITICAL RESULT CALLED TO, READ BACK BY AND VERIFIED WITH JACKIE PAGE _0  11/11/17 [facility] Performed at [facility], [address], [place], [place] [address]   Basic metabolic panel     Status: Abnormal   Collection Time: 11/12/17  6:07 AM  Result Value Ref Range   Sodium 137 135 - 145 mmol/L   Potassium 3.1 (L) 3.5 - 5.1 mmol/L   Chloride 109 98 - 111 mmol/L    Comment: Please note change in reference range.   CO2 22 22 - 32 mmol/L   Glucose, Bld 90 70 - 99 mg/dL    Comment: Please note change in reference range.   BUN 11 8 - 23 mg/dL    Comment: Please note change in reference range.   Creatinine, Ser 0.80 0.44 - 1.00 mg/dL   Calcium 9.4 8.9 - 10.3 mg/dL   GFR calc non Af Amer >60 >60 mL/min   GFR calc Af Amer >60 >60 mL/min    Comment: (NOTE) The eGFR has been calculated using the CKD EPI equation. This calculation has not been validated in all clinical situations. eGFR's persistently <60 mL/min signify possible Chronic Kidney Disease.    Anion gap 6 5 - 15    Comment: Performed at [facility], [address] [place],  [address]  Lithium level     Status: Abnormal   Collection Time: 11/12/17  6:07 AM  Result Value Ref Range   Lithium Lvl 1.74 (HH) 0.60 - 1.20 mmol/L    Comment: CRITICAL RESULT CALLED TO, READ BACK BY AND VERIFIED WITH CHRISTINE WELCH ON 11/12/17 AT 0719 QSD Performed at [facility], [address] [place],  [place] [address]     Current Facility-Administered Medications  Medication Dose Route Frequency Provider Last Rate Last Dose  . 0.9 %  sodium chloride infusion   Intravenous Continuous Henreitta Leber, MD 125 mL/hr at 11/12/17 1416    . acetaminophen (TYLENOL) tablet 650 mg  650 mg Oral Q6H PRN Henreitta Leber, MD       Or  . acetaminophen (TYLENOL) suppository 650 mg  650 mg Rectal Q6H PRN Sainani, Belia Heman, MD      . albuterol (PROVENTIL) (2.5 MG/3ML) 0.083% nebulizer solution 3 mL  3 mL Inhalation Q4H PRN Henreitta Leber, MD      . aspirin EC tablet 81 mg  81 mg Oral Daily Henreitta Leber, MD   81 mg at 11/12/17 0958  . benazepril  (LOTENSIN) tablet 20 mg  20 mg Oral Daily Pyreddy, Reatha Harps, MD   20 mg at 11/12/17 0958  . bisacodyl (DULCOLAX) EC tablet 5 mg  5 mg Oral Daily PRN Pyreddy, Reatha Harps, MD      . cefTRIAXone (ROCEPHIN) 1 g in sodium chloride 0.9 % 100 mL IVPB  1 g Intravenous q1800 Henreitta Leber, MD   Stopped at 11/11/17 1813  . fluticasone (FLONASE) 50 MCG/ACT nasal spray 2 spray  2 spray Each Nare Daily Henreitta Leber, MD   2 spray at 11/11/17 0912  . heparin injection 5,000 Units  5,000 Units Subcutaneous Q8H Henreitta Leber, MD   5,000 Units at 11/12/17 1415  . nystatin (MYCOSTATIN/NYSTOP) topical powder   Topical QID Henreitta Leber, MD      . ondansetron [facility]) tablet 4 mg  4 mg Oral Q6H PRN Henreitta Leber, MD       Or  . ondansetron (ZOFRAN) injection 4 mg  4 mg Intravenous Q6H PRN Henreitta Leber, MD      . pantoprazole (PROTONIX) EC tablet 40 mg  40 mg Oral Daily Henreitta Leber, MD   40 mg at 11/12/17 0958  . polyethylene glycol (MIRALAX / GLYCOLAX) packet 17 g  17 g Oral Daily Saundra Shelling, MD   17 g at 11/12/17 0958  . polyvinyl alcohol (LIQUIFILM TEARS) 1.4 % ophthalmic solution 1 drop  1 drop Both Eyes PRN Saundra Shelling, MD   1 drop at 11/11/17 1734  . QUEtiapine (SEROQUEL) tablet 25 mg  25 mg Oral QHS Henreitta Leber, MD   25 mg at 11/11/17 2205  . senna-docusate (Senokot-S) tablet 1 tablet  1 tablet Oral BID Saundra Shelling, MD   1 tablet at 11/12/17 2248    Musculoskeletal: Strength & Muscle Tone: within normal limits Gait & Station: normal Patient leans: N/A  Psychiatric Specialty Exam: Physical Exam  Nursing note and vitals reviewed. Constitutional: She appears well-developed and well-nourished.  HENT:  Head: Normocephalic and atraumatic.  Eyes: Pupils are equal, round, and reactive to light. Conjunctivae are normal.  Neck: Normal range of motion.  Cardiovascular: Regular rhythm and normal heart sounds.  Respiratory: Effort normal. No respiratory distress.  GI: Soft.   Musculoskeletal: Normal range of motion.  Neurological: She is alert.  Skin: Skin is warm and dry.  Psychiatric: She has a normal mood and affect. Judgment normal. Her speech is delayed. She is slowed. Thought content is not paranoid. She expresses no homicidal and no suicidal ideation. She exhibits abnormal recent memory.    Review of Systems  Constitutional: Negative.   HENT: Negative.   Eyes: Negative.   Respiratory: Negative.  Cardiovascular: Negative.   Gastrointestinal: Negative.   Musculoskeletal: Negative.   Skin: Negative.   Neurological: Negative.   Psychiatric/Behavioral: Negative.     Blood pressure 127/68, pulse 61, temperature 98.4 F (36.9 C), temperature source Oral, resp. rate 12, height _0  (1.575 m), weight 94.3 kg (207 lb 12.8 oz), SpO2 100 %.Body mass index is 38.01 kg/m.  General Appearance: Fairly Groomed  Eye Contact:  Fair  Speech:  Slow  Volume:  Decreased  Mood:  Euthymic  Affect:  Congruent  Thought Process:  Goal Directed  Orientation:  Full (Time, Place, and Person)  Thought Content:  Logical  Suicidal Thoughts:  No  Homicidal Thoughts:  No  Memory:  Immediate;   Fair Recent;   Fair Remote;   Fair  Judgement:  Fair  Insight:  Fair  Psychomotor Activity:  Decreased  Concentration:  Concentration: Fair  Recall:  [employer] of Knowledge:  Fair  Language:  Fair  Akathisia:  No  Handed:  Right  AIMS (if indicated):     Assets:  Desire for Improvement Housing Physical Health Resilience Social Support  ADL's:  Intact  Cognition:  Impaired,  Mild  Sleep:        Treatment Plan Summary: Medication management and Plan Patient seems to be recovering from the lithium toxicity and I am glad to see that nephrology did not feel that dialysis was needed.  Her lithium level is coming down pretty quickly.  Agreed to continue keeping her off of the lithium.  Based on the conversation I had with her and her daughter today I think it is best that  she not restart the lithium at discharge.  He can continue other medicine but does not need to restart lithium and should see an outpatient mental health provider for follow-up care.  I will check back as needed.  Disposition: No evidence of imminent risk to self or others at present.   Patient does not meet criteria for psychiatric inpatient admission. Supportive therapy provided about ongoing stressors.  Alethia Berthold, MD 11/12/2017 5:07 PM

## 2017-11-12 NOTE — Progress Notes (Signed)
[place] [facility] [place] NAME: Jaime Freeman    MR#:  [id number]  DATE OF BIRTH:  1956/01/30  SUBJECTIVE:  CHIEF COMPLAINT:   Chief Complaint  Patient presents with  . Altered Mental Status  Patient seen today No nausea and vomitings No abdominal pain Has tremors for last two nights Had hallucinations last night REVIEW OF SYSTEMS:    ROS  CONSTITUTIONAL: No documented fever. No fatigue, weakness. No weight gain, no weight loss.  EYES: No blurry or double vision.  ENT: No tinnitus. No postnasal drip. No redness of the oropharynx.  RESPIRATORY: No cough, no wheeze, no hemoptysis. No dyspnea.  CARDIOVASCULAR: No chest pain. No orthopnea. No palpitations. No syncope.  GASTROINTESTINAL: No nausea, no vomiting or diarrhea. No abdominal pain. No melena or hematochezia.  GENITOURINARY: No dysuria or hematuria.  ENDOCRINE: No polyuria or nocturia. No heat or cold intolerance.  HEMATOLOGY: No anemia. No bruising. No bleeding.  INTEGUMENTARY: No rashes. No lesions.  MUSCULOSKELETAL: No arthritis. No swelling. No gout.  Has tremors NEUROLOGIC: No numbness, tingling, or ataxia. No seizure-type activity.  Hallucinations on and off PSYCHIATRIC: No anxiety. No insomnia. No ADD.   DRUG ALLERGIES:   Allergies  Allergen Reactions  . Codeine Other (See Comments)  . Dynacirc [Isradipine]   . Guaifenesin & Derivatives   . Sudafed [Pseudoephedrine]   . Zithromax [Azithromycin]   . Prednisone Anxiety    Anxiousness    VITALS:  Blood pressure (!) 132/58, pulse 74, temperature 98.5 F (36.9 C), temperature source Oral, resp. rate 18, height 5\' 2"  (1.575 m), weight 94.3 kg (207 lb 12.8 oz), SpO2 100 %.  PHYSICAL EXAMINATION:   Physical Exam  GENERAL:  62 y.o.-year-old patient lying in the bed with no acute distress.  EYES: Pupils equal, round, reactive to light and accommodation. No scleral icterus. Extraocular muscles intact.  HEENT: Head  atraumatic, normocephalic. Oropharynx and nasopharynx clear.  NECK:  Supple, no jugular venous distention. No thyroid enlargement, no tenderness.  LUNGS: Normal breath sounds bilaterally, no wheezing, rales, rhonchi. No use of accessory muscles of respiration.  CARDIOVASCULAR: S1, S2 normal. No murmurs, rubs, or gallops.  ABDOMEN: Soft, nontender, nondistended. Bowel sounds present. No organomegaly or mass.  EXTREMITIES: No cyanosis, clubbing or edema b/l.    Tremors NEUROLOGIC: Cranial nerves II through XII are intact. No focal Motor or sensory deficits b/l.   PSYCHIATRIC: The patient is alert and oriented x 3.  SKIN: No obvious rash, lesion, or ulcer.   LABORATORY PANEL:   CBC Recent Labs  Lab 11/11/17 0320  WBC 9.3  HGB 11.5*  HCT 33.2*  PLT 358   ------------------------------------------------------------------------------------------------------------------ Chemistries  Recent Labs  Lab 11/10/17 1430  11/12/17 0607  NA 134*   < > 137  K 3.3*   < > 3.1*  CL 102   < > 109  CO2 22   < > 22  GLUCOSE 106*   < > 90  BUN 26*   < > 11  CREATININE 1.35*   < > 0.80  CALCIUM 10.1   < > 9.4  AST 22  --   --   ALT 19  --   --   ALKPHOS 73  --   --   BILITOT 0.8  --   --    < > = values in this interval not displayed.   ------------------------------------------------------------------------------------------------------------------  Cardiac Enzymes Recent Labs  Lab 11/10/17 1430  TROPONINI <0.03   ------------------------------------------------------------------------------------------------------------------  RADIOLOGY:  Ct Head Wo Contrast  Result Date: 11/10/2017 CLINICAL DATA:  Altered mental status EXAM: CT HEAD WITHOUT CONTRAST TECHNIQUE: Contiguous axial images were obtained from the base of the skull through the vertex without intravenous contrast. COMPARISON:  10/18/2017 FINDINGS: Brain: There is no mass, hemorrhage or extra-axial collection. The size and  configuration of the ventricles and extra-axial CSF spaces are normal. There is no acute or chronic infarction. The brain parenchyma is normal. Vascular: No abnormal hyperdensity of the major intracranial arteries or dural venous sinuses. No intracranial atherosclerosis. Skull: The visualized skull base, calvarium and extracranial soft tissues are normal. Sinuses/Orbits: No fluid levels or advanced mucosal thickening of the visualized paranasal sinuses. No mastoid or middle ear effusion. The orbits are normal. IMPRESSION: Normal brain. Electronically Signed   By: Ulyses Jarred M.D.   On: 11/10/2017 15:07     ASSESSMENT AND PLAN:   62 yr old female patient with history of bipolar disorder, pbsessive compulsive disorder, anxiety disorder under service for lithium toxicity and dehydration  - Lithium toxicity improving slowly Lithium on hold IV fluids to continue F/u levels  - Acute Kidney injury improved Resolving with IV fluids Hold Hctz  Resume ACE inhibitor  -Tremors S/p Neuro consult Probably from anxiety and possible lithium toxicity No active intervention for now  - H/O Bipolar disorder/OCD/Anxiety disorder Psych consult Continue seroquel and Klonopin Regarding lithium dosing , psychiatry f/u  - PT consult for gait and balance training  All the records are reviewed and case discussed with Care Management/Social Worker. Management plans discussed with the patient, family and they are in agreement.  CODE STATUS: Full code  DVT Prophylaxis: SCDs  TOTAL TIME TAKING CARE OF THIS PATIENT: 34 minutes.   POSSIBLE D/C IN 1 to 2 DAYS, DEPENDING ON CLINICAL CONDITION.  Saundra Shelling M.D on 11/12/2017 at 10:28 AM  Between 7am to 6pm - Pager - [phone]  After 6pm go to [email or web address] - password [email or web address] [facility]  Office  [phone]  CC: Primary care physician; Valerie Roys, DO  Note: This dictation was prepared with Dragon dictation along with  smaller phrase technology. Any transcriptional errors that result from this process are unintentional.

## 2017-11-12 NOTE — Plan of Care (Signed)
  Problem: Urinary Elimination: Goal: Signs and symptoms of infection will decrease Outcome: Progressing   Problem: Education: Goal: Knowledge of General Education information will improve Description Including pain rating scale, medication(s)/side effects and non-pharmacologic comfort measures Outcome: Progressing   Problem: Activity: Goal: Risk for activity intolerance will decrease Outcome: Progressing  Lithium levels are decreasing daily per lab results Problem: Pain Managment: Goal: General experience of comfort will improve Outcome: Progressing   Problem: Safety: Goal: Ability to remain free from injury will improve Outcome: Progressing  Pt remains on High Fall Risks; 1 assist oob with front wheel rolling walker; BSC Problem: Skin Integrity: Goal: Risk for impaired skin integrity will decrease Outcome: Progressing

## 2017-11-13 LAB — BASIC METABOLIC PANEL
Anion gap: 5 (ref 5–15)
BUN: 7 mg/dL — AB (ref 8–23)
CO2: 20 mmol/L — ABNORMAL LOW (ref 22–32)
Calcium: 9.5 mg/dL (ref 8.9–10.3)
Chloride: 114 mmol/L — ABNORMAL HIGH (ref 98–111)
Creatinine, Ser: 0.79 mg/dL (ref 0.44–1.00)
GFR calc Af Amer: >60 mL/min (ref >60)
GFR calc non Af Amer: >60 mL/min (ref >60)
Glucose, Bld: 96 mg/dL (ref 70–99)
Potassium: 4.2 mmol/L (ref 3.5–5.1)
SODIUM: 139 mmol/L (ref 135–145)

## 2017-11-13 LAB — LITHIUM LEVEL: LITHIUM LVL: 1.15 mmol/L (ref 0.60–1.20)

## 2017-11-13 MED ORDER — CEPHALEXIN 500 MG PO CAPS
500.0000 mg | ORAL_CAPSULE | Freq: Two times a day (BID) | ORAL | Status: DC
  Administered 2017-11-13 – 2017-11-14 (×3): 500 mg via ORAL
  Filled 2017-11-13 (×3): qty 1

## 2017-11-13 NOTE — Progress Notes (Signed)
PT Cancellation Note  Patient Details Name: Jaime Freeman MRN: [id number] DOB: 12-24-1955   Cancelled Treatment:    Reason Eval/Treat Not Completed: Other (comment).  Pt declined up due to feeling too weak.  Will try again in the AM.     Ramond Dial 11/13/2017, 12:34 PM   Mee Hives, PT MS Acute Rehab Dept. Number: [place] [facility] [place]

## 2017-11-13 NOTE — Progress Notes (Signed)
[facility] NAME: Jaime Freeman    MR#:  [id number]  DATE OF BIRTH:  03-Apr-1956  SUBJECTIVE:  CHIEF COMPLAINT:   Chief Complaint  Patient presents with  . Altered Mental Status  Patient seen today No nausea and vomitings No abdominal pain Has tremors for last two nights- slightly better Was SOB early morning, feels better after holding fluids. REVIEW OF SYSTEMS:    ROS  CONSTITUTIONAL: No documented fever. No fatigue, weakness. No weight gain, no weight loss.  EYES: No blurry or double vision.  ENT: No tinnitus. No postnasal drip. No redness of the oropharynx.  RESPIRATORY: No cough, no wheeze, no hemoptysis. No dyspnea.  CARDIOVASCULAR: No chest pain. No orthopnea. No palpitations. No syncope.  GASTROINTESTINAL: No nausea, no vomiting or diarrhea. No abdominal pain. No melena or hematochezia.  GENITOURINARY: No dysuria or hematuria.  ENDOCRINE: No polyuria or nocturia. No heat or cold intolerance.  HEMATOLOGY: No anemia. No bruising. No bleeding.  INTEGUMENTARY: No rashes. No lesions.  MUSCULOSKELETAL: No arthritis. No swelling. No gout.  Has tremors NEUROLOGIC: No numbness, tingling, or ataxia. No seizure-type activity.  Hallucinations on and off PSYCHIATRIC: No anxiety. No insomnia. No ADD.   DRUG ALLERGIES:   Allergies  Allergen Reactions  . Codeine Other (See Comments)  . Dynacirc [Isradipine]   . Guaifenesin & Derivatives   . Sudafed [Pseudoephedrine]   . Zithromax [Azithromycin]   . Prednisone Anxiety    Anxiousness    VITALS:  Blood pressure (!) 127/56, pulse 69, temperature 98.1 F (36.7 C), temperature source Oral, resp. rate 16, height 5\' 2"  (1.575 m), weight 94.3 kg (207 lb 12.8 oz), SpO2 100 %.  PHYSICAL EXAMINATION:   Physical Exam  GENERAL:  62 y.o.-year-old patient lying in the bed with no acute distress.  EYES: Pupils equal, round, reactive to light and accommodation. No scleral icterus.  Extraocular muscles intact.  HEENT: Head atraumatic, normocephalic. Oropharynx and nasopharynx clear.  NECK:  Supple, no jugular venous distention. No thyroid enlargement, no tenderness.  LUNGS: Normal breath sounds bilaterally, no wheezing, rales, rhonchi. No use of accessory muscles of respiration.  CARDIOVASCULAR: S1, S2 normal. No murmurs, rubs, or gallops.  ABDOMEN: Soft, nontender, nondistended. Bowel sounds present. No organomegaly or mass.  EXTREMITIES: No cyanosis, clubbing or edema b/l.    Tremors NEUROLOGIC: Cranial nerves II through XII are intact. No focal Motor or sensory deficits b/l.  Generalized weakness. PSYCHIATRIC: The patient is alert and oriented x 3.  SKIN: No obvious rash, lesion, or ulcer.   LABORATORY PANEL:   CBC Recent Labs  Lab 11/11/17 0320  WBC 9.3  HGB 11.5*  HCT 33.2*  PLT 358   ------------------------------------------------------------------------------------------------------------------ Chemistries  Recent Labs  Lab 11/10/17 1430  11/13/17 0604  NA 134*   < > 139  K 3.3*   < > 4.2  CL 102   < > 114*  CO2 22   < > 20*  GLUCOSE 106*   < > 96  BUN 26*   < > 7*  CREATININE 1.35*   < > 0.79  CALCIUM 10.1   < > 9.5  AST 22  --   --   ALT 19  --   --   ALKPHOS 73  --   --   BILITOT 0.8  --   --    < > = values in this interval not displayed.   ------------------------------------------------------------------------------------------------------------------  Cardiac Enzymes Recent Labs  Lab 11/10/17  [place] <0.03   ------------------------------------------------------------------------------------------------------------------  RADIOLOGY:  No results found.   ASSESSMENT AND PLAN:   62 yr old female patient with history of bipolar disorder, pbsessive compulsive disorder, anxiety disorder under service for lithium toxicity and dehydration  - Lithium toxicity improving slowly Lithium on hold IV fluids to continue F/u  levels Psych suggested stop lithium on discharge.  - Acute Kidney injury improved Resolving with IV fluids Hold Hctz  Resume ACE inhibitor  stopped IV fluids now.  -Tremors S/p Neuro consult Probably from anxiety and possible lithium toxicity No active intervention for now  - H/O Bipolar disorder/OCD/Anxiety disorder Psych consult Continue seroquel and Klonopin Regarding lithium dosing , psychiatry f/u  - PT consult for gait and balance training  All the records are reviewed and case discussed with Care Management/Social Worker. Management plans discussed with the patient, family and they are in agreement.  CODE STATUS: Full code  DVT Prophylaxis: SCDs  TOTAL TIME TAKING CARE OF THIS PATIENT: 34 minutes.   POSSIBLE D/C IN 1 to 2 DAYS, DEPENDING ON CLINICAL CONDITION.  Vaughan Basta M.D on 11/13/2017 at 3:55 PM  Between 7am to 6pm - Pager - [phone]  After 6pm go to [email or web address] - password [email or web address] [facility]  Office  [phone]  CC: Primary care physician; Valerie Roys, DO  Note: This dictation was prepared with Dragon dictation along with smaller phrase technology. Any transcriptional errors that result from this process are unintentional.

## 2017-11-13 NOTE — Progress Notes (Signed)
Pt c/o bilateral chest tightness and arms swelling. Denies pain. VSS. Noted slight swelling to L arm. Stopped IVF. Notified Dr Anselm Jungling of the above. IVF order d/c.

## 2017-11-14 LAB — LITHIUM LEVEL: LITHIUM LVL: 0.77 mmol/L (ref 0.60–1.20)

## 2017-11-14 LAB — BASIC METABOLIC PANEL
ANION GAP: 8 (ref 5–15)
BUN: 8 mg/dL (ref 8–23)
CALCIUM: 10 mg/dL (ref 8.9–10.3)
CO2: 21 mmol/L — ABNORMAL LOW (ref 22–32)
Chloride: 112 mmol/L — ABNORMAL HIGH (ref 98–111)
Creatinine, Ser: 0.65 mg/dL (ref 0.44–1.00)
GFR calc Af Amer: >60 mL/min (ref >60)
GFR calc non Af Amer: >60 mL/min (ref >60)
Glucose, Bld: 102 mg/dL — ABNORMAL HIGH (ref 70–99)
Potassium: 4 mmol/L (ref 3.5–5.1)
Sodium: 141 mmol/L (ref 135–145)

## 2017-11-14 MED ORDER — CEPHALEXIN 500 MG PO CAPS: 500.0000 mg | ORAL_CAPSULE | Freq: Two times a day (BID) | ORAL | 0 refills | Status: AC

## 2017-11-14 MED ORDER — ALPRAZOLAM 0.25 MG PO TABS
0.2500 mg | ORAL_TABLET | Freq: Once | ORAL | Status: AC
  Administered 2017-11-14: 0.25 mg via ORAL
  Filled 2017-11-14: qty 1

## 2017-11-14 MED ORDER — POLYETHYLENE GLYCOL 3350 17 G PO PACK: 17.0000 g | PACK | Freq: Every day | ORAL | 0 refills | Status: DC | PRN

## 2017-11-14 NOTE — Evaluation (Signed)
Physical Therapy Evaluation Patient Details Name: Jaime Freeman MRN: [id number] DOB: 01-Apr-1956 Today's Date: 11/14/2017   History of Present Illness  Pt presents to hospital on 11/10/17 and subsequently diagnosed with lithium toxicity, depression, and grief. Pts PMH includes anxiety, bipolar disorder, depression, HTN, OCD, and a panic disorder    Clinical Impression  Pt is a pleasant 62 year old femal who was admitted for lithium toxicity. Pt performs bed mobility, transfers, and ambulation with mod I to CGA. Pt demonstrates deficits with strength, balance, endurance and mobility. Pt lives alone in handicapped accessible home however family that is present states that 24/7 supervision is available upon discharge. Pt demonstrates impaired cognition, is only A&O x3 and states things throughout session that family states are inaccurate. Pt demonstrates intention tremor that limits pts mobility. Pt complains of dizziness throughout session that she rates a 3/10 that does not worsen with change of position. Pt demonstrates impaired coordination of both upper and lower extremities however is difficult to assess due to tremor. Pt demonstrates impaired balance with a noted R posterior lean that is correctable with verbal cuing. Pt amb 50' total with CGA, RW, and chair follow. Pt fatigued quickly with gait. Pt is limited mostly at this time due to tremors and impaired balance. Pt could benefit from continued skilled therapy at this time to improve deficits toward PLOF. PT will continue to work with pt at least 2x/week while admitted. D/c recommendations at this time are home with HHPT and 24/7 supervision.      Follow Up Recommendations Home health PT;Supervision/Assistance - 24 hour    Equipment Recommendations  None recommended by PT    Recommendations for Other Services       Precautions / Restrictions Precautions Precautions: None Restrictions Weight Bearing Restrictions: No      Mobility   Bed Mobility Overal bed mobility: Modified Independent             General bed mobility comments: Increased time and UE support needed to perform however does not require physical assistance  Transfers Overall transfer level: Needs assistance Equipment used: Rolling walker (2 wheeled) Transfers: Sit to/from Stand Sit to Stand: Min guard         General transfer comment: pt able to perform sit<>stand transfers safely to RW with CGA after extensive verbal cuing for sequencing and technique  Ambulation/Gait Ambulation/Gait assistance: Min guard Gait Distance (Feet): 50 Feet Assistive device: Rolling walker (2 wheeled) Gait Pattern/deviations: Step-through pattern;Decreased step length - right;Decreased step length - left     General Gait Details: pt amb 58' with CGA, RW, and a chair follow. Pt demonstrates gait pattern and speed indicitive of household ambulation. No gross LOB however pt demonstrates R posterior lean that requires continued verbal cuing to correct.  Stairs            Wheelchair Mobility    Modified Rankin (Stroke Patients Only)       Balance Overall balance assessment: Needs assistance   Sitting balance-Leahy Scale: Good Sitting balance - Comments: able to sit EOB without UE support and B feet flat on floor without gross LOB. pt complains of slight dizziness sitting EOB.     Standing balance-Leahy Scale: Fair Standing balance comment: pt able to stand with B UE supported on RW demonstrating R posterior lean that is correctable with verbal cuing. Able to stand for less than 5 seconds without UE support after R posterior lean is corrected  Pertinent Vitals/Pain Pain Assessment: No/denies pain    Home Living Family/patient expects to be discharged to:: Private residence Living Arrangements: Alone(son and daughter in law available to be present 24/7 on d/c) Available Help at Discharge: Family(son and  daughter in law 24/7, daughter) Type of Home: House Home Access: Ramped entrance     Home Layout: Two level(chair lift to second floor) Home Equipment: Gilford Rile - 2 wheels;Walker - 4 wheels;Cane - single point;Electric scooter      Prior Function Level of Independence: Independent         Comments: pt independent in community at [facility], states she is occasionally limited to shorter distances due to B knee OA. Pt states that she has had 2 mechanical falls in last year that did not result in injury, pt able to get up on own.     Hand Dominance        Extremity/Trunk Assessment   Upper Extremity Assessment Upper Extremity Assessment: Overall WFL for tasks assessed(grossly at least 4/5, impaired coordination)    Lower Extremity Assessment Lower Extremity Assessment: Difficult to assess due to impaired cognition(pt cant follow commands for MMT, at least 4/5 during mobilit)       Communication   Communication: No difficulties  Cognition Arousal/Alertness: Awake/alert Behavior During Therapy: WFL for tasks assessed/performed Overall Cognitive Status: Impaired/Different from baseline Area of Impairment: Orientation                 Orientation Level: Disoriented to;Time             General Comments: pt displays impaired cognition, she is disoriented to time as well as throughout session states several things that are inaccurate according to family that is present including that she hasn't been out of bed in 4 weeks even though she has been up to [facility] with nursing several times and that her brother is able to be at home 24/7 upon d/c instead of her son      General Comments      Exercises Other Exercises Other Exercises: Pt instructed in B LE exercises x10 reps each including LAQ, SLR, hip abd, and ankle pumps   Assessment/Plan    PT Assessment Patient needs continued PT services  PT Problem List Decreased strength;Decreased range of motion;Decreased activity  tolerance;Decreased balance;Decreased mobility;Decreased coordination;Decreased cognition;Decreased knowledge of use of DME;Decreased safety awareness       PT Treatment Interventions DME instruction;Gait training;Stair training;Functional mobility training;Therapeutic activities;Therapeutic exercise;Balance training;Neuromuscular re-education;Cognitive remediation;Patient/family education    PT Goals (Current goals can be found in the Care Plan section)  Acute Rehab PT Goals Patient Stated Goal: pt states that she wants to get back to her PLOF PT Goal Formulation: With patient Time For Goal Achievement: 11/28/17 Potential to Achieve Goals: Good    Frequency Min 2X/week   Barriers to discharge        Co-evaluation               AM-PAC PT "6 Clicks" Daily Activity  Outcome Measure Difficulty turning over in bed (including adjusting bedclothes, sheets and blankets)?: A Little Difficulty moving from lying on back to sitting on the side of the bed? : A Little Difficulty sitting down on and standing up from a chair with arms (e.g., wheelchair, bedside commode, etc,.)?: Unable Help needed moving to and from a bed to chair (including a wheelchair)?: A Little Help needed walking in hospital room?: A Little Help needed climbing 3-5 steps with a railing? : A Little 6 Click  Score: 16    End of Session Equipment Utilized During Treatment: Gait belt Activity Tolerance: Patient tolerated treatment well Patient left: in chair;with call bell/phone within reach;with chair alarm set;with family/visitor present   PT Visit Diagnosis: Unsteadiness on feet (R26.81);Other abnormalities of gait and mobility (R26.89);Repeated falls (R29.6);Muscle weakness (generalized) (M62.81);History of falling (Z91.81);Difficulty in walking, not elsewhere classified (R26.2);Dizziness and giddiness (R42)    Time: 1219-7588 PT Time Calculation (min) (ACUTE ONLY): 42 min   Charges:         PT G Codes:        Shakeema Lippman, SPT   Florena Kozma 11/14/2017, 10:18 AM

## 2017-11-14 NOTE — Progress Notes (Signed)
Pt is being discharged home with [facility]. Discharge papers given and explained to pt and Doroteo Bradford, daughter in Sports coach. Jaime Freeman verbalized understanding. Meds and f/u appointment reviewed. RX to be picked up from pharmacy.

## 2017-11-14 NOTE — Discharge Summary (Signed)
[facility] NAME: Jaime Freeman    MR#:  [id number]  DATE OF BIRTH:  03/01/1956  DATE OF ADMISSION:  11/10/2017 ADMITTING PHYSICIAN: Henreitta Leber, MD  DATE OF DISCHARGE: 11/14/2017   PRIMARY CARE PHYSICIAN: Valerie Roys, DO    ADMISSION DIAGNOSIS:  Dehydration [E86.0] Acute cystitis without hematuria [N30.00] Lithium toxicity, accidental or unintentional, initial encounter [T56.891A]  DISCHARGE DIAGNOSIS:  Principal Problem:   Lithium toxicity Active Problems:   Depression   Grief   SECONDARY DIAGNOSIS:   Past Medical History:  Diagnosis Date  . Allergic rhinitis   . Anxiety   . Bipolar affective disorder ([place])   . Depression   . Hiatal hernia   . Hypertension   . IFG (impaired fasting glucose)   . Menopausal state   . Morbid obesity ([place])   . OCD (obsessive compulsive disorder)   . Panic disorder   . Restrictive lung disease   . Vitamin B12 deficiency   . Vitamin D deficiency disease     HOSPITAL COURSE:   62 yr old female patient with history of bipolar disorder, pbsessive compulsive disorder, anxiety disorder under service for lithium toxicity and dehydration  - Lithium toxicity improving slowly Lithium on hold IV fluids to continue F/u levels Psych suggested stop lithium on discharge.  - Acute Kidney injury improved Resolving with IV fluids Hold Hctz  Resume ACE inhibitor  stopped IV fluids now.  -Tremors S/p Neuro consult Probably from anxiety and possible lithium toxicity No active intervention for now.  - H/O Bipolar disorder/OCD/Anxiety disorder Psych consult Continue seroquel .  - PT consult for gait and balance training   DISCHARGE CONDITIONS:   Stable.  CONSULTS OBTAINED:  Treatment Team:  Lavonia Dana, MD Clapacs, Madie Reno, MD Alexis Goodell, MD  DRUG ALLERGIES:   Allergies  Allergen Reactions  . Codeine Other (See Comments)  . Dynacirc  [Isradipine]   . Guaifenesin & Derivatives   . Sudafed [Pseudoephedrine]   . Zithromax [Azithromycin]   . Prednisone Anxiety    Anxiousness    DISCHARGE MEDICATIONS:   Allergies as of 11/14/2017      Reactions   Codeine Other (See Comments)   Dynacirc [isradipine]    Guaifenesin & Derivatives    Sudafed [pseudoephedrine]    Zithromax [azithromycin]    Prednisone Anxiety   Anxiousness      Medication List    STOP taking these medications   clonazePAM 1 MG tablet Commonly known as:  KLONOPIN   FLUoxetine 20 MG tablet Commonly known as:  PROZAC   hydrochlorothiazide 12.5 MG capsule Commonly known as:  MICROZIDE   lithium carbonate 300 MG capsule     TAKE these medications   albuterol 108 (90 Base) MCG/ACT inhaler Commonly known as:  PROVENTIL HFA;VENTOLIN HFA Inhale 2 puffs into the lungs every 4 (four) hours as needed for wheezing or shortness of breath.   aspirin EC 81 MG tablet Take 81 mg by mouth daily.   benazepril 20 MG tablet Commonly known as:  LOTENSIN Take 1 tablet (20 mg total) by mouth daily.   bisacodyl 5 MG EC tablet Commonly known as:  DULCOLAX Take 5 mg by mouth daily as needed for moderate constipation.   cephALEXin 500 MG capsule Commonly known as:  KEFLEX Take 1 capsule (500 mg total) by mouth every 12 (twelve) hours for 2 days.   etodolac 400 MG tablet Commonly known as:  LODINE Take 1 tablet (  400 mg total) by mouth 2 (two) times daily as needed.   fluticasone 50 MCG/ACT nasal spray Commonly known as:  FLONASE Place 2 sprays into both nostrils daily. Reported on 05/08/2015   nystatin powder Commonly known as:  MYCOSTATIN/NYSTOP Apply topically 4 (four) times daily.   omeprazole 20 MG capsule Commonly known as:  PRILOSEC Take 1 capsule (20 mg total) by mouth daily.   polyethylene glycol packet Commonly known as:  MIRALAX / GLYCOLAX Take 17 g by mouth daily as needed for moderate constipation.   QUEtiapine 25 MG tablet Commonly  known as:  SEROQUEL Take 1 tablet (25 mg total) by mouth at bedtime.        DISCHARGE INSTRUCTIONS:    Follow with PMD in 1-2 weeks,  If you experience worsening of your admission symptoms, develop shortness of breath, life threatening emergency, suicidal or homicidal thoughts you must seek medical attention immediately by calling 911 or calling your MD immediately  if symptoms less severe.  You Must read complete instructions/literature along with all the possible adverse reactions/side effects for all the Medicines you take and that have been prescribed to you. Take any new Medicines after you have completely understood and accept all the possible adverse reactions/side effects.   Please note  You were cared for by a hospitalist during your hospital stay. If you have any questions about your discharge medications or the care you received while you were in the hospital after you are discharged, you can call the unit and asked to speak with the hospitalist on call if the hospitalist that took care of you is not available. Once you are discharged, your primary care physician will handle any further medical issues. Please note that NO REFILLS for any discharge medications will be authorized once you are discharged, as it is imperative that you return to your primary care physician (or establish a relationship with a primary care physician if you do not have one) for your aftercare needs so that they can reassess your need for medications and monitor your lab values.    Today   CHIEF COMPLAINT:   Chief Complaint  Patient presents with  . Altered Mental Status    HISTORY OF PRESENT ILLNESS:  Jaime Freeman  is a 62 y.o. female with a known history of bipolar disorder, obsessive-compulsive disorder, anxiety, morbid obesity, hypertension who presents to the hospital due to weakness, tremors, difficulty ambulating over the past 2 weeks.  Patient says she developed profound weakness and tremor  over the past few weeks which has progressively gotten worse, and went to see her primary care physician but they could not find a reason for it.  Her symptoms were getting progressively worse so her family was concerned and brought her to the ER for further evaluation.  In the emergency room on routine blood work patient was noted to be in acute kidney injury and her lithium level was noted to be significantly high at 3.18.  Patient was noted to have symptoms of lithium toxicity and therefore hospitalist services were contacted for admission.  Patient also complains of some urinary frequency, dysuria over the past few days.  Urinalysis is also positive for urinary tract infection.    VITAL SIGNS:  Blood pressure 135/81, pulse 79, temperature 98.4 F (36.9 C), temperature source Oral, resp. rate 18, height 5\' 2"  (1.575 m), weight 94.3 kg (207 lb 12.8 oz), SpO2 96 %.  I/O:    Intake/Output Summary (Last 24 hours) at 11/14/2017 1353 Last  data filed at 11/14/2017 1019 Gross per 24 hour  Intake 390 ml  Output -  Net 390 ml    PHYSICAL EXAMINATION:   GENERAL:  62 y.o.-year-old patient lying in the bed with no acute distress.  EYES: Pupils equal, round, reactive to light and accommodation. No scleral icterus. Extraocular muscles intact.  HEENT: Head atraumatic, normocephalic. Oropharynx and nasopharynx clear.  NECK:  Supple, no jugular venous distention. No thyroid enlargement, no tenderness.  LUNGS: Normal breath sounds bilaterally, no wheezing, rales, rhonchi. No use of accessory muscles of respiration.  CARDIOVASCULAR: S1, S2 normal. No murmurs, rubs, or gallops.  ABDOMEN: Soft, nontender, nondistended. Bowel sounds present. No organomegaly or mass.  EXTREMITIES: No cyanosis, clubbing or edema b/l.    Tremors NEUROLOGIC: Cranial nerves II through XII are intact. No focal Motor or sensory deficits b/l.  Generalized weakness. PSYCHIATRIC: The patient is alert and oriented x 3.  SKIN: No  obvious rash, lesion, or ulcer.    DATA REVIEW:   CBC Recent Labs  Lab 11/11/17 0320  WBC 9.3  HGB 11.5*  HCT 33.2*  PLT 358    Chemistries  Recent Labs  Lab 11/10/17 1430  11/14/17 0405  NA 134*   < > 141  K 3.3*   < > 4.0  CL 102   < > 112*  CO2 22   < > 21*  GLUCOSE 106*   < > 102*  BUN 26*   < > 8  CREATININE 1.35*   < > 0.65  CALCIUM 10.1   < > 10.0  AST 22  --   --   ALT 19  --   --   ALKPHOS 73  --   --   BILITOT 0.8  --   --    < > = values in this interval not displayed.    Cardiac Enzymes Recent Labs  Lab 11/10/17 1430  TROPONINI <0.03    Microbiology Results  Results for orders placed or performed during the hospital encounter of 11/10/17  Urine culture     Status: Abnormal   Collection Time: 11/10/17  5:05 PM  Result Value Ref Range Status   Specimen Description   Final    URINE, RANDOM Performed at [facility], [address] [place], [place] [address]    Special Requests   Final    Normal Performed at [facility], [address] [place], [place] [address]    Culture MULTIPLE SPECIES PRESENT, SUGGEST RECOLLECTION (A)  Final   Report Status 11/12/2017 FINAL  Final    RADIOLOGY:  No results found.  EKG:   Orders placed or performed during the hospital encounter of 11/10/17  . ED EKG  . ED EKG  . EKG 12-Lead  . EKG 12-Lead      Management plans discussed with the patient, family and they are in agreement.  CODE STATUS:     Code Status Orders  (From admission, onward)        Start     Ordered   11/10/17 2031  Full code  Continuous     11/10/17 2030    Code Status History    This patient has a current code status but no historical code status.    Advance Directive Documentation     Most Recent Value  Type of Advance Directive  Healthcare Power of Attorney  Pre-existing out of facility DNR order (yellow form or pink MOST form)  -  "MOST" Form in Place?  -  TOTAL TIME TAKING CARE OF  THIS PATIENT: 35 minutes.    Vaughan Basta M.D on 11/14/2017 at 1:53 PM  Between 7am to 6pm - Pager - [phone]  After 6pm go to [email or web address] - password [email or web address] [facility]  Office  [phone]  CC: Primary care physician; Valerie Roys, DO   Note: This dictation was prepared with Dragon dictation along with smaller phrase technology. Any transcriptional errors that result from this process are unintentional.

## 2017-11-14 NOTE — Care Management Note (Addendum)
Case Management Note  Patient Details  Name: Jaime Freeman MRN: [id number] Date of Birth: 1956/03/13  Subjective/Objective:                  Admitted to [facility] with the diagnosis of lithium toxicity. Lives alone. Sister is Pamala Hurry [phone]). Last seen Park Liter DO at [facility] 11/04/17. No home health. No skilled facility. Usually takes care of all basic activities of daily living herself. [place] prior to this admission. Lost 45 pounds the last 6 months.  Psych evaluation completed per Dr. Algie Coffer 11/12/17. Recommending outpatient psych follow-up.  Action/Plan: Prescriptions are prescribed per [facility]  Sent message over to insurance verification. Chart reviews indicates [place] as Insurance underwriter, No insurance per verification department. Physical therapy is recommending services. Telephone call to [place], [place] representative. Will see if [facility] can do charity care.  Also, will give Medication Management applicationyes  Expected Discharge Date:  11/12/17               Expected Discharge Plan:     In-House Referral:   yes  Discharge planning Services     Post Acute Care Choice:    Choice offered to:     DME Arranged:    DME Agency:     HH Arranged:   yes HH Agency:    [facility]  Status of Service:     If discussed at [place] of Stay Meetings, dates discussed:    Additional Comments:  Shelbie Ammons, RN MSN CCM Care Management [phone] 11/14/2017, 8:52 AM

## 2017-11-16 ENCOUNTER — Telehealth: Payer: Self-pay

## 2017-11-16 NOTE — Telephone Encounter (Signed)
Transition Care Management Follow-up Telephone Call  How have you been since you were released from the hospital? "im feeling pretty well today"  Do you understand why you were in the hospital? yes  Do you have a copy of your discharge instructions Yes Do you understand the discharge instrcutions? yes  Where were you discharged to? Home  Do you have support at home? Yes    Items Reviewed:  Medications obtained Yes  Medications reviewed: Yes  Dietary changes reviewed: yes  Home Health? N/A  DME ordered at discharge obtained? NA  Medical supplies: NA    Functional Questionnaire:   Activities of Daily Living (ADLs):   She states they are independent in the following: ambulation, bathing and hygiene, feeding, continence, grooming, toileting, dressing and medication management States they require assistance with the following: none  Any transportation issues/concerns?: yes, will work with someone for trasnportation.   Any patient concerns? no  Confirmed importance and date/time of follow-up visits scheduled with PCP: yes, she will call back to schedule   Confirm appointment scheduled with specialist? Yes  Confirmed with patient if condition begins to worsen call PCP or If it's emergency go to the ER.

## 2017-11-17 ENCOUNTER — Telehealth: Payer: Self-pay | Admitting: Family Medicine

## 2017-11-17 NOTE — Telephone Encounter (Signed)
Copied from [place] [id number]. Topic: General - Other >> Nov 17, 2017  3:38 PM Mcneil, Ja-Kwan wrote: Reason for CRM: Antonietta Breach with [facility] requests verbal orders for nursing 1 time a week for 1 week, 2 times a week for 1 week, and 1 time a week for 2 weeks. Pt did not need Home Health aide for bath. Cb# [phone]

## 2017-11-17 NOTE — Telephone Encounter (Signed)
OK to give verbal orders 

## 2017-11-18 ENCOUNTER — Ambulatory Visit (INDEPENDENT_AMBULATORY_CARE_PROVIDER_SITE_OTHER): Payer: Self-pay | Admitting: Family Medicine

## 2017-11-18 ENCOUNTER — Encounter: Payer: Self-pay | Admitting: Family Medicine

## 2017-11-18 VITALS — BP 144/81 | HR 66 | Temp 98.4°F | Wt 208.2 lb

## 2017-11-18 DIAGNOSIS — F332 Major depressive disorder, recurrent severe without psychotic features: Secondary | ICD-10-CM

## 2017-11-18 DIAGNOSIS — Z87440 Personal history of urinary (tract) infections: Secondary | ICD-10-CM

## 2017-11-18 DIAGNOSIS — T56891A Toxic effect of other metals, accidental (unintentional), initial encounter: Secondary | ICD-10-CM

## 2017-11-18 MED ORDER — QUETIAPINE FUMARATE 25 MG PO TABS: 25.0000 mg | ORAL_TABLET | Freq: Every day | ORAL | 0 refills | Status: DC

## 2017-11-18 MED ORDER — POLYETHYLENE GLYCOL 3350 17 G PO PACK: 17.0000 g | PACK | Freq: Three times a day (TID) | ORAL | 0 refills | Status: DC | PRN

## 2017-11-18 NOTE — Progress Notes (Signed)
BP (!) 144/81 (BP Location: Left Arm, Patient Position: Sitting, Cuff Size: Large)   Pulse 66   Temp 98.4 F (36.9 C)   Wt 208 lb 3 oz (94.4 kg)   SpO2 100%   BMI 38.08 kg/m    Subjective:    Patient ID: Jaime Freeman, female    DOB: 1955-12-06, 62 y.o.   MRN: [id number]  HPI: Jaime Freeman is a 62 y.o. female  Chief Complaint  Patient presents with  . TOC hospital follow up   Transition of [facility] Follow up.   Hospital/Facility: [facility] D/C Physician: Dr. Anselm Jungling D/C Date: 11/14/17  Records Requested: 11/14/17 Records Received: 11/14/17 Records Reviewed: 11/14/17  Diagnoses on Discharge: Lithium toxicity, depression, grief  Date of interactive Contact within 48 hours of discharge: 11/16/17 Contact was through: phone  Date of 7 day or 14 day face-to-face visit: 11/18/17   within 7 days  Outpatient Encounter Medications as of 11/18/2017  Medication Sig  . albuterol (PROVENTIL HFA;VENTOLIN HFA) 108 (90 Base) MCG/ACT inhaler Inhale 2 puffs into the lungs every 4 (four) hours as needed for wheezing or shortness of breath.  Marland Kitchen aspirin EC 81 MG tablet Take 81 mg by mouth daily.  . benazepril (LOTENSIN) 20 MG tablet Take 1 tablet (20 mg total) by mouth daily.  . bisacodyl (DULCOLAX) 5 MG EC tablet Take 5 mg by mouth daily as needed for moderate constipation.  Marland Kitchen etodolac (LODINE) 400 MG tablet Take 1 tablet (400 mg total) by mouth 2 (two) times daily as needed.  . fluticasone (FLONASE) 50 MCG/ACT nasal spray Place 2 sprays into both nostrils daily. Reported on 05/08/2015  . nystatin (MYCOSTATIN/NYSTOP) powder Apply topically 4 (four) times daily.  Marland Kitchen omeprazole (PRILOSEC) 20 MG capsule Take 1 capsule (20 mg total) by mouth daily.  . polyethylene glycol (MIRALAX / GLYCOLAX) packet Take 17 g by mouth 3 (three) times daily as needed for moderate constipation.  . QUEtiapine (SEROQUEL) 25 MG tablet Take 1 tablet (25 mg total) by mouth at bedtime. And 1/2 tab during the day PRN  anxiety  . [DISCONTINUED] polyethylene glycol (MIRALAX / GLYCOLAX) packet Take 17 g by mouth daily as needed for moderate constipation.  . [DISCONTINUED] QUEtiapine (SEROQUEL) 25 MG tablet Take 1 tablet (25 mg total) by mouth at bedtime.   No facility-administered encounter medications on file as of 11/18/2017.     Diagnostic Tests Reviewed:  Skull: The visualized skull base, calvarium and extracranial soft tissues are normal.  Sinuses/Orbits: No fluid levels or advanced mucosal thickening of the visualized paranasal sinuses. No mastoid or middle ear effusion. The orbits are normal.  IMPRESSION: Normal brain.  Disposition: Home  Consults: Psychiatry, Nephrology, Neurology  Discharge Instructions: Follow up with specialists and here  Disease/illness Education: Given in [place] Discussions/Referrals: Established home PT- continue  Establishment or re-establishment of referral orders for community resources: Established home PT- continue  Discussion with other health care providers: N/A  Assessment and Support of treatment regimen adherence: Excellent  Appointments Coordinated with:  Patient   Relevant past medical, surgical, family and social history reviewed and updated as indicated. Interim medical history since our last visit reviewed. Allergies and medications reviewed and updated.  Review of Systems  Constitutional: Negative.   Respiratory: Negative.   Cardiovascular: Negative.   Neurological: Positive for tremors and weakness. Negative for dizziness, seizures, syncope, facial asymmetry, speech difficulty, light-headedness, numbness and headaches.  Psychiatric/Behavioral: Positive for sleep disturbance. Negative for agitation, behavioral  problems, confusion, decreased concentration, dysphoric mood, hallucinations, self-injury and suicidal ideas. The patient is not nervous/anxious and is not hyperactive.     Per HPI unless  specifically indicated above     Objective:    BP (!) 144/81 (BP Location: Left Arm, Patient Position: Sitting, Cuff Size: Large)   Pulse 66   Temp 98.4 F (36.9 C)   Wt 208 lb 3 oz (94.4 kg)   SpO2 100%   BMI 38.08 kg/m   Wt Readings from Last 3 Encounters:  11/18/17 208 lb 3 oz (94.4 kg)  11/10/17 207 lb 12.8 oz (94.3 kg)  11/07/17 205 lb 11.2 oz (93.3 kg)    Physical Exam  Constitutional: She is oriented to person, place, and time. She appears well-developed and well-nourished. No distress.  HENT:  Head: Normocephalic and atraumatic.  Right Ear: Hearing normal.  Left Ear: Hearing normal.  Nose: Nose normal.  Eyes: Conjunctivae and lids are normal. Right eye exhibits no discharge. Left eye exhibits no discharge. No scleral icterus.  Cardiovascular: Normal rate, regular rhythm, normal heart sounds and intact distal pulses. Exam reveals no gallop and no friction rub.  No murmur heard. Pulmonary/Chest: Effort normal and breath sounds normal. No stridor. No respiratory distress. She has no wheezes. She has no rales. She exhibits no tenderness.  Musculoskeletal: Normal range of motion.  Neurological: She is alert and oriented to person, place, and time.  Skin: Skin is warm, dry and intact. Capillary refill takes less than 2 seconds. No rash noted. She is not diaphoretic. No erythema. No pallor.  Psychiatric: She has a normal mood and affect. Her speech is normal and behavior is normal. Judgment and thought content normal. Cognition and memory are normal.  Nursing note and vitals reviewed.   Results for orders placed or performed during the hospital encounter of 11/10/17  Urine culture  Result Value Ref Range   Specimen Description      URINE, RANDOM Performed at [facility], [address] [place], [place] [address]    Special Requests      Normal Performed at [facility], [address] [place], [place] [address]    Culture MULTIPLE SPECIES  PRESENT, SUGGEST RECOLLECTION (A)    Report Status 11/12/2017 FINAL   CBC with Differential  Result Value Ref Range   WBC 10.5 3.6 - 11.0 K/uL   RBC 3.95 3.80 - 5.20 MIL/uL   Hemoglobin 13.0 12.0 - 16.0 g/dL   HCT 37.5 35.0 - 47.0 %   MCV 94.8 80.0 - 100.0 fL   MCH 33.0 26.0 - 34.0 pg   MCHC 34.8 32.0 - 36.0 g/dL   RDW 12.0 11.5 - 14.5 %   Platelets 395 150 - 440 K/uL   Neutrophils Relative % 75 %   Neutro Abs 7.9 (H) 1.4 - 6.5 K/uL   Lymphocytes Relative 16 %   Lymphs Abs 1.7 1.0 - 3.6 K/uL   Monocytes Relative 6 %   Monocytes Absolute 0.6 0.2 - 0.9 K/uL   Eosinophils Relative 2 %   Eosinophils Absolute 0.2 0 - 0.7 K/uL   Basophils Relative 1 %   Basophils Absolute 0.1 0 - 0.1 K/uL  Comprehensive metabolic panel  Result Value Ref Range   Sodium 134 (L) 135 - 145 mmol/L   Potassium 3.3 (L) 3.5 - 5.1 mmol/L   Chloride 102 98 - 111 mmol/L   CO2 22 22 - 32 mmol/L   Glucose, Bld 106 (H) 70 - 99 mg/dL  BUN 26 (H) 8 - 23 mg/dL   Creatinine, Ser 1.35 (H) 0.44 - 1.00 mg/dL   Calcium 10.1 8.9 - 10.3 mg/dL   Total Protein 6.8 6.5 - 8.1 g/dL   Albumin 4.0 3.5 - 5.0 g/dL   AST 22 15 - 41 U/L   ALT 19 0 - 44 U/L   Alkaline Phosphatase 73 38 - 126 U/L   Total Bilirubin 0.8 0.3 - 1.2 mg/dL   GFR calc non Af Amer 41 (L) >60 mL/min   GFR calc Af Amer 48 (L) >60 mL/min   Anion gap 10 5 - 15  Troponin I  Result Value Ref Range   Troponin I <0.03 <0.03 ng/mL  Urinalysis, Complete w Microscopic  Result Value Ref Range   Color, Urine YELLOW (A) YELLOW   APPearance CLOUDY (A) CLEAR   Specific Gravity, Urine 1.015 1.005 - 1.030   pH 7.0 5.0 - 8.0   Glucose, UA NEGATIVE NEGATIVE mg/dL   Hgb urine dipstick NEGATIVE NEGATIVE   Bilirubin Urine NEGATIVE NEGATIVE   Ketones, ur 5 (A) NEGATIVE mg/dL   Protein, ur NEGATIVE NEGATIVE mg/dL   Nitrite NEGATIVE NEGATIVE   Leukocytes, UA LARGE (A) NEGATIVE   WBC, UA >50 (H) 0 - 5 WBC/hpf   Bacteria, UA RARE (A) NONE SEEN   Squamous Epithelial /  LPF >50 (H) 0 - 5   Mucus PRESENT    Non Squamous Epithelial 0-5 (A) NONE SEEN  Lithium level  Result Value Ref Range   Lithium Lvl 3.18 (HH) 0.60 - 1.20 mmol/L  HIV antibody (Routine Testing)  Result Value Ref Range   HIV Screen 4th Generation wRfx Non Reactive Non Reactive  Basic metabolic panel  Result Value Ref Range   Sodium 138 135 - 145 mmol/L   Potassium 3.4 (L) 3.5 - 5.1 mmol/L   Chloride 108 98 - 111 mmol/L   CO2 25 22 - 32 mmol/L   Glucose, Bld 84 70 - 99 mg/dL   BUN 20 8 - 23 mg/dL   Creatinine, Ser 1.01 (H) 0.44 - 1.00 mg/dL   Calcium 9.3 8.9 - 10.3 mg/dL   GFR calc non Af Amer 59 (L) >60 mL/min   GFR calc Af Amer >60 >60 mL/min   Anion gap 5 5 - 15  CBC  Result Value Ref Range   WBC 9.3 3.6 - 11.0 K/uL   RBC 3.44 (L) 3.80 - 5.20 MIL/uL   Hemoglobin 11.5 (L) 12.0 - 16.0 g/dL   HCT 33.2 (L) 35.0 - 47.0 %   MCV 96.5 80.0 - 100.0 fL   MCH 33.4 26.0 - 34.0 pg   MCHC 34.6 32.0 - 36.0 g/dL   RDW 12.2 11.5 - 14.5 %   Platelets 358 150 - 440 K/uL  Lithium level  Result Value Ref Range   Lithium Lvl 2.74 (HH) 0.60 - 1.20 mmol/L  Basic metabolic panel  Result Value Ref Range   Sodium 137 135 - 145 mmol/L   Potassium 3.1 (L) 3.5 - 5.1 mmol/L   Chloride 109 98 - 111 mmol/L   CO2 22 22 - 32 mmol/L   Glucose, Bld 90 70 - 99 mg/dL   BUN 11 8 - 23 mg/dL   Creatinine, Ser 0.80 0.44 - 1.00 mg/dL   Calcium 9.4 8.9 - 10.3 mg/dL   GFR calc non Af Amer >60 >60 mL/min   GFR calc Af Amer >60 >60 mL/min   Anion gap 6 5 - 15  Lithium level  Result Value Ref Range   Lithium Lvl 1.74 (HH) 0.60 - 1.20 mmol/L  Lithium level  Result Value Ref Range   Lithium Lvl 1.15 0.60 - 1.20 mmol/L  Basic metabolic panel  Result Value Ref Range   Sodium 139 135 - 145 mmol/L   Potassium 4.2 3.5 - 5.1 mmol/L   Chloride 114 (H) 98 - 111 mmol/L   CO2 20 (L) 22 - 32 mmol/L   Glucose, Bld 96 70 - 99 mg/dL   BUN 7 (L) 8 - 23 mg/dL   Creatinine, Ser 0.79 0.44 - 1.00 mg/dL   Calcium 9.5 8.9 -  10.3 mg/dL   GFR calc non Af Amer >60 >60 mL/min   GFR calc Af Amer >60 >60 mL/min   Anion gap 5 5 - 15  Lithium level  Result Value Ref Range   Lithium Lvl 0.77 0.60 - 1.20 mmol/L  Basic metabolic panel  Result Value Ref Range   Sodium 141 135 - 145 mmol/L   Potassium 4.0 3.5 - 5.1 mmol/L   Chloride 112 (H) 98 - 111 mmol/L   CO2 21 (L) 22 - 32 mmol/L   Glucose, Bld 102 (H) 70 - 99 mg/dL   BUN 8 8 - 23 mg/dL   Creatinine, Ser 0.65 0.44 - 1.00 mg/dL   Calcium 10.0 8.9 - 10.3 mg/dL   GFR calc non Af Amer >60 >60 mL/min   GFR calc Af Amer >60 >60 mL/min   Anion gap 8 5 - 15      Assessment & Plan:   Problem List Items Addressed This Visit      Other   Depression    Will keep her just on the seroquel for now. PRN 1/2 tab during the day- recheck 2 weeks and adjust medicine as needed.       Relevant Medications   QUEtiapine (SEROQUEL) 25 MG tablet   Lithium toxicity - Primary    Down to 0.77 last check- will check labs again today. Await results. Much more like herself. Call with any concerns.       Relevant Orders   Comprehensive metabolic panel   Lithium level    Other Visit Diagnoses    Hx: UTI (urinary tract infection)       Rechecking UA today. Await results.    Relevant Orders   UA/M w/rflx Culture, Routine       Follow up plan: Return in about 2 weeks (around 12/02/2017).

## 2017-11-18 NOTE — Assessment & Plan Note (Signed)
Will keep her just on the seroquel for now. PRN 1/2 tab during the day- recheck 2 weeks and adjust medicine as needed.

## 2017-11-18 NOTE — Addendum Note (Signed)
Addended by: Sandria Manly on: 11/18/2017 04:30 PM   Modules accepted: Orders

## 2017-11-18 NOTE — Telephone Encounter (Signed)
Verbal order given  

## 2017-11-18 NOTE — Assessment & Plan Note (Signed)
Down to 0.77 last check- will check labs again today. Await results. Much more like herself. Call with any concerns.

## 2017-11-19 LAB — UA/M W/RFLX CULTURE, ROUTINE
BILIRUBIN UA: NEGATIVE
Glucose, UA: NEGATIVE
KETONES UA: NEGATIVE
NITRITE UA: NEGATIVE
Protein, UA: NEGATIVE
RBC, UA: NEGATIVE
SPEC GRAV UA: 1.01 (ref 1.005–1.030)
UUROB: 0.2 mg/dL (ref 0.2–1.0)
pH, UA: 7 (ref 5.0–7.5)

## 2017-11-19 LAB — COMPREHENSIVE METABOLIC PANEL
ALK PHOS: 77 IU/L (ref 39–117)
ALT: 56 IU/L — ABNORMAL HIGH (ref 0–32)
AST: 43 IU/L — AB (ref 0–40)
Albumin/Globulin Ratio: 1.7 (ref 1.2–2.2)
Albumin: 4 g/dL (ref 3.6–4.8)
BUN/Creatinine Ratio: 9 — ABNORMAL LOW (ref 12–28)
BUN: 7 mg/dL — AB (ref 8–27)
Bilirubin Total: 0.3 mg/dL (ref 0.0–1.2)
CALCIUM: 10.1 mg/dL (ref 8.7–10.3)
CO2: 22 mmol/L (ref 20–29)
CREATININE: 0.75 mg/dL (ref 0.57–1.00)
Chloride: 102 mmol/L (ref 96–106)
GFR calc Af Amer: 99 mL/min/{1.73_m2} (ref >59)
GFR, EST NON AFRICAN AMERICAN: 86 mL/min/{1.73_m2} (ref >59)
GLUCOSE: 107 mg/dL — AB (ref 65–99)
Globulin, Total: 2.3 g/dL (ref 1.5–4.5)
Potassium: 4.8 mmol/L (ref 3.5–5.2)
Sodium: 138 mmol/L (ref 134–144)
Total Protein: 6.3 g/dL (ref 6.0–8.5)

## 2017-11-19 LAB — LITHIUM LEVEL: LITHIUM LVL: 0.2 mmol/L — AB (ref 0.6–1.2)

## 2017-11-19 LAB — MICROSCOPIC EXAMINATION

## 2017-11-20 LAB — URINE CULTURE: Organism ID, Bacteria: NO GROWTH

## 2017-11-21 ENCOUNTER — Telehealth: Payer: Self-pay | Admitting: Family Medicine

## 2017-11-21 ENCOUNTER — Telehealth: Payer: Self-pay

## 2017-11-21 NOTE — Telephone Encounter (Signed)
Patient notified

## 2017-11-21 NOTE — Telephone Encounter (Signed)
Please let Jaime Freeman and her daughter-in-law know that her lithium level is still present, but almost gone. She does not have a UTI.

## 2017-11-21 NOTE — Telephone Encounter (Signed)
EMMI call flagged for answering sad, hopeless or anxious. CSW contacted patient regarding these concerns. CSW introduced self and explained reason for call. Patient states that she is not struggling with sadness. Patient states that her husband recently passed away and that the day of the call she was sad and just having an "off day". CSW recommended patient to Hospice services for grief counseling. Patient states that she does not feel it necessary at this time. Patient states that she attends church and that the people there have been very helpful to her. Patient states that she has no suicidal or homicidal thoughts or ideations. Patient states that if she feels it necessary she will look into grief counseling. Patient shows no other concerns at this time.   [place], [place]

## 2017-11-24 ENCOUNTER — Ambulatory Visit: Payer: Self-pay | Admitting: Family Medicine

## 2017-12-01 ENCOUNTER — Telehealth: Payer: Self-pay | Admitting: Family Medicine

## 2017-12-01 DIAGNOSIS — F332 Major depressive disorder, recurrent severe without psychotic features: Secondary | ICD-10-CM

## 2017-12-01 MED ORDER — QUETIAPINE FUMARATE 25 MG PO TABS: 25.0000 mg | ORAL_TABLET | Freq: Every day | ORAL | 1 refills | Status: DC

## 2017-12-01 NOTE — Telephone Encounter (Signed)
Copied from [place] [phone]. Topic: Quick Communication - Rx Refill/Question >> Dec 01, 2017  2:09 PM Burchel, Abbi R wrote: Medication:  QUEtiapine (SEROQUEL) 25 MG tablet   Pharmacy  Request    Preferred Pharmacy:[facility], [place], [place] [address] [place] [address] Phone: [phone] Fax: [phone]

## 2017-12-06 ENCOUNTER — Other Ambulatory Visit: Payer: Self-pay

## 2017-12-06 ENCOUNTER — Encounter: Payer: Self-pay | Admitting: Physician Assistant

## 2017-12-06 ENCOUNTER — Ambulatory Visit (INDEPENDENT_AMBULATORY_CARE_PROVIDER_SITE_OTHER): Payer: Self-pay | Admitting: Physician Assistant

## 2017-12-06 DIAGNOSIS — F332 Major depressive disorder, recurrent severe without psychotic features: Secondary | ICD-10-CM

## 2017-12-06 MED ORDER — QUETIAPINE FUMARATE 25 MG PO TABS: ORAL_TABLET | ORAL | 0 refills | Status: DC

## 2017-12-06 NOTE — Progress Notes (Signed)
Subjective:    Patient ID: Jaime Freeman, female    DOB: 01/14/1956, 62 y.o.   MRN: [id number]  Jaime Freeman is a 62 y.o. female presenting on 12/06/2017 for Depression (pt states seroquel is working well for her) and Anxiety   HPI   Following up for anxiety and depression. Was admitted a month ago for lithium toxicity. She has completely discontinued lithium and she feels night and day. She reports she was mistakenly prescribed lithium without ever being diagnosed with bipolar disorder for the past 30 years. She has stopped now and is doing very well on Seroquel. She wishes to continue this medication.   She also asks about neck pain.  Also asks about bilateral ear pain, does grind her teeth at night.   Social History   Tobacco Use  . Smoking status: Former Smoker    Types: Cigarettes    Last attempt to quit: 04/27/1987    Years since quitting: 30.6  . Smokeless tobacco: Never Used  Substance Use Topics  . Alcohol use: No  . Drug use: No    Review of Systems Per HPI unless specifically indicated above     Objective:    BP 115/60   Pulse 68   Temp 98.4 F (36.9 C) (Tympanic)   Ht 5\' 2"  (1.575 m)   Wt 208 lb 6.4 oz (94.5 kg)   SpO2 99%   BMI 38.12 kg/m   Wt Readings from Last 3 Encounters:  12/06/17 208 lb 6.4 oz (94.5 kg)  11/18/17 208 lb 3 oz (94.4 kg)  11/10/17 207 lb 12.8 oz (94.3 kg)    Physical Exam  Constitutional: She is oriented to person, place, and time. She appears well-developed and well-nourished.  HENT:  Right Ear: Tympanic membrane and external ear normal.  Left Ear: Tympanic membrane and external ear normal.  Eyes: Right eye exhibits no discharge. Left eye exhibits no discharge.  Cardiovascular: Normal rate and regular rhythm.  Pulmonary/Chest: Effort normal and breath sounds normal.  Abdominal: Soft. Bowel sounds are normal.  Neurological: She is alert and oriented to person, place, and time.  Skin: Skin is warm and dry.  Psychiatric: She  has a normal mood and affect. Her behavior is normal.   Results for orders placed or performed in visit on 11/18/17  Urine Culture  Result Value Ref Range   Urine Culture, Routine Final report    Organism ID, Bacteria No growth   Microscopic Examination  Result Value Ref Range   WBC, UA 11-30 (A) 0 - 5 /hpf   RBC, UA 0-2 0 - 2 /hpf   Epithelial Cells (non renal) 0-10 0 - 10 /hpf   Renal Epithel, UA 0-10 (A) None seen /hpf   Bacteria, UA Few None seen/Few  Comprehensive metabolic panel  Result Value Ref Range   Glucose 107 (H) 65 - 99 mg/dL   BUN 7 (L) 8 - 27 mg/dL   Creatinine, Ser 0.75 0.57 - 1.00 mg/dL   GFR calc non Af Amer 86 >59 mL/min/1.73   GFR calc Af Amer 99 >59 mL/min/1.73   BUN/Creatinine Ratio 9 (L) 12 - 28   Sodium 138 134 - 144 mmol/L   Potassium 4.8 3.5 - 5.2 mmol/L   Chloride 102 96 - 106 mmol/L   CO2 22 20 - 29 mmol/L   Calcium 10.1 8.7 - 10.3 mg/dL   Total Protein 6.3 6.0 - 8.5 g/dL   Albumin 4.0 3.6 - 4.8 g/dL   Globulin,  Total 2.3 1.5 - 4.5 g/dL   Albumin/Globulin Ratio 1.7 1.2 - 2.2   Bilirubin Total 0.3 0.0 - 1.2 mg/dL   Alkaline Phosphatase 77 39 - 117 IU/L   AST 43 (H) 0 - 40 IU/L   ALT 56 (H) 0 - 32 IU/L  Lithium level  Result Value Ref Range   Lithium Lvl 0.2 (L) 0.6 - 1.2 mmol/L  UA/M w/rflx Culture, Routine  Result Value Ref Range   Specific Gravity, UA 1.010 1.005 - 1.030   pH, UA 7.0 5.0 - 7.5   Color, UA Straw Yellow   Appearance Ur Cloudy (A) Clear   Leukocytes, UA 1+ (A) Negative   Protein, UA Negative Negative/Trace   Glucose, UA Negative Negative   Ketones, UA Negative Negative   RBC, UA Negative Negative   Bilirubin, UA Negative Negative   Urobilinogen, Ur 0.2 0.2 - 1.0 mg/dL   Nitrite, UA Negative Negative   Microscopic Examination See below:       Assessment & Plan:  1. Severe episode of recurrent major depressive disorder, without psychotic features ([place])  - QUEtiapine (SEROQUEL) 25 MG tablet; Take 25 mg nightly and 1/2  tab during the day for PRN anxiety.  Dispense: 45 tablet; Refill: 0  I have spent 25 minutes with this patient, >50% of which was spent on counseling and coordination of care.  Follow up plan: Return in about 3 months (around 03/08/2018) for follow up anxiety .  Carles Collet, PA-C  [facility] 12/07/2017, 11:59 AM

## 2017-12-07 ENCOUNTER — Emergency Department
Admission: EM | Admit: 2017-12-07 | Discharge: 2017-12-07 | Disposition: A | Discharge status: Home / Self Care | Payer: Self-pay | Attending: Emergency Medicine | Admitting: Emergency Medicine

## 2017-12-07 ENCOUNTER — Encounter: Payer: Self-pay | Admitting: Emergency Medicine

## 2017-12-07 ENCOUNTER — Other Ambulatory Visit: Payer: Self-pay

## 2017-12-07 ENCOUNTER — Ambulatory Visit: Payer: Self-pay | Admitting: *Deleted

## 2017-12-07 ENCOUNTER — Emergency Department: Payer: Self-pay

## 2017-12-07 DIAGNOSIS — Z7982 Long term (current) use of aspirin: Secondary | ICD-10-CM | POA: Insufficient documentation

## 2017-12-07 DIAGNOSIS — I129 Hypertensive chronic kidney disease with stage 1 through stage 4 chronic kidney disease, or unspecified chronic kidney disease: Secondary | ICD-10-CM | POA: Insufficient documentation

## 2017-12-07 DIAGNOSIS — N181 Chronic kidney disease, stage 1: Secondary | ICD-10-CM | POA: Insufficient documentation

## 2017-12-07 DIAGNOSIS — K5900 Constipation, unspecified: Secondary | ICD-10-CM | POA: Insufficient documentation

## 2017-12-07 DIAGNOSIS — Z87891 Personal history of nicotine dependence: Secondary | ICD-10-CM | POA: Insufficient documentation

## 2017-12-07 LAB — COMPREHENSIVE METABOLIC PANEL
ALK PHOS: 55 U/L (ref 38–126)
ALT: 13 U/L (ref 0–44)
AST: 21 U/L (ref 15–41)
Albumin: 4.1 g/dL (ref 3.5–5.0)
Anion gap: 7 (ref 5–15)
BUN: 14 mg/dL (ref 8–23)
CALCIUM: 9.8 mg/dL (ref 8.9–10.3)
CO2: 24 mmol/L (ref 22–32)
CREATININE: 0.74 mg/dL (ref 0.44–1.00)
Chloride: 108 mmol/L (ref 98–111)
GFR calc non Af Amer: >60 mL/min (ref >60)
Glucose, Bld: 105 mg/dL — ABNORMAL HIGH (ref 70–99)
Potassium: 4.8 mmol/L (ref 3.5–5.1)
SODIUM: 139 mmol/L (ref 135–145)
Total Bilirubin: 0.7 mg/dL (ref 0.3–1.2)
Total Protein: 7.2 g/dL (ref 6.5–8.1)

## 2017-12-07 LAB — URINALYSIS, COMPLETE (UACMP) WITH MICROSCOPIC
Bacteria, UA: NONE SEEN
Bilirubin Urine: NEGATIVE
GLUCOSE, UA: NEGATIVE mg/dL
HGB URINE DIPSTICK: NEGATIVE
Ketones, ur: NEGATIVE mg/dL
Nitrite: NEGATIVE
PH: 6 (ref 5.0–8.0)
Protein, ur: NEGATIVE mg/dL
SPECIFIC GRAVITY, URINE: 1.009 (ref 1.005–1.030)

## 2017-12-07 LAB — CBC
HCT: 37.3 % (ref 35.0–47.0)
Hemoglobin: 12.6 g/dL (ref 12.0–16.0)
MCH: 32.4 pg (ref 26.0–34.0)
MCHC: 33.9 g/dL (ref 32.0–36.0)
MCV: 95.7 fL (ref 80.0–100.0)
PLATELETS: 338 10*3/uL (ref 150–440)
RBC: 3.9 MIL/uL (ref 3.80–5.20)
RDW: 12.7 % (ref 11.5–14.5)
WBC: 9.8 10*3/uL (ref 3.6–11.0)

## 2017-12-07 LAB — LIPASE, BLOOD: Lipase: 40 U/L (ref 11–51)

## 2017-12-07 MED ORDER — DOCUSATE SODIUM 50 MG/5ML PO LIQD
100.0000 mg | Freq: Once | ORAL | Status: AC
Start: 2017-12-07 — End: 2017-12-07
  Administered 2017-12-07: 100 mg via ORAL

## 2017-12-07 MED ORDER — DOCUSATE SODIUM 50 MG/5ML PO LIQD
ORAL | Status: AC
  Filled 2017-12-07: qty 10

## 2017-12-07 MED ORDER — DOCUSATE SODIUM 50 MG/5ML PO LIQD
50.0000 mg | Freq: Once | ORAL | Status: DC
  Filled 2017-12-07: qty 10

## 2017-12-07 NOTE — ED Provider Notes (Signed)
[facility] Emergency Department Provider Note  ____________________________________________  Time seen: Approximately 9:24 PM  I have reviewed the triage vital signs and the nursing notes.   HISTORY  Chief Complaint Constipation   HPI Jaime Freeman is a 62 y.o. female with history as listed below including intermittent constipation who presents for evaluation of constipation.  Patient reports that her last bowel movement was normal 4 days ago.  No bowel movement for the last 3 days which is not atypical for her.  She reports that she is feeling a lot of pressure in her rectum and she is concerned that she is impacted.  She has never been disimpacted before.  She denies abdominal pain, nausea, vomiting.  She is passing flatus.  She has had a prior C-section but no other abdominal surgeries.  No dysuria, hematuria, chest pain or shortness of breath.  Past Medical History:  Diagnosis Date  . Allergic rhinitis   . Anxiety   . Bipolar affective disorder ([place])   . Depression   . Hiatal hernia   . Hypertension   . IFG (impaired fasting glucose)   . Menopausal state   . Morbid obesity ([place])   . OCD (obsessive compulsive disorder)   . Panic disorder   . Restrictive lung disease   . Vitamin B12 deficiency   . Vitamin D deficiency disease     Patient Active Problem List   Diagnosis Date Noted  . Grief 11/11/2017  . Lithium toxicity 11/10/2017  . Facial droop 10/17/2017  . Complicated grief 62/83/1517  . CKD (chronic kidney disease) stage 1, GFR 90 ml/min or greater 06/07/2017  . Panic attacks 08/13/2015  . Insomnia 08/13/2015  . Peripheral neuropathy 05/08/2015  . Osteoarthritis of both knees 12/25/2014  . OCD (obsessive compulsive disorder) 12/25/2014  . Encounter for lithium monitoring 12/25/2014  . Dyslipidemia 12/25/2014  . Kidney lesion 12/25/2014  . Fatty liver 12/25/2014  . Morbid obesity ([place])   . Menopausal state   . Benign hypertensive  renal disease   . IFG (impaired fasting glucose)   . Vitamin D deficiency disease   . Vitamin B12 deficiency   . Depression   . Anxiety   . Restrictive lung disease   . Hypertension, benign 03/17/2012    Past Surgical History:  Procedure Laterality Date  . CESAREAN SECTION    . DILATION AND CURETTAGE OF UTERUS    . TONSILLECTOMY      Prior to Admission medications   Medication Sig Start Date End Date Taking? Authorizing Provider  albuterol (PROVENTIL HFA;VENTOLIN HFA) 108 (90 Base) MCG/ACT inhaler Inhale 2 puffs into the lungs every 4 (four) hours as needed for wheezing or shortness of breath. 06/07/17   Park Liter P, DO  aspirin EC 81 MG tablet Take 81 mg by mouth daily.    [provider]  benazepril (LOTENSIN) 20 MG tablet Take 1 tablet (20 mg total) by mouth daily. 06/07/17   Johnson, Megan P, DO  bisacodyl (DULCOLAX) 5 MG EC tablet Take 5 mg by mouth daily as needed for moderate constipation.    [provider]  etodolac (LODINE) 400 MG tablet Take 1 tablet (400 mg total) by mouth 2 (two) times daily as needed. 08/08/17   Johnson, Megan P, DO  fluticasone (FLONASE) 50 MCG/ACT nasal spray Place 2 sprays into both nostrils daily. Reported on 05/08/2015 06/07/17   Park Liter P, DO  nystatin (MYCOSTATIN/NYSTOP) powder Apply topically 4 (four) times daily. 09/20/17  Johnson, Megan P, DO  omeprazole (PRILOSEC) 20 MG capsule Take 1 capsule (20 mg total) by mouth daily. 06/07/17   Johnson, Megan P, DO  polyethylene glycol (MIRALAX / GLYCOLAX) packet Take 17 g by mouth 3 (three) times daily as needed for moderate constipation. 11/18/17   Johnson, Megan P, DO  QUEtiapine (SEROQUEL) 25 MG tablet Take 25 mg nightly and 1/2 tab during the day for PRN anxiety. 12/06/17   Trinna Post, PA-C    Allergies Codeine; Dynacirc [isradipine]; Guaifenesin & derivatives; Sudafed [pseudoephedrine]; Zithromax [azithromycin]; and Prednisone  Family History  Problem Relation Age of  Onset  . Heart disease Mother   . Heart attack Mother   . Hypertension Mother   . Cancer Father        GI tract  . Hypertension Father   . Cancer Maternal Grandmother        gallbladder  . Heart disease Maternal Grandfather   . COPD Neg Hx   . Diabetes Neg Hx   . Stroke Neg Hx     Social History Social History   Tobacco Use  . Smoking status: Former Smoker    Types: Cigarettes    Last attempt to quit: 04/27/1987    Years since quitting: 30.6  . Smokeless tobacco: Never Used  Substance Use Topics  . Alcohol use: No  . Drug use: No    Review of Systems  Constitutional: Negative for fever. Eyes: Negative for visual changes. ENT: Negative for sore throat. Neck: No neck pain  Cardiovascular: Negative for chest pain. Respiratory: Negative for shortness of breath. Gastrointestinal: Negative for abdominal pain, vomiting or diarrhea. + constipation and rectal pressure Genitourinary: Negative for dysuria. Musculoskeletal: Negative for back pain. Skin: Negative for rash. Neurological: Negative for headaches, weakness or numbness. Psych: No SI or HI  ____________________________________________   PHYSICAL EXAM:  VITAL SIGNS: ED Triage Vitals  Enc Vitals Group     BP 12/07/17 1859 (!) 154/90     Pulse Rate 12/07/17 1859 83     Resp 12/07/17 1859 18     Temp 12/07/17 1859 98.7 F (37.1 C)     Temp Source 12/07/17 1859 Oral     SpO2 12/07/17 1859 95 %     Weight 12/07/17 1900 208 lb (94.3 kg)     Height 12/07/17 1900 5\' 2"  (1.575 m)     Head Circumference --      Peak Flow --      Pain Score 12/07/17 1859 5     Pain Loc --      Pain Edu? --      Excl. in [place]? --     Constitutional: Alert and oriented. Well appearing and in no apparent distress. HEENT:      Head: Normocephalic and atraumatic.         Eyes: Conjunctivae are normal. Sclera is non-icteric.       Mouth/Throat: Mucous membranes are moist.       Neck: Supple with no signs of  meningismus. Cardiovascular: Regular rate and rhythm. No murmurs, gallops, or rubs. 2+ symmetrical distal pulses are present in all extremities. No JVD. Respiratory: Normal respiratory effort. Lungs are clear to auscultation bilaterally. No wheezes, crackles, or rhonchi.  Gastrointestinal: Soft, non tender, and non distended with positive bowel sounds. No rebound or guarding. Genitourinary: large stool impaction Musculoskeletal: Nontender with normal range of motion in all extremities. No edema, cyanosis, or erythema of extremities. Neurologic: Normal speech and language. Face is symmetric. Moving  all extremities. No gross focal neurologic deficits are appreciated. Skin: Skin is warm, dry and intact. No rash noted. Psychiatric: Mood and affect are normal. Speech and behavior are normal.  ____________________________________________   LABS (all labs ordered are listed, but only abnormal results are displayed)  Labs Reviewed  COMPREHENSIVE METABOLIC PANEL - Abnormal; Notable for the following components:      Result Value   Glucose, Bld 105 (*)    All other components within normal limits  URINALYSIS, COMPLETE (UACMP) WITH MICROSCOPIC - Abnormal; Notable for the following components:   Color, Urine STRAW (*)    APPearance CLEAR (*)    Leukocytes, UA TRACE (*)    All other components within normal limits  CBC  LIPASE, BLOOD   ____________________________________________  EKG  none  ____________________________________________  RADIOLOGY  I have personally reviewed the images performed during this visit and I agree with the Radiologist's read.   Interpretation by Radiologist:  Dg Abd 2 Views  Result Date: 12/07/2017 CLINICAL DATA:  Constipation. EXAM: ABDOMEN - 2 VIEW COMPARISON:  CT abdomen 07/01/2014 FINDINGS: Multiple air-fluid levels are identified within the abdomen. No dilated small bowel loops identified. A large desiccated stool ball is identified within the rectum  measuring 8.6 cm in diameter. No free air identified. IMPRESSION: 1. Large stool ball within the rectum which may reflect constipation or impaction. 2. Multiple air-fluid levels are identified on the upright images. If there is a clinical concern for bowel obstruction consider further of evaluation with CT of the abdomen pelvis. Electronically Signed   By: Kerby Moors M.D.   On: 12/07/2017 21:44      ____________________________________________   PROCEDURES  Procedure(s) performed: yes Procedures   ------------------------------------------------------------------------------------------------------------------- Fecal Disimpaction Procedure Note:  Performed by me:  Patient placed in the lateral recumbent position with knees drawn towards chest. Nurse present for patient support. Large amount of hard brown stool removed. No complications during procedure.   ------------------------------------------------------------------------------------------------------------------   Critical Care performed:  None ____________________________________________   INITIAL IMPRESSION / ASSESSMENT AND PLAN / ED COURSE  62 y.o. female with history as listed below including intermittent constipation who presents for evaluation of constipation x 3 days and rectal pressure.  Patient has no abdominal pain or tenderness, vitals are within normal limits.  Labs including CBC, CMP and lipase are within normal limits.  UA is pending.  KUB showed rectal vault impaction. Rectal exam showed large stool impacted which was disimpacted per procedure note above.  Patient had immediate relief.  We will give a enema.  Patient has no clinical signs of an SBO.    _________________________ 11:05 PM on 12/07/2017 -----------------------------------------  After a soapsuds enema patient had a large bowel movement.  Remains well-appearing with no abdominal pain, tolerating p.o. with no nausea or vomiting.  Will discharge  home on senna, Colace, MiraLAX.  Discussed return precautions and follow-up with PCP.   As part of my medical decision making, I reviewed the following data within the [place] notes reviewed and incorporated, Labs reviewed , Old chart reviewed, Radiograph reviewed , Notes from prior ED visits and [place] Controlled Substance Database    Pertinent labs & imaging results that were available during my care of the patient were reviewed by me and considered in my medical decision making (see chart for details).    ____________________________________________   FINAL CLINICAL IMPRESSION(S) / ED DIAGNOSES  Final diagnoses:  Constipation      NEW MEDICATIONS STARTED DURING THIS VISIT:  ED Discharge Orders    None       Note:  This document was prepared using Dragon voice recognition software and may include unintentional dictation errors.    Rudene Re, MD 12/07/17 [phone]

## 2017-12-07 NOTE — Discharge Instructions (Signed)
Constipation: Take colace twice a day everyday. Take senna once a day at bedtime. Take daily probiotics. Drink plenty of fluids and eat a diet rich in fiber. If you go more than 3 days without a bowel movement, take 1 cap full of Miralax in the morning and one in the evening up to 5 days.  ° °

## 2017-12-07 NOTE — ED Notes (Signed)
FIRST NURSE NOTE:  Pt c/o constipation, states she had normal BM on Sunday, but feels like there is still stool in rectum. Pt states she drank bottle of mag citrate without relief.

## 2017-12-07 NOTE — Telephone Encounter (Signed)
Message relayed to patient. Verbalized understanding and denied questions.   

## 2017-12-07 NOTE — Telephone Encounter (Signed)
Please have her really increase her fluid intake. Start taking miralax 3x a day. If she is not better with that, she will likely need to be seen.

## 2017-12-07 NOTE — Telephone Encounter (Signed)
Pt called with complaints of constipation; she was seen in the office on 12/06/17; the pt said that it started a couple of weeks ago; the pt says that she has been taken stool softeners and miralax with little relief; the pt says that she does not take the miralax 3 times daily as needed; the pt says that "this morning she can't go"; she says that she has given herself an enema but has gotten no relief; she also says that her abdomen is swollen and she has to sit on one check because "it's right there"; she also noted bright red blood when she wiped yesterday; the pt was seen in the office on 12/06/17 but she forgot to mention it; spoke with Sharalyn Ink at [facility], and relayed information from the pt; she states that the provider is aware and that the pt will get a return call; informed the pt and she verbalizes understanding; her contact number is [phone] and a detailed message can be left; will also route this encounter to the office for notification.  Reason for Disposition . Abdomen is more swollen than usual  Answer Assessment - Initial Assessment Questions 1. STOOL PATTERN OR FREQUENCY: "How often do you pass bowel movements (BMs)?"  (Normal range: tid to q 3 days)  "When was the last BM passed?"       Every other day; last 12/04/12 but "felt like something was still there" 2. STRAINING: "Do you have to strain to have a BM?"    yes 3. RECTAL PAIN: "Does your rectum hurt when the stool comes out?" If so, ask: "Do you have hemorrhoids? How bad is the pain?"  (Scale 1-10; or mild, moderate, severe)     No hemorrhoids; pain rated mild to moderate 4. STOOL COMPOSITION: "Are the stools hard?"      yes 5. BLOOD ON STOOLS: "Has there been any blood on the toilet tissue or on the surface of the BM?" If so, ask: "When was the last time?"      Yes 12/06/17 6. CHRONIC CONSTIPATION: "Is this a new problem for you?"  If no, ask: "How long have you had this problem?" (days, weeks, months)      weeks 7. CHANGES  IN DIET: "Have there been any recent changes in your diet?"      Yes; went from regular foods to eating salads and meats 8. MEDICATIONS: "Have you been taking any new medications?"     Yes; seroquel 9. LAXATIVES: "Have you been using any laxatives or enemas?"  If yes, ask "What, how often, and when was the last time?"     Both; enema 12/06/17; miralax 12/06/17 at 2200 10. CAUSE: "What do you think is causing the constipation?"       unsure 11. OTHER SYMPTOMS: "Do you have any other symptoms?" (e.g., abdominal pain, fever, vomiting)       Pressure in stomach and rectal area 12. PREGNANCY: "Is there any chance you are pregnant?" "When was your last menstrual period?"       no  Protocols used: CONSTIPATION-A-AH

## 2017-12-07 NOTE — Patient Instructions (Signed)
Depression Screening Depression screening is a tool that your health care provider can use to learn if you have symptoms of depression. Depression is a common condition with many symptoms that are also often found in other conditions. Depression is treatable, but it must first be diagnosed. You may not know that certain feelings, thoughts, and behaviors that you are having can be symptoms of depression. Taking a depression screening test can help you and your health care provider decide if you need more assessment, or if you should be referred to a mental health care provider. What are the screening tests?  You may have a physical exam to see if another condition is affecting your mental health. You may have a blood or urine sample taken during the physical exam.  You may be interviewed using a screening tool that was developed from research, such as one of these: ? Patient Health Questionnaire (PHQ). This is a set of either 2 or 9 questions. A health care provider who has been trained to score this screening test uses a guide to assess if your symptoms suggest that you may have depression. ? Hamilton Depression Rating Scale (HAM-D). This is a set of either 17 or 24 questions. You may be asked to take it again during or after your treatment, to see if your depression has gotten better. ? Beck Depression Inventory (BDI). This is a set of 21 multiple choice questions. Your health care provider scores your answers to assess:  Your level of depression, ranging from mild to severe.  Your response to treatment.  Your health care provider may talk with you about your daily activities, such as eating, sleeping, work, and recreation, and ask if you have had any changes in activity.  Your health care provider may ask you to see a mental health specialist, such as a psychiatrist or psychologist, for more evaluation. Who should be screened for depression?  All adults, including adults with a family history  of a mental health disorder.  Adolescents who are 12-18 years old.  People who are recovering from a myocardial infarction (MI).  Pregnant women, or women who have given birth.  People who have a long-term (chronic) illness.  Anyone who has been diagnosed with another type of a mental health disorder.  Anyone who has symptoms that could show depression. What do my results mean? Your health care provider will review the results of your depression screening, physical exam, and lab tests. Positive screens suggest that you may have depression. Screening is the first step in getting the care that you may need. It is up to you to get your screening results. Ask your health care provider, or the department that is doing your screening tests, when your results will be ready. Talk with your health care provider about your results and diagnosis. A diagnosis of depression is made using the Diagnostic and Statistical Manual of Mental Disorders (DSM-V). This is a book that lists the number and type of symptoms that must be present for a health care provider to give a specific diagnosis.  Your health care provider may work with you to treat your symptoms of depression, or your health care provider may help you find a mental health provider who can assess, diagnose, and treat your depression. Get help right away if:  You have thoughts about hurting yourself or others. If you ever feel like you may hurt yourself or others, or have thoughts about taking your own life, get help right away. You can   go to your nearest emergency department or call:  Your local emergency services (911 in the [place]  A suicide crisis helpline, such as the National Suicide Prevention Lifeline at 1-800-273-8255. This is open 24 hours a day.  Summary  Depression screening is the first step in getting the help that you may need.  If your screening test shows symptoms of depression (is positive), your health care provider may ask  you to see a mental health provider.  Anyone who is age 12 or older should be screened for depression. This information is not intended to replace advice given to you by your health care provider. Make sure you discuss any questions you have with your health care provider. Document Released: 08/27/2016 Document Revised: 08/27/2016 Document Reviewed: 08/27/2016 Elsevier Interactive Patient Education  2018 [employer]vier Inc.  

## 2017-12-07 NOTE — ED Triage Notes (Signed)
Pt presents with constipation; hx of same. She states she has hx of same, but today she feels like there is an impaction. Pt states she had bm on Sunday, felt like there was retention, and has not been able to go since then. Pt took mag citrate today with no relief. NAD noted.

## 2017-12-07 NOTE — Telephone Encounter (Signed)
Copied from [facility] [phone]. Topic: General - Other >> Dec 07, 2017  8:16 AM Keene Breath wrote: Reason for CRM: Patient called to speak with nurse or doctor regarding severed constipation she is having.  Patient states she has tried different remedies, Mi rolax , but it has not helped.  Asked if she wanted to speak with NT and she did agree.  Patient stated she still would like a call from the doctor or nurse.  CB# [phone].

## 2017-12-07 NOTE — ED Notes (Signed)
Lab results reviewed, awaiting urine sample.

## 2017-12-08 ENCOUNTER — Ambulatory Visit: Payer: Self-pay | Admitting: *Deleted

## 2017-12-08 NOTE — Telephone Encounter (Signed)
Patient stumped left pinky toe on brick fireplace hearth 3 1/2 days ago.Stated the area between the 2 last toes was bruised. The toes and part of the top of her foot is also red and warm to touch. Reports she has swelling in the foot today. She is able to walk and wiggle the toes as usual. Stated she has neuropathy (not from diabetes) and does not feel pain. Advice care reviewed including elevate and ice (with precautions discussed). Call back tomorrow if redness or swelling increases.   Reason for Disposition . Toe swelling, bruise or pain  Answer Assessment - Initial Assessment Questions 1. MECHANISM: "How did the injury happen?"     Hit Left pinky toe on brick fireplace hearth. 2. ONSET: "When did the injury happen?" (Minutes or hours ago)     Sunday 3. LOCATION: "What part of the toe is injured?" "Is the nail damaged?"      Entire toe. Nail okay 4. APPEARANCE of TOE INJURY: "What does the injury look like?"      Bluish in between pinky toe and the one beside it. All toes and part of the top of foot is now red. Warm to touch 5. SEVERITY: "Can you use the foot normally?" "Can you walk?"      yes 6. SIZE: For cuts, bruises, or swelling, ask: "How large is it?" (e.g., inches or centimeters;  entire toe)      none 7. PAIN: "Is there pain?" If so, ask: "How bad is the pain?"   (e.g., Scale 1-10; or mild, moderate, severe)     She has neuropathy and doesn't feel pain. 8. TETANUS: For any breaks in the skin, ask: "When was the last tetanus booster?"     No breaks. 9. DIABETES: "Do you have a history of diabetes or poor circulation in the feet?"     Does not have diabetes but has neuropathy in feet. 10. OTHER SYMPTOMS: "Do you have any other symptoms?"        no 11. PREGNANCY: "Is there any chance you are pregnant?" "When was your last menstrual period?"       no  Protocols used: TOE INJURY-A-AH

## 2017-12-13 ENCOUNTER — Ambulatory Visit
Admission: RE | Admit: 2017-12-13 | Discharge: 2017-12-13 | Disposition: A | Discharge status: Home / Self Care | Payer: Self-pay | Source: Ambulatory Visit | Attending: Physician Assistant | Admitting: Physician Assistant

## 2017-12-13 ENCOUNTER — Encounter: Payer: Self-pay | Admitting: Physician Assistant

## 2017-12-13 ENCOUNTER — Ambulatory Visit (INDEPENDENT_AMBULATORY_CARE_PROVIDER_SITE_OTHER): Payer: Self-pay | Admitting: Physician Assistant

## 2017-12-13 ENCOUNTER — Other Ambulatory Visit: Payer: Self-pay

## 2017-12-13 VITALS — BP 141/72 | HR 71 | Temp 99.0°F | Ht 62.0 in | Wt 209.0 lb

## 2017-12-13 DIAGNOSIS — M79672 Pain in left foot: Secondary | ICD-10-CM

## 2017-12-13 DIAGNOSIS — W208XXA Other cause of strike by thrown, projected or falling object, initial encounter: Secondary | ICD-10-CM | POA: Insufficient documentation

## 2017-12-13 DIAGNOSIS — S92512A Displaced fracture of proximal phalanx of left lesser toe(s), initial encounter for closed fracture: Secondary | ICD-10-CM | POA: Insufficient documentation

## 2017-12-13 NOTE — Patient Instructions (Addendum)
[place] [facility] [address]     Foot Pain Many things can cause foot pain. Some common causes are:  An injury.  A sprain.  Arthritis.  Blisters.  Bunions.  Follow these instructions at home: Pay attention to any changes in your symptoms. Take these actions to help with your discomfort:  If directed, put ice on the affected area: ? Put ice in a plastic bag. ? Place a towel between your skin and the bag. ? Leave the ice on for 15-20 minutes, 3?4 times a day for 2 days.  Take over-the-counter and prescription medicines only as told by your health care provider.  Wear comfortable, supportive shoes that fit you well. Do not wear high heels.  Do not stand or walk for long periods of time.  Do not lift a lot of weight. This can put added pressure on your feet.  Do stretches to relieve foot pain and stiffness as told by your health care provider.  Rub your foot gently.  Keep your feet clean and dry.  Contact a health care provider if:  Your pain does not get better after a few days of self-care.  Your pain gets worse.  You cannot stand on your foot. Get help right away if:  Your foot is numb or tingling.  Your foot or toes are swollen.  Your foot or toes turn white or blue.  You have warmth and redness along your foot. This information is not intended to replace advice given to you by your health care provider. Make sure you discuss any questions you have with your health care provider. Document Released: 05/09/2015 Document Revised: 09/18/2015 Document Reviewed: 05/08/2014 Elsevier Interactive Patient Education  [employer].

## 2017-12-13 NOTE — Progress Notes (Signed)
Subjective:    Patient ID: Jaime Freeman, female    DOB: 30-Sep-1955, 62 y.o.   MRN: [id number]  Jaime Freeman is a 62 y.o. female presenting on 12/13/2017 for Foot Pain (left foot x 1.5 week/ pt states she tripped herself in a brik, swelling and red)   HPI   Dropped plastic jar of applesauce on top of left foot several days ago. Edema and redness after this but has mostly resolved. Area is tender but she has been able to walk.   Social History   Tobacco Use  . Smoking status: Former Smoker    Types: Cigarettes    Last attempt to quit: 04/27/1987    Years since quitting: 30.6  . Smokeless tobacco: Never Used  Substance Use Topics  . Alcohol use: No  . Drug use: No    Review of Systems Per HPI unless specifically indicated above     Objective:    BP (!) 141/72   Pulse 71   Temp 99 F (37.2 C) (Oral)   Ht 5\' 2"  (1.575 m)   Wt 209 lb (94.8 kg)   SpO2 100%   BMI 38.23 kg/m   Wt Readings from Last 3 Encounters:  12/13/17 209 lb (94.8 kg)  12/07/17 208 lb (94.3 kg)  12/06/17 208 lb 6.4 oz (94.5 kg)    Physical Exam  Constitutional: She is oriented to person, place, and time.  Musculoskeletal: Normal range of motion. She exhibits edema. She exhibits no tenderness or deformity.  There is some erythema and swelling of left pinky toe but no tenderness to palpation. Dorsum of left foot is slightly edematous but no bruising or redness. No tenderness to palpation.   Neurological: She is alert and oriented to person, place, and time.  Skin: Skin is warm and dry.  Psychiatric: She has a normal mood and affect. Her behavior is normal.   Results for orders placed or performed during the hospital encounter of 12/07/17  Comprehensive metabolic panel  Result Value Ref Range   Sodium 139 135 - 145 mmol/L   Potassium 4.8 3.5 - 5.1 mmol/L   Chloride 108 98 - 111 mmol/L   CO2 24 22 - 32 mmol/L   Glucose, Bld 105 (H) 70 - 99 mg/dL   BUN 14 8 - 23 mg/dL   Creatinine, Ser 0.74 0.44  - 1.00 mg/dL   Calcium 9.8 8.9 - 10.3 mg/dL   Total Protein 7.2 6.5 - 8.1 g/dL   Albumin 4.1 3.5 - 5.0 g/dL   AST 21 15 - 41 U/L   ALT 13 0 - 44 U/L   Alkaline Phosphatase 55 38 - 126 U/L   Total Bilirubin 0.7 0.3 - 1.2 mg/dL   GFR calc non Af Amer >60 >60 mL/min   GFR calc Af Amer >60 >60 mL/min   Anion gap 7 5 - 15  CBC  Result Value Ref Range   WBC 9.8 3.6 - 11.0 K/uL   RBC 3.90 3.80 - 5.20 MIL/uL   Hemoglobin 12.6 12.0 - 16.0 g/dL   HCT 37.3 35.0 - 47.0 %   MCV 95.7 80.0 - 100.0 fL   MCH 32.4 26.0 - 34.0 pg   MCHC 33.9 32.0 - 36.0 g/dL   RDW 12.7 11.5 - 14.5 %   Platelets 338 150 - 440 K/uL  Urinalysis, Complete w Microscopic  Result Value Ref Range   Color, Urine STRAW (A) YELLOW   APPearance CLEAR (A) CLEAR   Specific Gravity,  Urine 1.009 1.005 - 1.030   pH 6.0 5.0 - 8.0   Glucose, UA NEGATIVE NEGATIVE mg/dL   Hgb urine dipstick NEGATIVE NEGATIVE   Bilirubin Urine NEGATIVE NEGATIVE   Ketones, ur NEGATIVE NEGATIVE mg/dL   Protein, ur NEGATIVE NEGATIVE mg/dL   Nitrite NEGATIVE NEGATIVE   Leukocytes, UA TRACE (A) NEGATIVE   RBC / HPF 0-5 0 - 5 RBC/hpf   WBC, UA 6-10 0 - 5 WBC/hpf   Bacteria, UA NONE SEEN NONE SEEN   Squamous Epithelial / LPF 0-5 0 - 5  Lipase, blood  Result Value Ref Range   Lipase 40 11 - 51 U/L      Assessment & Plan:  1. Left foot pain  - DG Foot Complete Left; Future   Follow up plan: Return if symptoms worsen or fail to improve.  Carles Collet, PA-C [facility] 12/13/2017, 4:52 PM

## 2017-12-22 ENCOUNTER — Ambulatory Visit (INDEPENDENT_AMBULATORY_CARE_PROVIDER_SITE_OTHER): Payer: Self-pay | Admitting: Family Medicine

## 2017-12-22 ENCOUNTER — Encounter: Payer: Self-pay | Admitting: Family Medicine

## 2017-12-22 VITALS — BP 137/75 | HR 65 | Wt 210.1 lb

## 2017-12-22 DIAGNOSIS — R7301 Impaired fasting glucose: Secondary | ICD-10-CM

## 2017-12-22 DIAGNOSIS — E559 Vitamin D deficiency, unspecified: Secondary | ICD-10-CM

## 2017-12-22 DIAGNOSIS — J984 Other disorders of lung: Secondary | ICD-10-CM

## 2017-12-22 DIAGNOSIS — E538 Deficiency of other specified B group vitamins: Secondary | ICD-10-CM

## 2017-12-22 DIAGNOSIS — N181 Chronic kidney disease, stage 1: Secondary | ICD-10-CM

## 2017-12-22 DIAGNOSIS — K760 Fatty (change of) liver, not elsewhere classified: Secondary | ICD-10-CM

## 2017-12-22 DIAGNOSIS — F332 Major depressive disorder, recurrent severe without psychotic features: Secondary | ICD-10-CM

## 2017-12-22 DIAGNOSIS — I10 Essential (primary) hypertension: Secondary | ICD-10-CM

## 2017-12-22 DIAGNOSIS — I129 Hypertensive chronic kidney disease with stage 1 through stage 4 chronic kidney disease, or unspecified chronic kidney disease: Secondary | ICD-10-CM

## 2017-12-22 DIAGNOSIS — E785 Hyperlipidemia, unspecified: Secondary | ICD-10-CM

## 2017-12-22 LAB — BAYER DCA HB A1C WAIVED: HB A1C: 5.9 % (ref <7.0)

## 2017-12-22 MED ORDER — QUETIAPINE FUMARATE 25 MG PO TABS: ORAL_TABLET | ORAL | 1 refills | Status: DC

## 2017-12-22 MED ORDER — OMEPRAZOLE 20 MG PO CPDR: 20.0000 mg | DELAYED_RELEASE_CAPSULE | Freq: Every day | ORAL | 1 refills | Status: DC

## 2017-12-22 MED ORDER — ETODOLAC 400 MG PO TABS: 400.0000 mg | ORAL_TABLET | Freq: Two times a day (BID) | ORAL | 3 refills | Status: DC | PRN

## 2017-12-22 MED ORDER — BENAZEPRIL HCL 20 MG PO TABS: 20.0000 mg | ORAL_TABLET | Freq: Every day | ORAL | 1 refills | Status: DC

## 2017-12-22 NOTE — Assessment & Plan Note (Signed)
Lungs clear. Continue to monitor. Call with any concerns.

## 2017-12-22 NOTE — Assessment & Plan Note (Signed)
Under good control on current regimen. Continue current regimen. Continue to monitor. Call with any concerns. Refills given.   

## 2017-12-22 NOTE — Assessment & Plan Note (Signed)
Rechecking levels today. Call with any concerns. Continue to monitor. Await results.

## 2017-12-22 NOTE — Progress Notes (Signed)
BP 137/75 (BP Location: Left Arm, Patient Position: Sitting, Cuff Size: Large)   Pulse 65   Wt 210 lb 2 oz (95.3 kg)   SpO2 100%   BMI 38.43 kg/m    Subjective:    Patient ID: Jaime Freeman, female    DOB: 05-Oct-1955, 62 y.o.   MRN: [id number]  HPI: Jaime Freeman is a 62 y.o. female  Chief Complaint  Patient presents with  . Hypertension  . Hyperlipidemia  . Depression  . IFG   HYPERTENSION / HYPERLIPIDEMIA Satisfied with current treatment? yes Duration of hypertension: chronic BP monitoring frequency: not checking BP medication side effects: no Past BP meds: benazepril Duration of hyperlipidemia: chronic Cholesterol medication side effects: yes- myalgias Cholesterol supplements: none Medication compliance: excellent compliance Aspirin: no Recent stressors: yes Recurrent headaches: no Visual changes: no Palpitations: no Dyspnea: no Chest pain: no Lower extremity edema: no Dizzy/lightheaded: no  Impaired Fasting Glucose HbA1C:  Lab Results  Component Value Date   HGBA1C 6.4 (H) 12/25/2014   Duration of elevated blood sugar:  Polydipsia: no Polyuria: no Weight change: yes Visual disturbance: yes Glucose Monitoring: yes Diabetic Education: Completed Family history of diabetes: yes  DEPRESSION Mood status: better Satisfied with current treatment?: no Symptom severity: mild   Duration of current treatment : months Side effects: no Medication compliance: excellent compliance Psychotherapy/counseling: yes  Depressed mood: no Anxious mood: no Anhedonia: no Significant weight loss or gain: no Insomnia: no  Fatigue: no Feelings of worthlessness or guilt: no Impaired concentration/indecisiveness: no Suicidal ideations: no Hopelessness: no Crying spells: no Depression screen [facility] 2/9 12/13/2017 12/06/2017 11/04/2017 10/17/2017 09/20/2017  Decreased Interest 0 0 3 3 2   Down, Depressed, Hopeless 0 0 3 3 3   PHQ - 2 Score 0 0 6 6 5   Altered sleeping 0 0 3  2 1   Tired, decreased energy 0 0 3 3 3   Change in appetite 0 0 3 3 1   Feeling bad or failure about yourself  0 0 3 0 1  Trouble concentrating 0 0 3 3 3   Moving slowly or fidgety/restless 0 0 3 3 0  Suicidal thoughts 0 0 0 0 0  PHQ-9 Score 0 0 24 20 14   Difficult doing work/chores - - Extremely dIfficult Very difficult -    Relevant past medical, surgical, family and social history reviewed and updated as indicated. Interim medical history since our last visit reviewed. Allergies and medications reviewed and updated.  Review of Systems  Constitutional: Negative.   Respiratory: Negative.   Cardiovascular: Negative.   Gastrointestinal: Negative.   Psychiatric/Behavioral: Negative.     Per HPI unless specifically indicated above     Objective:    BP 137/75 (BP Location: Left Arm, Patient Position: Sitting, Cuff Size: Large)   Pulse 65   Wt 210 lb 2 oz (95.3 kg)   SpO2 100%   BMI 38.43 kg/m   Wt Readings from Last 3 Encounters:  12/22/17 210 lb 2 oz (95.3 kg)  12/13/17 209 lb (94.8 kg)  12/07/17 208 lb (94.3 kg)    Physical Exam  Constitutional: She is oriented to person, place, and time. She appears well-developed and well-nourished. No distress.  HENT:  Head: Normocephalic and atraumatic.  Right Ear: Hearing normal.  Left Ear: Hearing normal.  Nose: Nose normal.  Eyes: Conjunctivae and lids are normal. Right eye exhibits no discharge. Left eye exhibits no discharge. No scleral icterus.  Cardiovascular: Normal rate, regular rhythm, normal heart sounds and intact distal  pulses. Exam reveals no gallop and no friction rub.  No murmur heard. Pulmonary/Chest: Effort normal and breath sounds normal. No stridor. No respiratory distress. She has no wheezes. She has no rales. She exhibits no tenderness.  Musculoskeletal: Normal range of motion.  Neurological: She is alert and oriented to person, place, and time.  Skin: Skin is warm, dry and intact. Capillary refill takes less  than 2 seconds. No rash noted. She is not diaphoretic. No erythema. No pallor.  Psychiatric: She has a normal mood and affect. Her speech is normal and behavior is normal. Judgment and thought content normal. Cognition and memory are normal.  Nursing note and vitals reviewed.   Results for orders placed or performed during the hospital encounter of 12/07/17  Comprehensive metabolic panel  Result Value Ref Range   Sodium 139 135 - 145 mmol/L   Potassium 4.8 3.5 - 5.1 mmol/L   Chloride 108 98 - 111 mmol/L   CO2 24 22 - 32 mmol/L   Glucose, Bld 105 (H) 70 - 99 mg/dL   BUN 14 8 - 23 mg/dL   Creatinine, Ser 0.74 0.44 - 1.00 mg/dL   Calcium 9.8 8.9 - 10.3 mg/dL   Total Protein 7.2 6.5 - 8.1 g/dL   Albumin 4.1 3.5 - 5.0 g/dL   AST 21 15 - 41 U/L   ALT 13 0 - 44 U/L   Alkaline Phosphatase 55 38 - 126 U/L   Total Bilirubin 0.7 0.3 - 1.2 mg/dL   GFR calc non Af Amer >60 >60 mL/min   GFR calc Af Amer >60 >60 mL/min   Anion gap 7 5 - 15  CBC  Result Value Ref Range   WBC 9.8 3.6 - 11.0 K/uL   RBC 3.90 3.80 - 5.20 MIL/uL   Hemoglobin 12.6 12.0 - 16.0 g/dL   HCT 37.3 35.0 - 47.0 %   MCV 95.7 80.0 - 100.0 fL   MCH 32.4 26.0 - 34.0 pg   MCHC 33.9 32.0 - 36.0 g/dL   RDW 12.7 11.5 - 14.5 %   Platelets 338 150 - 440 K/uL  Urinalysis, Complete w Microscopic  Result Value Ref Range   Color, Urine STRAW (A) YELLOW   APPearance CLEAR (A) CLEAR   Specific Gravity, Urine 1.009 1.005 - 1.030   pH 6.0 5.0 - 8.0   Glucose, UA NEGATIVE NEGATIVE mg/dL   Hgb urine dipstick NEGATIVE NEGATIVE   Bilirubin Urine NEGATIVE NEGATIVE   Ketones, ur NEGATIVE NEGATIVE mg/dL   Protein, ur NEGATIVE NEGATIVE mg/dL   Nitrite NEGATIVE NEGATIVE   Leukocytes, UA TRACE (A) NEGATIVE   RBC / HPF 0-5 0 - 5 RBC/hpf   WBC, UA 6-10 0 - 5 WBC/hpf   Bacteria, UA NONE SEEN NONE SEEN   Squamous Epithelial / LPF 0-5 0 - 5  Lipase, blood  Result Value Ref Range   Lipase 40 11 - 51 U/L      Assessment & Plan:   Problem  List Items Addressed This Visit      Cardiovascular and Mediastinum   Hypertension, benign - Primary    Under good control on current regimen. Continue current regimen. Continue to monitor. Call with any concerns. Refills given.        Relevant Medications   benazepril (LOTENSIN) 20 MG tablet   Other Relevant Orders   CBC with Differential/Platelet   Comprehensive metabolic panel     Respiratory   Restrictive lung disease    Lungs clear. Continue to  monitor. Call with any concerns.       Relevant Orders   CBC with Differential/Platelet   Comprehensive metabolic panel     Digestive   Fatty liver    Rechecking levels today. Call with any concerns. Continue to monitor. Await results.       Relevant Orders   CBC with Differential/Platelet   Comprehensive metabolic panel     Endocrine   IFG (impaired fasting glucose)   Relevant Orders   Bayer DCA Hb A1c Waived   CBC with Differential/Platelet   Comprehensive metabolic panel   Microalbumin, Urine Waived     Genitourinary   Benign hypertensive renal disease    Under good control on current regimen. Continue current regimen. Continue to monitor. Call with any concerns. Refills given.        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 Call with any concerns. Continue to monitor. Await results.       Relevant Orders   CBC with Differential/Platelet   Comprehensive metabolic panel   VITAMIN D 25 Hydroxy (Vit-D Deficiency, Fractures)   Vitamin B12 deficiency    Rechecking levels today. Call with any concerns. Continue to monitor. Await results.       Relevant Orders   CBC with Differential/Platelet    Comprehensive metabolic panel   A35 and Folate Panel   Depression    Under good control on current regimen. Continue current regimen. Continue to monitor. Call with any concerns. Refills given.        Relevant Medications   QUEtiapine (SEROQUEL) 25 MG tablet   Other Relevant Orders   CBC with Differential/Platelet   Comprehensive metabolic panel   Dyslipidemia    Rechecking levels today. Call with any concerns. Continue to monitor. Await results.       Relevant Orders   CBC with Differential/Platelet   Comprehensive metabolic panel   Lipid Panel w/o Chol/HDL Ratio       Follow up plan: Return in about 6 months (around 06/24/2018) for Physical.

## 2017-12-23 ENCOUNTER — Encounter: Payer: Self-pay | Admitting: Family Medicine

## 2017-12-23 LAB — CBC WITH DIFFERENTIAL/PLATELET
Basophils Absolute: 0 10*3/uL (ref 0.0–0.2)
Basos: 0 %
EOS (ABSOLUTE): 0.2 10*3/uL (ref 0.0–0.4)
EOS: 3 %
Hematocrit: 35.2 % (ref 34.0–46.6)
Hemoglobin: 11.5 g/dL (ref 11.1–15.9)
Immature Grans (Abs): 0 10*3/uL (ref 0.0–0.1)
Immature Granulocytes: 0 %
Lymphocytes Absolute: 2.1 10*3/uL (ref 0.7–3.1)
Lymphs: 31 %
MCH: 31 pg (ref 26.6–33.0)
MCHC: 32.7 g/dL (ref 31.5–35.7)
MCV: 95 fL (ref 79–97)
MONOS ABS: 0.4 10*3/uL (ref 0.1–0.9)
Monocytes: 7 %
Neutrophils Absolute: 4.1 10*3/uL (ref 1.4–7.0)
Neutrophils: 59 %
PLATELETS: 415 10*3/uL (ref 150–450)
RBC: 3.71 x10E6/uL — ABNORMAL LOW (ref 3.77–5.28)
RDW: 13.4 % (ref 12.3–15.4)
WBC: 6.8 10*3/uL (ref 3.4–10.8)

## 2017-12-23 LAB — COMPREHENSIVE METABOLIC PANEL
A/G RATIO: 1.8 (ref 1.2–2.2)
ALK PHOS: 55 IU/L (ref 39–117)
ALT: 6 IU/L (ref 0–32)
AST: 16 IU/L (ref 0–40)
Albumin: 3.9 g/dL (ref 3.6–4.8)
BILIRUBIN TOTAL: 0.2 mg/dL (ref 0.0–1.2)
BUN/Creatinine Ratio: 23 (ref 12–28)
BUN: 17 mg/dL (ref 8–27)
CALCIUM: 9.4 mg/dL (ref 8.7–10.3)
CHLORIDE: 106 mmol/L (ref 96–106)
CO2: 24 mmol/L (ref 20–29)
Creatinine, Ser: 0.74 mg/dL (ref 0.57–1.00)
GFR calc Af Amer: 101 mL/min/{1.73_m2} (ref >59)
GFR, EST NON AFRICAN AMERICAN: 88 mL/min/{1.73_m2} (ref >59)
Globulin, Total: 2.2 g/dL (ref 1.5–4.5)
Glucose: 85 mg/dL (ref 65–99)
Potassium: 5 mmol/L (ref 3.5–5.2)
Sodium: 143 mmol/L (ref 134–144)
Total Protein: 6.1 g/dL (ref 6.0–8.5)

## 2017-12-23 LAB — B12 AND FOLATE PANEL
Folate: 5.1 ng/mL (ref >3.0)
Vitamin B-12: 302 pg/mL (ref 232–1245)

## 2017-12-23 LAB — LIPID PANEL W/O CHOL/HDL RATIO
CHOLESTEROL TOTAL: 189 mg/dL (ref 100–199)
HDL: 55 mg/dL (ref >39)
LDL Calculated: 119 mg/dL — ABNORMAL HIGH (ref 0–99)
TRIGLYCERIDES: 75 mg/dL (ref 0–149)
VLDL Cholesterol Cal: 15 mg/dL (ref 5–40)

## 2017-12-23 LAB — VITAMIN D 25 HYDROXY (VIT D DEFICIENCY, FRACTURES): Vit D, 25-Hydroxy: 28.8 ng/mL — ABNORMAL LOW (ref 30.0–100.0)

## 2018-02-09 ENCOUNTER — Telehealth: Payer: Self-pay | Admitting: Family Medicine

## 2018-02-09 MED ORDER — AMOXICILLIN-POT CLAVULANATE 875-125 MG PO TABS: 1.0000 | ORAL_TABLET | Freq: Two times a day (BID) | ORAL | 0 refills | Status: DC

## 2018-02-09 NOTE — Telephone Encounter (Signed)
Message relayed to patient. Verbalized understanding and denied questions.   

## 2018-02-09 NOTE — Telephone Encounter (Signed)
Rx sent to her pharmacy. She should follow up with the dentist as soon as she can.

## 2018-02-09 NOTE — Telephone Encounter (Signed)
Copied from [place] [id number]. Topic: General - Other >> Feb 09, 2018  4:06 PM Yvette Rack wrote: Reason for CRM:  Pt states she has a tooth infection and she would like Dr. Wynetta Emery to prescribed her an antibiotic until she can get to the dentist. Pt requests call back to advise.

## 2018-02-22 ENCOUNTER — Telehealth: Payer: Self-pay | Admitting: Family Medicine

## 2018-02-22 NOTE — Telephone Encounter (Signed)
She will need to be seen

## 2018-02-22 NOTE — Telephone Encounter (Signed)
Please call and schedule appointment per Dr. Wynetta Emery. Thanks.

## 2018-02-22 NOTE — Telephone Encounter (Signed)
Patient is requesting to be able to bring in a urine sample instead of office visit due to the fact of her low immune system.  Please advise.    Thank you

## 2018-02-22 NOTE — Telephone Encounter (Signed)
Copied from [place] [id number]. Topic: Quick Communication - Rx Refill/Question >> Feb 22, 2018 10:58 AM Marin Olp L wrote: Medication: script for burning, and frequency with urination, itchy and swelling (feels it's a UTI caused by amoxicillin called in on 02/09/2018 and says Dr. Wynetta Emery told her not to come in for an appointment for health reasons)  Has the patient contacted their pharmacy? No. (Agent: If no, request that the patient contact the pharmacy for the refill.) (Agent: If yes, when and what did the pharmacy advise?)  Preferred Pharmacy (with phone number or street name): [facility] [address] [place] [address] Phone: [phone] Fax: [phone] (closes at [place])  Agent: Please be advised that RX refills may take up to 3 business days. We ask that you follow-up with your pharmacy.

## 2018-02-23 NOTE — Telephone Encounter (Signed)
We are not in flu season yet, I'd really like to see her.

## 2018-02-23 NOTE — Telephone Encounter (Signed)
Pt called requesting to speak with a nurse about urine sample, advised her of Dr. Bard Herbert message. Pt now states that she is unsure that she needs to be seen because she thinks it is a yeast infection. Pt is requesting a call back to discuss.  Cb# [phone]

## 2018-02-23 NOTE — Telephone Encounter (Signed)
Patient states she is feeling some better. Is going to wait until tomorrow to see if it has changed at all. She will call back tomorrow to schedule if she feels she is going to need medication.

## 2018-02-23 NOTE — Telephone Encounter (Signed)
Either way, she will need to be seen.

## 2018-03-28 ENCOUNTER — Ambulatory Visit (INDEPENDENT_AMBULATORY_CARE_PROVIDER_SITE_OTHER): Payer: Self-pay | Admitting: Nurse Practitioner

## 2018-03-28 ENCOUNTER — Encounter: Payer: Self-pay | Admitting: Nurse Practitioner

## 2018-03-28 VITALS — BP 128/84 | HR 69 | Temp 98.0°F | Ht 62.0 in | Wt 210.1 lb

## 2018-03-28 DIAGNOSIS — R300 Dysuria: Secondary | ICD-10-CM

## 2018-03-28 DIAGNOSIS — I10 Essential (primary) hypertension: Secondary | ICD-10-CM

## 2018-03-28 LAB — UA/M W/RFLX CULTURE, ROUTINE
BILIRUBIN UA: POSITIVE — AB
Glucose, UA: NEGATIVE
Ketones, UA: NEGATIVE
LEUKOCYTES UA: NEGATIVE
NITRITE UA: NEGATIVE
Protein, UA: NEGATIVE
RBC, UA: NEGATIVE
SPEC GRAV UA: 1.015 (ref 1.005–1.030)
Urobilinogen, Ur: 0.2 mg/dL (ref 0.2–1.0)
pH, UA: 5.5 (ref 5.0–7.5)

## 2018-03-28 LAB — MICROSCOPIC EXAMINATION
Bacteria, UA: NONE SEEN
RBC MICROSCOPIC, UA: NONE SEEN /HPF (ref 0–2)

## 2018-03-28 NOTE — Patient Instructions (Signed)
Mindfulness-Based Stress Reduction  Mindfulness-based stress reduction (MBSR) is a program that helps people learn to practice mindfulness. Mindfulness is the practice of intentionally paying attention to the present moment. It can be learned and practiced through techniques such as education, breathing exercises, meditation, and yoga. MBSR includes several mindfulness techniques in one program.  MBSR works best when you understand the treatment, are willing to try new things, and can commit to spending time practicing what you learn. MBSR training may include learning about:  · How your emotions, thoughts, and reactions affect your body.  · New ways to respond to things that cause negative thoughts to start (triggers).  · How to notice your thoughts and let go of them.  · Practicing awareness of everyday things that you normally do without thinking.  · The techniques and goals of different types of meditation.    What are the benefits of MBSR?  MBSR can have many benefits, which include helping you to:  · Develop self-awareness. This refers to knowing and understanding yourself.  · Learn skills and attitudes that help you to participate in your own health care.  · Learn new ways to care for yourself.  · Be more accepting about how things are, and let things go.  · Be less judgmental and approach things with an open mind.  · Be patient with yourself and trust yourself more.    MBSR has also been shown to:  · Reduce negative emotions, such as depression and anxiety.  · Improve memory and focus.  · Change how you sense and approach pain.  · Boost your body's ability to fight infections.  · Help you connect better with other people.  · Improve your sense of well-being.    Follow these instructions at home:  · Find a local in-person or online MBSR program.  · Set aside some time regularly for mindfulness practice.  · Find a mindfulness practice that works best for you. This may include one or more of the  following:  ? Meditation. Meditation involves focusing your mind on a certain thought or activity.  ? Breathing awareness exercises. These help you to stay present by focusing on your breath.  ? Body scan. For this practice, you lie down and pay attention to each part of your body from head to toe. You can identify tension and soreness and intentionally relax parts of your body.  ? Yoga. Yoga involves stretching and breathing, and it can improve your ability to move and be flexible. It can also provide an experience of testing your body's limits, which can help you release stress.  ? Mindful eating. This way of eating involves focusing on the taste, texture, color, and smell of each bite of food. Because this slows down eating and helps you feel full sooner, it can be an important part of a weight-loss plan.  · Find a podcast or recording that provides guidance for breathing awareness, body scan, or meditation exercises. You can listen to these any time when you have a free moment to rest without distractions.  · Follow your treatment plan as told by your health care provider. This may include taking regular medicines and making changes to your diet or lifestyle as recommended.  How to practice mindfulness  To do a basic awareness exercise:  · Find a comfortable place to sit.  · Pay attention to the present moment. Observe your thoughts, feelings, and surroundings just as they are.  · Avoid placing judgment on yourself,   your feelings, or your surroundings. Make note of any judgment that comes up, and let it go.  · Your mind may wander, and that is okay. Make note of when your thoughts drift, and return your attention to the present moment.    To do basic mindfulness meditation:  · Find a comfortable place to sit. This may include a stable chair or a firm floor cushion.  ? Sit upright with your back straight. Let your arms fall next to your side with your hands resting on your legs.  ? If sitting in a chair, rest  your feet flat on the floor.  ? If sitting on a cushion, cross your legs in front of you.  · Keep your head in a neutral position with your chin dropped slightly. Relax your jaw and rest the tip of your tongue on the roof of your mouth. Drop your gaze to the floor. You can close your eyes if you like.  · Breathe normally and pay attention to your breath. Feel the air moving in and out of your nose. Feel your belly expanding and relaxing with each breath.  · Your mind may wander, and that is okay. Make note of when your thoughts drift, and return your attention to your breath.  · Avoid placing judgment on yourself, your feelings, or your surroundings. Make note of any judgment or feelings that come up, let them go, and bring your attention back to your breath.  · When you are ready, lift your gaze or open your eyes. Pay attention to how your body feels after the meditation.    Where to find more information:  You can find more information about MBSR from:  · Your health care provider.  · Community-based meditation centers or programs.  · Programs offered near you.    Summary  · Mindfulness-based stress reduction (MBSR) is a program that teaches you how to intentionally pay attention to the present moment. It is used with other treatments to help you cope better with daily stress, emotions, and pain.  · MBSR focuses on developing self-awareness, which allows you to respond to life stress without judgment or negative emotions.  · MBSR programs may involve learning different mindfulness practices, such as breathing exercises, meditation, yoga, body scan, or mindful eating. Find a mindfulness practice that works best for you, and set aside time for it on a regular basis.  This information is not intended to replace advice given to you by your health care provider. Make sure you discuss any questions you have with your health care provider.  Document Released: 08/19/2016 Document Revised: 08/19/2016 Document Reviewed:  08/19/2016  Elsevier Interactive Patient Education © 2018 [employer]

## 2018-03-28 NOTE — Assessment & Plan Note (Addendum)
Recent increased stressors.  Initial BP elevated, with improvement on repeat. Continued current med regimen.  Encouraged patient to monitor BP at home three days a week in morning and document.  Bring to next appointment.  Discussed meditation and relaxation techniques.  Return in one month for f/u.

## 2018-03-28 NOTE — Assessment & Plan Note (Signed)
Neg UA.  2+ bilirubin (pt with fatty liver, monitor).  Symptoms improving . Await culture and monitor.

## 2018-03-28 NOTE — Progress Notes (Signed)
BP 128/84 (BP Location: Left Arm, Patient Position: Sitting)   Pulse 69   Temp 98 F (36.7 C)   Ht 5\' 2"  (1.575 m)   Wt 210 lb 2 oz (95.3 kg)   SpO2 100%   BMI 38.43 kg/m    Subjective:    Patient ID: Jaime Freeman, female    DOB: [birth year], 62 y.o.   MRN: [id number]  HPI: Jaime Freeman is a 62 y.o. female presents with concerns over HTN and dysuria  Chief Complaint  Patient presents with  . Hypertension  . Dysuria   HYPERTENSION Was 170/84 when was at dentist and elevated in office today.  She reports under "a lot of stress" since loss of husband in March and first Christmas without husband.  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  Reports a lot of stressors at this time. Prior BP readings have been 137/75 and 141/72.  Repeat in office 128/84. Hypertension status: stable  Satisfied with current treatment? yes Duration of hypertension: chronic BP monitoring frequency:  not checking BP medication side effects:  no Medication compliance: excellent compliance Aspirin: yes Recurrent headaches: only two recent ones, due to stress (tension in neck) Visual changes: no Palpitations: no Dyspnea: no Chest pain: no Lower extremity edema: no Dizzy/lightheaded: no  DYSURIA: She was treated one month ago with abx for toothache, after this she noted dysuria.  At time was not hydrating like she "normally does".  Denies N&V, abdominal pain, vaginal discharge.  Reports some dysuria (intermittent), along with some frequency and urgency.  She reports the dysuria has been improving, but she wanted urine checked "while I am here".    Relevant past medical, surgical, family and social history reviewed and updated as indicated. Interim medical history since our last visit reviewed. Allergies and medications reviewed and updated.  Review of Systems  Constitutional: Negative for activity change, appetite change and fatigue.  Respiratory: Negative for cough, chest tightness and  shortness of breath.   Cardiovascular: Negative for chest pain, palpitations and leg swelling.  Gastrointestinal: Negative for abdominal distention, abdominal pain, constipation, diarrhea, nausea and vomiting.  Endocrine: Negative.   Genitourinary: Positive for dysuria, frequency and urgency.  Neurological: Negative for dizziness, numbness and headaches.  Psychiatric/Behavioral: The patient is nervous/anxious.     Per HPI unless specifically indicated above     Objective:    BP 128/84 (BP Location: Left Arm, Patient Position: Sitting)   Pulse 69   Temp 98 F (36.7 C)   Ht 5\' 2"  (1.575 m)   Wt 210 lb 2 oz (95.3 kg)   SpO2 100%   BMI 38.43 kg/m   Wt Readings from Last 3 Encounters:  03/28/18 210 lb 2 oz (95.3 kg)  12/22/17 210 lb 2 oz (95.3 kg)  12/13/17 209 lb (94.8 kg)    Physical Exam  Constitutional: She is oriented to person, place, and time. She appears well-developed and well-nourished.  HENT:  Head: Normocephalic.  Eyes: Pupils are equal, round, and reactive to light. Conjunctivae and EOM are normal. Right eye exhibits no discharge. Left eye exhibits no discharge.  Neck: Normal range of motion. Neck supple. No JVD present. Carotid bruit is not present. No thyromegaly present.  Cardiovascular: Normal rate, regular rhythm and normal heart sounds.  Pulmonary/Chest: Effort normal and breath sounds normal.  Abdominal: Soft. Bowel sounds are normal.  Genitourinary:  Genitourinary Comments: No suprapubic or CVA tenderness.  Lymphadenopathy:    She has no cervical adenopathy.  Neurological: She is alert and oriented to person, place, and time.  Skin: Skin is warm and dry.  Psychiatric: She has a normal mood and affect. Her behavior is normal. Judgment and thought content normal.  Nursing note and vitals reviewed.   Results for orders placed or performed in visit on 12/22/17  Bayer DCA Hb A1c Waived  Result Value Ref Range   HB A1C (BAYER DCA - WAIVED) 5.9 <7.0 %  CBC  with Differential/Platelet  Result Value Ref Range   WBC 6.8 3.4 - 10.8 x10E3/uL   RBC 3.71 (L) 3.77 - 5.28 x10E6/uL   Hemoglobin 11.5 11.1 - 15.9 g/dL   Hematocrit 35.2 34.0 - 46.6 %   MCV 95 79 - 97 fL   MCH 31.0 26.6 - 33.0 pg   MCHC 32.7 31.5 - 35.7 g/dL   RDW 13.4 12.3 - 15.4 %   Platelets 415 150 - 450 x10E3/uL   Neutrophils 59 Not Estab. %   Lymphs 31 Not Estab. %   Monocytes 7 Not Estab. %   Eos 3 Not Estab. %   Basos 0 Not Estab. %   Neutrophils Absolute 4.1 1.4 - 7.0 x10E3/uL   Lymphocytes Absolute 2.1 0.7 - 3.1 x10E3/uL   Monocytes Absolute 0.4 0.1 - 0.9 x10E3/uL   EOS (ABSOLUTE) 0.2 0.0 - 0.4 x10E3/uL   Basophils Absolute 0.0 0.0 - 0.2 x10E3/uL   Immature Granulocytes 0 Not Estab. %   Immature Grans (Abs) 0.0 0.0 - 0.1 x10E3/uL  Comprehensive metabolic panel  Result Value Ref Range   Glucose 85 65 - 99 mg/dL   BUN 17 8 - 27 mg/dL   Creatinine, Ser 0.74 0.57 - 1.00 mg/dL   GFR calc non Af Amer 88 >59 mL/min/1.73   GFR calc Af Amer 101 >59 mL/min/1.73   BUN/Creatinine Ratio 23 12 - 28   Sodium 143 134 - 144 mmol/L   Potassium 5.0 3.5 - 5.2 mmol/L   Chloride 106 96 - 106 mmol/L   CO2 24 20 - 29 mmol/L   Calcium 9.4 8.7 - 10.3 mg/dL   Total Protein 6.1 6.0 - 8.5 g/dL   Albumin 3.9 3.6 - 4.8 g/dL   Globulin, Total 2.2 1.5 - 4.5 g/dL   Albumin/Globulin Ratio 1.8 1.2 - 2.2   Bilirubin Total 0.2 0.0 - 1.2 mg/dL   Alkaline Phosphatase 55 39 - 117 IU/L   AST 16 0 - 40 IU/L   ALT 6 0 - 32 IU/L  Lipid Panel w/o Chol/HDL Ratio  Result Value Ref Range   Cholesterol, Total 189 100 - 199 mg/dL   Triglycerides 75 0 - 149 mg/dL   HDL 55 >39 mg/dL   VLDL Cholesterol Cal 15 5 - 40 mg/dL   LDL Calculated 119 (H) 0 - 99 mg/dL  VITAMIN D 25 Hydroxy (Vit-D Deficiency, Fractures)  Result Value Ref Range   Vit D, 25-Hydroxy 28.8 (L) 30.0 - 100.0 ng/mL  B12 and Folate Panel  Result Value Ref Range   Vitamin B-12 302 232 - 1,245 pg/mL   Folate 5.1 >3.0 ng/mL        Assessment & Plan:   Problem List Items Addressed This Visit      Cardiovascular and Mediastinum   Hypertension, benign    Recent increased stressors.  Initial BP elevated, with improvement on repeat. Continued current med regimen.  Encouraged patient to monitor BP at home three days a week in morning and document.  Bring to next appointment.  Discussed  meditation and relaxation techniques.  Return in one month for f/u.        Other   Dysuria - Primary    Neg UA.  2+ bilirubin (pt with fatty liver, monitor).  Symptoms improving . Await culture and monitor.      Relevant Orders   UA/M w/rflx Culture, Routine       Follow up plan: Return in about 1 month (around 04/28/2018) for HTN.

## 2018-06-29 ENCOUNTER — Other Ambulatory Visit: Payer: Self-pay | Admitting: Family Medicine

## 2018-06-29 ENCOUNTER — Telehealth: Payer: Self-pay | Admitting: Family Medicine

## 2018-06-29 MED ORDER — ALBUTEROL SULFATE HFA 108 (90 BASE) MCG/ACT IN AERS: 2.0000 | INHALATION_SPRAY | RESPIRATORY_TRACT | 3 refills | Status: DC | PRN

## 2018-06-29 NOTE — Telephone Encounter (Signed)
Copied from [place] [id number]. Topic: Quick Communication - Rx Refill/Question >> Jun 29, 2018  4:23 PM Oneta Rack wrote: Medication: albuterol (PROVENTIL HFA;VENTOLIN HFA) 108 (90 Base) MCG/ACT inhaler   Has the patient contacted their pharmacy? Yes   (Agent: If yes, when and what did the pharmacy advise?) pharmacy advised to call PCP office to see if it can be expedited   Preferred Pharmacy (with phone number or street name):  [facility], [place], [place] [address] Phone: [phone] Fax: [phone] Not a 24 hour pharmacy; exact hours not known.  Agent: Please be advised that RX refills may take up to 3 business days. We ask that you follow-up with your pharmacy.

## 2018-06-29 NOTE — Telephone Encounter (Signed)
She should have refills on that.

## 2018-06-29 NOTE — Telephone Encounter (Signed)
Dr Wynetta Emery sent in the new script for patients proventil.Marland KitchenSpoke with the patient to let her know  [facility]

## 2018-07-19 ENCOUNTER — Telehealth: Payer: Self-pay | Admitting: Family Medicine

## 2018-07-19 DIAGNOSIS — F332 Major depressive disorder, recurrent severe without psychotic features: Secondary | ICD-10-CM

## 2018-07-19 MED ORDER — QUETIAPINE FUMARATE 25 MG PO TABS: ORAL_TABLET | ORAL | 1 refills | Status: DC

## 2018-07-19 MED ORDER — ETODOLAC 400 MG PO TABS: 400.0000 mg | ORAL_TABLET | Freq: Two times a day (BID) | ORAL | 3 refills | Status: DC | PRN

## 2018-07-19 NOTE — Addendum Note (Signed)
Addended by: Valerie Roys on: 07/19/2018 03:01 PM   Modules accepted: Orders

## 2018-07-19 NOTE — Telephone Encounter (Signed)
Copied from [place] [id number]. Topic: Quick Communication - Rx Refill/Question >> Jul 19, 2018  8:49 AM Waylan Rocher, Lumin L wrote: Medication: QUEtiapine (SEROQUEL) 25 MG tablet, benazepril (LOTENSIN) 20 MG tablet, etodolac (LODINE) 400 MG tablet (early fill request for all)  Has the patient contacted their pharmacy? yes (Agent: If no, request that the patient contact the pharmacy for the refill.) (Agent: If yes, when and what did the pharmacy advise?)  Preferred Pharmacy (with phone number or street name): [facility], [place], [place] [address] Phone: [phone] Fax: [phone] (closes 6pm today)  Agent: Please be advised that RX refills may take up to 3 business days. We ask that you follow-up with your pharmacy.

## 2018-08-01 ENCOUNTER — Telehealth: Payer: Self-pay | Admitting: Family Medicine

## 2018-08-01 MED ORDER — AMOXICILLIN-POT CLAVULANATE 875-125 MG PO TABS: 1.0000 | ORAL_TABLET | Freq: Two times a day (BID) | ORAL | 0 refills | Status: DC

## 2018-08-01 NOTE — Telephone Encounter (Signed)
Called and left patient a VM letting her know what Dr. Wynetta Emery said.

## 2018-08-01 NOTE — Telephone Encounter (Signed)
Patient returning call to office. Informed her that dr Wynetta Emery had sent in an antibiotic to her pharmacy.

## 2018-08-01 NOTE — Telephone Encounter (Signed)
Patient has a bad tooth which has gotten infected possibly abscessed at this time. She has not been able to get it taken care of due to the fact she has to be put asleep and had issues that she could not be put under and also with COVID 19 going on also.  She has a virtual appointment scheduled for Friday with Dr Wynetta Emery.  She is just wanting to know if possible could an antibiotic be sent in for the tooth until she can get in with the dental office.   Thank You

## 2018-08-01 NOTE — Telephone Encounter (Signed)
See below. Medicine sent to her pharmacy

## 2018-08-01 NOTE — Telephone Encounter (Signed)
Copied from [place] [id number]. Topic: General - Other >> Aug 01, 2018 11:40 AM Antonieta Iba C wrote: Reason for CRM: pt says that she is returning the office call, not showing a note. Pt says that she has an upcoming apt on Friday 08/04/18.   Please advise. >> Aug 01, 2018  4:17 PM Shaune Pollack wrote: Dr Wynetta Emery just making sure you received the earlier note on patient regarding her tooth that is possibly abcessed or infected.  This message was sent through I think afterwards.  Sorry for the duplicate.  Thank you

## 2018-08-01 NOTE — Telephone Encounter (Signed)
Medicine for tooth sent to her pharmacy

## 2018-08-04 ENCOUNTER — Ambulatory Visit (INDEPENDENT_AMBULATORY_CARE_PROVIDER_SITE_OTHER): Payer: Self-pay | Admitting: Family Medicine

## 2018-08-04 ENCOUNTER — Other Ambulatory Visit: Payer: Self-pay

## 2018-08-04 ENCOUNTER — Encounter: Payer: Self-pay | Admitting: Family Medicine

## 2018-08-04 ENCOUNTER — Telehealth: Payer: Self-pay | Admitting: Family Medicine

## 2018-08-04 VITALS — BP 160/83 | HR 69

## 2018-08-04 DIAGNOSIS — F332 Major depressive disorder, recurrent severe without psychotic features: Secondary | ICD-10-CM

## 2018-08-04 DIAGNOSIS — I129 Hypertensive chronic kidney disease with stage 1 through stage 4 chronic kidney disease, or unspecified chronic kidney disease: Secondary | ICD-10-CM

## 2018-08-04 MED ORDER — BENAZEPRIL HCL 20 MG PO TABS: 20.0000 mg | ORAL_TABLET | Freq: Every day | ORAL | 1 refills | Status: DC

## 2018-08-04 MED ORDER — FLUCONAZOLE 150 MG PO TABS: 150.0000 mg | ORAL_TABLET | Freq: Every day | ORAL | 0 refills | Status: DC

## 2018-08-04 MED ORDER — QUETIAPINE FUMARATE 25 MG PO TABS: ORAL_TABLET | ORAL | 1 refills | Status: DC

## 2018-08-04 NOTE — Progress Notes (Signed)
BP (!) 160/83   Pulse 69 Comment: pt reported-virtual visit   Subjective:    Patient ID: Jaime Freeman, female    DOB: Jul 28, 1955, 63 y.o.   MRN: [id number]  HPI: Jaime Freeman is a 63 y.o. female  Chief Complaint  Patient presents with  . Anxiety  . Hypertension  . TELEMEDICINE VISIT   HYPERTENSION Hypertension status: exacerbated  Satisfied with current treatment? yes Duration of hypertension: chronic BP monitoring frequency:  not checking BP medication side effects:  no Medication compliance: excellent compliance Previous BP meds: benazepril Aspirin: yes Recurrent headaches: no Visual changes: no Palpitations: no Dyspnea: no Chest pain: no Lower extremity edema: no Dizzy/lightheaded: no  DEPRESSION Mood status: exacerbated Satisfied with current treatment?: no Symptom severity: moderate  Duration of current treatment : chronic Side effects: no Medication compliance: good compliance Psychotherapy/counseling: no  Previous psychiatric medications: seroquel Depressed mood: yes Anxious mood: yes Anhedonia: no Significant weight loss or gain: no Insomnia: yes hard to fall asleep Fatigue: yes Feelings of worthlessness or guilt: yes Impaired concentration/indecisiveness: no Suicidal ideations: no Hopelessness: no Crying spells: yes Depression screen [facility] 2/9 08/04/2018 12/13/2017 12/06/2017 11/04/2017 10/17/2017  Decreased Interest 0 0 0 3 3  Down, Depressed, Hopeless 1 0 0 3 3  PHQ - 2 Score 1 0 0 6 6  Altered sleeping 1 0 0 3 2  Tired, decreased energy 1 0 0 3 3  Change in appetite 0 0 0 3 3  Feeling bad or failure about yourself  0 0 0 3 0  Trouble concentrating 1 0 0 3 3  Moving slowly or fidgety/restless 0 0 0 3 3  Suicidal thoughts 0 0 0 0 0  PHQ-9 Score 4 0 0 24 20  Difficult doing work/chores Not difficult at all - - Extremely dIfficult Very difficult   GAD 7 : Generalized Anxiety Score 08/04/2018 12/06/2017 11/04/2017 10/17/2017  Nervous, Anxious, on  Edge 1 0 3 3  Control/stop worrying 0 0 3 3  Worry too much - different things 0 0 3 3  Trouble relaxing 0 0 3 3  Restless 0 0 3 3  Easily annoyed or irritable 1 0 3 3  Afraid - awful might happen 0 0 3 3  Total GAD 7 Score 2 0 21 21  Anxiety Difficulty Not difficult at all - Extremely difficult Very difficult    Relevant past medical, surgical, family and social history reviewed and updated as indicated. Interim medical history since our last visit reviewed. Allergies and medications reviewed and updated.  Review of Systems  Constitutional: Negative.   Respiratory: Negative.   Cardiovascular: Negative.   Musculoskeletal: Negative.   Skin: Negative.   Psychiatric/Behavioral: Positive for dysphoric mood and sleep disturbance. Negative for agitation, behavioral problems, confusion, decreased concentration, hallucinations, self-injury and suicidal ideas. The patient is nervous/anxious. The patient is not hyperactive.     Per HPI unless specifically indicated above     Objective:    BP (!) 160/83   Pulse 69 Comment: pt reported-virtual visit  Wt Readings from Last 3 Encounters:  03/28/18 210 lb 2 oz (95.3 kg)  12/22/17 210 lb 2 oz (95.3 kg)  12/13/17 209 lb (94.8 kg)    Physical Exam Vitals signs and nursing note reviewed.  Pulmonary:     Effort: Pulmonary effort is normal. No respiratory distress.     Comments: Speaking in full sentences Neurological:     Mental Status: She is alert.  Psychiatric:  Mood and Affect: Mood normal.        Behavior: Behavior normal.        Thought Content: Thought content normal.        Judgment: Judgment normal.     Results for orders placed or performed in visit on 03/28/18  Microscopic Examination  Result Value Ref Range   WBC, UA 0-5 0 - 5 /hpf   RBC, UA None seen 0 - 2 /hpf   Epithelial Cells (non renal) 0-10 0 - 10 /hpf   Renal Epithel, UA 0-10 (A) None seen /hpf   Bacteria, UA None seen None seen/Few  UA/M w/rflx  Culture, Routine  Result Value Ref Range   Specific Gravity, UA 1.015 1.005 - 1.030   pH, UA 5.5 5.0 - 7.5   Color, UA Yellow Yellow   Appearance Ur Clear Clear   Leukocytes, UA Negative Negative   Protein, UA Negative Negative/Trace   Glucose, UA Negative Negative   Ketones, UA Negative Negative   RBC, UA Negative Negative   Bilirubin, UA Positive (A) Negative   Urobilinogen, Ur 0.2 0.2 - 1.0 mg/dL   Nitrite, UA Negative Negative   Microscopic Examination See below:       Assessment & Plan:   Problem List Items Addressed This Visit      Genitourinary   Benign hypertensive renal disease    Elevated today, she thinks due to stress from the COVID-19 pandemic. Will work on [employer] and get anxiety under better control. Recheck 1 month. Call with any concerns.         Other   Depression - Primary    Not doing great, having issues with sleep. Will increase seroquel to 50mg  qHS and continue 12.5mg  PRN during the day. Recheck 1 month. Call with any concerns.       Relevant Medications   QUEtiapine (SEROQUEL) 25 MG tablet       Follow up plan: Return in about 4 weeks (around 09/01/2018) for follow up BP and mood.    . This visit was completed via telephone due to the restrictions of the COVID-19 pandemic. All issues as above were discussed and addressed but no physical exam was performed. If it was felt that the patient should be evaluated in the office, they were directed there. The patient verbally consented to this visit. Patient was unable to complete an audio/visual visit due to Lack of equipment. Attempted to call patient with 2 different audio/visual tools and patient was not able to complete the call that way. Due to the catastrophic nature of the COVID-19 pandemic, this visit was done through audio contact only. . Location of the patient: home . Location of the provider: home . Those involved with this call:  . Provider: Park Liter, DO . CMA: Yvonna Alanis, [place] .  Front Desk/Registration: Linard Millers  . Time spent on call: 25 minutes with patient face to face via video conference. More than 50% of this time was spent in counseling and coordination of care. 40 minutes total spent in review of patient's record and preparation of their chart.

## 2018-08-04 NOTE — Assessment & Plan Note (Signed)
Elevated today, she thinks due to stress from the COVID-19 pandemic. Will work on [employer] and get anxiety under better control. Recheck 1 month. Call with any concerns.

## 2018-08-04 NOTE — Telephone Encounter (Signed)
Copied from [place] [id number]. Topic: General - Other >> Aug 04, 2018  2:43 PM Rutherford Nail, NT wrote: Reason for CRM: Patient calling to let Dr Wynetta Emery know that her blood pressure has went down from her visit this morning. States that it is 145/78 and that was at 2:40pm.

## 2018-08-04 NOTE — Telephone Encounter (Signed)
Thank you :)

## 2018-08-04 NOTE — Assessment & Plan Note (Signed)
Not doing great, having issues with sleep. Will increase seroquel to 50mg  qHS and continue 12.5mg  PRN during the day. Recheck 1 month. Call with any concerns.

## 2018-09-21 ENCOUNTER — Ambulatory Visit: Payer: Self-pay | Admitting: Family Medicine

## 2018-10-16 ENCOUNTER — Telehealth: Payer: Self-pay | Admitting: Family Medicine

## 2018-10-16 NOTE — Telephone Encounter (Signed)
Refills of antibiotics are not appropriate. Will need to be seen for re-evaluation.

## 2018-10-16 NOTE — Telephone Encounter (Signed)
Medication Refill - Medication: augmentin  Has the patient contacted their pharmacy? Yes.   (Agent: If no, request that the patient contact the pharmacy for the refill.) (Agent: If yes, when and what did the pharmacy advise?)  Preferred Pharmacy (with phone number or street name):[facility]  Pt says that she is not able to get an appt with dentist yet.  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 We ask that you follow-up with your pharmacy.

## 2018-10-17 NOTE — Telephone Encounter (Signed)
Pt called back, and advised to make an appt for the abx. Pt verbalized understanding. Attempted to contact office, no answer.  Pt aware someone will call her back.

## 2018-10-17 NOTE — Telephone Encounter (Signed)
Called and left a vm to let patient know to schedule an appointment if she needs more antibiotics.

## 2018-10-18 NOTE — Telephone Encounter (Signed)
Called pt but line was busy. ?

## 2018-10-19 ENCOUNTER — Encounter: Payer: Self-pay | Admitting: Family Medicine

## 2018-10-19 ENCOUNTER — Other Ambulatory Visit: Payer: Self-pay

## 2018-10-19 ENCOUNTER — Ambulatory Visit (INDEPENDENT_AMBULATORY_CARE_PROVIDER_SITE_OTHER): Payer: Self-pay | Admitting: Family Medicine

## 2018-10-19 ENCOUNTER — Ambulatory Visit: Payer: Medicaid Other | Admitting: Family Medicine

## 2018-10-19 VITALS — BP 151/83 | HR 71 | Temp 98.3°F

## 2018-10-19 DIAGNOSIS — L989 Disorder of the skin and subcutaneous tissue, unspecified: Secondary | ICD-10-CM

## 2018-10-19 DIAGNOSIS — B351 Tinea unguium: Secondary | ICD-10-CM

## 2018-10-19 DIAGNOSIS — H6123 Impacted cerumen, bilateral: Secondary | ICD-10-CM

## 2018-10-19 DIAGNOSIS — K0889 Other specified disorders of teeth and supporting structures: Secondary | ICD-10-CM

## 2018-10-19 MED ORDER — AMOXICILLIN-POT CLAVULANATE 875-125 MG PO TABS: 1.0000 | ORAL_TABLET | Freq: Two times a day (BID) | ORAL | 0 refills | Status: DC

## 2018-10-19 NOTE — Progress Notes (Signed)
BP (!) 151/83   Pulse 71   Temp 98.3 F (36.8 C) (Oral)   SpO2 99%    Subjective:    Patient ID: Jaime Freeman, female    DOB: 11/01/1955, 63 y.o.   MRN: [id number]  HPI: Jaime Freeman is a 63 y.o. female  Chief Complaint  Patient presents with  . Dental Pain    pt states she is needing to have tooth surgery, but will not have it due to COVID. Requesting medication for it  . Ear Fullness    requesting ear levage  . Rash    pt states she has bumps on her chest she would like looked at   Here today for multiple concerns.   Right upper dental pain, supposed to have had dental surgery but it got cancelled due to COVID restrictions. Now having worsening pain and drainage, bad taste in mouth. Denies fevers, jaw swelling, headaches. Not currently on anything for sxs.   Dark, thickened toenails x 3-4 years, has never tried anything for this and doesn't find it particularly bothersome but would like to get it taken care of.   Has a few scattered skin lesions she would like checked out. None have really been changing, bleeding, scaling or any other changes.   Would also like to have her ears cleaned out. Has tried debrox drops before but notes they just block them more. Hearing getting worse so she knows they are blocking up. Has to get this done frequently.   Relevant past medical, surgical, family and social history reviewed and updated as indicated. Interim medical history since our last visit reviewed. Allergies and medications reviewed and updated.  Review of Systems  Per HPI unless specifically indicated above     Objective:    BP (!) 151/83   Pulse 71   Temp 98.3 F (36.8 C) (Oral)   SpO2 99%   Wt Readings from Last 3 Encounters:  03/28/18 210 lb 2 oz (95.3 kg)  12/22/17 210 lb 2 oz (95.3 kg)  12/13/17 209 lb (94.8 kg)    Physical Exam Vitals signs and nursing note reviewed.  Constitutional:      Appearance: Normal appearance. She is not ill-appearing.  HENT:      Head: Atraumatic.     Ears:     Comments: Mild cerumen debris b/l EACs TMs appear benign aside from mild middle ear effusion b/l    Mouth/Throat:     Comments: No obvious dental abscess in her area of pain Eyes:     Extraocular Movements: Extraocular movements intact.     Conjunctiva/sclera: Conjunctivae normal.  Neck:     Musculoskeletal: Normal range of motion and neck supple.  Cardiovascular:     Rate and Rhythm: Normal rate and regular rhythm.     Heart sounds: Normal heart sounds.  Pulmonary:     Effort: Pulmonary effort is normal.     Breath sounds: Normal breath sounds.  Musculoskeletal: Normal range of motion.  Skin:    General: Skin is warm and dry.     Comments: Multiple seb Ks present across body sporadically Toenails mildly thickened. Currently painted so cannot assess if discolored  Neurological:     Mental Status: She is alert and oriented to person, place, and time.  Psychiatric:        Mood and Affect: Mood normal.        Thought Content: Thought content normal.        Judgment: Judgment normal.  Procedure: b/l ear lavage Procedure reviewed in detail, questions answered adequately. Warm water and lavage tool used to clear cerumen debris from b/l ears. No immediate complications noted, she tolerated the procedure well.  Results for orders placed or performed in visit on 03/28/18  Microscopic Examination   URINE  Result Value Ref Range   WBC, UA 0-5 0 - 5 /hpf   RBC, UA None seen 0 - 2 /hpf   Epithelial Cells (non renal) 0-10 0 - 10 /hpf   Renal Epithel, UA 0-10 (A) None seen /hpf   Bacteria, UA None seen None seen/Few  UA/M w/rflx Culture, Routine   Specimen: Urine   URINE  Result Value Ref Range   Specific Gravity, UA 1.015 1.005 - 1.030   pH, UA 5.5 5.0 - 7.5   Color, UA Yellow Yellow   Appearance Ur Clear Clear   Leukocytes, UA Negative Negative   Protein, UA Negative Negative/Trace   Glucose, UA Negative Negative   Ketones, UA Negative  Negative   RBC, UA Negative Negative   Bilirubin, UA Positive (A) Negative   Urobilinogen, Ur 0.2 0.2 - 1.0 mg/dL   Nitrite, UA Negative Negative   Microscopic Examination See below:       Assessment & Plan:   Problem List Items Addressed This Visit    None    Visit Diagnoses    Pain, dental    -  Primary   Will start abx as her dental procedure has been delayed due to COVID 19. Salt water gargles, warm compresses to face. Return precautions given   Bilateral impacted cerumen       Ears lavaged today with good relief. TMs remained intact. Continue debrox prn at home   Skin lesions       Reassurance given for benign appearing lesions that are consistent with seb Ks.    Onychomycosis       Lamisil OTC x 6-12 months. Foot powders, keep feet clean and dry. F/u if not improving       Follow up plan: Return for as scheduled.

## 2018-10-19 NOTE — Patient Instructions (Signed)
lamisil topical medication over the counter - use 1-2 times daily for 6-12 months as needed

## 2018-11-08 ENCOUNTER — Other Ambulatory Visit: Payer: Self-pay

## 2018-11-08 ENCOUNTER — Other Ambulatory Visit: Payer: Medicaid Other

## 2018-11-08 ENCOUNTER — Telehealth: Payer: Self-pay | Admitting: Family Medicine

## 2018-11-08 NOTE — Telephone Encounter (Signed)
Copied from [place] [id number]. Topic: General - Other >> Nov 08, 2018 10:06 AM Keene Breath wrote: Reason for CRM: Patient called to ask if she can bring a sample of her urine to the lab because she thinks she might have a yeast infection or UTI.  She would like it tested and then have a script called in.  Please advise and call patient back at [phone]

## 2018-11-08 NOTE — Telephone Encounter (Signed)
Please set up appointment. Virtual OK

## 2018-11-09 ENCOUNTER — Encounter: Payer: Self-pay | Admitting: Family Medicine

## 2018-11-09 ENCOUNTER — Ambulatory Visit (INDEPENDENT_AMBULATORY_CARE_PROVIDER_SITE_OTHER): Payer: Self-pay | Admitting: Family Medicine

## 2018-11-09 ENCOUNTER — Other Ambulatory Visit: Payer: Self-pay

## 2018-11-09 VITALS — BP 153/89 | HR 83 | Wt 209.0 lb

## 2018-11-09 DIAGNOSIS — R300 Dysuria: Secondary | ICD-10-CM

## 2018-11-09 DIAGNOSIS — N898 Other specified noninflammatory disorders of vagina: Secondary | ICD-10-CM

## 2018-11-09 LAB — UA/M W/RFLX CULTURE, ROUTINE
Bilirubin, UA: NEGATIVE
Glucose, UA: NEGATIVE
Ketones, UA: NEGATIVE
Leukocytes,UA: NEGATIVE
Nitrite, UA: NEGATIVE
Protein,UA: NEGATIVE
RBC, UA: NEGATIVE
Specific Gravity, UA: 1.02 (ref 1.005–1.030)
Urobilinogen, Ur: 0.2 mg/dL (ref 0.2–1.0)
pH, UA: 7 (ref 5.0–7.5)

## 2018-11-09 MED ORDER — FLUCONAZOLE 150 MG PO TABS: 150.0000 mg | ORAL_TABLET | ORAL | 0 refills | Status: DC

## 2018-11-09 NOTE — Telephone Encounter (Signed)
Pt scheduled today

## 2018-11-09 NOTE — Progress Notes (Signed)
BP (!) 153/89   Pulse 83   Wt 209 lb (94.8 kg)   BMI 38.23 kg/m    Subjective:    Patient ID: Jaime Freeman, female    DOB: Nov 21, 1955, 63 y.o.   MRN: [id number]  HPI: Jaime Freeman is a 63 y.o. female  Chief Complaint  Patient presents with  . Urinary Tract Infection    Off and on x 1 week. Felt iritation first, used monostat first did help then pain become interal. Some burning, but not severe  . Medication Management    Pt did not start/complete recent antibioitcs due to concerns of causing yeast infection    . This visit was completed via telephone due to the restrictions of the COVID-19 pandemic. All issues as above were discussed and addressed. Physical exam was done as above through visual confirmation on telephone. If it was felt that the patient should be evaluated in the office, they were directed there. The patient verbally consented to this visit. . Location of the patient: home . Location of the provider: work . Those involved with this call:  . Provider: Merrie Roof, PA-C . CMA: Yvonna Alanis, [place] . Front Desk/Registration: Jill Side  . Time spent on call: 15 minutes on the phone discussing health concerns. 5 minutes total spent in review of patient's record and preparation of their chart. I verified patient identity using two factors (patient name and date of birth). Patient consents verbally to being seen via telemedicine visit today.   1 week of vaginal irritation, soreness and dysuria. Notes she accidentally washed her stomach and her private areas with the same rag which she's usually very careful about because she keeps a yeast infection under her breasts. Sxs started shortly after this episode and she feels it's related. Tried some monistat at home which did help but sxs returned shortly after d/c. Denies hematuria, flank pain, abdominal pain, N/V/D, fevers, chills, concern for STIs.     Relevant past medical, surgical, family and social history  reviewed and updated as indicated. Interim medical history since our last visit reviewed. Allergies and medications reviewed and updated.  Review of Systems  Per HPI unless specifically indicated above     Objective:    BP (!) 153/89   Pulse 83   Wt 209 lb (94.8 kg)   BMI 38.23 kg/m   Wt Readings from Last 3 Encounters:  11/09/18 209 lb (94.8 kg)  03/28/18 210 lb 2 oz (95.3 kg)  12/22/17 210 lb 2 oz (95.3 kg)    Physical Exam  Unable to perform PE as patient did not have access to video technology for today's visit  Results for orders placed or performed in visit on 11/09/18  UA/M w/rflx Culture, Routine   Specimen: Urine   URINE  Result Value Ref Range   Specific Gravity, UA 1.020 1.005 - 1.030   pH, UA 7.0 5.0 - 7.5   Color, UA Yellow Yellow   Appearance Ur Clear Clear   Leukocytes,UA Negative Negative   Protein,UA Negative Negative/Trace   Glucose, UA Negative Negative   Ketones, UA Negative Negative   RBC, UA Negative Negative   Bilirubin, UA Negative Negative   Urobilinogen, Ur 0.2 0.2 - 1.0 mg/dL   Nitrite, UA Negative Negative      Assessment & Plan:   Problem List Items Addressed This Visit    None    Visit Diagnoses    Vaginal irritation    -  Primary  U/A benign, declines wet prep today. Will tx with diflucan and probiotics, f/u if not improving for wet prep and in person examination   Dysuria       Relevant Orders   UA/M w/rflx Culture, Routine (Completed)       Follow up plan: Return if symptoms worsen or fail to improve.

## 2018-12-28 ENCOUNTER — Other Ambulatory Visit: Payer: Self-pay | Admitting: Family Medicine

## 2018-12-28 NOTE — Telephone Encounter (Signed)
Called pt to schedule appt, no answer, left vm 

## 2018-12-28 NOTE — Telephone Encounter (Signed)
Needs appointment

## 2018-12-28 NOTE — Telephone Encounter (Signed)
Requested medication (s) are due for refill today: yes  Requested medication (s) are on the active medication list: yes  Last refill:  11/13/18  Future visit scheduled: no  Notes to clinic:  Review for refill  Requested Prescriptions  Pending Prescriptions Disp Refills   omeprazole (PRILOSEC) 20 MG capsule [Pharmacy Med Name: omeprazole 20 mg capsule,delayed release] 90 capsule 1    Sig: TAKE ONE CAPSULE BY MOUTH ONCE DAILY     Gastroenterology: Proton Pump Inhibitors Passed - 12/28/2018 12:30 PM      Passed - Valid encounter within last 12 months    Recent Outpatient Visits          1 month ago Vaginal irritation   [facility], [place]   2 months ago Pain, dental   [facility], [place]   4 months ago Severe episode of recurrent major depressive disorder, without psychotic features ([place])   Fabens, Megan P, DO   9 months ago [place], Henrine Screws T, NP   1 year ago Hypertension, benign   [place], [place], DO

## 2018-12-28 NOTE — Telephone Encounter (Signed)
Routing to provider  

## 2018-12-29 ENCOUNTER — Encounter: Payer: Self-pay | Admitting: Family Medicine

## 2018-12-29 NOTE — Telephone Encounter (Signed)
Also mailing letter for refill appt

## 2019-01-11 ENCOUNTER — Other Ambulatory Visit: Payer: Self-pay

## 2019-01-11 ENCOUNTER — Telehealth: Payer: Self-pay | Admitting: Family Medicine

## 2019-01-11 ENCOUNTER — Encounter: Payer: Self-pay | Admitting: Family Medicine

## 2019-01-11 ENCOUNTER — Ambulatory Visit (INDEPENDENT_AMBULATORY_CARE_PROVIDER_SITE_OTHER): Payer: Self-pay | Admitting: Family Medicine

## 2019-01-11 VITALS — BP 150/85 | HR 75 | Temp 98.8°F | Ht 62.0 in | Wt 208.0 lb

## 2019-01-11 DIAGNOSIS — R21 Rash and other nonspecific skin eruption: Secondary | ICD-10-CM

## 2019-01-11 MED ORDER — TRIAMCINOLONE ACETONIDE 0.1 % EX CREA: 1.0000 "application " | TOPICAL_CREAM | Freq: Two times a day (BID) | CUTANEOUS | 0 refills | Status: DC

## 2019-01-11 MED ORDER — OMEPRAZOLE 20 MG PO CPDR: 20.0000 mg | DELAYED_RELEASE_CAPSULE | Freq: Every day | ORAL | 5 refills | Status: DC

## 2019-01-11 NOTE — Telephone Encounter (Signed)
Use BID prn for bug bite on arm - I'm not sure what they need clarified as it states 1 application twice daily  Copied from [place] [id number]. Topic: General - Other >> Jan 11, 2019  4:48 PM Jaime Freeman wrote: Reason for CRM: need clarification and direction for Triamcinolone cream

## 2019-01-11 NOTE — Progress Notes (Signed)
BP (!) 150/85   Pulse 75   Temp 98.8 F (37.1 C) (Oral)   Ht 5\' 2"  (1.575 m)   Wt 208 lb (94.3 kg)   SpO2 99%   BMI 38.04 kg/m    Subjective:    Patient ID: Jaime Freeman, female    DOB: 04-12-1956, 63 y.o.   MRN: [id number]  HPI: EILLEN Freeman is a 63 y.o. female  Chief Complaint  Patient presents with  . Insect Bite    left arm. pt states she has been biten by something she did not see it, last night. red, swollen, and itchy   Patient presenting today with what she thinks may be an ant bite to left upper arm. First noticed last night and since it has been itchy, red and the area becoming larger. Has not been applying anything or taking anything for sxs. Denies CP, SOB, throat swelling or itching, fever.   Relevant past medical, surgical, family and social history reviewed and updated as indicated. Interim medical history since our last visit reviewed. Allergies and medications reviewed and updated.  Review of Systems  Per HPI unless specifically indicated above     Objective:    BP (!) 150/85   Pulse 75   Temp 98.8 F (37.1 C) (Oral)   Ht 5\' 2"  (1.575 m)   Wt 208 lb (94.3 kg)   SpO2 99%   BMI 38.04 kg/m   Wt Readings from Last 3 Encounters:  01/11/19 208 lb (94.3 kg)  11/09/18 209 lb (94.8 kg)  03/28/18 210 lb 2 oz (95.3 kg)    Physical Exam Vitals signs and nursing note reviewed.  Constitutional:      Appearance: Normal appearance. She is not ill-appearing.  HENT:     Head: Atraumatic.  Eyes:     Extraocular Movements: Extraocular movements intact.     Conjunctiva/sclera: Conjunctivae normal.  Neck:     Musculoskeletal: Normal range of motion and neck supple.  Cardiovascular:     Rate and Rhythm: Normal rate and regular rhythm.     Heart sounds: Normal heart sounds.  Pulmonary:     Effort: Pulmonary effort is normal.     Breath sounds: Normal breath sounds.  Musculoskeletal: Normal range of motion.  Skin:    General: Skin is warm and dry.   Findings: Erythema (and edema, well circumscribed, surrounding insect bite inner left upper arm) present.  Neurological:     Mental Status: She is alert and oriented to person, place, and time.  Psychiatric:        Mood and Affect: Mood normal.        Thought Content: Thought content normal.        Judgment: Judgment normal.     Results for orders placed or performed in visit on 11/09/18  UA/M w/rflx Culture, Routine   Specimen: Urine   URINE  Result Value Ref Range   Specific Gravity, UA 1.020 1.005 - 1.030   pH, UA 7.0 5.0 - 7.5   Color, UA Yellow Yellow   Appearance Ur Clear Clear   Leukocytes,UA Negative Negative   Protein,UA Negative Negative/Trace   Glucose, UA Negative Negative   Ketones, UA Negative Negative   RBC, UA Negative Negative   Bilirubin, UA Negative Negative   Urobilinogen, Ur 0.2 0.2 - 1.0 mg/dL   Nitrite, UA Negative Negative      Assessment & Plan:   Problem List Items Addressed This Visit    None  Visit Diagnoses    Rash    -  Primary   Localized allergic reaction to insect bite. Triamcinolone sent, take benadryl prn, ice off and on. F/u if sxs worsening       Follow up plan: Return in about 4 weeks (around 02/08/2019) for 6 month f/u with Dr. Wynetta Emery.

## 2019-01-11 NOTE — Telephone Encounter (Signed)
Called and clarified medication with the pharmacy.

## 2019-02-08 ENCOUNTER — Ambulatory Visit (INDEPENDENT_AMBULATORY_CARE_PROVIDER_SITE_OTHER): Payer: Self-pay | Admitting: Family Medicine

## 2019-02-08 ENCOUNTER — Encounter: Payer: Self-pay | Admitting: Family Medicine

## 2019-02-08 ENCOUNTER — Other Ambulatory Visit: Payer: Self-pay

## 2019-02-08 VITALS — BP 129/85 | HR 74 | Temp 98.7°F | Ht 62.0 in | Wt 208.0 lb

## 2019-02-08 DIAGNOSIS — F419 Anxiety disorder, unspecified: Secondary | ICD-10-CM

## 2019-02-08 DIAGNOSIS — E785 Hyperlipidemia, unspecified: Secondary | ICD-10-CM

## 2019-02-08 DIAGNOSIS — K760 Fatty (change of) liver, not elsewhere classified: Secondary | ICD-10-CM

## 2019-02-08 DIAGNOSIS — I129 Hypertensive chronic kidney disease with stage 1 through stage 4 chronic kidney disease, or unspecified chronic kidney disease: Secondary | ICD-10-CM

## 2019-02-08 DIAGNOSIS — Z1159 Encounter for screening for other viral diseases: Secondary | ICD-10-CM

## 2019-02-08 DIAGNOSIS — N181 Chronic kidney disease, stage 1: Secondary | ICD-10-CM

## 2019-02-08 DIAGNOSIS — E559 Vitamin D deficiency, unspecified: Secondary | ICD-10-CM

## 2019-02-08 DIAGNOSIS — F332 Major depressive disorder, recurrent severe without psychotic features: Secondary | ICD-10-CM

## 2019-02-08 DIAGNOSIS — R7301 Impaired fasting glucose: Secondary | ICD-10-CM

## 2019-02-08 LAB — BAYER DCA HB A1C WAIVED: HB A1C (BAYER DCA - WAIVED): 5.8 % (ref <7.0)

## 2019-02-08 MED ORDER — OMEPRAZOLE 20 MG PO CPDR: 20.0000 mg | DELAYED_RELEASE_CAPSULE | Freq: Every day | ORAL | 5 refills | Status: DC

## 2019-02-08 MED ORDER — QUETIAPINE FUMARATE 25 MG PO TABS: ORAL_TABLET | ORAL | 1 refills | Status: DC

## 2019-02-08 MED ORDER — BENAZEPRIL HCL 20 MG PO TABS: 20.0000 mg | ORAL_TABLET | Freq: Every day | ORAL | 1 refills | Status: DC

## 2019-02-08 MED ORDER — FLUTICASONE PROPIONATE 50 MCG/ACT NA SUSP: 2.0000 | Freq: Every day | NASAL | 12 refills | Status: DC

## 2019-02-08 NOTE — Assessment & Plan Note (Signed)
Rechecking levels today. Await results. Call with any concerns.  

## 2019-02-08 NOTE — Assessment & Plan Note (Signed)
Under good control on current regimen. Continue current regimen. Continue to monitor. Call with any concerns. Refills given.   

## 2019-02-08 NOTE — Assessment & Plan Note (Signed)
Under good control on current regimen. Continue current regimen. Continue to monitor. Call with any concerns. Refills given. Labs drawn today.   

## 2019-02-08 NOTE — Assessment & Plan Note (Signed)
Under good control with A1c of 5.8- continue current regimen. Continue to monitor. Call with any concerns.

## 2019-02-08 NOTE — Assessment & Plan Note (Signed)
Rechecking levels today. Await results. Treat as needed. Call with any concerns.  

## 2019-02-08 NOTE — Progress Notes (Signed)
BP 129/85   Pulse 74   Temp 98.7 F (37.1 C) (Oral)   Ht 5\' 2"  (1.575 m)   Wt 208 lb (94.3 kg)   SpO2 98%   BMI 38.04 kg/m    Subjective:    Patient ID: Jaime Freeman, female    DOB: 1955/08/24, 63 y.o.   MRN: [id number]  HPI: Jaime Freeman is a 63 y.o. female  Chief Complaint  Patient presents with  . Hypertension  . Hyperlipidemia  . IFG  . Depression   HYPERTENSION / HYPERLIPIDEMIA Satisfied with current treatment? yes Duration of hypertension: chronic BP monitoring frequency: not checking BP medication side effects: no Past BP meds: benazepril Duration of hyperlipidemia: chronic Cholesterol medication side effects: not on anything Cholesterol supplements: none Past cholesterol medications: none Medication compliance: excellent compliance Aspirin: yes Recent stressors: yes Recurrent headaches: no Visual changes: no Palpitations: no Dyspnea: no Chest pain: no Lower extremity edema: no Dizzy/lightheaded: no  Impaired Fasting Glucose HbA1C:  Lab Results  Component Value Date   HGBA1C 5.9 12/22/2017   Duration of elevated blood sugar: chronic Polydipsia: no Polyuria: no Weight change: no Visual disturbance: no Glucose Monitoring: no    Accucheck frequency: Not Checking Diabetic Education: Not Completed Family history of diabetes: yes  DEPRESSION Mood status: controlled Satisfied with current treatment?: yes Symptom severity: mild  Duration of current treatment : chronic Side effects: no Medication compliance: excellent compliance Psychotherapy/counseling: no  Previous psychiatric medications: seroquel Depressed mood: no Anxious mood: no Anhedonia: no Significant weight loss or gain: no Insomnia: no  Fatigue: no Feelings of worthlessness or guilt: no Impaired concentration/indecisiveness: no Suicidal ideations: no Hopelessness: no Crying spells: no Depression screen [facility] 2/9 02/08/2019 08/04/2018 12/13/2017 12/06/2017 11/04/2017   Decreased Interest 0 0 0 0 3  Down, Depressed, Hopeless 1 1 0 0 3  PHQ - 2 Score 1 1 0 0 6  Altered sleeping 1 1 0 0 3  Tired, decreased energy 0 1 0 0 3  Change in appetite 0 0 0 0 3  Feeling bad or failure about yourself  0 0 0 0 3  Trouble concentrating 1 1 0 0 3  Moving slowly or fidgety/restless 0 0 0 0 3  Suicidal thoughts 0 0 0 0 0  PHQ-9 Score 3 4 0 0 24  Difficult doing work/chores Not difficult at all Not difficult at all - - Extremely dIfficult    Relevant past medical, surgical, family and social history reviewed and updated as indicated. Interim medical history since our last visit reviewed. Allergies and medications reviewed and updated.  Review of Systems  Constitutional: Negative.   Respiratory: Negative.   Cardiovascular: Negative.   Musculoskeletal: Positive for arthralgias. Negative for back pain, gait problem, joint swelling, myalgias, neck pain and neck stiffness.  Skin: Negative.   Neurological: Negative.   Psychiatric/Behavioral: Positive for dysphoric mood and sleep disturbance. Negative for agitation, behavioral problems, confusion, decreased concentration, hallucinations, self-injury and suicidal ideas. The patient is not nervous/anxious and is not hyperactive.     Per HPI unless specifically indicated above     Objective:    BP 129/85   Pulse 74   Temp 98.7 F (37.1 C) (Oral)   Ht 5\' 2"  (1.575 m)   Wt 208 lb (94.3 kg)   SpO2 98%   BMI 38.04 kg/m   Wt Readings from Last 3 Encounters:  02/08/19 208 lb (94.3 kg)  01/11/19 208 lb (94.3 kg)  11/09/18 209 lb (94.8 kg)  Physical Exam Vitals signs and nursing note reviewed.  Constitutional:      General: She is not in acute distress.    Appearance: Normal appearance. She is not ill-appearing, toxic-appearing or diaphoretic.  HENT:     Head: Normocephalic and atraumatic.     Right Ear: External ear normal.     Left Ear: External ear normal.     Nose: Nose normal.     Mouth/Throat:      Mouth: Mucous membranes are moist.     Pharynx: Oropharynx is clear.  Eyes:     General: No scleral icterus.       Right eye: No discharge.        Left eye: No discharge.     Extraocular Movements: Extraocular movements intact.     Conjunctiva/sclera: Conjunctivae normal.     Pupils: Pupils are equal, round, and reactive to light.  Neck:     Musculoskeletal: Normal range of motion and neck supple.  Cardiovascular:     Rate and Rhythm: Normal rate and regular rhythm.     Pulses: Normal pulses.     Heart sounds: Normal heart sounds. No murmur. No friction rub. No gallop.   Pulmonary:     Effort: Pulmonary effort is normal. No respiratory distress.     Breath sounds: Normal breath sounds. No stridor. No wheezing, rhonchi or rales.  Chest:     Chest wall: No tenderness.  Musculoskeletal: Normal range of motion.  Skin:    General: Skin is warm and dry.     Capillary Refill: Capillary refill takes less than 2 seconds.     Coloration: Skin is not jaundiced or pale.     Findings: No bruising, erythema, lesion or rash.  Neurological:     General: No focal deficit present.     Mental Status: She is alert and oriented to person, place, and time. Mental status is at baseline.  Psychiatric:        Mood and Affect: Mood normal.        Behavior: Behavior normal.        Thought Content: Thought content normal.        Judgment: Judgment normal.     Results for orders placed or performed in visit on 11/09/18  UA/M w/rflx Culture, Routine   Specimen: Urine   URINE  Result Value Ref Range   Specific Gravity, UA 1.020 1.005 - 1.030   pH, UA 7.0 5.0 - 7.5   Color, UA Yellow Yellow   Appearance Ur Clear Clear   Leukocytes,UA Negative Negative   Protein,UA Negative Negative/Trace   Glucose, UA Negative Negative   Ketones, UA Negative Negative   RBC, UA Negative Negative   Bilirubin, UA Negative Negative   Urobilinogen, Ur 0.2 0.2 - 1.0 mg/dL   Nitrite, UA Negative Negative       Assessment & Plan:   Problem List Items Addressed This Visit      Digestive   Fatty liver    Rechecking levels today. Await results. Call with any concerns.       Relevant Orders   CBC with Differential/Platelet   Comprehensive metabolic panel   TSH     Endocrine   IFG (impaired fasting glucose) - Primary    Under good control with A1c of 5.8- continue current regimen. Continue to monitor. Call with any concerns.       Relevant Orders   Bayer DCA Hb A1c Waived   CBC with Differential/Platelet   Comprehensive  metabolic panel   TSH     Genitourinary   Benign hypertensive renal disease    Under good control on current regimen. Continue current regimen. Continue to monitor. Call with any concerns. Refills given. Labs drawn today.       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 Await results. Call with any concerns.       Relevant Orders   CBC with Differential/Platelet   Comprehensive metabolic panel   TSH   UA/M w/rflx Culture, Routine     Other   Vitamin D deficiency disease    Rechecking levels today. Await results. Call with any concerns.       Relevant Orders   CBC with Differential/Platelet   Comprehensive metabolic panel   TSH   VITAMIN D 25 Hydroxy (Vit-D Deficiency, Fractures)   Depression    Under good control on current regimen. Continue current regimen. Continue to monitor. Call with any concerns. Refills given.        Relevant Medications   QUEtiapine (SEROQUEL) 25 MG tablet   Other Relevant Orders   CBC with Differential/Platelet   Comprehensive metabolic panel   TSH   Anxiety    Under good control on current regimen. Continue current regimen. Continue to monitor. Call with any concerns. Refills given.        Relevant Orders   CBC with Differential/Platelet    Comprehensive metabolic panel   TSH   Dyslipidemia    Rechecking levels today. Await results. Treat as needed. Call with any concerns.       Relevant Orders   CBC with Differential/Platelet   Comprehensive metabolic panel   Lipid Panel w/o Chol/HDL Ratio   TSH    Other Visit Diagnoses    Need for hepatitis C screening test       Labs drawn today. Await results.    Relevant Orders   Hepatitis C antibody       Follow up plan: Return in about 6 months (around 08/09/2019).

## 2019-02-09 ENCOUNTER — Encounter: Payer: Self-pay | Admitting: Family Medicine

## 2019-02-09 LAB — CBC WITH DIFFERENTIAL/PLATELET
Basophils Absolute: 0.1 10*3/uL (ref 0.0–0.2)
Basos: 1 %
EOS (ABSOLUTE): 0.2 10*3/uL (ref 0.0–0.4)
Eos: 2 %
Hematocrit: 36.2 % (ref 34.0–46.6)
Hemoglobin: 12.8 g/dL (ref 11.1–15.9)
Immature Grans (Abs): 0 10*3/uL (ref 0.0–0.1)
Immature Granulocytes: 0 %
Lymphocytes Absolute: 3.1 10*3/uL (ref 0.7–3.1)
Lymphs: 35 %
MCH: 32.3 pg (ref 26.6–33.0)
MCHC: 35.4 g/dL (ref 31.5–35.7)
MCV: 91 fL (ref 79–97)
Monocytes Absolute: 0.5 10*3/uL (ref 0.1–0.9)
Monocytes: 6 %
Neutrophils Absolute: 5.1 10*3/uL (ref 1.4–7.0)
Neutrophils: 56 %
Platelets: 337 10*3/uL (ref 150–450)
RBC: 3.96 x10E6/uL (ref 3.77–5.28)
RDW: 11.6 % — ABNORMAL LOW (ref 11.7–15.4)
WBC: 9 10*3/uL (ref 3.4–10.8)

## 2019-02-09 LAB — COMPREHENSIVE METABOLIC PANEL
ALT: 12 IU/L (ref 0–32)
AST: 18 IU/L (ref 0–40)
Albumin/Globulin Ratio: 1.7 (ref 1.2–2.2)
Albumin: 4.2 g/dL (ref 3.8–4.8)
Alkaline Phosphatase: 77 IU/L (ref 39–117)
BUN/Creatinine Ratio: 19 (ref 12–28)
BUN: 15 mg/dL (ref 8–27)
Bilirubin Total: 0.3 mg/dL (ref 0.0–1.2)
CO2: 23 mmol/L (ref 20–29)
Calcium: 9.9 mg/dL (ref 8.7–10.3)
Chloride: 103 mmol/L (ref 96–106)
Creatinine, Ser: 0.77 mg/dL (ref 0.57–1.00)
GFR calc Af Amer: 96 mL/min/{1.73_m2} (ref >59)
GFR calc non Af Amer: 83 mL/min/{1.73_m2} (ref >59)
Globulin, Total: 2.5 g/dL (ref 1.5–4.5)
Glucose: 95 mg/dL (ref 65–99)
Potassium: 4.6 mmol/L (ref 3.5–5.2)
Sodium: 139 mmol/L (ref 134–144)
Total Protein: 6.7 g/dL (ref 6.0–8.5)

## 2019-02-09 LAB — TSH: TSH: 0.47 u[IU]/mL (ref 0.450–4.500)

## 2019-02-09 LAB — HEPATITIS C ANTIBODY: Hep C Virus Ab: <0.1 s/co ratio (ref 0.0–0.9)

## 2019-02-09 LAB — LIPID PANEL W/O CHOL/HDL RATIO
Cholesterol, Total: 218 mg/dL — ABNORMAL HIGH (ref 100–199)
HDL: 64 mg/dL (ref >39)
LDL Chol Calc (NIH): 132 mg/dL — ABNORMAL HIGH (ref 0–99)
Triglycerides: 124 mg/dL (ref 0–149)
VLDL Cholesterol Cal: 22 mg/dL (ref 5–40)

## 2019-02-09 LAB — VITAMIN D 25 HYDROXY (VIT D DEFICIENCY, FRACTURES): Vit D, 25-Hydroxy: 33.1 ng/mL (ref 30.0–100.0)

## 2019-02-27 ENCOUNTER — Telehealth: Payer: Self-pay | Admitting: Family Medicine

## 2019-02-27 NOTE — Telephone Encounter (Signed)
Pt called requesting to change directions for Seroquel, she nods off during the day. The night time is great however, she wants to either take a half of half or to stop the daytime dosage altogether. Please advise Best contact: [phone]

## 2019-02-28 NOTE — Telephone Encounter (Signed)
Called patient. Left VM for patient to return call to the office. 

## 2019-02-28 NOTE — Telephone Encounter (Signed)
It's already written as a half of a tab as needed during the day- she does not have to take it unless she needs it during the day.

## 2019-02-28 NOTE — Telephone Encounter (Signed)
Routing to provider  

## 2019-03-01 NOTE — Telephone Encounter (Signed)
Patient notified of Dr. Johnson's message.  °

## 2019-04-16 ENCOUNTER — Telehealth: Payer: Self-pay | Admitting: Family Medicine

## 2019-04-16 NOTE — Telephone Encounter (Signed)
She can take sudafed, mucinex, allegra and use nasal rinses. If she's feeling better after Christmas, we'll see her and make sure she doesn't have a sinus infection.

## 2019-04-16 NOTE — Telephone Encounter (Signed)
Copied from [place] [id number]. Topic: General - Other >> Apr 13, 2019  4:08 PM Celene Kras wrote: Reason for CRM: Pt called stating she is having sinus issues and a dry cough. Pt was offered to set up an appt and she denies. Pt is requesting to have a call back. Please advise. >> Apr 16, 2019  8:35 AM Jill Side wrote: Called pt to see about scheduling virtual, she does not want to do so at this time states that she is just wanting to know what she can take over the counter to dry up the sinus drainage. She states that she took a covid test and it was neg. She doesn't want to incur debt states that she can not afford it. Please advise.

## 2019-04-16 NOTE — Telephone Encounter (Signed)
Called and spoke with patient. I let her know Dr. Durenda Age suggestions and patient states she cannot do any of those because she cannot take antihistamines. Scheduled patient a virtual visit with Dr. Wynetta Emery tomorrow morning.

## 2019-04-17 ENCOUNTER — Encounter: Payer: Self-pay | Admitting: Family Medicine

## 2019-04-17 ENCOUNTER — Other Ambulatory Visit: Payer: Self-pay

## 2019-04-17 ENCOUNTER — Ambulatory Visit (INDEPENDENT_AMBULATORY_CARE_PROVIDER_SITE_OTHER): Payer: Self-pay | Admitting: Family Medicine

## 2019-04-17 VITALS — BP 132/68 | HR 64 | Temp 96.9°F

## 2019-04-17 DIAGNOSIS — B379 Candidiasis, unspecified: Secondary | ICD-10-CM

## 2019-04-17 DIAGNOSIS — J0100 Acute maxillary sinusitis, unspecified: Secondary | ICD-10-CM

## 2019-04-17 MED ORDER — AMOXICILLIN-POT CLAVULANATE 875-125 MG PO TABS: 1.0000 | ORAL_TABLET | Freq: Two times a day (BID) | ORAL | 0 refills | Status: DC

## 2019-04-17 MED ORDER — FLUCONAZOLE 150 MG PO TABS: 150.0000 mg | ORAL_TABLET | Freq: Every day | ORAL | 0 refills | Status: DC

## 2019-04-17 NOTE — Progress Notes (Signed)
BP 132/68   Pulse 64   Temp (!) 96.9 F (36.1 C)    Subjective:    Patient ID: Jaime Freeman, female    DOB: Oct 06, 1955, 63 y.o.   MRN: [id number]  HPI: Jaime Freeman is a 63 y.o. female  Chief Complaint  Patient presents with  . URI    Stuffy nose and throat, some in upper chest, cough   UPPER RESPIRATORY TRACT INFECTION Duration: 2 weeks Worst symptom: PND and congestion  Fever: no Cough: no Shortness of breath: no Wheezing: yes Chest pain: no Chest tightness: yes Chest congestion: yes Nasal congestion: no Runny nose: no Post nasal drip: no Sneezing: no Sore throat: no Swollen glands: no Sinus pressure: yes Headache: no Face pain: no Toothache: no Ear pain: no  Ear pressure: no  Eyes red/itching:no Eye drainage/crusting: no  Vomiting: no Rash: no Fatigue: yes Sick contacts: yes- COVID exposure 3 weeks ago, test negative last week Strep contacts: no  Context: worse Recurrent sinusitis: no Relief with OTC cold/cough medications: no  Treatments attempted: cold/sinus, mucinex and anti-histamine   VAGINAL DISCHARGE Duration: couple of weeks Discharge description: white  Pruritus: yes Dysuria: no Malodorous: no Urinary frequency: no Fevers: no Abdominal pain: no  Sexual activity: not sexually active History of sexually transmitted diseases: no Recent antibiotic use: no Context: stable  Treatments attempted: none  Relevant past medical, surgical, family and social history reviewed and updated as indicated. Interim medical history since our last visit reviewed. Allergies and medications reviewed and updated.  Review of Systems  Constitutional: Positive for chills, diaphoresis and fatigue. Negative for activity change, appetite change, fever and unexpected weight change.  HENT: Positive for congestion, postnasal drip and sinus pressure. Negative for dental problem, drooling, ear discharge, ear pain, facial swelling, hearing loss, mouth sores,  nosebleeds, rhinorrhea, sinus pain, sneezing, sore throat, tinnitus, trouble swallowing and voice change.   Eyes: Negative.   Respiratory: Positive for chest tightness and wheezing. Negative for apnea, cough, choking, shortness of breath and stridor.   Cardiovascular: Negative.   Gastrointestinal: Negative.   Neurological: Negative.   Psychiatric/Behavioral: Negative.     Per HPI unless specifically indicated above     Objective:    BP 132/68   Pulse 64   Temp (!) 96.9 F (36.1 C)   Wt Readings from Last 3 Encounters:  02/08/19 208 lb (94.3 kg)  01/11/19 208 lb (94.3 kg)  11/09/18 209 lb (94.8 kg)    Physical Exam Vitals and nursing note reviewed.  Constitutional:      General: She is not in acute distress.    Appearance: Normal appearance. She is not ill-appearing, toxic-appearing or diaphoretic.  HENT:     Head: Normocephalic and atraumatic.     Right Ear: External ear normal.     Left Ear: External ear normal.     Nose: Nose normal.     Mouth/Throat:     Mouth: Mucous membranes are moist.     Pharynx: Oropharynx is clear.  Eyes:     General: No scleral icterus.       Right eye: No discharge.        Left eye: No discharge.     Conjunctiva/sclera: Conjunctivae normal.     Pupils: Pupils are equal, round, and reactive to light.  Pulmonary:     Effort: Pulmonary effort is normal. No respiratory distress.     Comments: Speaking in full sentences Musculoskeletal:        General: Normal  range of motion.     Cervical back: Normal range of motion.  Skin:    Coloration: Skin is not jaundiced or pale.     Findings: No bruising, erythema, lesion or rash.  Neurological:     Mental Status: She is alert and oriented to person, place, and time. Mental status is at baseline.  Psychiatric:        Mood and Affect: Mood normal.        Behavior: Behavior normal.        Thought Content: Thought content normal.        Judgment: Judgment normal.     Results for orders placed  or performed in visit on 02/08/19  Bayer DCA Hb A1c Waived  Result Value Ref Range   HB A1C (BAYER DCA - WAIVED) 5.8 <7.0 %  CBC with Differential/Platelet  Result Value Ref Range   WBC 9.0 3.4 - 10.8 x10E3/uL   RBC 3.96 3.77 - 5.28 x10E6/uL   Hemoglobin 12.8 11.1 - 15.9 g/dL   Hematocrit 36.2 34.0 - 46.6 %   MCV 91 79 - 97 fL   MCH 32.3 26.6 - 33.0 pg   MCHC 35.4 31.5 - 35.7 g/dL   RDW 11.6 (L) 11.7 - 15.4 %   Platelets 337 150 - 450 x10E3/uL   Neutrophils 56 Not Estab. %   Lymphs 35 Not Estab. %   Monocytes 6 Not Estab. %   Eos 2 Not Estab. %   Basos 1 Not Estab. %   Neutrophils Absolute 5.1 1.4 - 7.0 x10E3/uL   Lymphocytes Absolute 3.1 0.7 - 3.1 x10E3/uL   Monocytes Absolute 0.5 0.1 - 0.9 x10E3/uL   EOS (ABSOLUTE) 0.2 0.0 - 0.4 x10E3/uL   Basophils Absolute 0.1 0.0 - 0.2 x10E3/uL   Immature Granulocytes 0 Not Estab. %   Immature Grans (Abs) 0.0 0.0 - 0.1 x10E3/uL  Comprehensive metabolic panel  Result Value Ref Range   Glucose 95 65 - 99 mg/dL   BUN 15 8 - 27 mg/dL   Creatinine, Ser 0.77 0.57 - 1.00 mg/dL   GFR calc non Af Amer 83 >59 mL/min/1.73   GFR calc Af Amer 96 >59 mL/min/1.73   BUN/Creatinine Ratio 19 12 - 28   Sodium 139 134 - 144 mmol/L   Potassium 4.6 3.5 - 5.2 mmol/L   Chloride 103 96 - 106 mmol/L   CO2 23 20 - 29 mmol/L   Calcium 9.9 8.7 - 10.3 mg/dL   Total Protein 6.7 6.0 - 8.5 g/dL   Albumin 4.2 3.8 - 4.8 g/dL   Globulin, Total 2.5 1.5 - 4.5 g/dL   Albumin/Globulin Ratio 1.7 1.2 - 2.2   Bilirubin Total 0.3 0.0 - 1.2 mg/dL   Alkaline Phosphatase 77 39 - 117 IU/L   AST 18 0 - 40 IU/L   ALT 12 0 - 32 IU/L  Lipid Panel w/o Chol/HDL Ratio  Result Value Ref Range   Cholesterol, Total 218 (H) 100 - 199 mg/dL   Triglycerides 124 0 - 149 mg/dL   HDL 64 >39 mg/dL   VLDL Cholesterol Cal 22 5 - 40 mg/dL   LDL Chol Calc (NIH) 132 (H) 0 - 99 mg/dL  TSH  Result Value Ref Range   TSH 0.470 0.450 - 4.500 uIU/mL  VITAMIN D 25 Hydroxy (Vit-D Deficiency,  Fractures)  Result Value Ref Range   Vit D, 25-Hydroxy 33.1 30.0 - 100.0 ng/mL  Hepatitis C antibody  Result Value Ref Range   Hep C  Virus Ab <0.1 0.0 - 0.9 s/co ratio      Assessment & Plan:   Problem List Items Addressed This Visit    None    Visit Diagnoses    Acute non-recurrent maxillary sinusitis    -  Primary   Negative COVID last week. Will treat with augmentin. Call if not getting better or getting worse.    Relevant Medications   amoxicillin-clavulanate (AUGMENTIN) 875-125 MG tablet   fluconazole (DIFLUCAN) 150 MG tablet   Yeast infection       Will treat with diflucan. Call if not getting better or getting worse.    Relevant Medications   fluconazole (DIFLUCAN) 150 MG tablet       Follow up plan: Return if symptoms worsen or fail to improve.    . This visit was completed via Doximity due to the restrictions of the COVID-19 pandemic. All issues as above were discussed and addressed. Physical exam was done as above through visual confirmation on Doximity. If it was felt that the patient should be evaluated in the office, they were directed there. The patient verbally consented to this visit. . Location of the patient: home . Location of the provider: work . Those involved with this call:  . Provider: Park Liter, DO . CMA: Tiffany Reel, CMA . Front Desk/Registration: Don Perking  . Time spent on call: 25 minutes with patient face to face via video conference. More than 50% of this time was spent in counseling and coordination of care. 40 minutes total spent in review of patient's record and preparation of their chart.

## 2019-05-01 ENCOUNTER — Other Ambulatory Visit: Payer: Self-pay | Admitting: Family Medicine

## 2019-05-01 NOTE — Telephone Encounter (Signed)
Pt states she was to call back and let Dr Wynetta Emery know how she is. Pt states she believes the sinus infection has turned into almost like bronchitis.  Pt states it is in her throat and going to her chest.  Pt has finished medicine 3 days now. And not getting better. No fever. Pt has productive cough, and it was yellow phlegm.  Pt reports using inhaler as well.   Pt wants to know if she can take Robitussin?  Pt does not want tessalon pearls or codeine cough med.  Head seems to be very dried out.  [place], [place] Phone:  [phone]  Fax:  [phone]

## 2019-05-01 NOTE — Telephone Encounter (Signed)
She can definitely take robutussin. If she's not getting better, let me know and we'll see her

## 2019-05-01 NOTE — Telephone Encounter (Signed)
Patient notified

## 2019-05-01 NOTE — Telephone Encounter (Signed)
Routing to provider  

## 2019-05-08 ENCOUNTER — Ambulatory Visit: Payer: Self-pay | Admitting: Family Medicine

## 2019-05-15 ENCOUNTER — Telehealth: Payer: Self-pay | Admitting: Family Medicine

## 2019-05-15 NOTE — Telephone Encounter (Signed)
Pt presented in office stating that she still has a cough and has had a sinus infection. Pt states that she went to [facility] to have a covid test done and it was neg. She states that she was told that if it continues that she would need to be seen in office. Scheduled virtual appt for tomorrow. Please advise.

## 2019-05-16 ENCOUNTER — Ambulatory Visit (INDEPENDENT_AMBULATORY_CARE_PROVIDER_SITE_OTHER): Payer: Self-pay | Admitting: Family Medicine

## 2019-05-16 ENCOUNTER — Encounter: Payer: Self-pay | Admitting: Family Medicine

## 2019-05-16 ENCOUNTER — Other Ambulatory Visit: Payer: Self-pay

## 2019-05-16 VITALS — BP 128/88 | HR 80 | Wt 204.0 lb

## 2019-05-16 DIAGNOSIS — R059 Cough, unspecified: Secondary | ICD-10-CM

## 2019-05-16 DIAGNOSIS — R05 Cough: Secondary | ICD-10-CM

## 2019-05-16 MED ORDER — FEXOFENADINE HCL 180 MG PO TABS: 180.0000 mg | ORAL_TABLET | Freq: Every day | ORAL | 1 refills | Status: DC

## 2019-05-16 MED ORDER — ALBUTEROL SULFATE (2.5 MG/3ML) 0.083% IN NEBU: 2.5000 mg | INHALATION_SOLUTION | Freq: Four times a day (QID) | RESPIRATORY_TRACT | 1 refills | Status: DC | PRN

## 2019-05-16 MED ORDER — BUDESONIDE-FORMOTEROL FUMARATE 160-4.5 MCG/ACT IN AERO: 2.0000 | INHALATION_SPRAY | Freq: Two times a day (BID) | RESPIRATORY_TRACT | 3 refills | Status: DC

## 2019-05-16 NOTE — Progress Notes (Signed)
BP 128/88   Pulse 80   Wt 204 lb (92.5 kg)   BMI 37.31 kg/m    Subjective:    Patient ID: Jaime Freeman, female    DOB: [birth year], 64 y.o.   MRN: [id number]  HPI: Jaime Freeman is a 64 y.o. female  Chief Complaint  Patient presents with  . Cough    nasal congestion   UPPER RESPIRATORY TRACT INFECTION- went to [facility] 2 weeks ago and she was negative Duration: 1 week Worst symptom: cough, stuffy nose Fever: no Cough: yes- at night Shortness of breath: no Wheezing: no Chest pain: no Chest tightness: no Chest congestion: no Nasal congestion: yes Runny nose: yes Post nasal drip: yes Sneezing: no Sore throat: no Swollen glands: no Sinus pressure: no Headache: no Face pain: no Toothache: no Ear pain: no  Ear pressure: no  Eyes red/itching:no Eye drainage/crusting: no  Vomiting: no Rash: no Fatigue: yes Sick contacts: no Strep contacts: no  Context: stable Recurrent sinusitis: no Relief with OTC cold/cough medications: no  Treatments attempted: cold/sinus, mucinex and cough syrup   Relevant past medical, surgical, family and social history reviewed and updated as indicated. Interim medical history since our last visit reviewed. Allergies and medications reviewed and updated.  Review of Systems  Constitutional: Negative.   HENT: Positive for congestion, postnasal drip and rhinorrhea. Negative for dental problem, drooling, ear discharge, ear pain, facial swelling, hearing loss, mouth sores, nosebleeds, sinus pressure, sinus pain, sneezing, sore throat, tinnitus, trouble swallowing and voice change.   Respiratory: Positive for cough. Negative for apnea, choking, chest tightness, shortness of breath, wheezing and stridor.   Cardiovascular: Negative.   Gastrointestinal: Negative.   Psychiatric/Behavioral: Negative.     Per HPI unless specifically indicated above     Objective:    BP 128/88   Pulse 80   Wt 204 lb (92.5 kg)   BMI 37.31 kg/m   Wt Readings  from Last 3 Encounters:  05/16/19 204 lb (92.5 kg)  02/08/19 208 lb (94.3 kg)  01/11/19 208 lb (94.3 kg)    Physical Exam Vitals and nursing note reviewed.  Constitutional:      General: She is not in acute distress.    Appearance: Normal appearance. She is not ill-appearing, toxic-appearing or diaphoretic.  HENT:     Head: Normocephalic and atraumatic.     Right Ear: External ear normal.     Left Ear: External ear normal.     Nose: Nose normal.     Mouth/Throat:     Mouth: Mucous membranes are moist.     Pharynx: Oropharynx is clear.  Eyes:     General: No scleral icterus.       Right eye: No discharge.        Left eye: No discharge.     Conjunctiva/sclera: Conjunctivae normal.     Pupils: Pupils are equal, round, and reactive to light.  Pulmonary:     Effort: Pulmonary effort is normal. No respiratory distress.     Comments: Speaking in full sentences Musculoskeletal:        General: Normal range of motion.     Cervical back: Normal range of motion.  Skin:    Coloration: Skin is not jaundiced or pale.     Findings: No bruising, erythema, lesion or rash.  Neurological:     Mental Status: She is alert and oriented to person, place, and time. Mental status is at baseline.  Psychiatric:  Mood and Affect: Mood normal.        Behavior: Behavior normal.        Thought Content: Thought content normal.        Judgment: Judgment normal.     Results for orders placed or performed in visit on 02/08/19  Bayer DCA Hb A1c Waived  Result Value Ref Range   HB A1C (BAYER DCA - WAIVED) 5.8 <7.0 %  CBC with Differential/Platelet  Result Value Ref Range   WBC 9.0 3.4 - 10.8 x10E3/uL   RBC 3.96 3.77 - 5.28 x10E6/uL   Hemoglobin 12.8 11.1 - 15.9 g/dL   Hematocrit 36.2 34.0 - 46.6 %   MCV 91 79 - 97 fL   MCH 32.3 26.6 - 33.0 pg   MCHC 35.4 31.5 - 35.7 g/dL   RDW 11.6 (L) 11.7 - 15.4 %   Platelets 337 150 - 450 x10E3/uL   Neutrophils 56 Not Estab. %   Lymphs 35 Not Estab. %     Monocytes 6 Not Estab. %   Eos 2 Not Estab. %   Basos 1 Not Estab. %   Neutrophils Absolute 5.1 1.4 - 7.0 x10E3/uL   Lymphocytes Absolute 3.1 0.7 - 3.1 x10E3/uL   Monocytes Absolute 0.5 0.1 - 0.9 x10E3/uL   EOS (ABSOLUTE) 0.2 0.0 - 0.4 x10E3/uL   Basophils Absolute 0.1 0.0 - 0.2 x10E3/uL   Immature Granulocytes 0 Not Estab. %   Immature Grans (Abs) 0.0 0.0 - 0.1 x10E3/uL  Comprehensive metabolic panel  Result Value Ref Range   Glucose 95 65 - 99 mg/dL   BUN 15 8 - 27 mg/dL   Creatinine, Ser 0.77 0.57 - 1.00 mg/dL   GFR calc non Af Amer 83 >59 mL/min/1.73   GFR calc Af Amer 96 >59 mL/min/1.73   BUN/Creatinine Ratio 19 12 - 28   Sodium 139 134 - 144 mmol/L   Potassium 4.6 3.5 - 5.2 mmol/L   Chloride 103 96 - 106 mmol/L   CO2 23 20 - 29 mmol/L   Calcium 9.9 8.7 - 10.3 mg/dL   Total Protein 6.7 6.0 - 8.5 g/dL   Albumin 4.2 3.8 - 4.8 g/dL   Globulin, Total 2.5 1.5 - 4.5 g/dL   Albumin/Globulin Ratio 1.7 1.2 - 2.2   Bilirubin Total 0.3 0.0 - 1.2 mg/dL   Alkaline Phosphatase 77 39 - 117 IU/L   AST 18 0 - 40 IU/L   ALT 12 0 - 32 IU/L  Lipid Panel w/o Chol/HDL Ratio  Result Value Ref Range   Cholesterol, Total 218 (H) 100 - 199 mg/dL   Triglycerides 124 0 - 149 mg/dL   HDL 64 >39 mg/dL   VLDL Cholesterol Cal 22 5 - 40 mg/dL   LDL Chol Calc (NIH) 132 (H) 0 - 99 mg/dL  TSH  Result Value Ref Range   TSH 0.470 0.450 - 4.500 uIU/mL  VITAMIN D 25 Hydroxy (Vit-D Deficiency, Fractures)  Result Value Ref Range   Vit D, 25-Hydroxy 33.1 30.0 - 100.0 ng/mL  Hepatitis C antibody  Result Value Ref Range   Hep C Virus Ab <0.1 0.0 - 0.9 s/co ratio      Assessment & Plan:   Problem List Items Addressed This Visit    None    Visit Diagnoses    Cough    -  Primary   Will start inhaler and and allegra. Continue nebulizer. Call with any concerns. Continue to monitor.        Follow  up plan: Return if symptoms worsen or fail to improve.   . This visit was completed via Doximity due  to the restrictions of the COVID-19 pandemic. All issues as above were discussed and addressed. Physical exam was done as above through visual confirmation on Doximity. If it was felt that the patient should be evaluated in the office, they were directed there. The patient verbally consented to this visit. . Location of the patient: home . Location of the provider: home . Those involved with this call:  . Provider: Park Liter, DO . CMA: Tiffany Reel, CMA . Front Desk/Registration: Don Perking  . Time spent on call: 15 minutes with patient face to face via video conference. More than 50% of this time was spent in counseling and coordination of care. 23 minutes total spent in review of patient's record and preparation of their chart.

## 2019-05-16 NOTE — Telephone Encounter (Signed)
Seen today. 

## 2019-05-28 ENCOUNTER — Encounter: Payer: Self-pay | Admitting: Family Medicine

## 2019-05-28 ENCOUNTER — Telehealth: Payer: Self-pay | Admitting: Family Medicine

## 2019-05-28 ENCOUNTER — Ambulatory Visit (INDEPENDENT_AMBULATORY_CARE_PROVIDER_SITE_OTHER): Payer: Self-pay | Admitting: Family Medicine

## 2019-05-28 VITALS — BP 125/68 | HR 79 | Temp 97.2°F

## 2019-05-28 DIAGNOSIS — J0100 Acute maxillary sinusitis, unspecified: Secondary | ICD-10-CM

## 2019-05-28 MED ORDER — DOXYCYCLINE HYCLATE 100 MG PO TABS: 100.0000 mg | ORAL_TABLET | Freq: Two times a day (BID) | ORAL | 0 refills | Status: DC

## 2019-05-28 MED ORDER — LEVOFLOXACIN 750 MG PO TABS: 750.0000 mg | ORAL_TABLET | Freq: Every day | ORAL | 0 refills | Status: DC

## 2019-05-28 NOTE — Telephone Encounter (Signed)
She took augmentin and did not get better. Can we find out what happened with doxy- did she have an allergic reaction?

## 2019-05-28 NOTE — Telephone Encounter (Signed)
Autumn called from [place] called to report that pt is requesting Augmentin instead of doxycyline. Pt is experiencing shallow breathing and chest tightness after taking medication 1 hr ago. Please advise

## 2019-05-28 NOTE — Addendum Note (Signed)
Addended by: Valerie Roys on: 05/28/2019 04:52 PM   Modules accepted: Orders

## 2019-05-28 NOTE — Telephone Encounter (Signed)
Verbal from Dr.Johnson that she needs to go to Urgent Care. Patient agreed.

## 2019-05-28 NOTE — Telephone Encounter (Signed)
Will change to levaquin, but with sudden onset of severe fatigue, chest tightness, "heart not feeling right' and SOB- patient needs to go to UC/ER tonight as all of those are signs of heart attack in women. Needs EKG

## 2019-05-28 NOTE — Telephone Encounter (Signed)
Pt verbalized understanding.

## 2019-05-28 NOTE — Telephone Encounter (Signed)
Routing to provider  

## 2019-05-28 NOTE — Telephone Encounter (Signed)
Copied from [place] [id number]. Topic: General - Inquiry >> May 28, 2019  8:42 AM Virl Axe D wrote: Reason for CRM: Pt is scheduled for virtual at 9:45am this morning. She would like to come into the office instead of doing a virtual. Pt did answer Yes for nose discomfort due to having issues to with sinuses. Pt would like to know if Dr. Wynetta Emery will see her in office and requesting callback. Stated she has been tested twice and was negative for Covid 19.

## 2019-05-28 NOTE — Progress Notes (Signed)
BP 125/68   Pulse 79   Temp (!) 97.2 F (36.2 C)    Subjective:    Patient ID: Jaime Freeman, female    DOB: 1955/06/17, 64 y.o.   MRN: [id number]  HPI: Jaime Freeman is a 64 y.o. female  Chief Complaint  Patient presents with  . Nasal Congestion   UPPER RESPIRATORY TRACT INFECTION- throat and chest is a lot better Duration: 3 weeks Worst symptom: nasal congestion Fever: no Cough: no Shortness of breath: no Wheezing: no Chest pain: no Chest tightness: no Chest congestion: no Nasal congestion: yes Runny nose: yes Post nasal drip: yes Sneezing: no Sore throat: yes Swollen glands: no Sinus pressure: yes Headache: yes Face pain: yes Toothache: no Ear pain: no  Ear pressure: no  Eyes red/itching:no Eye drainage/crusting: no  Vomiting: no Rash: no Fatigue: yes Sick contacts: no Strep contacts: no  Context: stable Recurrent sinusitis: no Relief with OTC cold/cough medications: no  Treatments attempted: cold/sinus, mucinex, anti-histamine, pseudoephedrine and antibiotics    Relevant past medical, surgical, family and social history reviewed and updated as indicated. Interim medical history since our last visit reviewed. Allergies and medications reviewed and updated.  Review of Systems  Constitutional: Negative.   HENT: Positive for congestion, postnasal drip and sinus pressure. Negative for dental problem, drooling, ear discharge, ear pain, facial swelling, hearing loss, mouth sores, nosebleeds, rhinorrhea, sinus pain, sneezing, sore throat, tinnitus, trouble swallowing and voice change.   Eyes: Negative.   Respiratory: Negative.   Cardiovascular: Negative.   Psychiatric/Behavioral: Negative.     Per HPI unless specifically indicated above     Objective:    BP 125/68   Pulse 79   Temp (!) 97.2 F (36.2 C)   Wt Readings from Last 3 Encounters:  05/16/19 204 lb (92.5 kg)  02/08/19 208 lb (94.3 kg)  01/11/19 208 lb (94.3 kg)    Physical  Exam Vitals and nursing note reviewed.  Constitutional:      General: She is not in acute distress.    Appearance: Normal appearance. She is not ill-appearing, toxic-appearing or diaphoretic.  HENT:     Head: Normocephalic and atraumatic.     Right Ear: External ear normal.     Left Ear: External ear normal.     Nose: Nose normal.     Mouth/Throat:     Mouth: Mucous membranes are moist.     Pharynx: Oropharynx is clear.  Eyes:     General: No scleral icterus.       Right eye: No discharge.        Left eye: No discharge.     Conjunctiva/sclera: Conjunctivae normal.     Pupils: Pupils are equal, round, and reactive to light.  Pulmonary:     Effort: Pulmonary effort is normal. No respiratory distress.     Comments: Speaking in full sentences Musculoskeletal:        General: Normal range of motion.     Cervical back: Normal range of motion.  Skin:    Coloration: Skin is not jaundiced or pale.     Findings: No bruising, erythema, lesion or rash.  Neurological:     Mental Status: She is alert and oriented to person, place, and time. Mental status is at baseline.  Psychiatric:        Mood and Affect: Mood normal.        Behavior: Behavior normal.        Thought Content: Thought content normal.  Judgment: Judgment normal.     Results for orders placed or performed in visit on 02/08/19  Bayer DCA Hb A1c Waived  Result Value Ref Range   HB A1C (BAYER DCA - WAIVED) 5.8 <7.0 %  CBC with Differential/Platelet  Result Value Ref Range   WBC 9.0 3.4 - 10.8 x10E3/uL   RBC 3.96 3.77 - 5.28 x10E6/uL   Hemoglobin 12.8 11.1 - 15.9 g/dL   Hematocrit 36.2 34.0 - 46.6 %   MCV 91 79 - 97 fL   MCH 32.3 26.6 - 33.0 pg   MCHC 35.4 31.5 - 35.7 g/dL   RDW 11.6 (L) 11.7 - 15.4 %   Platelets 337 150 - 450 x10E3/uL   Neutrophils 56 Not Estab. %   Lymphs 35 Not Estab. %   Monocytes 6 Not Estab. %   Eos 2 Not Estab. %   Basos 1 Not Estab. %   Neutrophils Absolute 5.1 1.4 - 7.0 x10E3/uL    Lymphocytes Absolute 3.1 0.7 - 3.1 x10E3/uL   Monocytes Absolute 0.5 0.1 - 0.9 x10E3/uL   EOS (ABSOLUTE) 0.2 0.0 - 0.4 x10E3/uL   Basophils Absolute 0.1 0.0 - 0.2 x10E3/uL   Immature Granulocytes 0 Not Estab. %   Immature Grans (Abs) 0.0 0.0 - 0.1 x10E3/uL  Comprehensive metabolic panel  Result Value Ref Range   Glucose 95 65 - 99 mg/dL   BUN 15 8 - 27 mg/dL   Creatinine, Ser 0.77 0.57 - 1.00 mg/dL   GFR calc non Af Amer 83 >59 mL/min/1.73   GFR calc Af Amer 96 >59 mL/min/1.73   BUN/Creatinine Ratio 19 12 - 28   Sodium 139 134 - 144 mmol/L   Potassium 4.6 3.5 - 5.2 mmol/L   Chloride 103 96 - 106 mmol/L   CO2 23 20 - 29 mmol/L   Calcium 9.9 8.7 - 10.3 mg/dL   Total Protein 6.7 6.0 - 8.5 g/dL   Albumin 4.2 3.8 - 4.8 g/dL   Globulin, Total 2.5 1.5 - 4.5 g/dL   Albumin/Globulin Ratio 1.7 1.2 - 2.2   Bilirubin Total 0.3 0.0 - 1.2 mg/dL   Alkaline Phosphatase 77 39 - 117 IU/L   AST 18 0 - 40 IU/L   ALT 12 0 - 32 IU/L  Lipid Panel w/o Chol/HDL Ratio  Result Value Ref Range   Cholesterol, Total 218 (H) 100 - 199 mg/dL   Triglycerides 124 0 - 149 mg/dL   HDL 64 >39 mg/dL   VLDL Cholesterol Cal 22 5 - 40 mg/dL   LDL Chol Calc (NIH) 132 (H) 0 - 99 mg/dL  TSH  Result Value Ref Range   TSH 0.470 0.450 - 4.500 uIU/mL  VITAMIN D 25 Hydroxy (Vit-D Deficiency, Fractures)  Result Value Ref Range   Vit D, 25-Hydroxy 33.1 30.0 - 100.0 ng/mL  Hepatitis C antibody  Result Value Ref Range   Hep C Virus Ab <0.1 0.0 - 0.9 s/co ratio      Assessment & Plan:   Problem List Items Addressed This Visit    None    Visit Diagnoses    Acute non-recurrent maxillary sinusitis    -  Primary   Will treat with doxycycline. Call if not getting better or getting worse. Conitnue to monitor.    Relevant Medications   doxycycline (VIBRA-TABS) 100 MG tablet       Follow up plan: Return As scheduled.   . This visit was completed via Doximity due to the restrictions of the COVID-19  pandemic. All  issues as above were discussed and addressed. Physical exam was done as above through visual confirmation on Doximity. If it was felt that the patient should be evaluated in the office, they were directed there. The patient verbally consented to this visit. . Location of the patient: home . Location of the provider: work . Those involved with this call:  . Provider: Park Liter, DO . CMA: Tiffany Reel, CMA . Front Desk/Registration: Don Perking  . Time spent on call: 15 minutes with patient face to face via video conference. More than 50% of this time was spent in counseling and coordination of care. 23 minutes total spent in review of patient's record and preparation of their chart.

## 2019-05-28 NOTE — Telephone Encounter (Signed)
Unfortunately with URI symptoms, we cannot see her in the office. I can see her virtually or she can go to the respiratory clinic

## 2019-06-13 ENCOUNTER — Telehealth: Payer: Self-pay | Admitting: Family Medicine

## 2019-06-13 NOTE — Telephone Encounter (Signed)
Pt states that she was put on Augmentin after having an allergic reaction to the antibiotic that she was given by Dr. Wynetta Emery and now has a yeast infection.  Pt wants to know if Dr. Wynetta Emery will call in the pink pill for her to get rid of yeast infection.  Pt states that she needs this done by 6pm today since that is when her pharmacy closes. Pt uses  [facility], [place] Phone:  [phone]  Fax:  [phone]

## 2019-06-15 MED ORDER — FLUCONAZOLE 150 MG PO TABS: 150.0000 mg | ORAL_TABLET | Freq: Every day | ORAL | 0 refills | Status: DC

## 2019-07-24 ENCOUNTER — Other Ambulatory Visit: Payer: Self-pay | Admitting: Family Medicine

## 2019-08-09 ENCOUNTER — Ambulatory Visit: Payer: Self-pay | Admitting: Family Medicine

## 2019-11-09 ENCOUNTER — Ambulatory Visit (INDEPENDENT_AMBULATORY_CARE_PROVIDER_SITE_OTHER): Payer: Self-pay | Admitting: Family Medicine

## 2019-11-09 ENCOUNTER — Encounter: Payer: Self-pay | Admitting: Family Medicine

## 2019-11-09 ENCOUNTER — Telehealth: Payer: Self-pay | Admitting: Family Medicine

## 2019-11-09 ENCOUNTER — Other Ambulatory Visit: Payer: Self-pay

## 2019-11-09 DIAGNOSIS — N3000 Acute cystitis without hematuria: Secondary | ICD-10-CM

## 2019-11-09 DIAGNOSIS — R300 Dysuria: Secondary | ICD-10-CM

## 2019-11-09 DIAGNOSIS — M25512 Pain in left shoulder: Secondary | ICD-10-CM

## 2019-11-09 LAB — URINALYSIS, ROUTINE W REFLEX MICROSCOPIC
Bilirubin, UA: NEGATIVE
Glucose, UA: NEGATIVE
Ketones, UA: NEGATIVE
Nitrite, UA: POSITIVE — AB
Protein,UA: NEGATIVE
RBC, UA: NEGATIVE
Specific Gravity, UA: 1.02 (ref 1.005–1.030)
Urobilinogen, Ur: 0.2 mg/dL (ref 0.2–1.0)
pH, UA: 6.5 (ref 5.0–7.5)

## 2019-11-09 LAB — MICROSCOPIC EXAMINATION

## 2019-11-09 MED ORDER — ETODOLAC 400 MG PO TABS: 400.0000 mg | ORAL_TABLET | Freq: Two times a day (BID) | ORAL | 1 refills | Status: DC | PRN

## 2019-11-09 MED ORDER — NITROFURANTOIN MONOHYD MACRO 100 MG PO CAPS: 100.0000 mg | ORAL_CAPSULE | Freq: Two times a day (BID) | ORAL | 0 refills | Status: DC

## 2019-11-09 NOTE — Progress Notes (Signed)
There were no vitals taken for this visit.   Subjective:    Patient ID: Jaime Freeman, female    DOB: 13-Jun-1955, 64 y.o.   MRN: [id number]  HPI: Jaime Freeman is a 64 y.o. female  Chief Complaint  Patient presents with  . Dysuria   URINARY SYMPTOMS Duration: 2-3 days ago Dysuria: burning Urinary frequency: yes Urgency: yes Small volume voids: yes Symptom severity: moderate Urinary incontinence: yes Foul odor: no Hematuria: no Abdominal pain: no Back pain: no Suprapubic pain/pressure: yes Flank pain: no Fever:  no Vomiting: no Relief with cranberry juice: no Relief with pyridium: yes Status: better Previous urinary tract infection: yes Recurrent urinary tract infection: no Sexual activity: No sexually active History of sexually transmitted disease: no Vaginal discharge: no Treatments attempted: pyridium and increasing fluids   Jaime Freeman notes that she was trying to push something up above shoulder level a few weeks ago and she felt something pull in her L shoulder and it has been hurting since then. It's aching and sore and goes from her lateral shoulder down her arm to her wrist. Worse with moving, better with rest. Pain is worsening. She has otherwise been feeling well but is concerned about her shoulder.   Relevant past medical, surgical, family and social history reviewed and updated as indicated. Interim medical history since our last visit reviewed. Allergies and medications reviewed and updated.  Review of Systems  Constitutional: Negative.   HENT: Positive for congestion.   Respiratory: Negative.   Cardiovascular: Negative.   Gastrointestinal: Negative.   Musculoskeletal: Positive for arthralgias and myalgias. Negative for back pain, gait problem, joint swelling, neck pain and neck stiffness.  Neurological: Negative.   Psychiatric/Behavioral: Negative.     Per HPI unless specifically indicated above     Objective:    There were no vitals taken for  this visit.  Wt Readings from Last 3 Encounters:  05/16/19 204 lb (92.5 kg)  02/08/19 208 lb (94.3 kg)  01/11/19 208 lb (94.3 kg)    Physical Exam Vitals and nursing note reviewed.  Constitutional:      General: She is not in acute distress.    Appearance: Normal appearance. She is not ill-appearing, toxic-appearing or diaphoretic.  HENT:     Head: Normocephalic and atraumatic.     Right Ear: External ear normal.     Left Ear: External ear normal.     Nose: Nose normal.     Mouth/Throat:     Mouth: Mucous membranes are moist.     Pharynx: Oropharynx is clear.  Eyes:     General: No scleral icterus.       Right eye: No discharge.        Left eye: No discharge.     Extraocular Movements: Extraocular movements intact.     Conjunctiva/sclera: Conjunctivae normal.     Pupils: Pupils are equal, round, and reactive to light.  Cardiovascular:     Rate and Rhythm: Normal rate and regular rhythm.     Pulses: Normal pulses.     Heart sounds: Normal heart sounds. No murmur heard.  No friction rub. No gallop.   Pulmonary:     Effort: Pulmonary effort is normal. No respiratory distress.     Breath sounds: Normal breath sounds. No stridor. No wheezing, rhonchi or rales.  Chest:     Chest wall: No tenderness.  Musculoskeletal:        General: Tenderness (to deltoid) present. Normal range of motion.  Cervical back: Normal range of motion and neck supple.  Skin:    General: Skin is warm and dry.     Capillary Refill: Capillary refill takes less than 2 seconds.     Coloration: Skin is not jaundiced or pale.     Findings: No bruising, erythema, lesion or rash.  Neurological:     General: No focal deficit present.     Mental Status: She is alert and oriented to person, place, and time. Mental status is at baseline.  Psychiatric:        Mood and Affect: Mood normal.        Behavior: Behavior normal.        Thought Content: Thought content normal.        Judgment: Judgment normal.      Results for orders placed or performed in visit on 11/09/19  Urine Culture   Specimen: Urine   UR  Result Value Ref Range   Urine Culture, Routine Preliminary report (A)    Organism ID, Bacteria Gram negative rods (A)   Microscopic Examination   Urine  Result Value Ref Range   WBC, UA 11-30 (A) 0 - 5 /hpf   RBC 3-10 (A) 0 - 2 /hpf   Epithelial Cells (non renal) 0-10 0 - 10 /hpf   Bacteria, UA Many (A) None seen/Few  Urinalysis, Routine w reflex microscopic  Result Value Ref Range   Specific Gravity, UA 1.020 1.005 - 1.030   pH, UA 6.5 5.0 - 7.5   Color, UA Yellow Yellow   Appearance Ur Hazy (A) Clear   Leukocytes,UA Trace (A) Negative   Protein,UA Negative Negative/Trace   Glucose, UA Negative Negative   Ketones, UA Negative Negative   RBC, UA Negative Negative   Bilirubin, UA Negative Negative   Urobilinogen, Ur 0.2 0.2 - 1.0 mg/dL   Nitrite, UA Positive (A) Negative   Microscopic Examination See below:       Assessment & Plan:   Problem List Items Addressed This Visit    None    Visit Diagnoses    Dysuria    -  Primary   +UTI- will treat and await culture. Call with any concerns.    Relevant Orders   Urinalysis, Routine w reflex microscopic (Completed)   Acute pain of left shoulder       Will obtain and start exercises. Call with any concerns. Await results.    Relevant Orders   DG Shoulder Left   Acute cystitis without hematuria       +UTI- will treat and await culture. Call with any concerns.    Relevant Orders   Urine Culture (Completed)       Follow up plan: Return in about 3 months (around 02/09/2020).

## 2019-11-09 NOTE — Telephone Encounter (Signed)
Copied from [place] [id number]. Topic: General - Inquiry >> Nov 08, 2019  3:11 PM Mathis Bud wrote: Reason for CRM: Patient is requesting medication due a sharpe pain when she urinates.  Patient does not want an appt patient would like just medication called in if possible.  If not please call patient back and let her know Pharmacy: [facility] in [place] [address],  Call back [phone]

## 2019-11-09 NOTE — Telephone Encounter (Signed)
Patient had an appointment this morning.

## 2019-11-09 NOTE — Patient Instructions (Signed)
Shoulder Exercises Ask your health care provider which exercises are safe for you. Do exercises exactly as told by your health care provider and adjust them as directed. It is normal to feel mild stretching, pulling, tightness, or discomfort as you do these exercises. Stop right away if you feel sudden pain or your pain gets worse. Do not begin these exercises until told by your health care provider. Stretching exercises External rotation and abduction This exercise is sometimes called corner stretch. This exercise rotates your arm outward (external rotation) and moves your arm out from your body (abduction). 1. Stand in a doorway with one of your feet slightly in front of the other. This is called a staggered stance. If you cannot reach your forearms to the door frame, stand facing a corner of a room. 2. Choose one of the following positions as told by your health care provider: ? Place your hands and forearms on the door frame above your head. ? Place your hands and forearms on the door frame at the height of your head. ? Place your hands on the door frame at the height of your elbows. 3. Slowly move your weight onto your front foot until you feel a stretch across your chest and in the front of your shoulders. Keep your head and chest upright and keep your abdominal muscles tight. 4. Hold for __________ seconds. 5. To release the stretch, shift your weight to your back foot. Repeat __________ times. Complete this exercise __________ times a day. Extension, standing 1. Stand and hold a broomstick, a cane, or a similar object behind your back. ? Your hands should be a little wider than shoulder width apart. ? Your palms should face away from your back. 2. Keeping your elbows straight and your shoulder muscles relaxed, move the stick away from your body until you feel a stretch in your shoulders (extension). ? Avoid shrugging your shoulders while you move the stick. Keep your shoulder blades tucked  down toward the middle of your back. 3. Hold for __________ seconds. 4. Slowly return to the starting position. Repeat __________ times. Complete this exercise __________ times a day. Range-of-motion exercises Pendulum  1. Stand near a wall or a surface that you can hold onto for balance. 2. Bend at the waist and let your left / right arm hang straight down. Use your other arm to support you. Keep your back straight and do not lock your knees. 3. Relax your left / right arm and shoulder muscles, and move your hips and your trunk so your left / right arm swings freely. Your arm should swing because of the motion of your body, not because you are using your arm or shoulder muscles. 4. Keep moving your hips and trunk so your arm swings in the following directions, as told by your health care provider: ? Side to side. ? Forward and backward. ? In clockwise and counterclockwise circles. 5. Continue each motion for __________ seconds, or for as long as told by your health care provider. 6. Slowly return to the starting position. Repeat __________ times. Complete this exercise __________ times a day. Shoulder flexion, standing  1. Stand and hold a broomstick, a cane, or a similar object. Place your hands a little more than shoulder width apart on the object. Your left / right hand should be palm up, and your other hand should be palm down. 2. Keep your elbow straight and your shoulder muscles relaxed. Push the stick up with your healthy arm to   raise your left / right arm in front of your body, and then over your head until you feel a stretch in your shoulder (flexion). ? Avoid shrugging your shoulder while you raise your arm. Keep your shoulder blade tucked down toward the middle of your back. 3. Hold for __________ seconds. 4. Slowly return to the starting position. Repeat __________ times. Complete this exercise __________ times a day. Shoulder abduction, standing 1. Stand and hold a broomstick,  a cane, or a similar object. Place your hands a little more than shoulder width apart on the object. Your left / right hand should be palm up, and your other hand should be palm down. 2. Keep your elbow straight and your shoulder muscles relaxed. Push the object across your body toward your left / right side. Raise your left / right arm to the side of your body (abduction) until you feel a stretch in your shoulder. ? Do not raise your arm above shoulder height unless your health care provider tells you to do that. ? If directed, raise your arm over your head. ? Avoid shrugging your shoulder while you raise your arm. Keep your shoulder blade tucked down toward the middle of your back. 3. Hold for __________ seconds. 4. Slowly return to the starting position. Repeat __________ times. Complete this exercise __________ times a day. Internal rotation  1. Place your left / right hand behind your back, palm up. 2. Use your other hand to dangle an exercise band, a towel, or a similar object over your shoulder. Grasp the band with your left / right hand so you are holding on to both ends. 3. Gently pull up on the band until you feel a stretch in the front of your left / right shoulder. The movement of your arm toward the center of your body is called internal rotation. ? Avoid shrugging your shoulder while you raise your arm. Keep your shoulder blade tucked down toward the middle of your back. 4. Hold for __________ seconds. 5. Release the stretch by letting go of the band and lowering your hands. Repeat __________ times. Complete this exercise __________ times a day. Strengthening exercises External rotation  1. Sit in a stable chair without armrests. 2. Secure an exercise band to a stable object at elbow height on your left / right side. 3. Place a soft object, such as a folded towel or a small pillow, between your left / right upper arm and your body to move your elbow about 4 inches (10 cm) away  from your side. 4. Hold the end of the exercise band so it is tight and there is no slack. 5. Keeping your elbow pressed against the soft object, slowly move your forearm out, away from your abdomen (external rotation). Keep your body steady so only your forearm moves. 6. Hold for __________ seconds. 7. Slowly return to the starting position. Repeat __________ times. Complete this exercise __________ times a day. Shoulder abduction  1. Sit in a stable chair without armrests, or stand up. 2. Hold a __________ weight in your left / right hand, or hold an exercise band with both hands. 3. Start with your arms straight down and your left / right palm facing in, toward your body. 4. Slowly lift your left / right hand out to your side (abduction). Do not lift your hand above shoulder height unless your health care provider tells you that this is safe. ? Keep your arms straight. ? Avoid shrugging your shoulder while you   do this movement. Keep your shoulder blade tucked down toward the middle of your back. 5. Hold for __________ seconds. 6. Slowly lower your arm, and return to the starting position. Repeat __________ times. Complete this exercise __________ times a day. Shoulder extension 1. Sit in a stable chair without armrests, or stand up. 2. Secure an exercise band to a stable object in front of you so it is at shoulder height. 3. Hold one end of the exercise band in each hand. Your palms should face each other. 4. Straighten your elbows and lift your hands up to shoulder height. 5. Step back, away from the secured end of the exercise band, until the band is tight and there is no slack. 6. Squeeze your shoulder blades together as you pull your hands down to the sides of your thighs (extension). Stop when your hands are straight down by your sides. Do not let your hands go behind your body. 7. Hold for __________ seconds. 8. Slowly return to the starting position. Repeat __________ times.  Complete this exercise __________ times a day. Shoulder row 1. Sit in a stable chair without armrests, or stand up. 2. Secure an exercise band to a stable object in front of you so it is at waist height. 3. Hold one end of the exercise band in each hand. Position your palms so that your thumbs are facing the ceiling (neutral position). 4. Bend each of your elbows to a 90-degree angle (right angle) and keep your upper arms at your sides. 5. Step back until the band is tight and there is no slack. 6. Slowly pull your elbows back behind you. 7. Hold for __________ seconds. 8. Slowly return to the starting position. Repeat __________ times. Complete this exercise __________ times a day. Shoulder press-ups  1. Sit in a stable chair that has armrests. Sit upright, with your feet flat on the floor. 2. Put your hands on the armrests so your elbows are bent and your fingers are pointing forward. Your hands should be about even with the sides of your body. 3. Push down on the armrests and use your arms to lift yourself off the chair. Straighten your elbows and lift yourself up as much as you comfortably can. ? Move your shoulder blades down, and avoid letting your shoulders move up toward your ears. ? Keep your feet on the ground. As you get stronger, your feet should support less of your body weight as you lift yourself up. 4. Hold for __________ seconds. 5. Slowly lower yourself back into the chair. Repeat __________ times. Complete this exercise __________ times a day. Wall push-ups  1. Stand so you are facing a stable wall. Your feet should be about one arm-length away from the wall. 2. Lean forward and place your palms on the wall at shoulder height. 3. Keep your feet flat on the floor as you bend your elbows and lean forward toward the wall. 4. Hold for __________ seconds. 5. Straighten your elbows to push yourself back to the starting position. Repeat __________ times. Complete this exercise  __________ times a day. This information is not intended to replace advice given to you by your health care provider. Make sure you discuss any questions you have with your health care provider. Document Revised: 08/04/2018 Document Reviewed: 05/12/2018 Elsevier Patient Education  [place].  Shoulder Exercises Ask your health care provider which exercises are safe for you. Do exercises exactly as told by your health care provider and adjust them  as directed. It is normal to feel mild stretching, pulling, tightness, or discomfort as you do these exercises. Stop right away if you feel sudden pain or your pain gets worse. Do not begin these exercises until told by your health care provider. Stretching exercises External rotation and abduction This exercise is sometimes called corner stretch. This exercise rotates your arm outward (external rotation) and moves your arm out from your body (abduction). 6. Stand in a doorway with one of your feet slightly in front of the other. This is called a staggered stance. If you cannot reach your forearms to the door frame, stand facing a corner of a room. 7. Choose one of the following positions as told by your health care provider: ? Place your hands and forearms on the door frame above your head. ? Place your hands and forearms on the door frame at the height of your head. ? Place your hands on the door frame at the height of your elbows. 8. Slowly move your weight onto your front foot until you feel a stretch across your chest and in the front of your shoulders. Keep your head and chest upright and keep your abdominal muscles tight. 9. Hold for __________ seconds. 10. To release the stretch, shift your weight to your back foot. Repeat __________ times. Complete this exercise __________ times a day. Extension, standing 5. Stand and hold a broomstick, a cane, or a similar object behind your back. ? Your hands should be a little wider than shoulder  width apart. ? Your palms should face away from your back. 6. Keeping your elbows straight and your shoulder muscles relaxed, move the stick away from your body until you feel a stretch in your shoulders (extension). ? Avoid shrugging your shoulders while you move the stick. Keep your shoulder blades tucked down toward the middle of your back. 7. Hold for __________ seconds. 8. Slowly return to the starting position. Repeat __________ times. Complete this exercise __________ times a day. Range-of-motion exercises Pendulum  7. Stand near a wall or a surface that you can hold onto for balance. 8. Bend at the waist and let your left / right arm hang straight down. Use your other arm to support you. Keep your back straight and do not lock your knees. 9. Relax your left / right arm and shoulder muscles, and move your hips and your trunk so your left / right arm swings freely. Your arm should swing because of the motion of your body, not because you are using your arm or shoulder muscles. 10. Keep moving your hips and trunk so your arm swings in the following directions, as told by your health care provider: ? Side to side. ? Forward and backward. ? In clockwise and counterclockwise circles. 11. Continue each motion for __________ seconds, or for as long as told by your health care provider. 12. Slowly return to the starting position. Repeat __________ times. Complete this exercise __________ times a day. Shoulder flexion, standing  5. Stand and hold a broomstick, a cane, or a similar object. Place your hands a little more than shoulder width apart on the object. Your left / right hand should be palm up, and your other hand should be palm down. 6. Keep your elbow straight and your shoulder muscles relaxed. Push the stick up with your healthy arm to raise your left / right arm in front of your body, and then over your head until you feel a stretch in your shoulder (flexion). ?  Avoid shrugging your  shoulder while you raise your arm. Keep your shoulder blade tucked down toward the middle of your back. 7. Hold for __________ seconds. 8. Slowly return to the starting position. Repeat __________ times. Complete this exercise __________ times a day. Shoulder abduction, standing 5. Stand and hold a broomstick, a cane, or a similar object. Place your hands a little more than shoulder width apart on the object. Your left / right hand should be palm up, and your other hand should be palm down. 6. Keep your elbow straight and your shoulder muscles relaxed. Push the object across your body toward your left / right side. Raise your left / right arm to the side of your body (abduction) until you feel a stretch in your shoulder. ? Do not raise your arm above shoulder height unless your health care provider tells you to do that. ? If directed, raise your arm over your head. ? Avoid shrugging your shoulder while you raise your arm. Keep your shoulder blade tucked down toward the middle of your back. 7. Hold for __________ seconds. 8. Slowly return to the starting position. Repeat __________ times. Complete this exercise __________ times a day. Internal rotation  6. Place your left / right hand behind your back, palm up. 7. Use your other hand to dangle an exercise band, a towel, or a similar object over your shoulder. Grasp the band with your left / right hand so you are holding on to both ends. 8. Gently pull up on the band until you feel a stretch in the front of your left / right shoulder. The movement of your arm toward the center of your body is called internal rotation. ? Avoid shrugging your shoulder while you raise your arm. Keep your shoulder blade tucked down toward the middle of your back. 9. Hold for __________ seconds. 10. Release the stretch by letting go of the band and lowering your hands. Repeat __________ times. Complete this exercise __________ times a day. Strengthening  exercises External rotation  8. Sit in a stable chair without armrests. 9. Secure an exercise band to a stable object at elbow height on your left / right side. 10. Place a soft object, such as a folded towel or a small pillow, between your left / right upper arm and your body to move your elbow about 4 inches (10 cm) away from your side. 11. Hold the end of the exercise band so it is tight and there is no slack. 12. Keeping your elbow pressed against the soft object, slowly move your forearm out, away from your abdomen (external rotation). Keep your body steady so only your forearm moves. 13. Hold for __________ seconds. 14. Slowly return to the starting position. Repeat __________ times. Complete this exercise __________ times a day. Shoulder abduction  7. Sit in a stable chair without armrests, or stand up. 8. Hold a __________ weight in your left / right hand, or hold an exercise band with both hands. 9. Start with your arms straight down and your left / right palm facing in, toward your body. 10. Slowly lift your left / right hand out to your side (abduction). Do not lift your hand above shoulder height unless your health care provider tells you that this is safe. ? Keep your arms straight. ? Avoid shrugging your shoulder while you do this movement. Keep your shoulder blade tucked down toward the middle of your back. 11. Hold for __________ seconds. 12. Slowly lower your arm, and  return to the starting position. Repeat __________ times. Complete this exercise __________ times a day. Shoulder extension 9. Sit in a stable chair without armrests, or stand up. 10. Secure an exercise band to a stable object in front of you so it is at shoulder height. 11. Hold one end of the exercise band in each hand. Your palms should face each other. 12. Straighten your elbows and lift your hands up to shoulder height. 13. Step back, away from the secured end of the exercise band, until the band is  tight and there is no slack. 14. Squeeze your shoulder blades together as you pull your hands down to the sides of your thighs (extension). Stop when your hands are straight down by your sides. Do not let your hands go behind your body. 15. Hold for __________ seconds. 16. Slowly return to the starting position. Repeat __________ times. Complete this exercise __________ times a day. Shoulder row 9. Sit in a stable chair without armrests, or stand up. 10. Secure an exercise band to a stable object in front of you so it is at waist height. 11. Hold one end of the exercise band in each hand. Position your palms so that your thumbs are facing the ceiling (neutral position). 12. Bend each of your elbows to a 90-degree angle (right angle) and keep your upper arms at your sides. 13. Step back until the band is tight and there is no slack. 14. Slowly pull your elbows back behind you. 15. Hold for __________ seconds. 16. Slowly return to the starting position. Repeat __________ times. Complete this exercise __________ times a day. Shoulder press-ups  6. Sit in a stable chair that has armrests. Sit upright, with your feet flat on the floor. 7. Put your hands on the armrests so your elbows are bent and your fingers are pointing forward. Your hands should be about even with the sides of your body. 8. Push down on the armrests and use your arms to lift yourself off the chair. Straighten your elbows and lift yourself up as much as you comfortably can. ? Move your shoulder blades down, and avoid letting your shoulders move up toward your ears. ? Keep your feet on the ground. As you get stronger, your feet should support less of your body weight as you lift yourself up. 9. Hold for __________ seconds. 10. Slowly lower yourself back into the chair. Repeat __________ times. Complete this exercise __________ times a day. Wall push-ups  6. Stand so you are facing a stable wall. Your feet should be about one  arm-length away from the wall. 7. Lean forward and place your palms on the wall at shoulder height. 8. Keep your feet flat on the floor as you bend your elbows and lean forward toward the wall. 9. Hold for __________ seconds. 10. Straighten your elbows to push yourself back to the starting position. Repeat __________ times. Complete this exercise __________ times a day. This information is not intended to replace advice given to you by your health care provider. Make sure you discuss any questions you have with your health care provider. Document Revised: 08/04/2018 Document Reviewed: 05/12/2018 Elsevier Patient Education  [place].

## 2019-11-14 LAB — URINE CULTURE

## 2019-11-16 ENCOUNTER — Telehealth: Payer: Self-pay | Admitting: Family Medicine

## 2019-11-16 ENCOUNTER — Other Ambulatory Visit: Payer: Self-pay | Admitting: Family Medicine

## 2019-11-16 MED ORDER — FLUCONAZOLE 150 MG PO TABS: 150.0000 mg | ORAL_TABLET | Freq: Every day | ORAL | 0 refills | Status: DC

## 2019-11-16 NOTE — Telephone Encounter (Signed)
Pt called back wanting to know if Dr. Wynetta Emery is going to call in the [place] is where she would like it to be sent to  CB# [phone]  She is in desperate need of something for the yeast .

## 2019-11-16 NOTE — Telephone Encounter (Signed)
Pt states the abx she was given for her UTI last week has given her   a yeast infection.  She states it may have come from the yeast that she has under her belly that is getting worse too. Pt is requesting Dr to send in 2 pills of the diflucan. Pt states her granddaughter is getting pinned tonight from nursing school, and she will be miserable if she cannot get some help. Pt requesting call back to let her know what the dr says.    [id number] - [place], [place] - [address] Phone:  [phone]  Fax:  [phone]

## 2019-11-19 NOTE — Telephone Encounter (Signed)
Pt picked up script on Friday.

## 2019-11-22 ENCOUNTER — Telehealth: Payer: Self-pay | Admitting: Family Medicine

## 2019-11-22 DIAGNOSIS — M25511 Pain in right shoulder: Secondary | ICD-10-CM

## 2019-11-22 NOTE — Telephone Encounter (Signed)
Copied from [place] [phone]. Topic: General - Inquiry >> Nov 22, 2019 11:40 AM [name] wrote: Reason for CRM: pt called in and stated she fell yesterday out side and landed on her other shoulder.  Pt already had a order in the system to get a xray on one of her shoulders.  She would like to know if Dr Wynetta Emery could put in additional order to get her other shoulder xrayed

## 2019-11-23 NOTE — Telephone Encounter (Signed)
Patient notified

## 2019-11-23 NOTE — Telephone Encounter (Signed)
Order in.

## 2019-11-23 NOTE — Addendum Note (Signed)
Addended by: Valerie Roys on: 11/23/2019 12:14 PM   Modules accepted: Orders

## 2019-12-04 IMAGING — CR DG ABDOMEN 2V
3 series · 3 of 3 positions shown · non-contrast
Comparison: CT abdomen 07/01/2014

CLINICAL DATA: Constipation.

EXAM:
ABDOMEN - 2 VIEW

[abdomen erect]
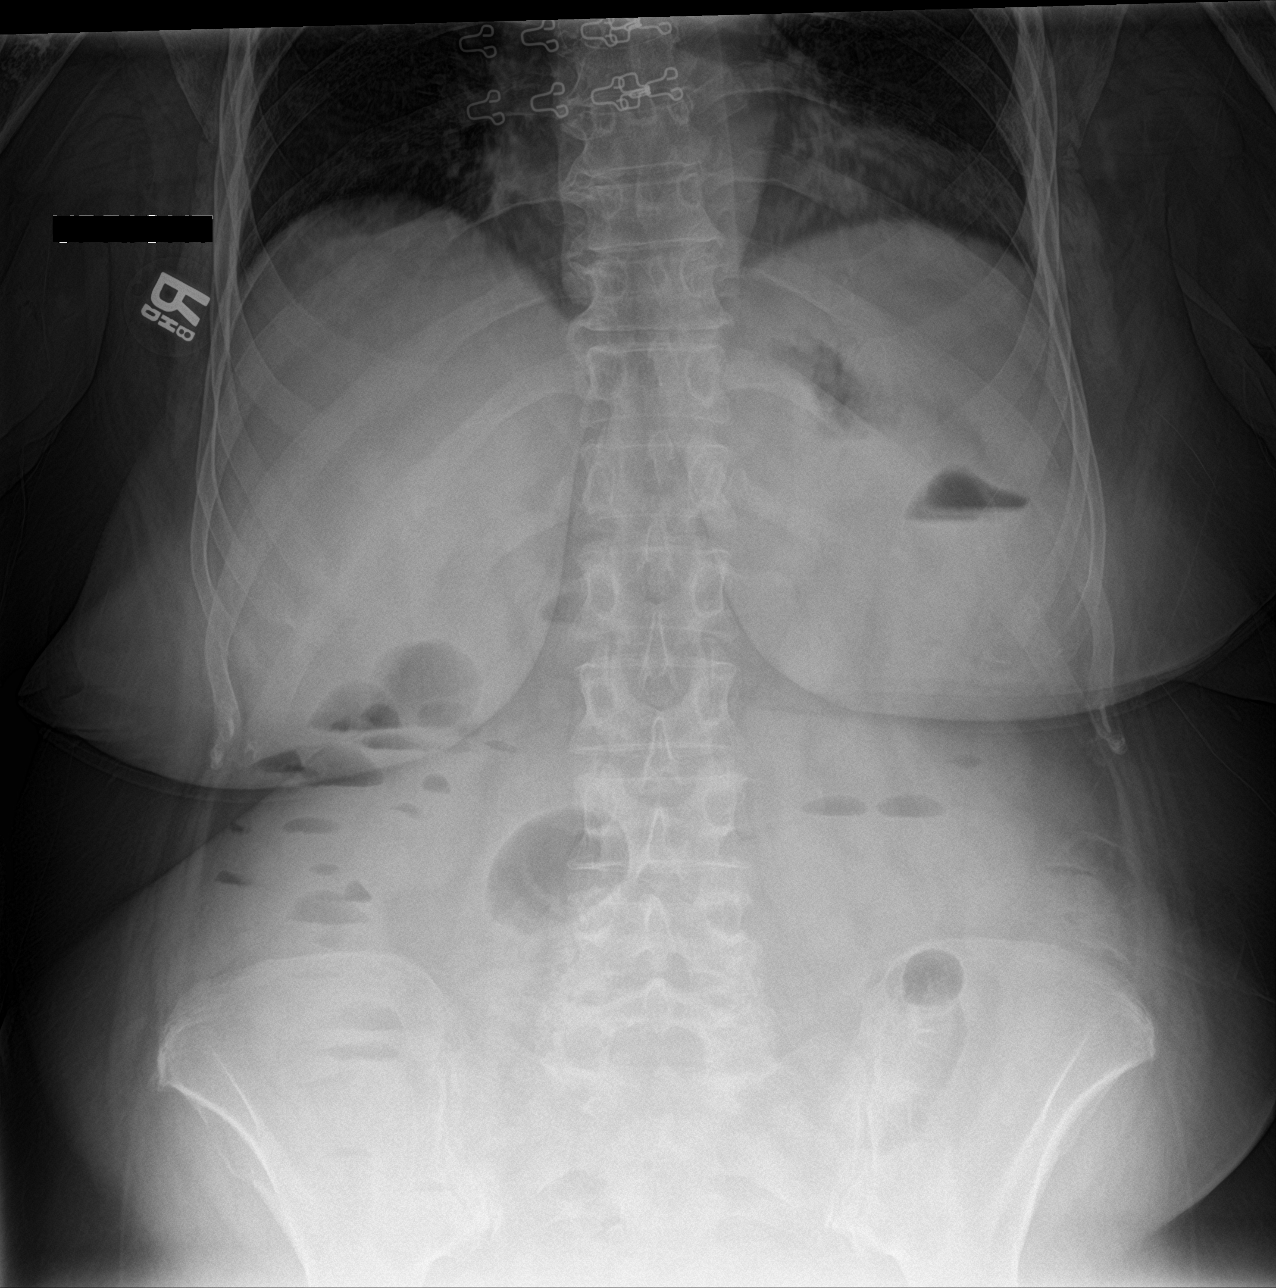

[abdomen supine (1 of 2)]
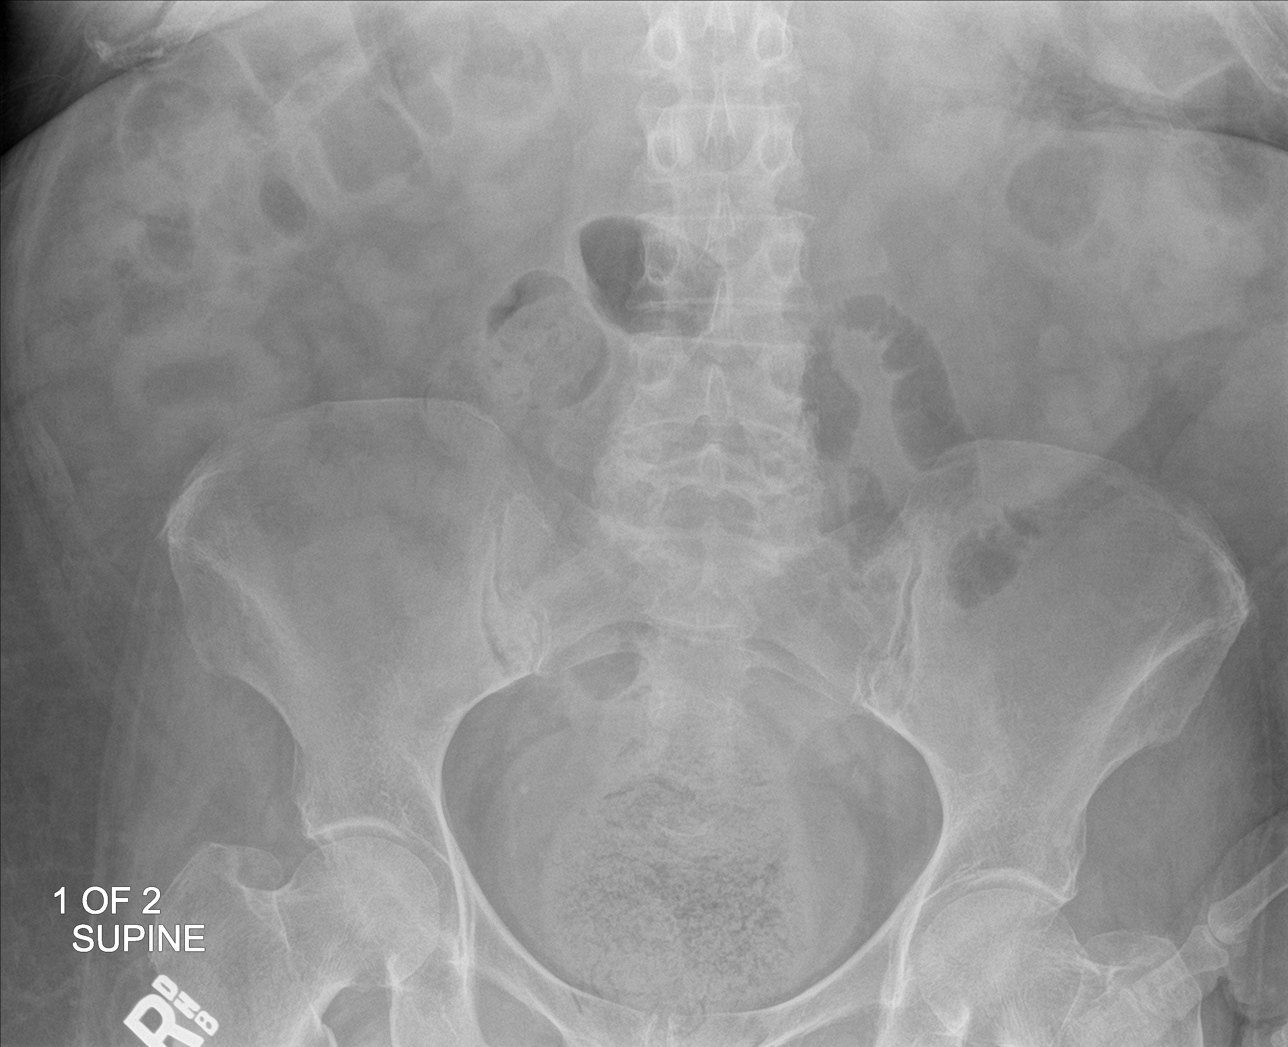

[abdomen supine (2 of 2)]
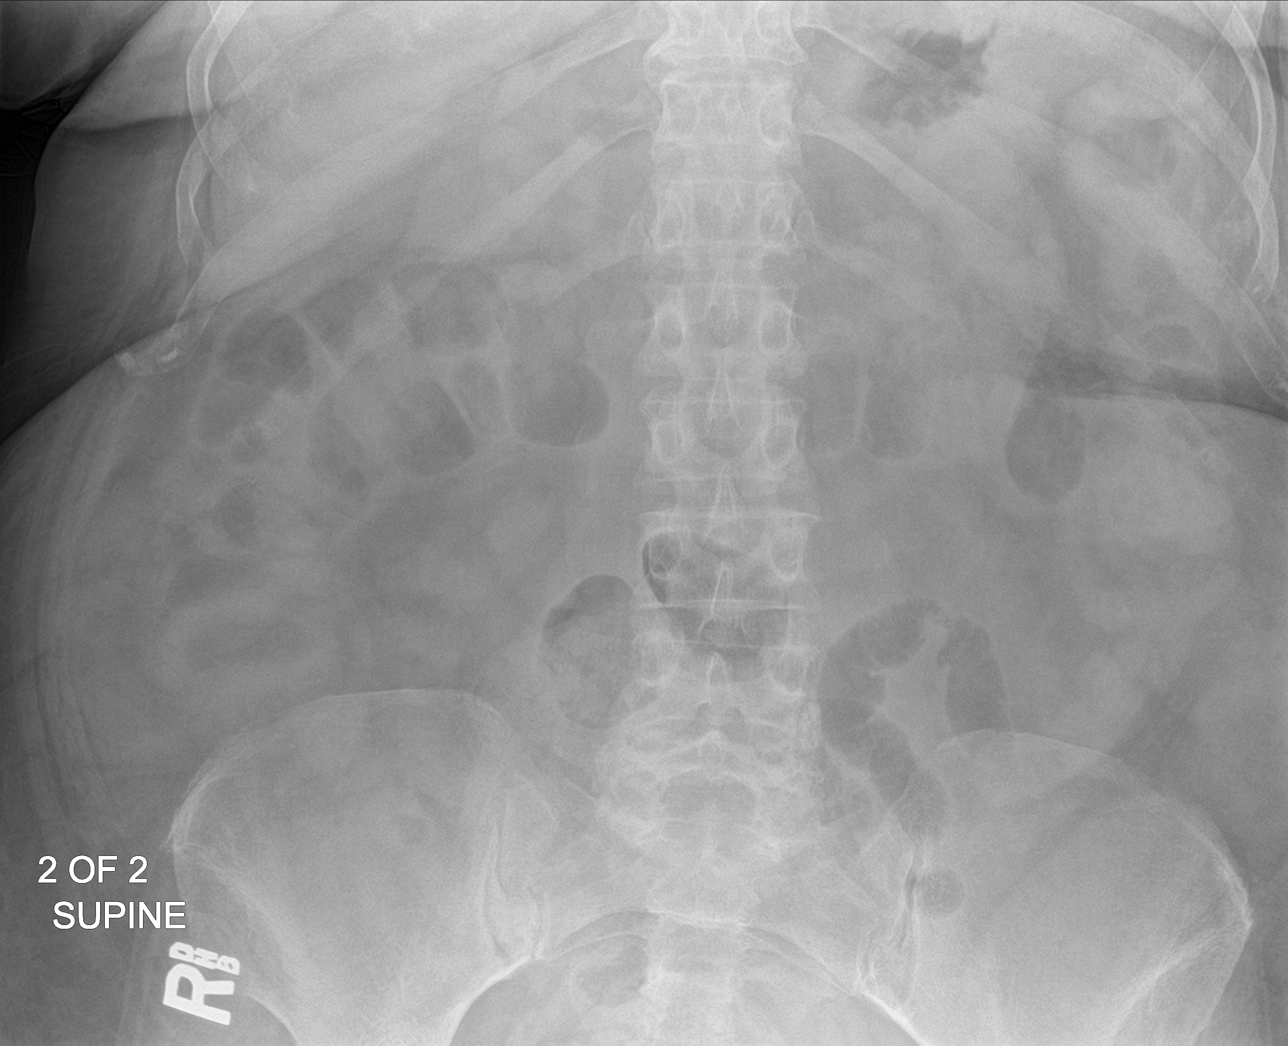

[3 of 3 positions shown; findings below may reference images not displayed]

FINDINGS: Multiple air-fluid levels are identified within the abdomen. No
dilated small bowel loops identified. A large desiccated stool ball
is identified within the rectum measuring 8.6 cm in diameter. No
free air identified.
IMPRESSION: 1. Large stool ball within the rectum which may reflect constipation
or impaction.
2. Multiple air-fluid levels are identified on the upright images.
If there is a clinical concern for bowel obstruction consider
further of evaluation with CT of the abdomen pelvis.

## 2020-01-11 ENCOUNTER — Other Ambulatory Visit: Payer: Self-pay

## 2020-01-11 NOTE — Telephone Encounter (Signed)
[place] requests Rx refill on Clonazepam 1 mg tab

## 2020-01-16 NOTE — Telephone Encounter (Signed)
Called patient and stated she has been feeling better without the medication. Pt stated she will call back to schedule an appointment if she would really need to start taking the medication.

## 2020-02-18 ENCOUNTER — Other Ambulatory Visit: Payer: Self-pay | Admitting: Family Medicine

## 2020-02-18 DIAGNOSIS — F332 Major depressive disorder, recurrent severe without psychotic features: Secondary | ICD-10-CM

## 2020-02-18 MED ORDER — BENAZEPRIL HCL 20 MG PO TABS
20.0000 mg | ORAL_TABLET | Freq: Every day | ORAL | 0 refills | Status: DC
Start: 2020-02-18 — End: 2020-05-26

## 2020-02-18 NOTE — Telephone Encounter (Signed)
PT need a refill  benazepril (LOTENSIN) 20 MG tablet [[id number]]  [place], [place], [place] [address]  [address] [place] [address]  Phone: [phone] Fax: [phone]

## 2020-02-18 NOTE — Telephone Encounter (Signed)
Requested medication (s) are due for refill today: yes  Requested medication (s) are on the active medication list:yes  Last refill:  01/30/20  Future visit scheduled: no  Notes to clinic:  no delegated    Requested Prescriptions  Pending Prescriptions Disp Refills   QUEtiapine (SEROQUEL) 25 MG tablet [Pharmacy Med Name: quetiapine 25 mg tablet] 270 tablet 1    Sig: TAKE 1 OR 2 TABLETS BY MOUTH AT BEDTIME & TAKE 1/2 TABLET BY MOUTH ONCE DAILY AS NEEDED ANXIETY UP TO THREE TIMES      Not Delegated - Psychiatry:  Antipsychotics - Second Generation (Atypical) - quetiapine Failed - 02/18/2020  9:06 AM      Failed - This refill cannot be delegated      Failed - ALT in normal range and within 180 days    ALT  Date Value Ref Range Status  02/08/2019 12 0 - 32 IU/L Final          Failed - AST in normal range and within 180 days    AST  Date Value Ref Range Status  02/08/2019 18 0 - 40 IU/L Final          Passed - Completed PHQ-2 or PHQ-9 in the last 360 days.      Passed - Last BP in normal range    BP Readings from Last 1 Encounters:  05/28/19 125/68          Passed - Valid encounter within last 6 months    Recent Outpatient Visits           3 months ago [place], Megan P, DO   8 months ago Acute non-recurrent maxillary sinusitis   [facility] [place], Megan P, DO   9 months ago Cough   [place], Megan P, DO   10 months ago Acute non-recurrent maxillary sinusitis   [facility] [place], Megan P, DO   1 year ago IFG (impaired fasting glucose)   [facility], Megan P, DO               Refused Prescriptions Disp Refills   benazepril (LOTENSIN) 20 MG tablet [Pharmacy Med Name: benazepril 20 mg tablet] 90 tablet 1    Sig: TAKE ONE TABLET BY MOUTH ONCE DAILY      Cardiovascular:  ACE Inhibitors Failed - 02/18/2020  9:06 AM      Failed - Cr in normal range and  within 180 days    Creatinine, Ser  Date Value Ref Range Status  02/08/2019 0.77 0.57 - 1.00 mg/dL Final          Failed - K in normal range and within 180 days    Potassium  Date Value Ref Range Status  02/08/2019 4.6 3.5 - 5.2 mmol/L Final          Passed - Patient is not pregnant      Passed - Last BP in normal range    BP Readings from Last 1 Encounters:  05/28/19 125/68          Passed - Valid encounter within last 6 months    Recent Outpatient Visits           3 months ago [place], Megan P, DO   8 months ago Acute non-recurrent maxillary sinusitis   [facility] [place], Megan P, DO   9 months ago Cough   [facility] [place], Megan P, DO   10 months ago Acute non-recurrent maxillary sinusitis   [place], DO   1 year ago IFG (impaired fasting glucose)   [facility], [place], [place]

## 2020-02-18 NOTE — Telephone Encounter (Signed)
Requested Prescriptions  Pending Prescriptions Disp Refills  . benazepril (LOTENSIN) 20 MG tablet 90 tablet 0    Sig: Take 1 tablet (20 mg total) by mouth daily.     Cardiovascular:  ACE Inhibitors Failed - 02/18/2020 11:00 AM      Failed - Cr in normal range and within 180 days    Creatinine, Ser  Date Value Ref Range Status  02/08/2019 0.77 0.57 - 1.00 mg/dL Final         Failed - K in normal range and within 180 days    Potassium  Date Value Ref Range Status  02/08/2019 4.6 3.5 - 5.2 mmol/L Final         Passed - Patient is not pregnant      Passed - Last BP in normal range    BP Readings from Last 1 Encounters:  05/28/19 125/68         Passed - Valid encounter within last 6 months    Recent Outpatient Visits          3 months ago [place], Megan P, DO   8 months ago Acute non-recurrent maxillary sinusitis   [facility] [place], Megan P, DO   9 months ago Cough   [place], Megan P, DO   10 months ago Acute non-recurrent maxillary sinusitis   [place], DO   1 year ago IFG (impaired fasting glucose)   [facility] [place], [place], DO      Future Appointments            In 1 week Wynetta Emery, Barb Merino, DO [place], [facility]

## 2020-02-18 NOTE — Telephone Encounter (Signed)
Pt scheduled for 02/28/2020 at 1:40pm asking if medication can be sent to bridge her over.Please advise.

## 2020-02-18 NOTE — Telephone Encounter (Signed)
appt

## 2020-02-28 ENCOUNTER — Encounter: Payer: Self-pay | Admitting: Family Medicine

## 2020-02-28 ENCOUNTER — Ambulatory Visit (INDEPENDENT_AMBULATORY_CARE_PROVIDER_SITE_OTHER): Payer: Self-pay | Admitting: Family Medicine

## 2020-02-28 ENCOUNTER — Other Ambulatory Visit: Payer: Self-pay

## 2020-02-28 VITALS — BP 138/82 | HR 81 | Temp 98.5°F | Ht 62.0 in | Wt 203.0 lb

## 2020-02-28 DIAGNOSIS — F419 Anxiety disorder, unspecified: Secondary | ICD-10-CM

## 2020-02-28 DIAGNOSIS — I129 Hypertensive chronic kidney disease with stage 1 through stage 4 chronic kidney disease, or unspecified chronic kidney disease: Secondary | ICD-10-CM

## 2020-02-28 DIAGNOSIS — R7301 Impaired fasting glucose: Secondary | ICD-10-CM

## 2020-02-28 DIAGNOSIS — F4321 Adjustment disorder with depressed mood: Secondary | ICD-10-CM

## 2020-02-28 DIAGNOSIS — E785 Hyperlipidemia, unspecified: Secondary | ICD-10-CM

## 2020-02-28 DIAGNOSIS — F332 Major depressive disorder, recurrent severe without psychotic features: Secondary | ICD-10-CM

## 2020-02-28 DIAGNOSIS — N181 Chronic kidney disease, stage 1: Secondary | ICD-10-CM

## 2020-02-28 MED ORDER — CLONAZEPAM 0.5 MG PO TABS: 0.5000 mg | ORAL_TABLET | Freq: Two times a day (BID) | ORAL | 0 refills | Status: DC | PRN

## 2020-02-28 NOTE — Progress Notes (Signed)
BP 138/82 (BP Location: Left Arm, Cuff Size: Normal)   Pulse 81   Temp 98.5 F (36.9 C) (Oral)   Ht 5\' 2"  (1.575 m)   Wt 203 lb (92.1 kg)   SpO2 99%   BMI 37.13 kg/m    Subjective:    Patient ID: Jaime Freeman, female    DOB: Sep 04, 1955, 64 y.o.   MRN: [id number]  HPI: Jaime Freeman is a 64 y.o. female  Chief Complaint  Patient presents with  . Hypertension   Shoulder is feeling better.   Had GI upset and ended up with some transferred stool into her vaginal area and she has been feeling irritated there since.   Impaired Fasting Glucose HbA1C:  Lab Results  Component Value Date   HGBA1C 5.8 02/08/2019   Duration of elevated blood sugar: chronic Polydipsia: no Polyuria: no Weight change: no Visual disturbance: no Glucose Monitoring: no    Accucheck frequency: Not Checking Diabetic Education: Not Completed Family history of diabetes: yes  HYPERTENSION / HYPERLIPIDEMIA Satisfied with current treatment? yes Duration of hypertension: chronic BP monitoring frequency: not checking BP medication side effects: no Past BP meds: benazepril Duration of hyperlipidemia: chronic Cholesterol medication side effects: not on anything Cholesterol supplements: none Past cholesterol medications: none Medication compliance: excellent compliance Aspirin: yes Recent stressors: no Recurrent headaches: no Visual changes: no Palpitations: no Dyspnea: no Chest pain: no Lower extremity edema: no Dizzy/lightheaded: no  ANXIETY/STRESS Duration: chronic Status:stable Anxious mood: yes  Excessive worrying: no Irritability: no  Sweating: no Nausea: no Palpitations:no Hyperventilation: no Panic attacks: no Agoraphobia: no  Obscessions/compulsions: no Depressed mood: yes Depression screen [facility] 2/9 02/28/2020 05/16/2019 05/16/2019 02/08/2019 08/04/2018  Decreased Interest 0 0 0 0 0  Down, Depressed, Hopeless 1 0 0 1 1  PHQ - 2 Score 1 0 0 1 1  Altered sleeping 0 0 - 1 1    Tired, decreased energy 0 0 - 0 1  Change in appetite 0 0 - 0 0  Feeling bad or failure about yourself  0 0 - 0 0  Trouble concentrating 1 1 - 1 1  Moving slowly or fidgety/restless 0 0 - 0 0  Suicidal thoughts 0 0 - 0 0  PHQ-9 Score 2 1 - 3 4  Difficult doing work/chores Not difficult at all Not difficult at all - Not difficult at all Not difficult at all  Some recent data might be hidden   Anhedonia: no Weight changes: no Insomnia: no   Hypersomnia: no Fatigue/loss of energy: yes Feelings of worthlessness: yes Feelings of guilt: yes Impaired concentration/indecisiveness: no Suicidal ideations: no  Crying spells: yes Recent Stressors/Life Changes: yes   Relationship problems: no   Family stress: no     Financial stress: no    Job stress: no    Recent death/loss: no   Relevant past medical, surgical, family and social history reviewed and updated as indicated. Interim medical history since our last visit reviewed. Allergies and medications reviewed and updated.  Review of Systems  Constitutional: Negative.   Respiratory: Negative.   Cardiovascular: Negative.   Gastrointestinal: Negative.   Musculoskeletal: Negative.   Neurological: Negative.   Psychiatric/Behavioral: Negative.     Per HPI unless specifically indicated above     Objective:    BP 138/82 (BP Location: Left Arm, Cuff Size: Normal)   Pulse 81   Temp 98.5 F (36.9 C) (Oral)   Ht 5\' 2"  (1.575 m)   Wt 203 lb (92.1 kg)  SpO2 99%   BMI 37.13 kg/m   Wt Readings from Last 3 Encounters:  02/28/20 203 lb (92.1 kg)  05/16/19 204 lb (92.5 kg)  02/08/19 208 lb (94.3 kg)    Physical Exam Vitals and nursing note reviewed.  Constitutional:      General: She is not in acute distress.    Appearance: Normal appearance. She is not ill-appearing, toxic-appearing or diaphoretic.  HENT:     Head: Normocephalic and atraumatic.     Right Ear: External ear normal.     Left Ear: External ear normal.     Nose:  Nose normal.     Mouth/Throat:     Mouth: Mucous membranes are moist.     Pharynx: Oropharynx is clear.  Eyes:     General: No scleral icterus.       Right eye: No discharge.        Left eye: No discharge.     Extraocular Movements: Extraocular movements intact.     Conjunctiva/sclera: Conjunctivae normal.     Pupils: Pupils are equal, round, and reactive to light.  Cardiovascular:     Rate and Rhythm: Normal rate and regular rhythm.     Pulses: Normal pulses.     Heart sounds: Normal heart sounds. No murmur heard.  No friction rub. No gallop.   Pulmonary:     Effort: Pulmonary effort is normal. No respiratory distress.     Breath sounds: Normal breath sounds. No stridor. No wheezing, rhonchi or rales.  Chest:     Chest wall: No tenderness.  Musculoskeletal:        General: Normal range of motion.     Cervical back: Normal range of motion and neck supple.  Skin:    General: Skin is warm and dry.     Capillary Refill: Capillary refill takes less than 2 seconds.     Coloration: Skin is not jaundiced or pale.     Findings: No bruising, erythema, lesion or rash.  Neurological:     General: No focal deficit present.     Mental Status: She is alert and oriented to person, place, and time. Mental status is at baseline.  Psychiatric:        Mood and Affect: Mood normal.        Behavior: Behavior normal.        Thought Content: Thought content normal.        Judgment: Judgment normal.     Results for orders placed or performed in visit on 11/09/19  Urine Culture   Specimen: Urine   UR  Result Value Ref Range   Urine Culture, Routine Final report (A)    Organism ID, Bacteria Escherichia coli (A)    Antimicrobial Susceptibility Comment   Microscopic Examination   Urine  Result Value Ref Range   WBC, UA 11-30 (A) 0 - 5 /hpf   RBC 3-10 (A) 0 - 2 /hpf   Epithelial Cells (non renal) 0-10 0 - 10 /hpf   Bacteria, UA Many (A) None seen/Few  Urinalysis, Routine w reflex  microscopic  Result Value Ref Range   Specific Gravity, UA 1.020 1.005 - 1.030   pH, UA 6.5 5.0 - 7.5   Color, UA Yellow Yellow   Appearance Ur Hazy (A) Clear   Leukocytes,UA Trace (A) Negative   Protein,UA Negative Negative/Trace   Glucose, UA Negative Negative   Ketones, UA Negative Negative   RBC, UA Negative Negative   Bilirubin, UA Negative Negative  Urobilinogen, Ur 0.2 0.2 - 1.0 mg/dL   Nitrite, UA Positive (A) Negative   Microscopic Examination See below:       Assessment & Plan:   Problem List Items Addressed This Visit      Endocrine   IFG (impaired fasting glucose) - Primary    Rechecking labs today. Await results. Treat as needed.       Relevant Orders   Comprehensive metabolic panel   Hgb G3T w/o eAG     Genitourinary   Benign hypertensive renal disease    Under good control on current regimen. Continue current regimen. Continue to monitor. Call with any concerns. Refills given. Labs drawn today.        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 Await results. Treat as needed.       Relevant Orders   Comprehensive metabolic panel     Other   Morbid obesity ([place])    Encouraged diet and exercise with goal of losing 1-2lbs per week.       Depression    Continue current regimen. Continue to monitor. Refills given today. Klonopin should last 3-6+ months.       Anxiety    Continue current regimen. Continue to monitor. Refills given today. Klonopin should last 3-6+ months.       Dyslipidemia    Rechecking labs today. Await results. Treat as needed.       Relevant Orders   Comprehensive metabolic panel   Lipid Panel w/o Chol/HDL Ratio   Grief    Acting up. Continue current regimen. Continue to monitor. Refills given today. Klonopin should last 3-6+ months.       Relevant Orders   Referral to Chronic Care Management Services       Follow up  plan: Return in about 6 months (around 08/27/2020).

## 2020-02-29 ENCOUNTER — Telehealth: Payer: Self-pay

## 2020-02-29 NOTE — Assessment & Plan Note (Signed)
Rechecking labs today. Await results. Treat as needed.  °

## 2020-02-29 NOTE — Chronic Care Management (AMB) (Signed)
°  Care Management   Outreach Note  02/29/2020 Name: Jaime Freeman MRN: [id number] DOB: 11-19-1955  Referred by: Valerie Roys, DO Reason for referral : Care Coordination (outreach to schedule referral for LCSW )   An unsuccessful telephone outreach was attempted today. The patient was referred to the case management team for assistance with care management and care coordination.   Follow Up Plan: A HIPAA compliant phone message was left for the patient providing contact information and requesting a return call.  The care management team will reach out to the patient again over the next 7 days.  If patient returns call to provider office, please advise to call [place]  at [place], [place], [place], [place] [address] Direct Dial: [phone] [email or web address]: [email or web address]

## 2020-02-29 NOTE — Assessment & Plan Note (Signed)
Continue current regimen. Continue to monitor. Refills given today. Klonopin should last 3-6+ months.

## 2020-02-29 NOTE — Assessment & Plan Note (Signed)
Under good control on current regimen. Continue current regimen. Continue to monitor. Call with any concerns. Refills given. Labs drawn today.   

## 2020-02-29 NOTE — Assessment & Plan Note (Signed)
Acting up. Continue current regimen. Continue to monitor. Refills given today. Klonopin should last 3-6+ months.

## 2020-02-29 NOTE — Assessment & Plan Note (Signed)
Encouraged diet and exercise with goal of losing 1-2lbs per week.  

## 2020-03-06 NOTE — Chronic Care Management (AMB) (Signed)
  Care Management   Outreach Note  03/06/2020 Name: [name] MRN: [id number] DOB: 1956-04-08  Referred by: Valerie Roys, DO Reason for referral : Care Coordination (outreach to schedule referral for LCSW ) and Care Coordination (Second outreach to schedule referral for LCSW )   A second unsuccessful telephone outreach was attempted today. The patient was referred to the case management team for assistance with care management and care coordination.   Follow Up Plan: A HIPAA compliant phone message was left for the patient providing contact information and requesting a return call.  The care management team will reach out to the patient again over the next 7 days.  If patient returns call to provider office, please advise to call [facility] at [place], [place], [place], [place] [address] Direct Dial: [phone] [email or web address]: [email or web address]

## 2020-03-14 NOTE — Chronic Care Management (AMB) (Signed)
  Care Management   Outreach Note  03/14/2020 Name: Jaime Freeman MRN: [id number] DOB: 1955/06/08  Referred by: Valerie Roys, DO Reason for referral : Care Coordination (outreach to schedule referral for LCSW ), Care Coordination (Second outreach to schedule referral for LCSW ), and Care Coordination (Third outreach to schedule referral for LCSW )   Third unsuccessful telephone outreach was attempted today. The patient was referred to the case management team for assistance with care management and care coordination. The patient's primary care provider has been notified of our unsuccessful attempts to make or maintain contact with the patient. The care management team is pleased to engage with this patient at any time in the future should he/she be interested in assistance from the care management team.   Follow Up Plan: We have been unable to make contact with the patient for follow up. The care management team is available to follow up with the patient after provider conversation with the patient regarding recommendation for care management engagement and subsequent re-referral to the care management team.   Noreene Larsson, [place], [place], [place] [address] Direct Dial: [phone] [email or web address]

## 2020-04-29 ENCOUNTER — Encounter: Payer: Self-pay | Admitting: Family Medicine

## 2020-04-29 ENCOUNTER — Ambulatory Visit (INDEPENDENT_AMBULATORY_CARE_PROVIDER_SITE_OTHER): Payer: Self-pay | Admitting: Family Medicine

## 2020-04-29 DIAGNOSIS — Z20822 Contact with and (suspected) exposure to covid-19: Secondary | ICD-10-CM

## 2020-04-29 MED ORDER — METHYLPREDNISOLONE 4 MG PO TBPK: ORAL_TABLET | ORAL | 0 refills | Status: DC

## 2020-04-29 NOTE — Addendum Note (Signed)
Addended by: Enedina Finner on: 04/29/2020 11:21 AM   Modules accepted: Orders

## 2020-04-29 NOTE — Progress Notes (Signed)
There were no vitals taken for this visit.   Subjective:    Patient ID: Jaime Freeman, female    DOB: [birth year], 65 y.o.   MRN: [id number]  HPI: Jaime Freeman is a 65 y.o. female  Chief Complaint  Patient presents with  . Nasal Congestion    Pt states she is congested and has been coughing up yellow phlegm    UPPER RESPIRATORY TRACT INFECTION Duration: about 3 week ago, then started getting sick again yesterday Worst symptom: congestion Fever: no Cough: no Shortness of breath: yes Wheezing: yes Chest pain: no Chest tightness: yes Chest congestion: yes Nasal congestion: yes Runny nose: yes Post nasal drip: yes Sneezing: no Sore throat: no Swollen glands: no Sinus pressure: no Headache: no Face pain: no Toothache: no Ear pain: no  Ear pressure: no  Eyes red/itching:no Eye drainage/crusting: no  Vomiting: no Rash: no Fatigue: yes Sick contacts: yes Strep contacts: no  Context: better Recurrent sinusitis: no Relief with OTC cold/cough medications: no  Treatments attempted: none    Relevant past medical, surgical, family and social history reviewed and updated as indicated. Interim medical history since our last visit reviewed. Allergies and medications reviewed and updated.  Review of Systems  Constitutional: Negative.   HENT: Positive for congestion, postnasal drip and rhinorrhea. Negative for dental problem, drooling, ear discharge, ear pain, facial swelling, hearing loss, mouth sores, nosebleeds, sinus pressure, sinus pain, sneezing, sore throat, tinnitus, trouble swallowing and voice change.   Respiratory: Positive for shortness of breath and wheezing. Negative for apnea, cough, choking, chest tightness and stridor.   Cardiovascular: Negative.   Gastrointestinal: Negative.   Neurological: Negative.   Psychiatric/Behavioral: Negative.     Per HPI unless specifically indicated above     Objective:    There were no vitals taken for this visit.  Wt  Readings from Last 3 Encounters:  02/28/20 203 lb (92.1 kg)  05/16/19 204 lb (92.5 kg)  02/08/19 208 lb (94.3 kg)    Physical Exam Vitals and nursing note reviewed.  Pulmonary:     Effort: Pulmonary effort is normal. No respiratory distress.     Comments: Speaking in full sentences Neurological:     Mental Status: She is alert.  Psychiatric:        Mood and Affect: Mood normal.        Behavior: Behavior normal.        Thought Content: Thought content normal.        Judgment: Judgment normal.     Results for orders placed or performed in visit on 11/09/19  Urine Culture   Specimen: Urine   UR  Result Value Ref Range   Urine Culture, Routine Final report (A)    Organism ID, Bacteria Escherichia coli (A)    Antimicrobial Susceptibility Comment   Microscopic Examination   Urine  Result Value Ref Range   WBC, UA 11-30 (A) 0 - 5 /hpf   RBC 3-10 (A) 0 - 2 /hpf   Epithelial Cells (non renal) 0-10 0 - 10 /hpf   Bacteria, UA Many (A) None seen/Few  Urinalysis, Routine w reflex microscopic  Result Value Ref Range   Specific Gravity, UA 1.020 1.005 - 1.030   pH, UA 6.5 5.0 - 7.5   Color, UA Yellow Yellow   Appearance Ur Hazy (A) Clear   Leukocytes,UA Trace (A) Negative   Protein,UA Negative Negative/Trace   Glucose, UA Negative Negative   Ketones, UA Negative Negative   RBC, UA  Negative Negative   Bilirubin, UA Negative Negative   Urobilinogen, Ur 0.2 0.2 - 1.0 mg/dL   Nitrite, UA Positive (A) Negative   Microscopic Examination See below:       Assessment & Plan:   Problem List Items Addressed This Visit   None   Visit Diagnoses    Suspected COVID-19 virus infection    -  Primary   Will treat symptomatically with medrol. Will get swabbed. Self-quarantine until results are back. Continue to monitor. Call with any concerns.    Relevant Orders   Novel Coronavirus, NAA ([facility])       Follow up plan: Return if symptoms worsen or fail to improve.   . This visit was  completed via telephone due to the restrictions of the COVID-19 pandemic. All issues as above were discussed and addressed but no physical exam was performed. If it was felt that the patient should be evaluated in the office, they were directed there. The patient verbally consented to this visit. Patient was unable to complete an audio/visual visit due to Lack of equipment. Due to the catastrophic nature of the COVID-19 pandemic, this visit was done through audio contact only. . Location of the patient: home . Location of the provider: work . Those involved with this call:  . Provider: Park Liter, DO . CMA: Louanna Raw, [place] . Front Desk/Registration: Barth Kirks  . Time spent on call: 21 minutes on the phone discussing health concerns. 30 minutes total spent in review of patient's record and preparation of their chart.

## 2020-05-01 ENCOUNTER — Telehealth: Payer: Self-pay | Admitting: Family Medicine

## 2020-05-01 LAB — NOVEL CORONAVIRUS, NAA: SARS-CoV-2, NAA: NOT DETECTED

## 2020-05-01 LAB — SARS-COV-2, NAA 2 DAY TAT

## 2020-05-01 NOTE — Telephone Encounter (Signed)
Pt called stating that she has been feeling pretty good all day. She states; however, that she began to get really hot and run a fever. She states that PCP advised her that if her symptoms got worse she should call in for an antibiotic. Please advise.      [id number] [place] [place] - [address]  [address] [place] [address]  Phone: [phone] Fax: [phone]  Hours: Not open 24 hours

## 2020-05-02 NOTE — Telephone Encounter (Signed)
Pt Called to check if medication was sent to pharmacy/ pt stated that the only antibiotic she can take is Augmentin / Pt also stated that her temperature yesterday was 98.1 and she thought she had a fever but found out that was not the case /  please advise   If the Rx is sent before 6pm then send to  [place], [place] Phone:  [phone]  Fax:  [phone]    But if after 6pm [facility]

## 2020-05-02 NOTE — Telephone Encounter (Signed)
Message sent to PCP.

## 2020-05-05 ENCOUNTER — Other Ambulatory Visit: Payer: Self-pay | Admitting: Family Medicine

## 2020-05-05 DIAGNOSIS — F332 Major depressive disorder, recurrent severe without psychotic features: Secondary | ICD-10-CM

## 2020-05-05 MED ORDER — DOXYCYCLINE HYCLATE 100 MG PO TABS: 100.0000 mg | ORAL_TABLET | Freq: Two times a day (BID) | ORAL | 0 refills | Status: DC

## 2020-05-05 NOTE — Telephone Encounter (Signed)
Requested medication (s) are due for refill today:   Provider to determine  Requested medication (s) are on the active medication list:   Yes  Future visit scheduled:   Yes   Last ordered: 02/18/2020 #90, 0 refills  Non delegated refill   Requested Prescriptions  Pending Prescriptions Disp Refills   QUEtiapine (SEROQUEL) 25 MG tablet [Pharmacy Med Name: quetiapine 25 mg tablet] 90 tablet 0    Sig: TAKE 1 OR 2 TABLETS BY MOUTH AT BEDTIME & TAKE 1/2 additional TABLET ONCE DAILY AS NEEDED for anxiety up to three times a day      Not Delegated - Psychiatry:  Antipsychotics - Second Generation (Atypical) - quetiapine Failed - 05/05/2020 10:00 AM      Failed - This refill cannot be delegated      Failed - ALT in normal range and within 180 days    ALT  Date Value Ref Range Status  02/08/2019 12 0 - 32 IU/L Final          Failed - AST in normal range and within 180 days    AST  Date Value Ref Range Status  02/08/2019 18 0 - 40 IU/L Final          Passed - Completed PHQ-2 or PHQ-9 in the last 360 days      Passed - Last BP in normal range    BP Readings from Last 1 Encounters:  02/28/20 138/82          Passed - Valid encounter within last 6 months    Recent Outpatient Visits           6 days ago Suspected COVID-19 virus infection   [facility] [place], Noxon P, DO   2 months ago IFG (impaired fasting glucose)   [facility] [place], Megan P, DO   5 months ago [place], [place], DO   11 months ago Acute non-recurrent maxillary sinusitis   [facility] [place], [place], DO   11 months ago Cough   [place], [place], DO       Future Appointments             In 3 months [place], Barb Merino, DO [employer], [facility]

## 2020-05-21 ENCOUNTER — Telehealth: Payer: Self-pay | Admitting: Family Medicine

## 2020-05-21 NOTE — Telephone Encounter (Signed)
Please advise pt last seen 1/4 virtually for sinus issues

## 2020-05-21 NOTE — Telephone Encounter (Signed)
Copied from [place] [id number]. Topic: Quick Communication - Rx Refill/Question >> May 21, 2020  4:01 PM Erick Blinks wrote: Pt reports that she is "blowing out yellow mucus, I cant afford to get to sick from this. The only antibiotic that I can take is augmentin"   Best contact: [phone]  Pharmacy[id number] [place], [place] [address] [place] [address] Phone: [phone] Fax: [phone]  Requesting a call back if this request is declined by office

## 2020-05-22 NOTE — Telephone Encounter (Signed)
Will put on waitlist pt is aware and verbalized understanding.

## 2020-05-22 NOTE — Telephone Encounter (Signed)
Pt stated she can not afford another apt she states she has access one and already has a payment plan  I offered to mail her financial assitance and pt is willing to have apt  .Pt stated last time the antibiotic  did not work for her sinus, Pt stated scheduled for 05/26/20 for virtual asking if can be added on for today as all providers are booked.

## 2020-05-22 NOTE — Telephone Encounter (Signed)
appt

## 2020-05-22 NOTE — Telephone Encounter (Signed)
I unfortunately do not have anythign today. If I have a cancellation we may be able to get her seen

## 2020-05-23 ENCOUNTER — Encounter: Payer: Self-pay | Admitting: Nurse Practitioner

## 2020-05-23 ENCOUNTER — Telehealth (INDEPENDENT_AMBULATORY_CARE_PROVIDER_SITE_OTHER): Payer: Self-pay | Admitting: Nurse Practitioner

## 2020-05-23 DIAGNOSIS — J329 Chronic sinusitis, unspecified: Secondary | ICD-10-CM | POA: Insufficient documentation

## 2020-05-23 DIAGNOSIS — J0110 Acute frontal sinusitis, unspecified: Secondary | ICD-10-CM

## 2020-05-23 MED ORDER — AMOXICILLIN-POT CLAVULANATE 875-125 MG PO TABS: 1.0000 | ORAL_TABLET | Freq: Two times a day (BID) | ORAL | 0 refills | Status: DC

## 2020-05-23 NOTE — Progress Notes (Signed)
BP 135/72   Temp 97.6 F (36.4 C)    Subjective:    Patient ID: Jaime Freeman, female    DOB: 1955-09-21, 65 y.o.   MRN: [id number]  HPI: Jaime Freeman is a 65 y.o. female  Chief Complaint  Patient presents with  . sinus issues     Since the end of November  . Cough    Yellow phlegm, and yellow discharge from nose      . This visit was completed via telephone due to the restrictions of the COVID-19 pandemic. All issues as above were discussed and addressed but no physical exam was performed. If it was felt that the patient should be evaluated in the office, they were directed there. The patient verbally consented to this visit. Patient was unable to complete an audio/visual visit due to Technical difficulties, Lack of internet. Due to the catastrophic nature of the COVID-19 pandemic, this visit was done through audio contact only. . Location of the patient: home . Location of the provider: work . Those involved with this call:  . Provider: Marnee Guarneri, DNP . CMA: Frazier Butt, CMA . Front Desk/Registration: Jill Side  . Time spent on call: 21 minutes on the phone discussing health concerns. 15 minutes total spent in review of patient's record and preparation of their chart.  . I verified patient identity using two factors (patient name and date of birth). Patient consents verbally to being seen via telemedicine visit today.    UPPER RESPIRATORY TRACT INFECTION Has been having sinus issues since November.  Was seen in office 04/29/20 and given Medrol pack (did not take)  + Doxycycline -- was Covid negative.  Has gas heat at house.  States Doxycycline never works for her and only Augmentin works. Fever: no Cough: with drainage Shortness of breath: uses inhaler if issues with this which helps Wheezing: no Chest pain: no Chest tightness: no Chest congestion: no Nasal congestion: yes Runny nose: yes Post nasal drip: yes Sneezing: no Sore throat: no Swollen glands:  no Sinus pressure: yes Headache: no Face pain: yes Toothache: no Ear pain: none Ear pressure: yes bilateral Eyes red/itching:no Eye drainage/crusting: no  Vomiting: no Rash: no Fatigue: no Sick contacts: no Context: fluctuating Recurrent sinusitis: no Relief with OTC cold/cough medications: nothing Treatments attempted: cold/sinus, Doxcycline  Relevant past medical, surgical, family and social history reviewed and updated as indicated. Interim medical history since our last visit reviewed. Allergies and medications reviewed and updated.  Review of Systems  Constitutional: Positive for fatigue. Negative for activity change, appetite change and fever.  HENT: Positive for congestion, postnasal drip, rhinorrhea and sinus pressure. Negative for ear discharge, ear pain, facial swelling, sinus pain, sneezing, sore throat and voice change.   Eyes: Negative for pain and visual disturbance.  Respiratory: Negative for cough, chest tightness, shortness of breath and wheezing.   Cardiovascular: Negative for chest pain, palpitations and leg swelling.  Gastrointestinal: Negative.   Musculoskeletal: Negative for myalgias.  Neurological: Negative for dizziness, numbness and headaches.  Psychiatric/Behavioral: Negative.     Per HPI unless specifically indicated above     Objective:    BP 135/72   Temp 97.6 F (36.4 C)   Wt Readings from Last 3 Encounters:  02/28/20 203 lb (92.1 kg)  05/16/19 204 lb (92.5 kg)  02/08/19 208 lb (94.3 kg)    Physical Exam   Unable to assess due to telephone visit only.  Reports pain in frontal aspect.  Results for orders  placed or performed in visit on 04/29/20  Novel Coronavirus, NAA ([facility])   Specimen: Saline  Result Value Ref Range   SARS-CoV-2, NAA Not Detected Not Detected  SARS-COV-2, NAA 2 DAY TAT  Result Value Ref Range   SARS-CoV-2, NAA 2 DAY TAT Performed       Assessment & Plan:   Problem List Items Addressed This Visit       Respiratory   Sinusitis    Acute and ongoing.  Will send in script for Augmentin since no improvement with recent Doxycycline.  Is allergic to Prednisone.  Recommend taking Claritin or Allegra daily.  Recommend: - Increased rest - Increasing Fluids - Acetaminophen / ibuprofen as needed for fever/pain.  - Mucinex.  - Saline sinus flushes or a neti pot.  - Humidifying the air Return to office for worsening or ongoing symptoms.      Relevant Medications   amoxicillin-clavulanate (AUGMENTIN) 875-125 MG tablet      I discussed the assessment and treatment plan with the patient. The patient was provided an opportunity to ask questions and all were answered. The patient agreed with the plan and demonstrated an understanding of the instructions.   The patient was advised to call back or seek an in-person evaluation if the symptoms worsen or if the condition fails to improve as anticipated.   I provided 21+ minutes of time during this encounter.  Follow up plan: Return if symptoms worsen or fail to improve.

## 2020-05-23 NOTE — Assessment & Plan Note (Signed)
Acute and ongoing.  Will send in script for Augmentin since no improvement with recent Doxycycline.  Is allergic to Prednisone.  Recommend taking Claritin or Allegra daily.  Recommend: - Increased rest - Increasing Fluids - Acetaminophen / ibuprofen as needed for fever/pain.  - Mucinex.  - Saline sinus flushes or a neti pot.  - Humidifying the air Return to office for worsening or ongoing symptoms.

## 2020-05-23 NOTE — Patient Instructions (Signed)

## 2020-05-26 ENCOUNTER — Telehealth: Payer: Self-pay | Admitting: Family Medicine

## 2020-05-26 ENCOUNTER — Other Ambulatory Visit: Payer: Self-pay | Admitting: Family Medicine

## 2020-05-26 NOTE — Telephone Encounter (Signed)
Pt would like to be advised. Pt says that she feels that she has to have lab work before having a refill, pt would like to confirm this. Pt says that she is trying to avoid going without her medication.    CB: [phone]   Please assist pt further.

## 2020-05-26 NOTE — Telephone Encounter (Signed)
Medication: benazepril (LOTENSIN) 20 MG tablet [[id number]] , omeprazole (PRILOSEC) 20 MG capsule [[id number]]  DISCONTINUED,   Has the patient contacted their pharmacy? YES  (Agent: If no, request that the patient contact the pharmacy for the refill.) (Agent: If yes, when and what did the pharmacy advise?)  Preferred Pharmacy (with phone number or street name): [place], [place], [place] [address] Phone: [phone] Fax: [phone] Hours: Not open 24 hours    Agent: Please be advised that RX refills may take up to 3 business days. We ask that you follow-up with your pharmacy.

## 2020-05-26 NOTE — Telephone Encounter (Signed)
Called patient, no answer, unable to leave a message, will try again.   

## 2020-05-26 NOTE — Telephone Encounter (Signed)
Orders are already in. She needs to have her blood work done before she can get her refill. Please get blood work done.

## 2020-05-27 ENCOUNTER — Other Ambulatory Visit: Payer: Self-pay

## 2020-05-27 DIAGNOSIS — R7301 Impaired fasting glucose: Secondary | ICD-10-CM

## 2020-05-27 DIAGNOSIS — I129 Hypertensive chronic kidney disease with stage 1 through stage 4 chronic kidney disease, or unspecified chronic kidney disease: Secondary | ICD-10-CM

## 2020-05-27 DIAGNOSIS — E785 Hyperlipidemia, unspecified: Secondary | ICD-10-CM

## 2020-05-27 LAB — BAYER DCA HB A1C WAIVED: HB A1C (BAYER DCA - WAIVED): 5.4 % (ref <7.0)

## 2020-05-27 NOTE — Telephone Encounter (Signed)
Tried calling patient. No answer and no VM, will try to call again later.  

## 2020-05-28 ENCOUNTER — Encounter: Payer: Self-pay | Admitting: Family Medicine

## 2020-05-28 ENCOUNTER — Other Ambulatory Visit: Payer: Self-pay | Admitting: Family Medicine

## 2020-05-28 LAB — LIPID PANEL W/O CHOL/HDL RATIO
Cholesterol, Total: 207 mg/dL — ABNORMAL HIGH (ref 100–199)
HDL: 63 mg/dL (ref >39)
LDL Chol Calc (NIH): 128 mg/dL — ABNORMAL HIGH (ref 0–99)
Triglycerides: 87 mg/dL (ref 0–149)
VLDL Cholesterol Cal: 16 mg/dL (ref 5–40)

## 2020-05-28 LAB — COMPREHENSIVE METABOLIC PANEL
ALT: 12 IU/L (ref 0–32)
AST: 14 IU/L (ref 0–40)
Albumin/Globulin Ratio: 2 (ref 1.2–2.2)
Albumin: 4.4 g/dL (ref 3.8–4.8)
Alkaline Phosphatase: 71 IU/L (ref 44–121)
BUN/Creatinine Ratio: 20 (ref 12–28)
BUN: 14 mg/dL (ref 8–27)
Bilirubin Total: 0.4 mg/dL (ref 0.0–1.2)
CO2: 24 mmol/L (ref 20–29)
Calcium: 9.8 mg/dL (ref 8.7–10.3)
Chloride: 104 mmol/L (ref 96–106)
Creatinine, Ser: 0.69 mg/dL (ref 0.57–1.00)
GFR calc Af Amer: 107 mL/min/{1.73_m2} (ref >59)
GFR calc non Af Amer: 93 mL/min/{1.73_m2} (ref >59)
Globulin, Total: 2.2 g/dL (ref 1.5–4.5)
Glucose: 95 mg/dL (ref 65–99)
Potassium: 5.4 mmol/L — ABNORMAL HIGH (ref 3.5–5.2)
Sodium: 141 mmol/L (ref 134–144)
Total Protein: 6.6 g/dL (ref 6.0–8.5)

## 2020-05-28 NOTE — Telephone Encounter (Signed)
Patient has had blood work done

## 2020-05-29 ENCOUNTER — Other Ambulatory Visit: Payer: Self-pay

## 2020-05-29 MED ORDER — OMEPRAZOLE 20 MG PO CPDR: 20.0000 mg | DELAYED_RELEASE_CAPSULE | Freq: Every day | ORAL | 1 refills | Status: DC

## 2020-05-29 NOTE — Telephone Encounter (Signed)
Routing to provider. Patient requesting refill for Omeprazole 20 mg

## 2020-06-10 ENCOUNTER — Other Ambulatory Visit: Payer: Self-pay | Admitting: Nurse Practitioner

## 2020-06-10 ENCOUNTER — Telehealth: Payer: Self-pay | Admitting: Family Medicine

## 2020-06-10 MED ORDER — AMOXICILLIN-POT CLAVULANATE 875-125 MG PO TABS: 1.0000 | ORAL_TABLET | Freq: Two times a day (BID) | ORAL | 0 refills | Status: AC

## 2020-06-10 NOTE — Telephone Encounter (Signed)
Would need appointment. Has been treated x 2 -- once with Doxycycline and once with Augmentin.  Would need face to face office office visit for further assessment due to ongoing symptoms -- with any provider in office.

## 2020-06-10 NOTE — Telephone Encounter (Signed)
Called pt to schedule she states that she has previously went to a walk in clinic and was giving 14 days of medicine and it worked for her. She states that she needs more than 7 days saying that it was almost cleared but it wasn't enough to run it's course. She states that she can't keep spending money having all of these appointments and not getting well. She is wanting to know what will be done at the face to face visit, states that she will come in if need be but she wants to make sure that she is taking care of. Please advise.

## 2020-06-10 NOTE — Telephone Encounter (Signed)
Pt does not want to make an appt. Pt is still having yellow mucus coming from her sinuses. Pt seen jolene on 05-23-2020 for sinus infection and would like to know if jolene would give her refill on augmentin . [facility] [address] in [place]

## 2020-06-10 NOTE — Telephone Encounter (Signed)
Patient notified and verbalized understanding. 

## 2020-06-10 NOTE — Telephone Encounter (Signed)
I have sent in 7 more days, however if this does improve then she will need visit in office please as assessment would be beneficial.

## 2020-06-20 ENCOUNTER — Telehealth: Payer: Self-pay

## 2020-06-20 ENCOUNTER — Other Ambulatory Visit: Payer: Self-pay | Admitting: Nurse Practitioner

## 2020-06-20 MED ORDER — FLUCONAZOLE 150 MG PO TABS: 150.0000 mg | ORAL_TABLET | Freq: Once | ORAL | 0 refills | Status: AC

## 2020-06-20 NOTE — Telephone Encounter (Signed)
Diflucan sent in for patient.

## 2020-06-20 NOTE — Telephone Encounter (Signed)
Please advise pt last seen 1/28  Copied from [facility] #[id number]. Topic: General - Other >> Jun 20, 2020  8:54 AM [name] A wrote: Reason for CRM: Patient believes they have a yeast infection as a result of recently taking antibiotics    Patient would like to be prescribed something to help ease the discomfort   Patient declined to make an appointment at the time of call with agent  Please contact to advise

## 2020-06-20 NOTE — Telephone Encounter (Signed)
Called pt advised rx sent pt verbalized understanding

## 2020-06-26 ENCOUNTER — Ambulatory Visit: Payer: Self-pay | Admitting: Nurse Practitioner

## 2020-06-30 ENCOUNTER — Encounter: Payer: Self-pay | Admitting: Family Medicine

## 2020-06-30 ENCOUNTER — Ambulatory Visit: Payer: Self-pay | Admitting: Family Medicine

## 2020-06-30 ENCOUNTER — Telehealth: Payer: Self-pay | Admitting: Family Medicine

## 2020-06-30 ENCOUNTER — Other Ambulatory Visit: Payer: Self-pay

## 2020-06-30 VITALS — BP 135/85 | HR 70 | Temp 98.0°F | Wt 201.8 lb

## 2020-06-30 DIAGNOSIS — Z23 Encounter for immunization: Secondary | ICD-10-CM

## 2020-06-30 DIAGNOSIS — B372 Candidiasis of skin and nail: Secondary | ICD-10-CM

## 2020-06-30 DIAGNOSIS — M7661 Achilles tendinitis, right leg: Secondary | ICD-10-CM

## 2020-06-30 MED ORDER — NYSTATIN 100000 UNIT/GM EX POWD: 1.0000 "application " | Freq: Three times a day (TID) | CUTANEOUS | 3 refills | Status: DC

## 2020-06-30 NOTE — Telephone Encounter (Signed)
Pharmacy called to ask the nurse or doctor to call with clarification about patient's medication for nystatin (MYCOSTATIN/NYSTOP) powder.  Stated that they need to know where the powder is being applied.  Please call to discuss at [phone]

## 2020-06-30 NOTE — Patient Instructions (Addendum)
Rosen's Emergency Medicine: Concepts and Clinical Practice (9th ed., pp. 1392-1401). [place], [place]: Elsevier[employer] Inc. Retrieved from https://www.clinicalkey.com/#!/content/book/3-[email or web address]-[email or web address]?scrollTo=%23hl0000251">  Achilles Tendinitis  Achilles tendinitis is inflammation of the tough, cord-like band that attaches the lower leg muscles to the heel bone (Achilles tendon). This is usually caused by overusing the tendon and the ankle joint. Achilles tendinitis usually gets better over time with treatment and caring for yourself at home. It can take weeks or months to heal completely. What are the causes? This condition may be caused by:  A sudden increase in exercise or activity, such as running.  Doing the same exercises or activities, such as jumping, over and over.  Not warming up calf muscles before exercising.  Exercising in shoes that are worn out or not made for exercise.  Having arthritis or a bone growth (spur) on the back of the heel bone. This can rub against the tendon and hurt it.  Age-related wear and tear. Tendons become less flexible with age and are more likely to be injured. What are the signs or symptoms? Common symptoms of this condition include:  Pain in the Achilles tendon or in the back of the leg, just above the heel. The pain usually gets worse with exercise.  Stiffness or soreness in the back of the leg, especially in the morning.  Swelling of the skin over the Achilles tendon.  Thickening of the tendon.  Trouble standing on tiptoe. How is this diagnosed? This condition is diagnosed based on your symptoms and a physical exam. You may have tests, including:  X-rays.  MRI. How is this treated? The goal of treatment is to relieve symptoms and help your injury heal. Treatment may include:  Decreasing or stopping activities that caused the tendinitis. This may mean switching to low-impact exercises like biking or swimming.  Icing the injured  area.  Doing physical therapy, including strengthening and stretching exercises.  Taking NSAIDs, such as ibuprofen, to help relieve pain and swelling.  Using supportive shoes, wraps, heel lifts, or a walking boot (air cast).  Having surgery. This may be done if your symptoms do not improve after other treatments.  Using high-energy shock wave impulses to stimulate the healing process (extracorporeal shock wave therapy). This is rare.  Having an injection of medicines that help relieve inflammation (corticosteroids). This is rare. Follow these instructions at home: If you have an air cast:  Wear the air cast as told by your health care provider. Remove it only as told by your health care provider.  Loosen it if your toes tingle, become numb, or turn cold and blue.  Keep it clean.  If the air cast is not waterproof: ? Do not let it get wet. ? Cover it with a watertight covering when you take a bath or shower. Managing pain, stiffness, and swelling  If directed, put ice on the injured area. To do this: ? If you have a removable air cast, remove it as told by your health care provider. ? Put ice in a plastic bag. ? Place a towel between your skin and the bag. ? Leave the ice on for 20 minutes, 2-3 times a day.  Move your toes often to reduce stiffness and swelling.  Raise (elevate) your foot above the level of your heart while you are sitting or lying down.   Activity  Gradually return to your normal activities as told by your health care provider. Ask your health care provider what activities are safe for you.  Do not   do activities that cause pain.  Consider doing low-impact exercises, like cycling or swimming.  Ask your health care provider when it is safe to drive if you have an air cast on your foot.  If physical therapy was prescribed, do exercises as told by your health care provider or physical therapist. General instructions  If directed, wrap your foot with an  elastic bandage or other wrap. This can help to keep your tendon from moving too much while it heals. Your health care provider will show you how to wrap your foot correctly.  Wear supportive shoes or heel lifts only as told by your health care provider.  Take over-the-counter and prescription medicines only as told by your health care provider.  Keep all follow-up visits as told by your health care provider. This is important. Contact a health care provider if you:  Have symptoms that get worse.  Have pain that does not get better with medicine.  Develop new, unexplained symptoms.  Develop warmth and swelling in your foot.  Have a fever. Get help right away if you:  Have a sudden popping sound or sensation in your Achilles tendon followed by severe pain.  Cannot move your toes or foot.  Cannot put any weight on your foot.  Your foot or toes become numb and look white or blue even after loosening your bandage or air cast. Summary  Achilles tendinitis is inflammation of the tough, cord-like band that attaches the lower leg muscles to the heel bone (Achilles tendon).  This condition is usually caused by overusing the tendon and the ankle joint. It can also be caused by arthritis or normal aging.  The most common symptoms of this condition include pain, swelling, or stiffness in the Achilles tendon or in the back of the leg.  This condition is usually treated by decreasing or stopping activities that caused the tendinitis, icing the injured area, taking NSAIDs, and doing physical therapy. This information is not intended to replace advice given to you by your health care provider. Make sure you discuss any questions you have with your health care provider. Document Revised: 08/28/2018 Document Reviewed: 08/28/2018 Elsevier Patient Education  [place].  Achilles Tendon Tear, Phase I Rehab Ask your health care provider which exercises are safe for you. Do exercises  exactly as told by your health care provider and adjust them as directed. It is normal to feel mild stretching, pulling, tightness, or discomfort as you do these exercises. Stop right away if you feel sudden pain or your pain gets worse. Do not begin these exercises until told by your health care provider. Stretching and range-of-motion exercises These exercises warm up your muscles and joints and improve the movement and flexibility of your lower leg and heel. These exercises also help to relieve pain. Dorsiflexion and plantar flexion 1. Sit with your left / right knee straight or bent. Do not rest your foot on anything. 2. Move your left / right ankle to tilt the top of your foot toward your shin (dorsiflexion). Stop at the first point of resistance or at the angle where your health care provider told you to stop. 3. Hold this position for __________ seconds. 4. Point your toes downward to tilt the top of your foot away from your shin (plantar flexion). 5. Hold this position for __________ seconds. Repeat __________ times. Complete this exercise __________ times a day.   Ankle alphabet 1. Sit with your left / right leg supported at the lower leg. ?  Do not rest your foot on anything. ? Make sure your foot has room to move freely. 2. Think of your left / right foot as a paintbrush, and move your foot to trace each letter of the alphabet in the air. Keep your hip and knee still while you trace the letters. 3. Trace every letter of the alphabet. Repeat __________ times. Complete this exercise __________ times a day.   Strengthening exercises These exercises build strength and endurance in your lower leg. Endurance is the ability to use your muscles for a long time, even after they get tired. Dorsiflexion 1. Secure a rubber exercise band or tube to an object, such as a table leg, that will stay still when the band is pulled. Secure the other end around your left / right foot. 2. Sit on the floor  facing the object (table leg) with your left / right leg extended (straight) and your toes pointing down. The band or tube should be slightly tense when your foot is relaxed. 3. Slowly bend your left / right ankle and toes to bring the top of your foot toward your shin (dorsiflexion). Stop when you feel resistance in your Achilles tendon, or stop at the angle where your health care provider told you to stop. 4. Hold this position for __________ seconds. 5. Slowly return your foot to the starting position. Repeat __________ times. Complete this exercise __________ times a day.   Eversion 1. Sit on the floor with your legs straight out in front of you. 2. Loop a rubber exercise band around your left / right foot, across the top part of the walking surface (ball) of that foot. Hold the band in your hands or secure it to a stable object. 3. Slowly push your foot outward, away from your other leg (eversion). 4. Hold this position for __________ seconds. 5. Slowly return your foot to the starting position. Repeat __________ times. Complete this exercise __________ times a day. Inversion 1. Sit on the floor with your legs straight out in front of you. 2. Loop a rubber exercise band around your left / right foot, across the top part of the walking surface (ball) of that foot. Hold the band in your hands or secure it to a stable object. 3. Slowly push your foot inward, toward your other leg (inversion). 4. Hold this position for __________ seconds. 5. Slowly return your foot to the starting position. Repeat __________ times. Complete this exercise __________ times a day. Plantar flexion while seated 1. Sit on a chair with your feet flat on the floor. 2. If told by your health care provider, place __________ lb on your left / right knee. 3. Keeping your toes firmly on the floor, lift your left / right heel as far as you can without increasing discomfort in your ankle. This pushes your foot away from you  (plantar flexion). 4. Bring your heel back down to the floor. Repeat __________ times. Complete this exercise __________ times a day.   This information is not intended to replace advice given to you by your health care provider. Make sure you discuss any questions you have with your health care provider. Document Revised: 07/31/2018 Document Reviewed: 01/23/2018 Elsevier Patient Education  2021 [employer].

## 2020-06-30 NOTE — Progress Notes (Signed)
BP 135/85   Pulse 70   Temp 98 F (36.7 C)   Wt 201 lb 12.8 oz (91.5 kg)   SpO2 97%   BMI 36.91 kg/m    Subjective:    Patient ID: Jaime Freeman, female    DOB: Mar 06, 1956, 65 y.o.   MRN: [id number]  HPI: Jaime Freeman is a 65 y.o. female  Chief Complaint  Patient presents with  . skin concern    Patient states she believes she has a yeast infection on her stomach   . Edema    Patient states the area behind her ankle on the right leg is swollen    SKIN INFECTION Duration: 1 month Location: under pannus History of trauma in area: no Pain: no Quality: burning Severity: mild Redness: yes Swelling: no Oozing: no Pus: no Fevers: no Nausea/vomiting: no Status: stable Treatments attempted:gold-bond  Tetanus: No Tdap or Td in health maintenace  FOOT PAIN Duration: couple of weeks Involved foot: right Mechanism of injury: unknown Location: heel  Onset: sudden  Severity: moderate  Quality:  Aching and sore Frequency: constant Radiation: yes into the bottom of her foot Aggravating factors: weight bearing and walking  Alleviating factors:rest   Status: worse Treatments attempted:rest   Relief with NSAIDs?:  No NSAIDs Taken Weakness with weight bearing or walking: no Morning stiffness: no Swelling: yes Redness: no Bruising: no Paresthesias / decreased sensation: no  Fevers:no   Relevant past medical, surgical, family and social history reviewed and updated as indicated. Interim medical history since our last visit reviewed. Allergies and medications reviewed and updated.  Review of Systems  Constitutional: Negative.   HENT: Negative.   Respiratory: Negative.   Cardiovascular: Positive for leg swelling. Negative for chest pain and palpitations.  Gastrointestinal: Negative.   Musculoskeletal: Negative.   Neurological: Negative.   Psychiatric/Behavioral: Negative.     Per HPI unless specifically indicated above     Objective:    BP 135/85    Pulse 70   Temp 98 F (36.7 C)   Wt 201 lb 12.8 oz (91.5 kg)   SpO2 97%   BMI 36.91 kg/m   Wt Readings from Last 3 Encounters:  06/30/20 201 lb 12.8 oz (91.5 kg)  02/28/20 203 lb (92.1 kg)  05/16/19 204 lb (92.5 kg)    Physical Exam Vitals and nursing note reviewed.  Constitutional:      General: She is not in acute distress.    Appearance: Normal appearance. She is not ill-appearing, toxic-appearing or diaphoretic.  HENT:     Head: Normocephalic and atraumatic.     Right Ear: External ear normal.     Left Ear: External ear normal.     Nose: Nose normal.     Mouth/Throat:     Mouth: Mucous membranes are moist.     Pharynx: Oropharynx is clear.  Eyes:     General: No scleral icterus.       Right eye: No discharge.        Left eye: No discharge.     Extraocular Movements: Extraocular movements intact.     Conjunctiva/sclera: Conjunctivae normal.     Pupils: Pupils are equal, round, and reactive to light.  Cardiovascular:     Rate and Rhythm: Normal rate and regular rhythm.     Pulses: Normal pulses.     Heart sounds: Normal heart sounds. No murmur heard. No friction rub. No gallop.   Pulmonary:     Effort: Pulmonary effort is normal. No  respiratory distress.     Breath sounds: Normal breath sounds. No stridor. No wheezing, rhonchi or rales.  Chest:     Chest wall: No tenderness.  Musculoskeletal:        General: Normal range of motion.     Cervical back: Normal range of motion and neck supple.  Skin:    General: Skin is warm and dry.     Capillary Refill: Capillary refill takes less than 2 seconds.     Coloration: Skin is not jaundiced or pale.     Findings: Erythema (erythematous rash under her pannus) present. No bruising, lesion or rash.  Neurological:     General: No focal deficit present.     Mental Status: She is alert and oriented to person, place, and time. Mental status is at baseline.  Psychiatric:        Mood and Affect: Mood normal.        Behavior:  Behavior normal.        Thought Content: Thought content normal.        Judgment: Judgment normal.     Results for orders placed or performed in visit on 05/27/20  Bayer DCA Hb A1c Waived  Result Value Ref Range   HB A1C (BAYER DCA - WAIVED) 5.4 <7.0 %  Comprehensive metabolic panel  Result Value Ref Range   Glucose 95 65 - 99 mg/dL   BUN 14 8 - 27 mg/dL   Creatinine, Ser 0.69 0.57 - 1.00 mg/dL   GFR calc non Af Amer 93 >59 mL/min/1.73   GFR calc Af Amer 107 >59 mL/min/1.73   BUN/Creatinine Ratio 20 12 - 28   Sodium 141 134 - 144 mmol/L   Potassium 5.4 (H) 3.5 - 5.2 mmol/L   Chloride 104 96 - 106 mmol/L   CO2 24 20 - 29 mmol/L   Calcium 9.8 8.7 - 10.3 mg/dL   Total Protein 6.6 6.0 - 8.5 g/dL   Albumin 4.4 3.8 - 4.8 g/dL   Globulin, Total 2.2 1.5 - 4.5 g/dL   Albumin/Globulin Ratio 2.0 1.2 - 2.2   Bilirubin Total 0.4 0.0 - 1.2 mg/dL   Alkaline Phosphatase 71 44 - 121 IU/L   AST 14 0 - 40 IU/L   ALT 12 0 - 32 IU/L  Lipid Panel w/o Chol/HDL Ratio  Result Value Ref Range   Cholesterol, Total 207 (H) 100 - 199 mg/dL   Triglycerides 87 0 - 149 mg/dL   HDL 63 >39 mg/dL   VLDL Cholesterol Cal 16 5 - 40 mg/dL   LDL Chol Calc (NIH) 128 (H) 0 - 99 mg/dL      Assessment & Plan:   Problem List Items Addressed This Visit   None   Visit Diagnoses    Achilles tendinitis of right lower extremity    -  Primary   Will work on stretches for her ankle. Call with any concerns or if not getting better.    Candidal intertrigo       Will treat with nystatin. Call with any concerns. Conitnue to monitor.    Relevant Medications   nystatin (MYCOSTATIN/NYSTOP) powder       Follow up plan: Return May.

## 2020-06-30 NOTE — Telephone Encounter (Signed)
Per Jaime Freeman the area is on her stomach, pharmacy notified.

## 2020-08-25 ENCOUNTER — Ambulatory Visit: Payer: Self-pay | Admitting: *Deleted

## 2020-08-25 NOTE — Telephone Encounter (Signed)
Pt called stating dizziness and weakness; episode of 08/08/20 - 08/13/20; she had a low grade temp and a negative COVID test; she resumed bleaching on 08/18/20  and her symptoms returned 54/22; she says she feels faint, funny, and "don't feel right"; her BP was 146/74 about 08/12/20; she reports increased thirst and says she has not had enough to drink in the past 3 days; recommendations made per nurse triage protocol; the pt verbalized understanding and says that she does not want to go to the ED; she is seen by DR Park Liter, [facility]; notified Collene Mares and she requests this information be sent to the office for provider review; the will call the pt back; pt notified and can be contacted at [phone]; will route per request.   Reason for Disposition . [1] Drinking very little AND [2] dehydration suspected (e.g., no urine > 12 hours, very dry mouth, very lightheaded)  Answer Assessment - Initial Assessment Questions 1. DESCRIPTION: "Describe your dizziness."     Light headed 2. LIGHTHEADED: "Do you feel lightheaded?" (e.g., somewhat faint, woozy, weak upon standing)   "feels like has no energy"; feels off balance 3. VERTIGO: "Do you feel like either you or the room is spinning or tilting?" (i.e. vertigo)     no 4. SEVERITY: "How bad is it?"  "Do you feel like you are going to faint?" "Can you stand and walk?"   - MILD: Feels slightly dizzy, but walking normally.   - MODERATE: Feels very unsteady when walking, but not falling; interferes with normal activities (e.g., school, work) .   - SEVERE: Unable to walk without falling, or requires assistance to walk without falling; feels like passing out now.      mild 5. ONSET:  "When did the dizziness begin?"     08/27/20 6. AGGRAVATING FACTORS: "Does anything make it worse?" (e.g., standing, change in head position)     Turning head side to side 7. HEART RATE: "Can you tell me your heart rate?" "How many beats in 15 seconds?"  (Note: not all  patients can do this)       Not sure 8. CAUSE: "What do you think is causing the dizziness?"     ? Bleaching teeth 9. RECURRENT SYMPTOM: "Have you had dizziness before?" If Yes, ask: "When was the last time?" "What happened that time?"     no 10. OTHER SYMPTOMS: "Do you have any other symptoms?" (e.g., fever, chest pain, vomiting, diarrhea, bleeding)      Intermittent nHigh pitched sounds in both ears, weakness 11. PREGNANCY: "Is there any chance you are pregnant?" "When was your last menstrual period?"  Protocols used: DIZZINESS [facility]

## 2020-08-25 NOTE — Telephone Encounter (Signed)
Pt scheduled for 08/26/2020 with Dr.Johnson pt stated she was feeling better but wanted to be seen.

## 2020-08-26 ENCOUNTER — Encounter: Payer: Self-pay | Admitting: Family Medicine

## 2020-08-26 ENCOUNTER — Ambulatory Visit (INDEPENDENT_AMBULATORY_CARE_PROVIDER_SITE_OTHER): Payer: Self-pay | Admitting: Family Medicine

## 2020-08-26 ENCOUNTER — Other Ambulatory Visit: Payer: Self-pay

## 2020-08-26 VITALS — Wt 199.2 lb

## 2020-08-26 DIAGNOSIS — E559 Vitamin D deficiency, unspecified: Secondary | ICD-10-CM

## 2020-08-26 DIAGNOSIS — E538 Deficiency of other specified B group vitamins: Secondary | ICD-10-CM

## 2020-08-26 DIAGNOSIS — R8290 Unspecified abnormal findings in urine: Secondary | ICD-10-CM

## 2020-08-26 DIAGNOSIS — N181 Chronic kidney disease, stage 1: Secondary | ICD-10-CM

## 2020-08-26 DIAGNOSIS — K760 Fatty (change of) liver, not elsewhere classified: Secondary | ICD-10-CM

## 2020-08-26 DIAGNOSIS — F332 Major depressive disorder, recurrent severe without psychotic features: Secondary | ICD-10-CM

## 2020-08-26 DIAGNOSIS — I129 Hypertensive chronic kidney disease with stage 1 through stage 4 chronic kidney disease, or unspecified chronic kidney disease: Secondary | ICD-10-CM

## 2020-08-26 DIAGNOSIS — F419 Anxiety disorder, unspecified: Secondary | ICD-10-CM

## 2020-08-26 DIAGNOSIS — E785 Hyperlipidemia, unspecified: Secondary | ICD-10-CM

## 2020-08-26 DIAGNOSIS — H8112 Benign paroxysmal vertigo, left ear: Secondary | ICD-10-CM

## 2020-08-26 DIAGNOSIS — R7301 Impaired fasting glucose: Secondary | ICD-10-CM

## 2020-08-26 LAB — URINALYSIS, ROUTINE W REFLEX MICROSCOPIC
Bilirubin, UA: NEGATIVE
Glucose, UA: NEGATIVE
Ketones, UA: NEGATIVE
Leukocytes,UA: NEGATIVE
Nitrite, UA: NEGATIVE
Protein,UA: NEGATIVE
RBC, UA: NEGATIVE
Specific Gravity, UA: >1.03 — ABNORMAL HIGH (ref 1.005–1.030)
Urobilinogen, Ur: 0.2 mg/dL (ref 0.2–1.0)
pH, UA: 5.5 (ref 5.0–7.5)

## 2020-08-26 LAB — BAYER DCA HB A1C WAIVED: HB A1C (BAYER DCA - WAIVED): 5.7 % (ref <7.0)

## 2020-08-26 MED ORDER — ETODOLAC 400 MG PO TABS: 400.0000 mg | ORAL_TABLET | Freq: Two times a day (BID) | ORAL | 1 refills | Status: DC | PRN

## 2020-08-26 MED ORDER — BENAZEPRIL HCL 20 MG PO TABS: 20.0000 mg | ORAL_TABLET | Freq: Every day | ORAL | 1 refills | Status: DC

## 2020-08-26 MED ORDER — ALBUTEROL SULFATE HFA 108 (90 BASE) MCG/ACT IN AERS: 2.0000 | INHALATION_SPRAY | RESPIRATORY_TRACT | 3 refills | Status: DC | PRN

## 2020-08-26 NOTE — Patient Instructions (Signed)
How to Perform the Epley Maneuver The Epley maneuver is an exercise that relieves symptoms of vertigo. Vertigo is the feeling that you or your surroundings are moving when they are not. When you feel vertigo, you may feel like the room is spinning and may have trouble walking. The Epley maneuver is used for a type of vertigo caused by a calcium deposit in a part of the inner ear. The maneuver involves changing head positions to help the deposit move out of the area. You can do this maneuver at home whenever you have symptoms of vertigo. You can repeat it in 24 hours if your vertigo has not gone away. Even though the Epley maneuver may relieve your vertigo for a few weeks, it is possible that your symptoms will return. This maneuver relieves vertigo, but it does not relieve dizziness. What are the risks? If it is done correctly, the Epley maneuver is considered safe. Sometimes it can lead to dizziness or nausea that goes away after a short time. If you develop other symptoms--such as changes in vision, weakness, or numbness--stop doing the maneuver and call your health care provider. Supplies needed:  A bed or table.  A pillow. How to do the Epley maneuver 1. Sit on the edge of a bed or table with your back straight and your legs extended or hanging over the edge of the bed or table. 2. Turn your head halfway toward the affected ear or side as told by your health care provider. 3. Lie backward quickly with your head turned until you are lying flat on your back. You may want to position a pillow under your shoulders. 4. Hold this position for at least 30 seconds. If you feel dizzy or have symptoms of vertigo, continue to hold the position until the symptoms stop. 5. Turn your head to the opposite direction until your unaffected ear is facing the floor. 6. Hold this position for at least 30 seconds. If you feel dizzy or have symptoms of vertigo, continue to hold the position until the symptoms  stop. 7. Turn your whole body to the same side as your head so that you are positioned on your side. Your head will now be nearly facedown. Hold for at least 30 seconds. If you feel dizzy or have symptoms of vertigo, continue to hold the position until the symptoms stop. 8. Sit back up. You can repeat the maneuver in 24 hours if your vertigo does not go away.      Follow these instructions at home: For 24 hours after doing the Epley maneuver:  Keep your head in an upright position.  When lying down to sleep or rest, keep your head raised (elevated) with two or more pillows.  Avoid excessive neck movements. Activity  Do not drive or use machinery if you feel dizzy.  After doing the Epley maneuver, return to your normal activities as told by your health care provider. Ask your health care provider what activities are safe for you. General instructions  Drink enough fluid to keep your urine pale yellow.  Do not drink alcohol.  Take over-the-counter and prescription medicines only as told by your health care provider.  Keep all follow-up visits as told by your health care provider. This is important. Preventing vertigo symptoms Ask your health care provider if there is anything you should do at home to prevent vertigo. He or she may recommend that you:  Keep your head elevated with two or more pillows while you sleep.    Do not sleep on the side of your affected ear.  Get up slowly from bed.  Avoid sudden movements during the day.  Avoid extreme head positions or movement, such as looking up or bending over. Contact a health care provider if:  Your vertigo gets worse.  You have other symptoms, including: ? Nausea. ? Vomiting. ? Headache. Get help right away if you:  Have vision changes.  Have a headache or neck pain that is severe or getting worse.  Cannot stop vomiting.  Have new numbness or weakness in any part of your body. Summary  Vertigo is the feeling that  you or your surroundings are moving when they are not.  The Epley maneuver is an exercise that relieves symptoms of vertigo.  If the Epley maneuver is done correctly, it is considered safe and relieves vertigo quickly. This information is not intended to replace advice given to you by your health care provider. Make sure you discuss any questions you have with your health care provider. Document Revised: 02/07/2019 Document Reviewed: 02/07/2019 Elsevier Patient Education  2021 [employer].  

## 2020-08-26 NOTE — Assessment & Plan Note (Signed)
Doing well with A1c of 5.7 continue current regimen. Continue to monitor. Call with any concerns.

## 2020-08-26 NOTE — Assessment & Plan Note (Signed)
Rechecking labs today. Await results. Treat as needed.  °

## 2020-08-26 NOTE — Progress Notes (Signed)
Wt 199 lb 3.2 oz (90.4 kg)   SpO2 97%   BMI 36.43 kg/m    Subjective:    Patient ID: Jaime Freeman, female    DOB: Apr 21, 1956, 65 y.o.   MRN: [id number]  HPI: Jaime Freeman is a 65 y.o. female  Chief Complaint  Patient presents with  . IFG  . Depression  . Anxiety  . Chronic Kidney Disease  . Obesity  . Dizziness    Patient states about three weeks ago she was sick, had fever and ringing in the ears. Patient states during that time she was feeling dizzy. Patient states she took an at home test and it was negative. Patient was feeling dizzy yesterday as well.   . urine concerns    Patient states for about a month she has noticed her urine has been dark. Occasionally has a strong odor.   . Insomnia    Patient states at night she wakes up during the night and cant go back to sleep    DIZZINESS- about 3 weeks ago, she started feeling dizzy after doing a lot of cleaning. She was very tired and she was running a fever up to 102. She had a negative COVID test, she felt better with this, but yesterday she felt fatigued, but not dizzy Duration: 3 weeks Description of symptoms: room spinning Duration of episode: 3 days Dizziness frequency: recurrent Triggered by rolling over in bed: no Triggered by bending over: yes Aggravated by head movement: yes Aggravated by exertion, coughing, loud noises: no Recent head injury: no Recent or current viral symptoms: yes History of vasovagal episodes: no Nausea: yes Vomiting: no Tinnitus: yes Hearing loss: yes Aural fullness: yes Headache: yes Photophobia/phonophobia: yes Unsteady gait: yes Postural instability: no Diplopia, dysarthria, dysphagia or weakness: no Related to exertion: yes Pallor: no Diaphoresis: yes Dyspnea: no Chest pain: no  HYPERTENSION / HYPERLIPIDEMIA Satisfied with current treatment? yes Duration of hypertension: chronic BP monitoring frequency: not checking BP medication side effects: no Past BP meds:  benazepril Duration of hyperlipidemia: chronic Cholesterol medication side effects: no Cholesterol supplements: none Past cholesterol medications: none Medication compliance: excellent compliance Aspirin: yes Recent stressors: yes Recurrent headaches: no Visual changes: no Palpitations: yes Dyspnea: no Chest pain: no Lower extremity edema: no Dizzy/lightheaded: yes  Impaired Fasting Glucose HbA1C:  Lab Results  Component Value Date   HGBA1C 5.7 08/26/2020   Duration of elevated blood sugar: chronic Polydipsia: yes Polyuria: no Weight change: no Visual disturbance: no Glucose Monitoring: no  Diabetic Education: Not Completed Family history of diabetes: yes  URINARY SYMPTOMS Duration: a couple of months Dysuria: no Urinary frequency: no Urgency: no Small volume voids: no Symptom severity: moderate Urinary incontinence: no Foul odor: yes Hematuria: no Abdominal pain: no Back pain: no Suprapubic pain/pressure: no Flank pain: no Fever:  yes- about 3 weeks ago, nothing now Vomiting: no Relief with cranberry juice: no Relief with pyridium: no Status: stable Previous urinary tract infection: no Recurrent urinary tract infection: no Sexual activity: No sexually active History of sexually transmitted disease: no Vaginal discharge: no Treatments attempted: none   ANXIETY/DEPRESSION Duration: chronic Status:stable Anxious mood: yes  Excessive worrying: no Irritability: no  Sweating: no Nausea: no Palpitations:no Hyperventilation: no Panic attacks: no Agoraphobia: no  Obscessions/compulsions: no Depressed mood: yes Depression screen [facility] 2/9 08/26/2020 05/23/2020 02/28/2020 05/16/2019 05/16/2019  Decreased Interest 0 0 0 0 0  Down, Depressed, Hopeless 1 0 1 0 0  PHQ - 2 Score 1 0 1  0 0  Altered sleeping 1 0 0 0 -  Tired, decreased energy 1 0 0 0 -  Change in appetite 0 0 0 0 -  Feeling bad or failure about yourself  0 0 0 0 -  Trouble concentrating 0 0 1 1 -   Moving slowly or fidgety/restless 0 0 0 0 -  Suicidal thoughts 0 0 0 0 -  PHQ-9 Score 3 0 2 1 -  Difficult doing work/chores Not difficult at all - Not difficult at all Not difficult at all -  Some recent data might be hidden   GAD 7 : Generalized Anxiety Score 08/26/2020 02/28/2020 02/08/2019 08/04/2018  Nervous, Anxious, on Edge 1 1 1 1   Control/stop worrying 0 0 0 0  Worry too much - different things 0 0 0 0  Trouble relaxing 0 0 0 0  Restless 0 0 0 0  Easily annoyed or irritable 1 0 0 1  Afraid - awful might happen 0 0 0 0  Total GAD 7 Score 2 1 1 2   Anxiety Difficulty Not difficult at all Not difficult at all Not difficult at all Not difficult at all   Anhedonia: no Weight changes: no Insomnia: no   Hypersomnia: no Fatigue/loss of energy: yes Feelings of worthlessness: no Feelings of guilt: no Impaired concentration/indecisiveness: no Suicidal ideations: no  Crying spells: no Recent Stressors/Life Changes: yes   Relationship problems: no   Family stress: no     Financial stress: no    Job stress: no    Recent death/loss: yes   Relevant past medical, surgical, family and social history reviewed and updated as indicated. Interim medical history since our last visit reviewed. Allergies and medications reviewed and updated.  Review of Systems  Constitutional: Positive for fatigue. Negative for activity change, appetite change, chills, diaphoresis, fever and unexpected weight change.  Respiratory: Negative.   Cardiovascular: Negative.   Gastrointestinal: Negative.   Musculoskeletal: Negative.   Skin: Negative.   Psychiatric/Behavioral: Positive for dysphoric mood and sleep disturbance. Negative for agitation, behavioral problems, confusion, decreased concentration, hallucinations, self-injury and suicidal ideas. The patient is nervous/anxious. The patient is not hyperactive.     Per HPI unless specifically indicated above     Objective:    Wt 199 lb 3.2 oz (90.4 kg)    SpO2 97%   BMI 36.43 kg/m   Wt Readings from Last 3 Encounters:  08/26/20 199 lb 3.2 oz (90.4 kg)  06/30/20 201 lb 12.8 oz (91.5 kg)  02/28/20 203 lb (92.1 kg)    Physical Exam Vitals and nursing note reviewed.  Constitutional:      General: She is not in acute distress.    Appearance: Normal appearance. She is not ill-appearing, toxic-appearing or diaphoretic.  HENT:     Head: Normocephalic and atraumatic.     Right Ear: External ear normal.     Left Ear: External ear normal.     Nose: Nose normal.     Mouth/Throat:     Mouth: Mucous membranes are moist.     Pharynx: Oropharynx is clear.  Eyes:     General: No scleral icterus.       Right eye: No discharge.        Left eye: No discharge.     Extraocular Movements: Extraocular movements intact.     Left eye: Nystagmus present.     Conjunctiva/sclera: Conjunctivae normal.     Pupils: Pupils are equal, round, and reactive to light.  Cardiovascular:  Rate and Rhythm: Normal rate and regular rhythm.     Pulses: Normal pulses.     Heart sounds: Normal heart sounds. No murmur heard. No friction rub. No gallop.   Pulmonary:     Effort: Pulmonary effort is normal. No respiratory distress.     Breath sounds: Normal breath sounds. No stridor. No wheezing, rhonchi or rales.  Chest:     Chest wall: No tenderness.  Musculoskeletal:        General: Normal range of motion.     Cervical back: Normal range of motion and neck supple.  Skin:    General: Skin is warm and dry.     Capillary Refill: Capillary refill takes less than 2 seconds.     Coloration: Skin is not jaundiced or pale.     Findings: No bruising, erythema, lesion or rash.  Neurological:     General: No focal deficit present.     Mental Status: She is alert and oriented to person, place, and time. Mental status is at baseline.  Psychiatric:        Mood and Affect: Mood normal.        Behavior: Behavior normal.        Thought Content: Thought content normal.         Judgment: Judgment normal.     Results for orders placed or performed in visit on 08/26/20  Bayer DCA Hb A1c Waived  Result Value Ref Range   HB A1C (BAYER DCA - WAIVED) 5.7 <7.0 %  Urinalysis, Routine w reflex microscopic  Result Value Ref Range   Specific Gravity, UA >1.030 (H) 1.005 - 1.030   pH, UA 5.5 5.0 - 7.5   Color, UA Yellow Yellow   Appearance Ur Clear Clear   Leukocytes,UA Negative Negative   Protein,UA Negative Negative/Trace   Glucose, UA Negative Negative   Ketones, UA Negative Negative   RBC, UA Negative Negative   Bilirubin, UA Negative Negative   Urobilinogen, Ur 0.2 0.2 - 1.0 mg/dL   Nitrite, UA Negative Negative      Assessment & Plan:   Problem List Items Addressed This Visit      Digestive   Fatty liver    Rechecking labs today. Await results.       Relevant Orders   Comprehensive metabolic panel     Endocrine   IFG (impaired fasting glucose)    Doing well with A1c of 5.7 continue current regimen. Continue to monitor. Call with any concerns.       Relevant Orders   Bayer DCA Hb A1c Waived (Completed)   Comprehensive metabolic panel     Genitourinary   Benign hypertensive renal disease    Under good control on current regimen. Continue current regimen. Continue to monitor. Call with any concerns. Refills given. Labs drawn today.       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 Await results. Treat as needed.       Relevant Orders   Comprehensive metabolic panel     Other   Vitamin D deficiency disease    Rechecking labs today. Await results.       Relevant Orders   Comprehensive metabolic panel   VITAMIN D 25 Hydroxy (Vit-D Deficiency, Fractures)   Vitamin B12 deficiency    Rechecking labs today. Await results.       Relevant Orders   Comprehensive metabolic  panel   B12   Depression    Under good control on current  regimen. Continue current regimen. Continue to monitor. Call with any concerns. Refills given.        Anxiety    Under good control on current regimen. Continue current regimen. Continue to monitor. Call with any concerns. Refills given.        Dyslipidemia    Under good control on current regimen. Continue current regimen. Continue to monitor. Call with any concerns. Refills given.        Relevant Orders   Comprehensive metabolic panel   Lipid Panel w/o Chol/HDL Ratio    Other Visit Diagnoses    Benign paroxysmal positional vertigo of left ear    -  Primary   Will treat with epley's manuver and check labs. Call with any concerns or if not improving.    Foul smelling urine       UA clear.    Relevant Orders   Urinalysis, Routine w reflex microscopic (Completed)       Follow up plan: Return in about 6 months (around 02/26/2021).

## 2020-08-26 NOTE — Assessment & Plan Note (Signed)
Under good control on current regimen. Continue current regimen. Continue to monitor. Call with any concerns. Refills given.   

## 2020-08-26 NOTE — Assessment & Plan Note (Signed)
Rechecking labs today. Await results.  

## 2020-08-26 NOTE — Assessment & Plan Note (Signed)
Under good control on current regimen. Continue current regimen. Continue to monitor. Call with any concerns. Refills given. Labs drawn today.   

## 2020-08-27 ENCOUNTER — Encounter: Payer: Self-pay | Admitting: Family Medicine

## 2020-08-27 ENCOUNTER — Ambulatory Visit: Payer: Self-pay | Admitting: Family Medicine

## 2020-08-27 LAB — COMPREHENSIVE METABOLIC PANEL
ALT: 24 IU/L (ref 0–32)
AST: 21 IU/L (ref 0–40)
Albumin/Globulin Ratio: 1.9 (ref 1.2–2.2)
Albumin: 4.3 g/dL (ref 3.8–4.8)
Alkaline Phosphatase: 73 IU/L (ref 44–121)
BUN/Creatinine Ratio: 26 (ref 12–28)
BUN: 15 mg/dL (ref 8–27)
Bilirubin Total: 0.3 mg/dL (ref 0.0–1.2)
CO2: 22 mmol/L (ref 20–29)
Calcium: 9.8 mg/dL (ref 8.7–10.3)
Chloride: 102 mmol/L (ref 96–106)
Creatinine, Ser: 0.58 mg/dL (ref 0.57–1.00)
Globulin, Total: 2.3 g/dL (ref 1.5–4.5)
Glucose: 91 mg/dL (ref 65–99)
Potassium: 5.3 mmol/L — ABNORMAL HIGH (ref 3.5–5.2)
Sodium: 139 mmol/L (ref 134–144)
Total Protein: 6.6 g/dL (ref 6.0–8.5)
eGFR: 102 mL/min/{1.73_m2} (ref >59)

## 2020-08-27 LAB — LIPID PANEL W/O CHOL/HDL RATIO
Cholesterol, Total: 202 mg/dL — ABNORMAL HIGH (ref 100–199)
HDL: 59 mg/dL (ref >39)
LDL Chol Calc (NIH): 119 mg/dL — ABNORMAL HIGH (ref 0–99)
Triglycerides: 139 mg/dL (ref 0–149)
VLDL Cholesterol Cal: 24 mg/dL (ref 5–40)

## 2020-08-27 LAB — VITAMIN B12: Vitamin B-12: 534 pg/mL (ref 232–1245)

## 2020-08-27 LAB — VITAMIN D 25 HYDROXY (VIT D DEFICIENCY, FRACTURES): Vit D, 25-Hydroxy: 46.5 ng/mL (ref 30.0–100.0)

## 2020-08-28 ENCOUNTER — Ambulatory Visit: Payer: Self-pay | Admitting: Family Medicine

## 2020-08-28 ENCOUNTER — Telehealth: Payer: Self-pay

## 2020-08-28 NOTE — Telephone Encounter (Signed)
Patient aware of provider advice.

## 2020-08-28 NOTE — Telephone Encounter (Signed)
Copied from [place] [id number]. Topic: General - Other >> Aug 27, 2020  4:57 PM Yvette Rack wrote: Reason for CRM: Pt called for lab results. Pt also stated she would like to ask Dr. Wynetta Emery about medication named Meclizine 25 mg. Pt requests call back

## 2020-08-28 NOTE — Telephone Encounter (Signed)
Yes, she can take this with no issues

## 2020-08-28 NOTE — Telephone Encounter (Signed)
Patient aware of results. Patient would like to know can she take OTC meclizine, she heard from a friend they take it for vertigo and it helps. Patient would like to know can she take it and would it interact with any of her current medications?

## 2020-11-25 ENCOUNTER — Telehealth: Payer: Self-pay

## 2020-11-25 MED ORDER — FLUCONAZOLE 150 MG PO TABS: 150.0000 mg | ORAL_TABLET | Freq: Every day | ORAL | 0 refills | Status: DC

## 2020-11-25 NOTE — Telephone Encounter (Signed)
Copied from [place] [phone]. Topic: General - Inquiry >> Nov 25, 2020  1:45 PM Greggory Keen D wrote: Reason for CRM: Pt called saying on July 15 she went to the Urgent care at [facility]  for a sore throat and sinus infection, ear pain.  They gave her 2 rounds of antibiotics.  Now she has a yeast infection and wants to know if Dr. Wynetta Emery can send her something to the pharmacy for that  [facility]  CB#  [phone]   Routing to provider to advise. UC notes in chart from [facility].

## 2020-12-12 ENCOUNTER — Telehealth: Payer: Self-pay | Admitting: Family Medicine

## 2020-12-12 MED ORDER — FLUCONAZOLE 150 MG PO TABS: 150.0000 mg | ORAL_TABLET | Freq: Every day | ORAL | 0 refills | Status: DC

## 2020-12-12 NOTE — Telephone Encounter (Signed)
Routing to provider to advise.  

## 2020-12-12 NOTE — Telephone Encounter (Signed)
Pt is finished with abx and the one diflucan pill she received on 11-24-2020 did not completely solve yeast infection and pt would like to know if dr Wynetta Emery could call in 2 or 1  diflucan pill to [place] phone number [phone]

## 2020-12-24 ENCOUNTER — Ambulatory Visit: Payer: Self-pay | Admitting: Nurse Practitioner

## 2021-01-19 ENCOUNTER — Other Ambulatory Visit: Payer: Self-pay | Admitting: Family Medicine

## 2021-01-19 DIAGNOSIS — F332 Major depressive disorder, recurrent severe without psychotic features: Secondary | ICD-10-CM

## 2021-01-19 NOTE — Telephone Encounter (Signed)
Requested medication (s) are due for refill today:yes  Requested medication (s) are on the active medication list: yes  Last refill: 05/05/20 #270  1 refill  Future visit scheduled yes  02/27/21  Notes to clinic:not delegated  Requested Prescriptions  Pending Prescriptions Disp Refills   QUEtiapine (SEROQUEL) 25 MG tablet [Pharmacy Med Name: quetiapine 25 mg tablet] 270 tablet 1    Sig: TAKE 1-2 TABLETS DAILY AT BEDTIME. MAY TAKE ADDITIONAL 1/2 TABLET UP TO 3 TIMES DAILY AS NEEDED FOR ANXIETY     Not Delegated - Psychiatry:  Antipsychotics - Second Generation (Atypical) - quetiapine Failed - 01/19/2021  9:10 AM      Failed - This refill cannot be delegated      Passed - ALT in normal range and within 180 days    ALT  Date Value Ref Range Status  08/26/2020 24 0 - 32 IU/L Final          Passed - AST in normal range and within 180 days    AST  Date Value Ref Range Status  08/26/2020 21 0 - 40 IU/L Final          Passed - Completed PHQ-2 or PHQ-9 in the last 360 days      Passed - Last BP in normal range    BP Readings from Last 1 Encounters:  06/30/20 135/85          Passed - Valid encounter within last 6 months    Recent Outpatient Visits           4 months ago Benign paroxysmal positional vertigo of left ear   [place], Megan P, DO   6 months ago Achilles tendinitis of right lower extremity   [place], Megan P, DO   8 months ago Acute non-recurrent frontal sinusitis   [place], [place] T, NP   8 months ago Suspected COVID-19 virus infection   [employer], Megan P, DO   10 months ago IFG (impaired fasting glucose)   [facility] [place], [place], DO       Future Appointments             In 1 month Johnson, Barb Merino, DO [employer], [facility]

## 2021-01-30 ENCOUNTER — Ambulatory Visit: Payer: Self-pay

## 2021-01-30 NOTE — Telephone Encounter (Signed)
Called to  speak with patient about her phone call she made to the clinic. LVM for patient to return call.

## 2021-01-30 NOTE — Telephone Encounter (Signed)
Patient says has itching and burning going on, thinks its yeast infection. She has asking ofr 2 pink pills of antibiotis to use to [place], [place]  Phone: [phone]  Fax: [phone]   Pt. Was on an antibiotic a few weeks ago when she had COVID 19. Started started having vaginal burning and itching 2 weeks ago. Asking for Diflucan to be sent to her pharmacy. Please advise pt.   Answer Assessment - Initial Assessment Questions 1. SYMPTOM: "What's the main symptom you're concerned about?" (e.g., pain, itching, dryness)     Vaginal burning and itching 2. LOCATION: "Where is the  Symptoms located?" (e.g., inside/outside, left/right)     Outside 3. ONSET: "When did the  symptoms  start?"     2 weeks ago 4. PAIN: "Is there any pain?" If Yes, ask: "How bad is it?" (Scale: 1-10; mild, moderate, severe)     No 5. ITCHING: "Is there any itching?" If Yes, ask: "How bad is it?" (Scale: 1-10; mild, moderate, severe)     Moderate and burning 6. CAUSE: "What do you think is causing the discharge?" "Have you had the same problem before? What happened then?"     Yeast infection 7. OTHER SYMPTOMS: "Do you have any other symptoms?" (e.g., fever, itching, vaginal bleeding, pain with urination, injury to genital area, vaginal foreign body)     No discharge 8. PREGNANCY: "Is there any chance you are pregnant?" "When was your last menstrual period?"     No  Protocols used: Vaginal Symptoms-A-AH

## 2021-02-02 MED ORDER — FLUCONAZOLE 150 MG PO TABS: 150.0000 mg | ORAL_TABLET | Freq: Once | ORAL | 0 refills | Status: AC

## 2021-02-02 NOTE — Telephone Encounter (Signed)
Pt states that she can not afford to come in for an appt. She says that Dr. Wynetta Emery calls in the pink pills for her without having to be seen. She requests a call back if provider refuses to give the pill.

## 2021-02-02 NOTE — Addendum Note (Signed)
Addended by: Jon Billings on: 02/02/2021 01:09 PM   Modules accepted: Orders

## 2021-02-02 NOTE — Telephone Encounter (Signed)
Left message for patient informing patient that Santiago Glad has sent over a prescription for Diflucan for patient as Dr.Johnson is out of the office. Advised patient to give our office a call back if she has any questions or concerns regarding her prescription.

## 2021-02-02 NOTE — Telephone Encounter (Signed)
Patient will need to come in for an appointment and to have a swab done.  I can see her at 1 or 2 pm today.

## 2021-02-02 NOTE — Telephone Encounter (Signed)
I sent one pill of diflucan to the pharmacy for patient.  That is what Dr. Wynetta Emery sent for her in August.

## 2021-02-02 NOTE — Telephone Encounter (Signed)
Patient states she was seen in the [facility] at the hospital due to not feeling well and while she was there. She was tested and tested positive for COVID. She states that she was scheduled to see Dr.Johnson and she states she cancelled it due to her not feel well. Patient states she was prescribed an antibiotic there and states she typically gets an yeast infection after taking the medication and she was told to follow up with her PCP. Patient states she was seen in the [facility] recently. Please advise? Patient states her symptoms have been persistent every since she has completed the course of treatment. Does patient need an appointment?

## 2021-02-27 ENCOUNTER — Ambulatory Visit: Payer: Self-pay | Admitting: Family Medicine

## 2021-03-12 ENCOUNTER — Other Ambulatory Visit: Payer: Self-pay | Admitting: Family Medicine

## 2021-03-13 NOTE — Telephone Encounter (Signed)
I called pt to make an a 6 month appt for her so we could refill the Lotensin 20 mg refill request.   Due to financial hardship and not having insurance pt is not able to come in right now.   She is asking if Dr. Park Liter will fill her prescription as an exception due to her circumstances.   She has been sick with repeated sinus infections and has been to the dr. (Walk in clinic) several times and has medical bills. I sent her a 30 day courtesy supply because she said she was out of the [place].   Note sent to Dr. Wynetta Emery letting her know pt's situation.

## 2021-05-07 ENCOUNTER — Other Ambulatory Visit: Payer: Self-pay | Admitting: Family Medicine

## 2021-05-07 MED ORDER — BENAZEPRIL HCL 20 MG PO TABS: 20.0000 mg | ORAL_TABLET | Freq: Every day | ORAL | 0 refills | Status: DC

## 2021-05-07 NOTE — Telephone Encounter (Signed)
Copied from [place] [phone]. Topic: Quick Communication - Rx Refill/Question >> May 07, 2021 10:38 AM [name] A wrote: Medication: benazepril (LOTENSIN) 20 MG tablet [[id number]]  Has the patient contacted their pharmacy? Yes.  The patient has been directed to contact their PCP (Agent: If no, request that the patient contact the pharmacy for the refill. If patient does not wish to contact the pharmacy document the reason why and proceed with request.) (Agent: If yes, when and what did the pharmacy advise?)  Preferred Pharmacy (with phone number or street name): [place], [place], [place] [place] [address] Phone: [phone] Fax: [phone]   Has the patient been seen for an appointment in the last year OR does the patient have an upcoming appointment? Yes.    Agent: Please be advised that RX refills may take up to 3 business days. We ask that you follow-up with your pharmacy.

## 2021-05-07 NOTE — Telephone Encounter (Signed)
Requested medication (s) are due for refill today: yes  Requested medication (s) are on the active medication list: yes  Last refill:  03/13/21 #30/0  Future visit scheduled: yes 06/11/21 at 1000  Notes to clinic:  pt is due for updated labs. Pt called and scheduled appt but is asking if med can be refilled until appt. Please advise     Requested Prescriptions  Pending Prescriptions Disp Refills   benazepril (LOTENSIN) 20 MG tablet 30 tablet 0    Sig: Take 1 tablet (20 mg total) by mouth daily.     Cardiovascular:  ACE Inhibitors Failed - 05/07/2021 10:41 AM      Failed - Cr in normal range and within 180 days    Creatinine, Ser  Date Value Ref Range Status  08/26/2020 0.58 0.57 - 1.00 mg/dL Final          Failed - K in normal range and within 180 days    Potassium  Date Value Ref Range Status  08/26/2020 5.3 (H) 3.5 - 5.2 mmol/L Final          Failed - Valid encounter within last 6 months    Recent Outpatient Visits           8 months ago Benign paroxysmal positional vertigo of left ear   [facility] [place], Megan P, DO   10 months ago Achilles tendinitis of right lower extremity   [place], Megan P, DO   11 months ago Acute non-recurrent frontal sinusitis   [place], Henrine Screws T, NP   1 year ago Suspected COVID-19 virus infection   [facility] [place], [place], DO   1 year ago IFG (impaired fasting glucose)   [facility] [place], [place], DO       Future Appointments             In 1 month Johnson, Megan P, DO [place], [place] - Patient is not pregnant      Passed - Last BP in normal range    BP Readings from Last 1 Encounters:  06/30/20 135/85

## 2021-06-09 ENCOUNTER — Other Ambulatory Visit: Payer: Self-pay | Admitting: Family Medicine

## 2021-06-09 ENCOUNTER — Ambulatory Visit: Payer: Self-pay

## 2021-06-09 NOTE — Telephone Encounter (Signed)
°  Chief Complaint: yeast infection Symptoms: red area under abdomen folds, moist and burning, has spreaded to tops of legs Frequency: 2 weeks Pertinent Negatives: NA Disposition: [] ED /[] Urgent Care (no appt availability in office) / [x] Appointment(In office/virtual)/ []  [facility]/ [] Home Care/ [] Refused Recommended Disposition /[] [facility]/ []  Follow-up with PCP Additional Notes: Pt has used several different creams and powders and nothing is helping. Advised her to keep area clean and dry as much as possible and rescheduled appt to where pt can come in tomorrow and have just 1 appt.    Summary: personal/female issues   Pt requests call back because she has some concerns regarding personal female issues. Cb# [phone]      Reason for Disposition  [1] Rash is painful to touch AND [2] fever  Answer Assessment - Initial Assessment Questions 1. APPEARANCE of RASH: "Describe the rash."      Yeast infection 2. LOCATION: "Where is the rash located?"      Under the bottom of belly 5. ONSET: "When did the rash start?"      2 weeks 6. ITCHING: "Does the rash itch?" If Yes, ask: "How bad is the itch?"  (Scale 0-10; or none, mild, moderate, severe)     mild 7. PAIN: "Does the rash hurt?" If Yes, ask: "How bad is the pain?"  (Scale 0-10; or none, mild, moderate, severe)    - NONE (0): no pain    - MILD (1-3): doesn't interfere with normal activities     - MODERATE (4-7): interferes with normal activities or awakens from sleep     - SEVERE (8-10): excruciating pain, unable to do any normal activities     [place]  Protocols used: Rash or Redness - Localized-A-AH

## 2021-06-09 NOTE — Telephone Encounter (Signed)
Requested medications are due for refill today.  yes  Requested medications are on the active medications list.  yes  Last refill. 05/07/2021 #30 0 refills  Future visit scheduled.   yes  Notes to clinic.  Pt already given courtesy refill. Pt has appointment 06/10/2021    Requested Prescriptions  Pending Prescriptions Disp Refills   benazepril (LOTENSIN) 20 MG tablet [Pharmacy Med Name: benazepril 20 mg tablet] 30 tablet 0    Sig: TAKE ONE TABLET BY MOUTH ONCE DAILY     Cardiovascular:  ACE Inhibitors Failed - 06/09/2021 10:43 AM      Failed - Cr in normal range and within 180 days    Creatinine, Ser  Date Value Ref Range Status  08/26/2020 0.58 0.57 - 1.00 mg/dL Final          Failed - K in normal range and within 180 days    Potassium  Date Value Ref Range Status  08/26/2020 5.3 (H) 3.5 - 5.2 mmol/L Final          Failed - Valid encounter within last 6 months    Recent Outpatient Visits           9 months ago Benign paroxysmal positional vertigo of left ear   [facility] [place], Megan P, DO   11 months ago Achilles tendinitis of right lower extremity   [facility] [place], Megan P, DO   1 year ago Acute non-recurrent frontal sinusitis   [facility] [place], Henrine Screws T, NP   1 year ago Suspected COVID-19 virus infection   [facility] [place], [place], DO   1 year ago IFG (impaired fasting glucose)   [facility] [place], [place], DO       Future Appointments             Tomorrow Johnson, Megan P, DO [place], [place] - Patient is not pregnant      Passed - Last BP in normal range    BP Readings from Last 1 Encounters:  06/30/20 135/85

## 2021-06-10 ENCOUNTER — Other Ambulatory Visit: Payer: Self-pay

## 2021-06-10 ENCOUNTER — Encounter: Payer: Self-pay | Admitting: Family Medicine

## 2021-06-10 ENCOUNTER — Ambulatory Visit (INDEPENDENT_AMBULATORY_CARE_PROVIDER_SITE_OTHER): Payer: Self-pay | Admitting: Family Medicine

## 2021-06-10 VITALS — BP 117/71 | HR 63 | Temp 97.9°F | Wt 199.0 lb

## 2021-06-10 DIAGNOSIS — E785 Hyperlipidemia, unspecified: Secondary | ICD-10-CM

## 2021-06-10 DIAGNOSIS — B372 Candidiasis of skin and nail: Secondary | ICD-10-CM

## 2021-06-10 DIAGNOSIS — K760 Fatty (change of) liver, not elsewhere classified: Secondary | ICD-10-CM

## 2021-06-10 DIAGNOSIS — R7301 Impaired fasting glucose: Secondary | ICD-10-CM

## 2021-06-10 DIAGNOSIS — I129 Hypertensive chronic kidney disease with stage 1 through stage 4 chronic kidney disease, or unspecified chronic kidney disease: Secondary | ICD-10-CM

## 2021-06-10 DIAGNOSIS — F332 Major depressive disorder, recurrent severe without psychotic features: Secondary | ICD-10-CM

## 2021-06-10 LAB — BAYER DCA HB A1C WAIVED: HB A1C (BAYER DCA - WAIVED): 5.6 % (ref 4.8–5.6)

## 2021-06-10 LAB — MICROALBUMIN, URINE WAIVED
Creatinine, Urine Waived: 100 mg/dL (ref 10–300)
Microalb, Ur Waived: 10 mg/L (ref 0–19)
Microalb/Creat Ratio: <30 mg/g (ref <30)

## 2021-06-10 MED ORDER — NYSTATIN 100000 UNIT/GM EX POWD: 1.0000 "application " | Freq: Three times a day (TID) | CUTANEOUS | 3 refills | Status: DC

## 2021-06-10 MED ORDER — QUETIAPINE FUMARATE 25 MG PO TABS: ORAL_TABLET | ORAL | 1 refills | Status: DC

## 2021-06-10 MED ORDER — ALBUTEROL SULFATE HFA 108 (90 BASE) MCG/ACT IN AERS: 2.0000 | INHALATION_SPRAY | RESPIRATORY_TRACT | 3 refills | Status: DC | PRN

## 2021-06-10 MED ORDER — ETODOLAC 400 MG PO TABS: 400.0000 mg | ORAL_TABLET | Freq: Two times a day (BID) | ORAL | 1 refills | Status: DC | PRN

## 2021-06-10 MED ORDER — ALBUTEROL SULFATE (2.5 MG/3ML) 0.083% IN NEBU: 2.5000 mg | INHALATION_SOLUTION | Freq: Four times a day (QID) | RESPIRATORY_TRACT | 1 refills | Status: DC | PRN

## 2021-06-10 MED ORDER — BENAZEPRIL HCL 20 MG PO TABS: 20.0000 mg | ORAL_TABLET | Freq: Every day | ORAL | 1 refills | Status: DC

## 2021-06-10 NOTE — Telephone Encounter (Signed)
Patient was seen in office 2/15.  Nothing further needed.

## 2021-06-10 NOTE — Assessment & Plan Note (Signed)
Stable with A1c of 5.6. Continue diet and exercise. Call with any concerns. Continue to monitor.  

## 2021-06-10 NOTE — Assessment & Plan Note (Signed)
Under good control on current regimen. Continue current regimen. Continue to monitor. Call with any concerns. Refills given. Labs drawn today.   

## 2021-06-10 NOTE — Progress Notes (Signed)
BP 117/71    Pulse 63    Temp 97.9 F (36.6 C)    Wt 199 lb (90.3 kg)    SpO2 98%    BMI 36.40 kg/m    Subjective:    Patient ID: Jaime Freeman, female    DOB: [birth year], 66 y.o.   MRN: [id number]  HPI: Jaime Freeman is a 66 y.o. female  Chief Complaint  Patient presents with   Hypertension   Chronic Kidney Disease   IFG   Depression   Anxiety   Rash    Patient states she has a rash on her lower abdomen area. Area is red, bumpy, and painful.   HYPERTENSION / HYPERLIPIDEMIA Satisfied with current treatment? yes Duration of hypertension: chronic BP monitoring frequency: not checking BP medication side effects: no Past BP meds: benazepril Duration of hyperlipidemia: chronic Cholesterol medication side effects: no Cholesterol supplements: none Past cholesterol medications: none Medication compliance: excellent compliance Aspirin: no Recent stressors: no Recurrent headaches: no Visual changes: no Palpitations: no Dyspnea: no Chest pain: no Lower extremity edema: no Dizzy/lightheaded: no  Impaired Fasting Glucose HbA1C:  Lab Results  Component Value Date   HGBA1C 5.6 06/10/2021   Duration of elevated blood sugar: chronic Polydipsia: no Polyuria: no Weight change: no Visual disturbance: no Glucose Monitoring: no Diabetic Education: Not Completed Family history of diabetes: yes  DEPRESSION Mood status: stable Satisfied with current treatment?: yes Symptom severity: moderate  Duration of current treatment : chronic Side effects: no Medication compliance: excellent compliance Psychotherapy/counseling: no  Previous psychiatric medications: seroquel Depressed mood: yes Anxious mood: yes Anhedonia: no Significant weight loss or gain: no Insomnia: no  Fatigue: yes Feelings of worthlessness or guilt: no Impaired concentration/indecisiveness: no Suicidal ideations: no Hopelessness: no Crying spells: yes Depression screen [facility] 2/9 08/26/2020 05/23/2020  02/28/2020 05/16/2019 05/16/2019  Decreased Interest 0 0 0 0 0  Down, Depressed, Hopeless 1 0 1 0 0  PHQ - 2 Score 1 0 1 0 0  Altered sleeping 1 0 0 0 -  Tired, decreased energy 1 0 0 0 -  Change in appetite 0 0 0 0 -  Feeling bad or failure about yourself  0 0 0 0 -  Trouble concentrating 0 0 1 1 -  Moving slowly or fidgety/restless 0 0 0 0 -  Suicidal thoughts 0 0 0 0 -  PHQ-9 Score 3 0 2 1 -  Difficult doing work/chores Not difficult at all - Not difficult at all Not difficult at all -  Some recent data might be hidden    Relevant past medical, surgical, family and social history reviewed and updated as indicated. Interim medical history since our last visit reviewed. Allergies and medications reviewed and updated.  Review of Systems  Constitutional: Negative.   Respiratory: Negative.    Cardiovascular: Negative.   Gastrointestinal: Negative.   Musculoskeletal: Negative.   Skin:  Positive for rash. Negative for color change, pallor and wound.  Neurological: Negative.   Psychiatric/Behavioral: Negative.     Per HPI unless specifically indicated above     Objective:    BP 117/71    Pulse 63    Temp 97.9 F (36.6 C)    Wt 199 lb (90.3 kg)    SpO2 98%    BMI 36.40 kg/m   Wt Readings from Last 3 Encounters:  06/10/21 199 lb (90.3 kg)  08/26/20 199 lb 3.2 oz (90.4 kg)  06/30/20 201 lb 12.8 oz (91.5 kg)  Physical Exam Vitals and nursing note reviewed.  Constitutional:      General: She is not in acute distress.    Appearance: Normal appearance. She is not ill-appearing, toxic-appearing or diaphoretic.  HENT:     Head: Normocephalic and atraumatic.     Right Ear: External ear normal.     Left Ear: External ear normal.     Nose: Nose normal.     Mouth/Throat:     Mouth: Mucous membranes are moist.     Pharynx: Oropharynx is clear.  Eyes:     General: No scleral icterus.       Right eye: No discharge.        Left eye: No discharge.     Extraocular Movements:  Extraocular movements intact.     Conjunctiva/sclera: Conjunctivae normal.     Pupils: Pupils are equal, round, and reactive to light.  Cardiovascular:     Rate and Rhythm: Normal rate and regular rhythm.     Pulses: Normal pulses.     Heart sounds: Normal heart sounds. No murmur heard.   No friction rub. No gallop.  Pulmonary:     Effort: Pulmonary effort is normal. No respiratory distress.     Breath sounds: Normal breath sounds. No stridor. No wheezing, rhonchi or rales.  Chest:     Chest wall: No tenderness.  Musculoskeletal:        General: Normal range of motion.     Cervical back: Normal range of motion and neck supple.  Skin:    General: Skin is warm and dry.     Capillary Refill: Capillary refill takes less than 2 seconds.     Coloration: Skin is not jaundiced or pale.     Findings: Rash (red, irritated rash under pannus) present. No bruising, erythema or lesion.  Neurological:     General: No focal deficit present.     Mental Status: She is alert and oriented to person, place, and time. Mental status is at baseline.  Psychiatric:        Mood and Affect: Mood normal.        Behavior: Behavior normal.        Thought Content: Thought content normal.        Judgment: Judgment normal.    Results for orders placed or performed in visit on 06/10/21  Bayer DCA Hb A1c Waived  Result Value Ref Range   HB A1C (BAYER DCA - WAIVED) 5.6 4.8 - 5.6 %  Microalbumin, Urine Waived  Result Value Ref Range   Microalb, Ur Waived 10 0 - 19 mg/L   Creatinine, Urine Waived 100 10 - 300 mg/dL   Microalb/Creat Ratio <30 <30 mg/g      Assessment & Plan:   Problem List Items Addressed This Visit       Digestive   Fatty liver    Rechecking labs. Await results. Call with any concerns.       Relevant Orders   Comprehensive metabolic panel     Endocrine   IFG (impaired fasting glucose) - Primary    Stable with A1c of 5.6. Continue diet and exercise. Call with any concerns. Continue  to monitor.       Relevant Orders   Bayer DCA Hb A1c Waived (Completed)   Comprehensive metabolic panel     Genitourinary   Benign hypertensive renal disease    Under good control on current regimen. Continue current regimen. Continue to monitor. Call with any concerns. Refills given. Labs  drawn today.       Relevant Orders   Comprehensive metabolic panel   Microalbumin, Urine Waived (Completed)     Other   Depression    Under good control on current regimen. Continue current regimen. Continue to monitor. Call with any concerns. Refills given. Labs drawn today.       Relevant Medications   QUEtiapine (SEROQUEL) 25 MG tablet   Dyslipidemia    Under good control on current regimen. Continue current regimen. Continue to monitor. Call with any concerns. Refills given. Labs drawn today.       Relevant Orders   Comprehensive metabolic panel   Lipid Panel w/o Chol/HDL Ratio   Other Visit Diagnoses     Candidal intertrigo       Will treat with nystatin. Call with any concerns.    Relevant Medications   nystatin (MYCOSTATIN/NYSTOP) powder        Follow up plan: Return in about 6 months (around 12/08/2021) for Welcome to medicare.

## 2021-06-10 NOTE — Assessment & Plan Note (Signed)
Rechecking labs. Await results. Call with any concerns.

## 2021-06-11 ENCOUNTER — Ambulatory Visit: Payer: Self-pay | Admitting: Family Medicine

## 2021-06-11 ENCOUNTER — Encounter: Payer: Self-pay | Admitting: Family Medicine

## 2021-06-11 LAB — LIPID PANEL W/O CHOL/HDL RATIO
Cholesterol, Total: 186 mg/dL (ref 100–199)
HDL: 63 mg/dL (ref >39)
LDL Chol Calc (NIH): 108 mg/dL — ABNORMAL HIGH (ref 0–99)
Triglycerides: 81 mg/dL (ref 0–149)
VLDL Cholesterol Cal: 15 mg/dL (ref 5–40)

## 2021-06-11 LAB — COMPREHENSIVE METABOLIC PANEL
ALT: 12 IU/L (ref 0–32)
AST: 19 IU/L (ref 0–40)
Albumin/Globulin Ratio: 2.1 (ref 1.2–2.2)
Albumin: 4.4 g/dL (ref 3.8–4.8)
Alkaline Phosphatase: 75 IU/L (ref 44–121)
BUN/Creatinine Ratio: 24 (ref 12–28)
BUN: 15 mg/dL (ref 8–27)
Bilirubin Total: 0.4 mg/dL (ref 0.0–1.2)
CO2: 25 mmol/L (ref 20–29)
Calcium: 9.8 mg/dL (ref 8.7–10.3)
Chloride: 105 mmol/L (ref 96–106)
Creatinine, Ser: 0.63 mg/dL (ref 0.57–1.00)
Globulin, Total: 2.1 g/dL (ref 1.5–4.5)
Glucose: 97 mg/dL (ref 70–99)
Potassium: 5.2 mmol/L (ref 3.5–5.2)
Sodium: 141 mmol/L (ref 134–144)
Total Protein: 6.5 g/dL (ref 6.0–8.5)
eGFR: 99 mL/min/{1.73_m2} (ref >59)

## 2021-06-22 ENCOUNTER — Other Ambulatory Visit: Payer: Self-pay | Admitting: Family Medicine

## 2021-06-22 DIAGNOSIS — F332 Major depressive disorder, recurrent severe without psychotic features: Secondary | ICD-10-CM

## 2021-06-22 NOTE — Telephone Encounter (Signed)
Pt called and is requesting for all of her Rxs to go to [facility]) pt wants a call back from the clinic once this is transferred, she wants to go pick up her Rx for [place]. She says this can be filled for 30 days.  etodolac (LODINE) 400 MG tablet   [id number] [address], [address] AT [address]  [place] [address]  Phone: [phone] Fax: [phone]

## 2021-06-22 NOTE — Telephone Encounter (Signed)
Patient called and asked which medications she wants sent to [facility]. She verified the one's to be sent due to the closing of [facility]. She asked if we could let the pharmacy know not to refill because she has enough medications, I advised she will need to call them in about 2-3 days to let them know which she needs, she verbalized understanding.

## 2021-06-23 ENCOUNTER — Telehealth: Payer: Self-pay | Admitting: *Deleted

## 2021-06-23 MED ORDER — QUETIAPINE FUMARATE 25 MG PO TABS: ORAL_TABLET | ORAL | 1 refills | Status: DC

## 2021-06-23 MED ORDER — ALBUTEROL SULFATE HFA 108 (90 BASE) MCG/ACT IN AERS
2.0000 | INHALATION_SPRAY | RESPIRATORY_TRACT | 3 refills | Status: DC | PRN
Start: 2021-06-23 — End: 2022-08-23

## 2021-06-23 MED ORDER — BENAZEPRIL HCL 20 MG PO TABS: 20.0000 mg | ORAL_TABLET | Freq: Every day | ORAL | 1 refills | Status: DC

## 2021-06-23 MED ORDER — OMEPRAZOLE 20 MG PO CPDR: 20.0000 mg | DELAYED_RELEASE_CAPSULE | Freq: Every day | ORAL | 1 refills | Status: DC

## 2021-06-23 MED ORDER — ALBUTEROL SULFATE (2.5 MG/3ML) 0.083% IN NEBU: 2.5000 mg | INHALATION_SOLUTION | Freq: Four times a day (QID) | RESPIRATORY_TRACT | 1 refills | Status: DC | PRN

## 2021-06-23 MED ORDER — NYSTATIN 100000 UNIT/GM EX POWD: 1.0000 "application " | Freq: Three times a day (TID) | CUTANEOUS | 3 refills | Status: DC

## 2021-06-23 MED ORDER — ETODOLAC 400 MG PO TABS: 400.0000 mg | ORAL_TABLET | Freq: Two times a day (BID) | ORAL | 1 refills | Status: DC | PRN

## 2021-06-23 NOTE — Telephone Encounter (Signed)
Appt scheduled 3/2

## 2021-06-23 NOTE — Telephone Encounter (Signed)
Requested medication (s) are due for refill today - yes  Requested medication (s) are on the active medication list -yes  Future visit scheduled -yes  Last refill: omeprazole- 05/29/20 #90 1 RF                 Clonazepam- 02/28/20 #20                 Quetiapine- filled last week- sent to another pharmacy- not sure will transfer- non delegated Rx   Notes to clinic: Patient has changed pharmacy- medications that were filled previously have been forwarded. See above list for provider review . [place]  Requested Prescriptions  Pending Prescriptions Disp Refills   clonazePAM (KLONOPIN) 0.5 MG tablet 20 tablet 0    Sig: Take 1 tablet (0.5 mg total) by mouth 2 (two) times daily as needed for anxiety.     Not Delegated - Psychiatry: Anxiolytics/Hypnotics 2 Failed - 06/22/2021  2:56 PM      Failed - This refill cannot be delegated      Failed - Urine Drug Screen completed in last 360 days      Passed - Patient is not pregnant      Passed - Valid encounter within last 6 months    Recent Outpatient Visits           1 week ago IFG (impaired fasting glucose)   [facility], Megan P, DO   10 months ago Benign paroxysmal positional vertigo of left ear   [facility] [place], Megan P, DO   11 months ago Achilles tendinitis of right lower extremity   [place], Megan P, DO   1 year ago Acute non-recurrent frontal sinusitis   [place], Isabella T, NP   1 year ago Suspected COVID-19 virus infection   [employer], [place], DO       Future Appointments             In 5 months Johnson, Megan P, DO [place], [facility]             omeprazole (PRILOSEC) 20 MG capsule 90 capsule 1    Sig: Take 1 capsule (20 mg total) by mouth daily.     Gastroenterology: Proton Pump Inhibitors Passed - 06/22/2021  2:56 PM      Passed - Valid encounter within last 12 months     Recent Outpatient Visits           1 week ago IFG (impaired fasting glucose)   [facility] [place], Megan P, DO   10 months ago Benign paroxysmal positional vertigo of left ear   [facility] [place], Megan P, DO   11 months ago Achilles tendinitis of right lower extremity   [place], Megan P, DO   1 year ago Acute non-recurrent frontal sinusitis   [place], Henrine Screws T, NP   1 year ago Suspected COVID-19 virus infection   [facility] [place], [place], DO       Future Appointments             In 5 months Johnson, Megan P, DO [place], [facility]             QUEtiapine (SEROQUEL) 25 MG tablet 270 tablet 1    Sig: TAKE 1-2 TABLETS DAILY AT BEDTIME. MAY TAKE ADDITIONAL 1/2 TABLET UP TO 3 TIMES DAILY AS NEEDED  FOR ANXIETY     Not Delegated - Psychiatry:  Antipsychotics - Second Generation (Atypical) - quetiapine Failed - 06/22/2021  2:56 PM      Failed - This refill cannot be delegated      Failed - TSH in normal range and within 360 days    TSH  Date Value Ref Range Status  02/08/2019 0.470 0.450 - 4.500 uIU/mL Final          Failed - Lipid Panel in normal range within the last 12 months    Cholesterol, Total  Date Value Ref Range Status  06/10/2021 186 100 - 199 mg/dL Final   Cholesterol Piccolo, Waived  Date Value Ref Range Status  10/13/2015 214 (H) <200 mg/dL Final    Comment:                            Desirable                <200                         Borderline High      200- 239                         High                     >239    LDL Chol Calc (NIH)  Date Value Ref Range Status  06/10/2021 108 (H) 0 - 99 mg/dL Final   HDL  Date Value Ref Range Status  06/10/2021 63 >39 mg/dL Final   Triglycerides  Date Value Ref Range Status  06/10/2021 81 0 - 149 mg/dL Final   Triglycerides Piccolo,Waived  Date Value Ref Range Status  10/13/2015 154 (H) <150  mg/dL Final    Comment:                            Normal                   <150                         Borderline High     150 - 199                         High                200 - 499                         Very High                >499          Failed - CBC within normal limits and completed in the last 12 months    WBC  Date Value Ref Range Status  02/08/2019 9.0 3.4 - 10.8 x10E3/uL Final  12/07/2017 9.8 3.6 - 11.0 K/uL Final   RBC  Date Value Ref Range Status  02/08/2019 3.96 3.77 - 5.28 x10E6/uL Final  12/07/2017 3.90 3.80 - 5.20 MIL/uL Final   Hemoglobin  Date Value Ref Range Status  02/08/2019 12.8 11.1 - 15.9 g/dL Final   Hematocrit  Date Value Ref Range Status  02/08/2019 36.2 34.0 - 46.6 %  Final   MCHC  Date Value Ref Range Status  02/08/2019 35.4 31.5 - 35.7 g/dL Final  12/07/2017 33.9 32.0 - 36.0 g/dL Final   [facility]  Date Value Ref Range Status  02/08/2019 32.3 26.6 - 33.0 pg Final  12/07/2017 32.4 26.0 - 34.0 pg Final   MCV  Date Value Ref Range Status  02/08/2019 91 79 - 97 fL Final   No results found for: PLTCOUNTKUC, LABPLAT, POCPLA RDW  Date Value Ref Range Status  02/08/2019 11.6 (L) 11.7 - 15.4 % Final         Passed - Completed PHQ-2 or PHQ-9 in the last 360 days      Passed - Last BP in normal range    BP Readings from Last 1 Encounters:  06/10/21 117/71          Passed - Last Heart Rate in normal range    Pulse Readings from Last 1 Encounters:  06/10/21 63          Passed - Valid encounter within last 6 months    Recent Outpatient Visits           1 week ago IFG (impaired fasting glucose)   [facility], Megan P, DO   10 months ago Benign paroxysmal positional vertigo of left ear   [facility] [place], Megan P, DO   11 months ago Achilles tendinitis of right lower extremity   [facility] [place], Megan P, DO   1 year ago Acute non-recurrent frontal sinusitis   [facility] [place], Henrine Screws T, NP   1 year ago Suspected COVID-19 virus infection   [facility] [place], [place], DO       Future Appointments             In 5 months Johnson, Megan P, DO [facility], [facility]            Passed - CMP within normal limits and completed in the last 12 months    Albumin  Date Value Ref Range Status  06/10/2021 4.4 3.8 - 4.8 g/dL Final   Alkaline Phosphatase  Date Value Ref Range Status  06/10/2021 75 44 - 121 IU/L Final   ALT  Date Value Ref Range Status  06/10/2021 12 0 - 32 IU/L Final   AST  Date Value Ref Range Status  06/10/2021 19 0 - 40 IU/L Final   BUN  Date Value Ref Range Status  06/10/2021 15 8 - 27 mg/dL Final   Calcium  Date Value Ref Range Status  06/10/2021 9.8 8.7 - 10.3 mg/dL Final   CO2  Date Value Ref Range Status  06/10/2021 25 20 - 29 mmol/L Final   Creatinine, Ser  Date Value Ref Range Status  06/10/2021 0.63 0.57 - 1.00 mg/dL Final   Glucose  Date Value Ref Range Status  06/10/2021 97 70 - 99 mg/dL Final   Glucose, Bld  Date Value Ref Range Status  12/07/2017 105 (H) 70 - 99 mg/dL Final   Glucose-Capillary  Date Value Ref Range Status  10/18/2017 102 (H) 70 - 99 mg/dL Final   Potassium  Date Value Ref Range Status  06/10/2021 5.2 3.5 - 5.2 mmol/L Final   Sodium  Date Value Ref Range Status  06/10/2021 141 134 - 144 mmol/L Final   Bilirubin Total  Date Value Ref Range Status  06/10/2021 0.4 0.0 - 1.2 mg/dL Final   Protein, ur  Date Value Ref  Range Status  12/07/2017 NEGATIVE NEGATIVE mg/dL Final   Protein,UA  Date Value Ref Range Status  08/26/2020 Negative Negative/Trace Final   Total Protein  Date Value Ref Range Status  06/10/2021 6.5 6.0 - 8.5 g/dL Final   GFR calc Af Amer  Date Value Ref Range Status  05/27/2020 107 >59 mL/min/1.73 Final    Comment:    **In accordance with recommendations from the NKF-ASN Task force,**   [facility] is in the process  of updating its eGFR calculation to the   2021 CKD-EPI creatinine equation that estimates kidney function   without a race variable.    eGFR  Date Value Ref Range Status  06/10/2021 99 >59 mL/min/1.73 Final   GFR calc non Af Amer  Date Value Ref Range Status  05/27/2020 93 >59 mL/min/1.73 Final         Signed Prescriptions Disp Refills   albuterol (PROVENTIL) (2.5 MG/3ML) 0.083% nebulizer solution 150 mL 1    Sig: Take 3 mLs (2.5 mg total) by nebulization every 6 (six) hours as needed for wheezing or shortness of breath.     Pulmonology:  Beta Agonists 2 Passed - 06/22/2021  2:56 PM      Passed - Last BP in normal range    BP Readings from Last 1 Encounters:  06/10/21 117/71          Passed - Last Heart Rate in normal range    Pulse Readings from Last 1 Encounters:  06/10/21 63          Passed - Valid encounter within last 12 months    Recent Outpatient Visits           1 week ago IFG (impaired fasting glucose)   [facility], DO   10 months ago Benign paroxysmal positional vertigo of left ear   [facility] [place], Megan P, DO   11 months ago Achilles tendinitis of right lower extremity   [facility] [place], Megan P, DO   1 year ago Acute non-recurrent frontal sinusitis   [place], [place] T, NP   1 year ago Suspected COVID-19 virus infection   [facility] [place], [place], DO       Future Appointments             In 5 months Johnson, Megan P, DO [place], [facility]             albuterol (VENTOLIN HFA) 108 (90 Base) MCG/ACT inhaler 1 each 3    Sig: Inhale 2 puffs into the lungs every 4 (four) hours as needed for wheezing or shortness of breath.     Pulmonology:  Beta Agonists 2 Passed - 06/22/2021  2:56 PM      Passed - Last BP in normal range    BP Readings from Last 1 Encounters:  06/10/21 117/71          Passed - Last Heart Rate in normal  range    Pulse Readings from Last 1 Encounters:  06/10/21 63          Passed - Valid encounter within last 12 months    Recent Outpatient Visits           1 week ago IFG (impaired fasting glucose)   [facility], Megan P, DO   10 months ago Benign paroxysmal positional vertigo of left ear   [facility], [facility] P, DO   [place]  months ago Achilles tendinitis of right lower extremity   [place], [place], DO   1 year ago Acute non-recurrent frontal sinusitis   [place], [place] T, NP   1 year ago Suspected COVID-19 virus infection   [facility] [place], [place], DO       Future Appointments             In 5 months Johnson, Megan P, DO [facility], [facility]             benazepril (LOTENSIN) 20 MG tablet 90 tablet 1    Sig: Take 1 tablet (20 mg total) by mouth daily.     Cardiovascular:  ACE Inhibitors Passed - 06/22/2021  2:56 PM      Passed - Cr in normal range and within 180 days    Creatinine, Ser  Date Value Ref Range Status  06/10/2021 0.63 0.57 - 1.00 mg/dL Final          Passed - K in normal range and within 180 days    Potassium  Date Value Ref Range Status  06/10/2021 5.2 3.5 - 5.2 mmol/L Final          Passed - Patient is not pregnant      Passed - Last BP in normal range    BP Readings from Last 1 Encounters:  06/10/21 117/71          Passed - Valid encounter within last 6 months    Recent Outpatient Visits           1 week ago IFG (impaired fasting glucose)   [facility], Megan P, DO   10 months ago Benign paroxysmal positional vertigo of left ear   [facility] [place], Megan P, DO   11 months ago Achilles tendinitis of right lower extremity   [place], Megan P, DO   1 year ago Acute non-recurrent frontal sinusitis   [place], Parks[place] T, NP   1 year ago  Suspected COVID-19 virus infection   [facility] [place], [place], DO       Future Appointments             In 5 months Johnson, Megan P, DO [place], [facility]             etodolac (LODINE) 400 MG tablet 180 tablet 1    Sig: Take 1 tablet (400 mg total) by mouth 2 (two) times daily as needed.     Analgesics:  NSAIDS Failed - 06/22/2021  2:56 PM      Failed - Manual Review: Labs are only required if the patient has taken medication for more than 8 weeks.      Failed - HGB in normal range and within 360 days    Hemoglobin  Date Value Ref Range Status  02/08/2019 12.8 11.1 - 15.9 g/dL Final          Failed - PLT in normal range and within 360 days    Platelets  Date Value Ref Range Status  02/08/2019 337 150 - 450 x10E3/uL Final          Failed - HCT in normal range and within 360 days    Hematocrit  Date Value Ref Range Status  02/08/2019 36.2 34.0 - 46.6 % Final          Passed - Cr in normal range and within 360 days  Creatinine, Ser  Date Value Ref Range Status  06/10/2021 0.63 0.57 - 1.00 mg/dL Final          Passed - eGFR is 30 or above and within 360 days    GFR calc Af Amer  Date Value Ref Range Status  05/27/2020 107 >59 mL/min/1.73 Final    Comment:    **In accordance with recommendations from the NKF-ASN Task force,**   [facility] is in the process of updating its eGFR calculation to the   2021 CKD-EPI creatinine equation that estimates kidney function   without a race variable.    GFR calc non Af Amer  Date Value Ref Range Status  05/27/2020 93 >59 mL/min/1.73 Final   eGFR  Date Value Ref Range Status  06/10/2021 99 >59 mL/min/1.73 Final          Passed - Patient is not pregnant      Passed - Valid encounter within last 12 months    Recent Outpatient Visits           1 week ago IFG (impaired fasting glucose)   [facility], Megan P, DO   10 months ago Benign paroxysmal positional vertigo  of left ear   [facility] [place], Megan P, DO   11 months ago Achilles tendinitis of right lower extremity   [place], Megan P, DO   1 year ago Acute non-recurrent frontal sinusitis   [place], Henrine Screws T, NP   1 year ago Suspected COVID-19 virus infection   [employer], [place], DO       Future Appointments             In 5 months Johnson, Megan P, DO [place], [facility]             nystatin (MYCOSTATIN/NYSTOP) powder 15 g 3    Sig: Apply 1 application topically 3 (three) times daily.     Off-Protocol Failed - 06/22/2021  2:56 PM      Failed - Medication not assigned to a protocol, review manually.      Passed - Valid encounter within last 12 months    Recent Outpatient Visits           1 week ago IFG (impaired fasting glucose)   [facility], Megan P, DO   10 months ago Benign paroxysmal positional vertigo of left ear   [facility] [place], Megan P, DO   11 months ago Achilles tendinitis of right lower extremity   [facility] [place], Megan P, DO   1 year ago Acute non-recurrent frontal sinusitis   [place], [place] T, NP   1 year ago Suspected COVID-19 virus infection   [employer], [place], DO       Future Appointments             In 5 months Johnson, Megan P, DO [facility], [facility]               Requested Prescriptions  Pending Prescriptions Disp Refills   clonazePAM (KLONOPIN) 0.5 MG tablet 20 tablet 0    Sig: Take 1 tablet (0.5 mg total) by mouth 2 (two) times daily as needed for anxiety.     Not Delegated - Psychiatry: Anxiolytics/Hypnotics 2 Failed - 06/22/2021  2:56 PM      Failed - This refill cannot be delegated      Failed -  Urine Drug Screen completed in last 360 days      Passed - Patient is not pregnant      Passed - Valid encounter within last  6 months    Recent Outpatient Visits           1 week ago IFG (impaired fasting glucose)   [facility], Megan P, DO   10 months ago Benign paroxysmal positional vertigo of left ear   [facility] [place], Megan P, DO   11 months ago Achilles tendinitis of right lower extremity   [place], Megan P, DO   1 year ago Acute non-recurrent frontal sinusitis   [place], [place] T, NP   1 year ago Suspected COVID-19 virus infection   [employer], [place], DO       Future Appointments             In 5 months Johnson, Megan P, DO [place], [facility]             omeprazole (PRILOSEC) 20 MG capsule 90 capsule 1    Sig: Take 1 capsule (20 mg total) by mouth daily.     Gastroenterology: Proton Pump Inhibitors Passed - 06/22/2021  2:56 PM      Passed - Valid encounter within last 12 months    Recent Outpatient Visits           1 week ago IFG (impaired fasting glucose)   [facility], Megan P, DO   10 months ago Benign paroxysmal positional vertigo of left ear   [facility] [place], Megan P, DO   11 months ago Achilles tendinitis of right lower extremity   [place], Megan P, DO   1 year ago Acute non-recurrent frontal sinusitis   [place], Henrine Screws T, NP   1 year ago Suspected COVID-19 virus infection   [facility] [place], [place], DO       Future Appointments             In 5 months Johnson, Megan P, DO [place], [facility]             QUEtiapine (SEROQUEL) 25 MG tablet 270 tablet 1    Sig: TAKE 1-2 TABLETS DAILY AT BEDTIME. MAY TAKE ADDITIONAL 1/2 TABLET UP TO 3 TIMES DAILY AS NEEDED FOR ANXIETY     Not Delegated - Psychiatry:  Antipsychotics - Second Generation (Atypical) - quetiapine Failed - 06/22/2021  2:56 PM      Failed - This refill cannot be delegated       Failed - TSH in normal range and within 360 days    TSH  Date Value Ref Range Status  02/08/2019 0.470 0.450 - 4.500 uIU/mL Final          Failed - Lipid Panel in normal range within the last 12 months    Cholesterol, Total  Date Value Ref Range Status  06/10/2021 186 100 - 199 mg/dL Final   Cholesterol Piccolo, Waived  Date Value Ref Range Status  10/13/2015 214 (H) <200 mg/dL Final    Comment:                            Desirable                <200  Borderline High      200- 239                         High                     >239    LDL Chol Calc (NIH)  Date Value Ref Range Status  06/10/2021 108 (H) 0 - 99 mg/dL Final   HDL  Date Value Ref Range Status  06/10/2021 63 >39 mg/dL Final   Triglycerides  Date Value Ref Range Status  06/10/2021 81 0 - 149 mg/dL Final   Triglycerides Piccolo,Waived  Date Value Ref Range Status  10/13/2015 154 (H) <150 mg/dL Final    Comment:                            Normal                   <150                         Borderline High     150 - 199                         High                200 - 499                         Very High                >499          Failed - CBC within normal limits and completed in the last 12 months    WBC  Date Value Ref Range Status  02/08/2019 9.0 3.4 - 10.8 x10E3/uL Final  12/07/2017 9.8 3.6 - 11.0 K/uL Final   RBC  Date Value Ref Range Status  02/08/2019 3.96 3.77 - 5.28 x10E6/uL Final  12/07/2017 3.90 3.80 - 5.20 MIL/uL Final   Hemoglobin  Date Value Ref Range Status  02/08/2019 12.8 11.1 - 15.9 g/dL Final   Hematocrit  Date Value Ref Range Status  02/08/2019 36.2 34.0 - 46.6 % Final   MCHC  Date Value Ref Range Status  02/08/2019 35.4 31.5 - 35.7 g/dL Final  12/07/2017 33.9 32.0 - 36.0 g/dL Final   [facility]  Date Value Ref Range Status  02/08/2019 32.3 26.6 - 33.0 pg Final  12/07/2017 32.4 26.0 - 34.0 pg Final   MCV  Date Value Ref Range Status   02/08/2019 91 79 - 97 fL Final   No results found for: PLTCOUNTKUC, LABPLAT, POCPLA RDW  Date Value Ref Range Status  02/08/2019 11.6 (L) 11.7 - 15.4 % Final         Passed - Completed PHQ-2 or PHQ-9 in the last 360 days      Passed - Last BP in normal range    BP Readings from Last 1 Encounters:  06/10/21 117/71          Passed - Last Heart Rate in normal range    Pulse Readings from Last 1 Encounters:  06/10/21 63          Passed - Valid encounter within last 6 months    Recent Outpatient Visits  1 week ago IFG (impaired fasting glucose)   [facility], Megan P, DO   10 months ago Benign paroxysmal positional vertigo of left ear   [facility] [place], Megan P, DO   11 months ago Achilles tendinitis of right lower extremity   [facility] [place], Megan P, DO   1 year ago Acute non-recurrent frontal sinusitis   [facility] Marnee Guarneri T, NP   1 year ago Suspected COVID-19 virus infection   [facility] [place], [place], DO       Future Appointments             In 5 months Johnson, Megan P, DO [facility], [facility]            Passed - CMP within normal limits and completed in the last 12 months    Albumin  Date Value Ref Range Status  06/10/2021 4.4 3.8 - 4.8 g/dL Final   Alkaline Phosphatase  Date Value Ref Range Status  06/10/2021 75 44 - 121 IU/L Final   ALT  Date Value Ref Range Status  06/10/2021 12 0 - 32 IU/L Final   AST  Date Value Ref Range Status  06/10/2021 19 0 - 40 IU/L Final   BUN  Date Value Ref Range Status  06/10/2021 15 8 - 27 mg/dL Final   Calcium  Date Value Ref Range Status  06/10/2021 9.8 8.7 - 10.3 mg/dL Final   CO2  Date Value Ref Range Status  06/10/2021 25 20 - 29 mmol/L Final   Creatinine, Ser  Date Value Ref Range Status  06/10/2021 0.63 0.57 - 1.00 mg/dL Final   Glucose  Date Value Ref Range Status  06/10/2021 97  70 - 99 mg/dL Final   Glucose, Bld  Date Value Ref Range Status  12/07/2017 105 (H) 70 - 99 mg/dL Final   Glucose-Capillary  Date Value Ref Range Status  10/18/2017 102 (H) 70 - 99 mg/dL Final   Potassium  Date Value Ref Range Status  06/10/2021 5.2 3.5 - 5.2 mmol/L Final   Sodium  Date Value Ref Range Status  06/10/2021 141 134 - 144 mmol/L Final   Bilirubin Total  Date Value Ref Range Status  06/10/2021 0.4 0.0 - 1.2 mg/dL Final   Protein, ur  Date Value Ref Range Status  12/07/2017 NEGATIVE NEGATIVE mg/dL Final   Protein,UA  Date Value Ref Range Status  08/26/2020 Negative Negative/Trace Final   Total Protein  Date Value Ref Range Status  06/10/2021 6.5 6.0 - 8.5 g/dL Final   GFR calc Af Amer  Date Value Ref Range Status  05/27/2020 107 >59 mL/min/1.73 Final    Comment:    **In accordance with recommendations from the NKF-ASN Task force,**   [facility] is in the process of updating its eGFR calculation to the   2021 CKD-EPI creatinine equation that estimates kidney function   without a race variable.    eGFR  Date Value Ref Range Status  06/10/2021 99 >59 mL/min/1.73 Final   GFR calc non Af Amer  Date Value Ref Range Status  05/27/2020 93 >59 mL/min/1.73 Final         Signed Prescriptions Disp Refills   albuterol (PROVENTIL) (2.5 MG/3ML) 0.083% nebulizer solution 150 mL 1    Sig: Take 3 mLs (2.5 mg total) by nebulization every 6 (six) hours as needed for wheezing or shortness of breath.     Pulmonology:  Beta Agonists 2 Passed -  06/22/2021  2:56 PM      Passed - Last BP in normal range    BP Readings from Last 1 Encounters:  06/10/21 117/71          Passed - Last Heart Rate in normal range    Pulse Readings from Last 1 Encounters:  06/10/21 63          Passed - Valid encounter within last 12 months    Recent Outpatient Visits           1 week ago IFG (impaired fasting glucose)   [facility], [facility] P, DO   10 months ago  Benign paroxysmal positional vertigo of left ear   [facility], Megan P, DO   11 months ago Achilles tendinitis of right lower extremity   [facility] [place], Megan P, DO   1 year ago Acute non-recurrent frontal sinusitis   [place], [place] T, NP   1 year ago Suspected COVID-19 virus infection   [facility] [place], [place], DO       Future Appointments             In 5 months Johnson, Megan P, DO [place], [facility]             albuterol (VENTOLIN HFA) 108 (90 Base) MCG/ACT inhaler 1 each 3    Sig: Inhale 2 puffs into the lungs every 4 (four) hours as needed for wheezing or shortness of breath.     Pulmonology:  Beta Agonists 2 Passed - 06/22/2021  2:56 PM      Passed - Last BP in normal range    BP Readings from Last 1 Encounters:  06/10/21 117/71          Passed - Last Heart Rate in normal range    Pulse Readings from Last 1 Encounters:  06/10/21 63          Passed - Valid encounter within last 12 months    Recent Outpatient Visits           1 week ago IFG (impaired fasting glucose)   [facility], Megan P, DO   10 months ago Benign paroxysmal positional vertigo of left ear   [facility] [place], Megan P, DO   11 months ago Achilles tendinitis of right lower extremity   [facility] [place], Megan P, DO   1 year ago Acute non-recurrent frontal sinusitis   [place], [place] T, NP   1 year ago Suspected COVID-19 virus infection   [facility] [facility] [place], [place], DO       Future Appointments             In 5 months Johnson, Megan P, DO [place], [facility]             benazepril (LOTENSIN) 20 MG tablet 90 tablet 1    Sig: Take 1 tablet (20 mg total) by mouth daily.     Cardiovascular:  ACE Inhibitors Passed - 06/22/2021  2:56 PM      Passed - Cr in normal range and within 180 days     Creatinine, Ser  Date Value Ref Range Status  06/10/2021 0.63 0.57 - 1.00 mg/dL Final          Passed - K in normal range and within 180 days    Potassium  Date Value Ref Range Status  06/10/2021 5.2 3.5 -  5.2 mmol/L Final          Passed - Patient is not pregnant      Passed - Last BP in normal range    BP Readings from Last 1 Encounters:  06/10/21 117/71          Passed - Valid encounter within last 6 months    Recent Outpatient Visits           1 week ago IFG (impaired fasting glucose)   [facility], Megan P, DO   10 months ago Benign paroxysmal positional vertigo of left ear   [facility] [place], Megan P, DO   11 months ago Achilles tendinitis of right lower extremity   [place], Megan P, DO   1 year ago Acute non-recurrent frontal sinusitis   [place], [place] T, NP   1 year ago Suspected COVID-19 virus infection   [facility] [place], [place], DO       Future Appointments             In 5 months Johnson, Megan P, DO [place], [facility]             etodolac (LODINE) 400 MG tablet 180 tablet 1    Sig: Take 1 tablet (400 mg total) by mouth 2 (two) times daily as needed.     Analgesics:  NSAIDS Failed - 06/22/2021  2:56 PM      Failed - Manual Review: Labs are only required if the patient has taken medication for more than 8 weeks.      Failed - HGB in normal range and within 360 days    Hemoglobin  Date Value Ref Range Status  02/08/2019 12.8 11.1 - 15.9 g/dL Final          Failed - PLT in normal range and within 360 days    Platelets  Date Value Ref Range Status  02/08/2019 337 150 - 450 x10E3/uL Final          Failed - HCT in normal range and within 360 days    Hematocrit  Date Value Ref Range Status  02/08/2019 36.2 34.0 - 46.6 % Final          Passed - Cr in normal range and within 360 days    Creatinine, Ser  Date Value Ref  Range Status  06/10/2021 0.63 0.57 - 1.00 mg/dL Final          Passed - eGFR is 30 or above and within 360 days    GFR calc Af Amer  Date Value Ref Range Status  05/27/2020 107 >59 mL/min/1.73 Final    Comment:    **In accordance with recommendations from the NKF-ASN Task force,**   [facility] is in the process of updating its eGFR calculation to the   2021 CKD-EPI creatinine equation that estimates kidney function   without a race variable.    GFR calc non Af Amer  Date Value Ref Range Status  05/27/2020 93 >59 mL/min/1.73 Final   eGFR  Date Value Ref Range Status  06/10/2021 99 >59 mL/min/1.73 Final          Passed - Patient is not pregnant      Passed - Valid encounter within last 12 months    Recent Outpatient Visits           1 week ago IFG (impaired fasting glucose)   [facility], [place], DO  10 months ago Benign paroxysmal positional vertigo of left ear   [facility] [place], Megan P, DO   11 months ago Achilles tendinitis of right lower extremity   [place], [place], DO   1 year ago Acute non-recurrent frontal sinusitis   [place], Henrine Screws T, NP   1 year ago Suspected COVID-19 virus infection   [facility] [place], [place], DO       Future Appointments             In 5 months Johnson, Megan P, DO [facility], [facility]             nystatin (MYCOSTATIN/NYSTOP) powder 15 g 3    Sig: Apply 1 application topically 3 (three) times daily.     Off-Protocol Failed - 06/22/2021  2:56 PM      Failed - Medication not assigned to a protocol, review manually.      Passed - Valid encounter within last 12 months    Recent Outpatient Visits           1 week ago IFG (impaired fasting glucose)   [facility], Megan P, DO   10 months ago Benign paroxysmal positional vertigo of left ear   [facility] [place], Megan P, DO   11  months ago Achilles tendinitis of right lower extremity   [place], Megan P, DO   1 year ago Acute non-recurrent frontal sinusitis   [facility] [place], Henrine Screws T, NP   1 year ago Suspected COVID-19 virus infection   [facility] [place], [place], DO       Future Appointments             In 5 months Johnson, Barb Merino, DO [employer], [facility]

## 2021-06-23 NOTE — Telephone Encounter (Signed)
Routing to provider to advise.  

## 2021-06-23 NOTE — Telephone Encounter (Signed)
Call to patient- she has changed pharmacy due to cost- she is uninsured at this time. Rx to be forwarded to new pharmacy. Requested Prescriptions  Pending Prescriptions Disp Refills   albuterol (PROVENTIL) (2.5 MG/3ML) 0.083% nebulizer solution 150 mL 1    Sig: Take 3 mLs (2.5 mg total) by nebulization every 6 (six) hours as needed for wheezing or shortness of breath.     Pulmonology:  Beta Agonists 2 Passed - 06/22/2021  2:56 PM      Passed - Last BP in normal range    BP Readings from Last 1 Encounters:  06/10/21 117/71         Passed - Last Heart Rate in normal range    Pulse Readings from Last 1 Encounters:  06/10/21 63         Passed - Valid encounter within last 12 months    Recent Outpatient Visits          1 week ago IFG (impaired fasting glucose)   [facility], Megan P, DO   10 months ago Benign paroxysmal positional vertigo of left ear   [facility] [place], Megan P, DO   11 months ago Achilles tendinitis of right lower extremity   [facility] [place], Megan P, DO   1 year ago Acute non-recurrent frontal sinusitis   [place], [place] T, NP   1 year ago Suspected COVID-19 virus infection   [facility] [place], [place], DO      Future Appointments            In 5 months Johnson, Megan P, DO [place], [facility]            albuterol (VENTOLIN HFA) 108 (90 Base) MCG/ACT inhaler 1 each 3    Sig: Inhale 2 puffs into the lungs every 4 (four) hours as needed for wheezing or shortness of breath.     Pulmonology:  Beta Agonists 2 Passed - 06/22/2021  2:56 PM      Passed - Last BP in normal range    BP Readings from Last 1 Encounters:  06/10/21 117/71         Passed - Last Heart Rate in normal range    Pulse Readings from Last 1 Encounters:  06/10/21 63         Passed - Valid encounter within last 12 months    Recent Outpatient Visits          1 week ago IFG (impaired  fasting glucose)   [facility], Megan P, DO   10 months ago Benign paroxysmal positional vertigo of left ear   [facility] [place], Megan P, DO   11 months ago Achilles tendinitis of right lower extremity   [facility] [place], Megan P, DO   1 year ago Acute non-recurrent frontal sinusitis   [place], [place] T, NP   1 year ago Suspected COVID-19 virus infection   [facility] [place], [place], DO      Future Appointments            In 5 months Johnson, Megan P, DO [place], [facility]            benazepril (LOTENSIN) 20 MG tablet 90 tablet 1    Sig: Take 1 tablet (20 mg total) by mouth daily.     Cardiovascular:  ACE Inhibitors Passed - 06/22/2021  2:56 PM  Passed - Cr in normal range and within 180 days    Creatinine, Ser  Date Value Ref Range Status  06/10/2021 0.63 0.57 - 1.00 mg/dL Final         Passed - K in normal range and within 180 days    Potassium  Date Value Ref Range Status  06/10/2021 5.2 3.5 - 5.2 mmol/L Final         Passed - Patient is not pregnant      Passed - Last BP in normal range    BP Readings from Last 1 Encounters:  06/10/21 117/71         Passed - Valid encounter within last 6 months    Recent Outpatient Visits          1 week ago IFG (impaired fasting glucose)   [facility], Megan P, DO   10 months ago Benign paroxysmal positional vertigo of left ear   [facility] [place], Megan P, DO   11 months ago Achilles tendinitis of right lower extremity   [place], Megan P, DO   1 year ago Acute non-recurrent frontal sinusitis   [place], [place] T, NP   1 year ago Suspected COVID-19 virus infection   [employer], [place], DO      Future Appointments            In 5 months Johnson, Megan P, DO [place], [facility]             clonazePAM (KLONOPIN) 0.5 MG tablet 20 tablet 0    Sig: Take 1 tablet (0.5 mg total) by mouth 2 (two) times daily as needed for anxiety.     Not Delegated - Psychiatry: Anxiolytics/Hypnotics 2 Failed - 06/22/2021  2:56 PM      Failed - This refill cannot be delegated      Failed - Urine Drug Screen completed in last 360 days      Passed - Patient is not pregnant      Passed - Valid encounter within last 6 months    Recent Outpatient Visits          1 week ago IFG (impaired fasting glucose)   [facility], Megan P, DO   10 months ago Benign paroxysmal positional vertigo of left ear   [facility] [place], Megan P, DO   11 months ago Achilles tendinitis of right lower extremity   [place], Megan P, DO   1 year ago Acute non-recurrent frontal sinusitis   [place], [place] T, NP   1 year ago Suspected COVID-19 virus infection   [employer], [place], DO      Future Appointments            In 5 months Johnson, Megan P, DO [place], [facility]            etodolac (LODINE) 400 MG tablet 180 tablet 1    Sig: Take 1 tablet (400 mg total) by mouth 2 (two) times daily as needed.     Analgesics:  NSAIDS Failed - 06/22/2021  2:56 PM      Failed - Manual Review: Labs are only required if the patient has taken medication for more than 8 weeks.      Failed - HGB in normal range and within 360 days    Hemoglobin  Date Value Ref Range Status  02/08/2019 12.8 11.1 - 15.9 g/dL Final         Failed - PLT in normal range and within 360 days    Platelets  Date Value Ref Range Status  02/08/2019 337 150 - 450 x10E3/uL Final         Failed - HCT in normal range and within 360 days    Hematocrit  Date Value Ref Range Status  02/08/2019 36.2 34.0 - 46.6 % Final         Passed - Cr in normal range and within 360 days    Creatinine, Ser  Date Value Ref Range Status  06/10/2021  0.63 0.57 - 1.00 mg/dL Final         Passed - eGFR is 30 or above and within 360 days    GFR calc Af Amer  Date Value Ref Range Status  05/27/2020 107 >59 mL/min/1.73 Final    Comment:    **In accordance with recommendations from the NKF-ASN Task force,**   [facility] is in the process of updating its eGFR calculation to the   2021 CKD-EPI creatinine equation that estimates kidney function   without a race variable.    GFR calc non Af Amer  Date Value Ref Range Status  05/27/2020 93 >59 mL/min/1.73 Final   eGFR  Date Value Ref Range Status  06/10/2021 99 >59 mL/min/1.73 Final         Passed - Patient is not pregnant      Passed - Valid encounter within last 12 months    Recent Outpatient Visits          1 week ago IFG (impaired fasting glucose)   [facility], Megan P, DO   10 months ago Benign paroxysmal positional vertigo of left ear   [facility] [place], Megan P, DO   11 months ago Achilles tendinitis of right lower extremity   [place], Megan P, DO   1 year ago Acute non-recurrent frontal sinusitis   [place], Henrine Screws T, NP   1 year ago Suspected COVID-19 virus infection   [employer], [place], DO      Future Appointments            In 5 months Johnson, Megan P, DO [place], [facility]            nystatin (MYCOSTATIN/NYSTOP) powder 15 g 3    Sig: Apply 1 application topically 3 (three) times daily.     Off-Protocol Failed - 06/22/2021  2:56 PM      Failed - Medication not assigned to a protocol, review manually.      Passed - Valid encounter within last 12 months    Recent Outpatient Visits          1 week ago IFG (impaired fasting glucose)   [facility], Megan P, DO   10 months ago Benign paroxysmal positional vertigo of left ear   [facility] [place], Megan P, DO   11 months ago Achilles tendinitis of right  lower extremity   [place], Megan P, DO   1 year ago Acute non-recurrent frontal sinusitis   [facility] [place], Henrine Screws T, NP   1 year ago Suspected COVID-19 virus infection   [facility] [place], [place], DO      Future Appointments            In 5 months [place], [place]  P, DO [facility], [facility]            omeprazole (PRILOSEC) 20 MG capsule 90 capsule 1    Sig: Take 1 capsule (20 mg total) by mouth daily.     Gastroenterology: Proton Pump Inhibitors Passed - 06/22/2021  2:56 PM      Passed - Valid encounter within last 12 months    Recent Outpatient Visits          1 week ago IFG (impaired fasting glucose)   [facility], Megan P, DO   10 months ago Benign paroxysmal positional vertigo of left ear   [facility] [place], Megan P, DO   11 months ago Achilles tendinitis of right lower extremity   [place], Megan P, DO   1 year ago Acute non-recurrent frontal sinusitis   [place], Henrine Screws T, NP   1 year ago Suspected COVID-19 virus infection   [facility] [place], [place], DO      Future Appointments            In 5 months Johnson, Megan P, DO [facility], [facility]            QUEtiapine (SEROQUEL) 25 MG tablet 270 tablet 1    Sig: TAKE 1-2 TABLETS DAILY AT BEDTIME. MAY TAKE ADDITIONAL 1/2 TABLET UP TO 3 TIMES DAILY AS NEEDED FOR ANXIETY     Not Delegated - Psychiatry:  Antipsychotics - Second Generation (Atypical) - quetiapine Failed - 06/22/2021  2:56 PM      Failed - This refill cannot be delegated      Failed - TSH in normal range and within 360 days    TSH  Date Value Ref Range Status  02/08/2019 0.470 0.450 - 4.500 uIU/mL Final         Failed - Lipid Panel in normal range within the last 12 months    Cholesterol, Total  Date Value Ref Range Status  06/10/2021 186 100 - 199 mg/dL Final    Cholesterol [place], Waived  Date Value Ref Range Status  10/13/2015 214 (H) <200 mg/dL Final    Comment:                            Desirable                <200                         Borderline High      200- 239                         High                     >239    LDL Chol Calc (NIH)  Date Value Ref Range Status  06/10/2021 108 (H) 0 - 99 mg/dL Final   HDL  Date Value Ref Range Status  06/10/2021 63 >39 mg/dL Final   Triglycerides  Date Value Ref Range Status  06/10/2021 81 0 - 149 mg/dL Final   Triglycerides Piccolo,Waived  Date Value Ref Range Status  10/13/2015 154 (H) <150 mg/dL Final    Comment:                            Normal                   <  150                         Borderline High     150 - 199                         High                200 - 499                         Very High                >499          Failed - CBC within normal limits and completed in the last 12 months    WBC  Date Value Ref Range Status  02/08/2019 9.0 3.4 - 10.8 x10E3/uL Final  12/07/2017 9.8 3.6 - 11.0 K/uL Final   RBC  Date Value Ref Range Status  02/08/2019 3.96 3.77 - 5.28 x10E6/uL Final  12/07/2017 3.90 3.80 - 5.20 MIL/uL Final   Hemoglobin  Date Value Ref Range Status  02/08/2019 12.8 11.1 - 15.9 g/dL Final   Hematocrit  Date Value Ref Range Status  02/08/2019 36.2 34.0 - 46.6 % Final   MCHC  Date Value Ref Range Status  02/08/2019 35.4 31.5 - 35.7 g/dL Final  12/07/2017 33.9 32.0 - 36.0 g/dL Final   [facility]  Date Value Ref Range Status  02/08/2019 32.3 26.6 - 33.0 pg Final  12/07/2017 32.4 26.0 - 34.0 pg Final   MCV  Date Value Ref Range Status  02/08/2019 91 79 - 97 fL Final   No results found for: PLTCOUNTKUC, LABPLAT, POCPLA RDW  Date Value Ref Range Status  02/08/2019 11.6 (L) 11.7 - 15.4 % Final         Passed - Completed PHQ-2 or PHQ-9 in the last 360 days      Passed - Last BP in normal range    BP Readings from Last 1  Encounters:  06/10/21 117/71         Passed - Last Heart Rate in normal range    Pulse Readings from Last 1 Encounters:  06/10/21 63         Passed - Valid encounter within last 6 months    Recent Outpatient Visits          1 week ago IFG (impaired fasting glucose)   [facility] [place], Megan P, DO   10 months ago Benign paroxysmal positional vertigo of left ear   [facility] [place], Megan P, DO   11 months ago Achilles tendinitis of right lower extremity   [facility] [place], Megan P, DO   1 year ago Acute non-recurrent frontal sinusitis   [facility] [place], Henrine Screws T, NP   1 year ago Suspected COVID-19 virus infection   [facility] [place], [place], DO      Future Appointments            In 5 months Johnson, Megan P, DO [facility], [facility]           Passed - CMP within normal limits and completed in the last 12 months    Albumin  Date Value Ref Range Status  06/10/2021 4.4 3.8 - 4.8 g/dL Final   Alkaline Phosphatase  Date Value Ref Range Status  06/10/2021 75 44 - 121  IU/L Final   ALT  Date Value Ref Range Status  06/10/2021 12 0 - 32 IU/L Final   AST  Date Value Ref Range Status  06/10/2021 19 0 - 40 IU/L Final   BUN  Date Value Ref Range Status  06/10/2021 15 8 - 27 mg/dL Final   Calcium  Date Value Ref Range Status  06/10/2021 9.8 8.7 - 10.3 mg/dL Final   CO2  Date Value Ref Range Status  06/10/2021 25 20 - 29 mmol/L Final   Creatinine, Ser  Date Value Ref Range Status  06/10/2021 0.63 0.57 - 1.00 mg/dL Final   Glucose  Date Value Ref Range Status  06/10/2021 97 70 - 99 mg/dL Final   Glucose, Bld  Date Value Ref Range Status  12/07/2017 105 (H) 70 - 99 mg/dL Final   Glucose-Capillary  Date Value Ref Range Status  10/18/2017 102 (H) 70 - 99 mg/dL Final   Potassium  Date Value Ref Range Status  06/10/2021 5.2 3.5 - 5.2 mmol/L Final   Sodium  Date Value Ref  Range Status  06/10/2021 141 134 - 144 mmol/L Final   Bilirubin Total  Date Value Ref Range Status  06/10/2021 0.4 0.0 - 1.2 mg/dL Final   Protein, ur  Date Value Ref Range Status  12/07/2017 NEGATIVE NEGATIVE mg/dL Final   Protein,UA  Date Value Ref Range Status  08/26/2020 Negative Negative/Trace Final   Total Protein  Date Value Ref Range Status  06/10/2021 6.5 6.0 - 8.5 g/dL Final   GFR calc Af Amer  Date Value Ref Range Status  05/27/2020 107 >59 mL/min/1.73 Final    Comment:    **In accordance with recommendations from the NKF-ASN Task force,**   [facility] is in the process of updating its eGFR calculation to the   2021 CKD-EPI creatinine equation that estimates kidney function   without a race variable.    eGFR  Date Value Ref Range Status  06/10/2021 99 >59 mL/min/1.73 Final   GFR calc non Af Amer  Date Value Ref Range Status  05/27/2020 93 >59 mL/min/1.73 Final

## 2021-06-23 NOTE — Telephone Encounter (Signed)
Patient called to change pharmacy- while speaking with patient - she wanted to update provider and request new Rx Patient states the yeast infection under her breast is better and has improved with the nystatin powder. Patient states she has odor and some small "cut" areas that do bleed at times- she states she is keeping the area dry and clean. Patient wonders if medication like Diflucan would be helpful to help clear area systemically and she is requesting PCP call her Rx if she feels that would be helpful. Please let her know response. Patient had changed pharmacy to [facility]

## 2021-06-23 NOTE — Telephone Encounter (Signed)
appt

## 2021-06-24 ENCOUNTER — Ambulatory Visit (INDEPENDENT_AMBULATORY_CARE_PROVIDER_SITE_OTHER): Payer: Self-pay | Admitting: Nurse Practitioner

## 2021-06-24 ENCOUNTER — Encounter: Payer: Self-pay | Admitting: Nurse Practitioner

## 2021-06-24 ENCOUNTER — Other Ambulatory Visit: Payer: Self-pay

## 2021-06-24 VITALS — BP 138/73 | HR 76 | Temp 98.3°F | Wt 198.2 lb

## 2021-06-24 DIAGNOSIS — R21 Rash and other nonspecific skin eruption: Secondary | ICD-10-CM

## 2021-06-24 MED ORDER — NYSTATIN 100000 UNIT/GM EX CREA: 1.0000 "application " | TOPICAL_CREAM | Freq: Two times a day (BID) | CUTANEOUS | 1 refills | Status: DC

## 2021-06-24 MED ORDER — FLUCONAZOLE 150 MG PO TABS: 150.0000 mg | ORAL_TABLET | Freq: Once | ORAL | 0 refills | Status: AC

## 2021-06-24 NOTE — Progress Notes (Signed)
? ?BP 138/73 (BP Location: Right Arm, Cuff Size: Normal)   Pulse 76   Temp 98.3 ?F (36.8 ?C) (Oral)   Wt 198 lb 3.2 oz (89.9 kg)   BMI 36.25 kg/m?   ? ?Subjective:  ? ? Patient ID: Jaime Freeman, female    DOB: [birth year], 66 y.o.   MRN: [id number] ? ?HPI: ?Jaime Freeman is a 66 y.o. female ? ?Chief Complaint  ?Patient presents with  ? Rash  ?  Pt states she has a rash under her stomach that has been itching and very red. States it started about a month ago.   ? ?RASH ?Duration:  months  (1) ?Location:  abdomen   ?Itching: yes ?Burning: yes ?Redness: yes ?Oozing: yes - clear ?Scaling: no ?Blisters: no ?Painful: yes ?Fevers: no ?Change in detergents/soaps/personal care products: no ?Recent illness: no ?Recent travel:no ?History of same: no ?Context: worse ?Alleviating factors: nystatin powder helped ?Treatments attempted:nothing ?Shortness of breath: no  ?Throat/tongue swelling: no ?Myalgias/arthralgias: no ? ? ?Relevant past medical, surgical, family and social history reviewed and updated as indicated. Interim medical history since our last visit reviewed. ?Allergies and medications reviewed and updated. ? ?Review of Systems  ?Skin:  Positive for rash.  ? ?Per HPI unless specifically indicated above ? ?   ?Objective:  ?  ?BP 138/73 (BP Location: Right Arm, Cuff Size: Normal)   Pulse 76   Temp 98.3 ?F (36.8 ?C) (Oral)   Wt 198 lb 3.2 oz (89.9 kg)   BMI 36.25 kg/m?   ?Wt Readings from Last 3 Encounters:  ?06/24/21 198 lb 3.2 oz (89.9 kg)  ?06/10/21 199 lb (90.3 kg)  ?08/26/20 199 lb 3.2 oz (90.4 kg)  ?  ?Physical Exam ?Vitals and nursing note reviewed.  ?Constitutional:   ?   General: She is not in acute distress. ?   Appearance: Normal appearance. She is normal weight. She is not ill-appearing, toxic-appearing or diaphoretic.  ?HENT:  ?   Head: Normocephalic.  ?   Right Ear: External ear normal.  ?   Left Ear: External ear normal.  ?   Nose: Nose normal.  ?   Mouth/Throat:  ?   Mouth: Mucous membranes are  moist.  ?   Pharynx: Oropharynx is clear.  ?Eyes:  ?   General:     ?   Right eye: No discharge.     ?   Left eye: No discharge.  ?   Extraocular Movements: Extraocular movements intact.  ?   Conjunctiva/sclera: Conjunctivae normal.  ?   Pupils: Pupils are equal, round, and reactive to light.  ?Cardiovascular:  ?   Rate and Rhythm: Normal rate and regular rhythm.  ?   Heart sounds: No murmur heard. ?Pulmonary:  ?   Effort: Pulmonary effort is normal. No respiratory distress.  ?   Breath sounds: Normal breath sounds. No wheezing or rales.  ?Musculoskeletal:  ?   Cervical back: Normal range of motion and neck supple.  ?Skin: ?   General: Skin is warm and dry.  ?   Capillary Refill: Capillary refill takes less than 2 seconds.  ? ?    ?Neurological:  ?   General: No focal deficit present.  ?   Mental Status: She is alert and oriented to person, place, and time. Mental status is at baseline.  ?Psychiatric:     ?   Mood and Affect: Mood normal.     ?   Behavior: Behavior normal.     ?  Thought Content: Thought content normal.     ?   Judgment: Judgment normal.  ? ? ?Results for orders placed or performed in visit on 06/10/21  ?Bayer DCA Hb A1c Waived  ?Result Value Ref Range  ? HB A1C (BAYER DCA - WAIVED) 5.6 4.8 - 5.6 %  ?Comprehensive metabolic panel  ?Result Value Ref Range  ? Glucose 97 70 - 99 mg/dL  ? BUN 15 8 - 27 mg/dL  ? Creatinine, Ser 0.63 0.57 - 1.00 mg/dL  ? eGFR 99 >59 mL/min/1.73  ? BUN/Creatinine Ratio 24 12 - 28  ? Sodium 141 134 - 144 mmol/L  ? Potassium 5.2 3.5 - 5.2 mmol/L  ? Chloride 105 96 - 106 mmol/L  ? CO2 25 20 - 29 mmol/L  ? Calcium 9.8 8.7 - 10.3 mg/dL  ? Total Protein 6.5 6.0 - 8.5 g/dL  ? Albumin 4.4 3.8 - 4.8 g/dL  ? Globulin, Total 2.1 1.5 - 4.5 g/dL  ? Albumin/Globulin Ratio 2.1 1.2 - 2.2  ? Bilirubin Total 0.4 0.0 - 1.2 mg/dL  ? Alkaline Phosphatase 75 44 - 121 IU/L  ? AST 19 0 - 40 IU/L  ? ALT 12 0 - 32 IU/L  ?Lipid Panel w/o Chol/HDL Ratio  ?Result Value Ref Range  ? Cholesterol, Total  186 100 - 199 mg/dL  ? Triglycerides 81 0 - 149 mg/dL  ? HDL 63 >39 mg/dL  ? VLDL Cholesterol Cal 15 5 - 40 mg/dL  ? LDL Chol Calc (NIH) 108 (H) 0 - 99 mg/dL  ?Microalbumin, Urine Waived  ?Result Value Ref Range  ? Microalb, Ur Waived 10 0 - 19 mg/L  ? Creatinine, Urine Waived 100 10 - 300 mg/dL  ? Microalb/Creat Ratio <30 <30 mg/g  ? ?   ?Assessment & Plan:  ? ?Problem List Items Addressed This Visit   ?None ?Visit Diagnoses   ? ? Rash of body    -  Primary  ? Yeast rash under panus. Will treat with Nystatin cream and fluconazole. Discussed keeping area dry and the skin separated. FU if symptoms do not improve.  ? ?  ?  ? ?Follow up plan: ?Return if symptoms worsen or fail to improve. ? ? ? ? ? ?

## 2021-06-25 ENCOUNTER — Telehealth: Payer: Self-pay | Admitting: Family Medicine

## 2021-07-27 ENCOUNTER — Other Ambulatory Visit: Payer: Self-pay | Admitting: Family Medicine

## 2021-07-27 NOTE — Telephone Encounter (Signed)
Pt called saying she had told the office to stop the Prilosec but she is now having heartburn again and wants to get ir restarted.   ? ?She uses [facility] pharmacy ? ?CB#  [phone] ?

## 2021-07-28 ENCOUNTER — Telehealth: Payer: Self-pay | Admitting: Family Medicine

## 2021-07-28 MED ORDER — OMEPRAZOLE 20 MG PO CPDR: 20.0000 mg | DELAYED_RELEASE_CAPSULE | Freq: Every day | ORAL | 1 refills | Status: DC

## 2021-07-28 NOTE — Addendum Note (Signed)
Addended by: Durwin Nora on: 07/28/2021 11:50 AM ? ? Modules accepted: Orders ? ?

## 2021-07-28 NOTE — Telephone Encounter (Signed)
Copied from [place] [phone]. Topic: General - Other ?>> Jul 28, 2021 12:18 PM [name] A wrote: ?Reason for CRM: The patient would like to speak with a member of staff about their prescriptions  ? ?The patient would like an update on their previously requested omeprazole (PRILOSEC) 20 MG capsule [[id number]]  ? ?The patient's daughter in law will be picking up medications around 5 and the patient would like to speak with someone prior to that  ? ?Please contact further when possible ?

## 2021-07-28 NOTE — Addendum Note (Signed)
Addended by: Matilde Sprang on: 07/28/2021 05:57 PM ? ? Modules accepted: Orders ? ?

## 2021-07-28 NOTE — Telephone Encounter (Signed)
Medication refill resubmitted today as the original one sent in was Print. Receipt confirmed by pharmacy 07/28/21 at 1757. ?

## 2021-07-28 NOTE — Telephone Encounter (Signed)
Requested Prescriptions  ?Pending Prescriptions Disp Refills  ?? omeprazole (PRILOSEC) 20 MG capsule 90 capsule 1  ?  Sig: Take 1 capsule (20 mg total) by mouth daily.  ?  ? Gastroenterology: Proton Pump Inhibitors Passed - 07/28/2021 11:50 AM  ?  ?  Passed - Valid encounter within last 12 months  ?  Recent Outpatient Visits   ?      ? 1 month ago Rash of body  ? Oval, NP  ? 1 month ago IFG (impaired fasting glucose)  ? Natalia, Megan P, DO  ? 11 months ago Benign paroxysmal positional vertigo of left ear  ? Pickrell, DO  ? 1 year ago Achilles tendinitis of right lower extremity  ? Coffey, [place] P, DO  ? 1 year ago Acute non-recurrent frontal sinusitis  ? [facility] [place], Henrine Screws T, NP  ?  ?  ?Future Appointments   ?        ? In 4 months Wynetta Emery, Barb Merino, DO [facility]  ?  ? ?  ?  ?  ? ? ?

## 2021-08-14 ENCOUNTER — Ambulatory Visit: Payer: Self-pay | Admitting: *Deleted

## 2021-08-14 NOTE — Telephone Encounter (Signed)
Reason for Disposition ? [1] Caller has URGENT medicine question about med that PCP or specialist prescribed AND [2] triager unable to answer question ? ?Answer Assessment - Initial Assessment Questions ?1. NAME of MEDICATION: "What medicine are you calling about?" ?    Seroquel- 4 years- patient has had no problems.Last month patient had to change manufacturer with close of pharmacy/change of pharmacy ?2. QUESTION: "What is your question?" (e.g., double dose of medicine, side effect) ?    SE- with new pills- same dosing- not feeling normal- anxious ?3. PRESCRIBING HCP: "Who prescribed it?" Reason: if prescribed by specialist, call should be referred to that group. ?    PCP ?4. SYMPTOMS: "Do you have any symptoms?" ?    Feeling weak feeling, drained feeling- patient reports she is very sensitive to medications, lack of energy ?5. SEVERITY: If symptoms are present, ask "Are they mild, moderate or severe?" ?    Moderate/severe ?Patient is concerned about what to do- she is very anxious- patient was doing so good on this medication. ? ?Protocols used: Medication Question Call-A-AH ? ?

## 2021-08-14 NOTE — Telephone Encounter (Signed)
Can we please see if pharmacy can change back to previous manufacturer? ?

## 2021-08-17 NOTE — Telephone Encounter (Signed)
Spoke with Cecilie Lowers from [facility] in regards to patient's request. Cecilie Lowers, then transferred me to speak with the pharmacist. She informed me the only way she would know what was the previous. Patient would have to have a old pill bottle with the name on it. She informed me that they patient spoke with a different pharmacist this past weekend and she was informed of a different brand that was ordered for the patient, that should be arriving today. Called patient and left a message to clarify that she called and provided pharmacy with her previous manufacturer of her prescription for Seroquel.  ? ?OK for nurse triage/PEC to get clarification from patient, if she calls back.  ?

## 2021-09-03 ENCOUNTER — Ambulatory Visit: Payer: Self-pay | Admitting: Internal Medicine

## 2021-09-03 ENCOUNTER — Encounter: Payer: Self-pay | Admitting: Internal Medicine

## 2021-09-03 VITALS — BP 122/78 | HR 71 | Temp 97.9°F | Ht 62.01 in | Wt 202.6 lb

## 2021-09-03 DIAGNOSIS — R309 Painful micturition, unspecified: Secondary | ICD-10-CM

## 2021-09-03 DIAGNOSIS — N390 Urinary tract infection, site not specified: Secondary | ICD-10-CM

## 2021-09-03 LAB — MICROSCOPIC EXAMINATION
Bacteria, UA: NONE SEEN
Epithelial Cells (non renal): NONE SEEN /hpf (ref 0–10)
WBC, UA: NONE SEEN /hpf (ref 0–5)

## 2021-09-03 LAB — URINALYSIS, ROUTINE W REFLEX MICROSCOPIC
Bilirubin, UA: NEGATIVE
Glucose, UA: NEGATIVE
Ketones, UA: NEGATIVE
Leukocytes,UA: NEGATIVE
Nitrite, UA: POSITIVE — AB
Protein,UA: NEGATIVE
RBC, UA: NEGATIVE
Specific Gravity, UA: 1.025 (ref 1.005–1.030)
Urobilinogen, Ur: 0.2 mg/dL (ref 0.2–1.0)
pH, UA: 6 (ref 5.0–7.5)

## 2021-09-03 MED ORDER — SULFAMETHOXAZOLE-TRIMETHOPRIM 800-160 MG PO TABS: 1.0000 | ORAL_TABLET | Freq: Two times a day (BID) | ORAL | 0 refills | Status: AC

## 2021-09-03 NOTE — Progress Notes (Signed)
? ?BP 122/78   Pulse 71   Temp 97.9 ?F (36.6 ?C) (Oral)   Ht 5' 2.01" (1.575 m)   Wt 202 lb 9.6 oz (91.9 kg)   SpO2 98%   BMI 37.05 kg/m?   ? ?Subjective:  ? ? Patient ID: Jaime Freeman, female    DOB: 1955/06/24, 66 y.o.   MRN: [id number] ? ?Chief Complaint  ?Patient presents with  ?? painful urination  ?  Started last week. Having a lot of pain and pressure, has been taking Azo, Got much worse todoay  ? ? ?HPI: ?[name] is a 66 y.o. female ? ?Urinary Tract Infection  ?This is a new problem. The current episode started in the past 7 days. The problem occurs intermittently. The quality of the pain is described as burning and aching. The pain is at a severity of 6/10. The pain is mild. There has been no fever. Associated symptoms include frequency and urgency. Pertinent negatives include no chills, discharge, hematuria, hesitancy, nausea, possible pregnancy, sweats or vomiting. The treatment provided mild relief.  ? ?Chief Complaint  ?Patient presents with  ?? painful urination  ?  Started last week. Having a lot of pain and pressure, has been taking Azo, Got much worse todoay  ? ? ?Relevant past medical, surgical, family and social history reviewed and updated as indicated. Interim medical history since our last visit reviewed. ?Allergies and medications reviewed and updated. ? ?Review of Systems  ?Constitutional:  Negative for chills.  ?Gastrointestinal:  Negative for nausea and vomiting.  ?Genitourinary:  Positive for frequency and urgency. Negative for hematuria and hesitancy.  ? ?Per HPI unless specifically indicated above ? ?   ?Objective:  ?  ?BP 122/78   Pulse 71   Temp 97.9 ?F (36.6 ?C) (Oral)   Ht 5' 2.01" (1.575 m)   Wt 202 lb 9.6 oz (91.9 kg)   SpO2 98%   BMI 37.05 kg/m?   ?Wt Readings from Last 3 Encounters:  ?09/03/21 202 lb 9.6 oz (91.9 kg)  ?06/24/21 198 lb 3.2 oz (89.9 kg)  ?06/10/21 199 lb (90.3 kg)  ?  ?Physical Exam ?Vitals and nursing note reviewed.  ?Constitutional:   ?    General: She is not in acute distress. ?   Appearance: Normal appearance. She is not ill-appearing or diaphoretic.  ?Pulmonary:  ?   Breath sounds: No rhonchi.  ?Abdominal:  ?   General: Abdomen is flat. Bowel sounds are normal. There is no distension.  ?   Palpations: Abdomen is soft. There is no mass.  ?   Tenderness: There is no abdominal tenderness. There is no guarding.  ?Neurological:  ?   Mental Status: She is alert.  ? ? ?Results for orders placed or performed in visit on 06/10/21  ?Bayer DCA Hb A1c Waived  ?Result Value Ref Range  ? HB A1C (BAYER DCA - WAIVED) 5.6 4.8 - 5.6 %  ?Comprehensive metabolic panel  ?Result Value Ref Range  ? Glucose 97 70 - 99 mg/dL  ? BUN 15 8 - 27 mg/dL  ? Creatinine, Ser 0.63 0.57 - 1.00 mg/dL  ? eGFR 99 >59 mL/min/1.73  ? BUN/Creatinine Ratio 24 12 - 28  ? Sodium 141 134 - 144 mmol/L  ? Potassium 5.2 3.5 - 5.2 mmol/L  ? Chloride 105 96 - 106 mmol/L  ? CO2 25 20 - 29 mmol/L  ? Calcium 9.8 8.7 - 10.3 mg/dL  ? Total Protein 6.5 6.0 - 8.5 g/dL  ?  Albumin 4.4 3.8 - 4.8 g/dL  ? Globulin, Total 2.1 1.5 - 4.5 g/dL  ? Albumin/Globulin Ratio 2.1 1.2 - 2.2  ? Bilirubin Total 0.4 0.0 - 1.2 mg/dL  ? Alkaline Phosphatase 75 44 - 121 IU/L  ? AST 19 0 - 40 IU/L  ? ALT 12 0 - 32 IU/L  ?Lipid Panel w/o Chol/HDL Ratio  ?Result Value Ref Range  ? Cholesterol, Total 186 100 - 199 mg/dL  ? Triglycerides 81 0 - 149 mg/dL  ? HDL 63 >39 mg/dL  ? VLDL Cholesterol Cal 15 5 - 40 mg/dL  ? LDL Chol Calc (NIH) 108 (H) 0 - 99 mg/dL  ?Microalbumin, Urine Waived  ?Result Value Ref Range  ? Microalb, Ur Waived 10 0 - 19 mg/L  ? Creatinine, Urine Waived 100 10 - 300 mg/dL  ? Microalb/Creat Ratio <30 <30 mg/g  ? ?   ? ? ?Current Outpatient Medications:  ??  sulfamethoxazole-trimethoprim (BACTRIM DS) 800-160 MG tablet, Take 1 tablet by mouth 2 (two) times daily for 7 days., Disp: 14 tablet, Rfl: 0 ??  albuterol (PROVENTIL) (2.5 MG/3ML) 0.083% nebulizer solution, Take 3 mLs (2.5 mg total) by nebulization every 6  (six) hours as needed for wheezing or shortness of breath., Disp: 150 mL, Rfl: 1 ??  albuterol (VENTOLIN HFA) 108 (90 Base) MCG/ACT inhaler, Inhale 2 puffs into the lungs every 4 (four) hours as needed for wheezing or shortness of breath., Disp: 1 each, Rfl: 3 ??  Ascorbic Acid (VITAMIN C PO), Take by mouth., Disp: , Rfl:  ??  aspirin EC 81 MG tablet, Take 81 mg by mouth daily., Disp: , Rfl:  ??  benazepril (LOTENSIN) 20 MG tablet, Take 1 tablet (20 mg total) by mouth daily., Disp: 90 tablet, Rfl: 1 ??  bisacodyl (DULCOLAX) 5 MG EC tablet, Take 5 mg by mouth daily as needed for moderate constipation., Disp: , Rfl:  ??  clonazePAM (KLONOPIN) 0.5 MG tablet, Take 1 tablet (0.5 mg total) by mouth 2 (two) times daily as needed for anxiety., Disp: 20 tablet, Rfl: 0 ??  etodolac (LODINE) 400 MG tablet, Take 1 tablet (400 mg total) by mouth 2 (two) times daily as needed., Disp: 180 tablet, Rfl: 1 ??  Multiple Vitamin (MULTI VITAMIN PO), Take by mouth., Disp: , Rfl:  ??  Multiple Vitamins-Minerals (ZINC PO), Take by mouth., Disp: , Rfl:  ??  nystatin cream (MYCOSTATIN), Apply 1 application topically 2 (two) times daily., Disp: 30 g, Rfl: 1 ??  omeprazole (PRILOSEC) 20 MG capsule, Take 1 capsule (20 mg total) by mouth daily., Disp: 90 capsule, Rfl: 1 ??  QUEtiapine (SEROQUEL) 25 MG tablet, TAKE 1-2 TABLETS DAILY AT BEDTIME. MAY TAKE ADDITIONAL 1/2 TABLET UP TO 3 TIMES DAILY AS NEEDED FOR ANXIETY, Disp: 270 tablet, Rfl: 1 ??  senna (SENOKOT) 8.6 MG tablet, Take 1 tablet by mouth daily. Take 2 tablets at night, Disp: , Rfl:   ? ? ?Assessment & Plan:  ? ?UTI: ?Start pt on bactrim for such  ?check UA.  pt is currently symptomatic for an Urinary tract infection(abd pain, burning etc), will cover with emperic abx, see med module for details.  encouraged to increase water/fluid intake.Signs and symptoms of emergency were discussed with the patient. The risks, benefits and side effects of treatment were discussed with the patient.  The patient verbalized an understanding of plan, and was told to call the clinic/go to the ED if symptoms worsen at any point of time. ? ?Problem List  Items Addressed This Visit   ? ?  ? Other  ? Painful urination - Primary  ? Relevant Orders  ? Urine Culture  ? Urinalysis, Routine w reflex microscopic  ?  ? ?Orders Placed This Encounter  ?Procedures  ?? Urine Culture  ?? Urinalysis, Routine w reflex microscopic  ?  ? ?Meds ordered this encounter  ?Medications  ?? sulfamethoxazole-trimethoprim (BACTRIM DS) 800-160 MG tablet  ?  Sig: Take 1 tablet by mouth 2 (two) times daily for 7 days.  ?  Dispense:  14 tablet  ?  Refill:  0  ?  ? ?Follow up plan: ?No follow-ups on file. ?

## 2021-09-05 LAB — URINE CULTURE

## 2021-09-14 ENCOUNTER — Telehealth: Payer: Self-pay | Admitting: Family Medicine

## 2021-09-14 DIAGNOSIS — F332 Major depressive disorder, recurrent severe without psychotic features: Secondary | ICD-10-CM

## 2021-09-14 NOTE — Telephone Encounter (Signed)
I have not seen patient since February, I'm not sure what this is about.

## 2021-09-14 NOTE — Telephone Encounter (Signed)
Copied from [place] [phone]. Topic: General - Other >> Sep 14, 2021 11:01 AM Yvette Rack wrote: Reason for CRM: Pt reports that the manufacturer for QUEtiapine (SEROQUEL) 25 MG tablet is [employer].

## 2021-09-14 NOTE — Telephone Encounter (Signed)
Dr. Wynetta Emery, were you asking the patient to find out this information?

## 2021-09-15 MED ORDER — QUETIAPINE FUMARATE 25 MG PO TABS: ORAL_TABLET | ORAL | 1 refills | Status: DC

## 2021-09-15 NOTE — Telephone Encounter (Signed)
Patient use to get her medication filled at a pharmacy that has closed and was a certain manufacturer. When patient got medication filled at [facility], medication looked different and decided to take it either way. Patient states she had several panic attacks and increased anxiety, so she was switched back to original manufacturer. Patient has been on medication for about a month and is doing better. No further concerns or questions, patient states she is doing good.

## 2021-09-15 NOTE — Telephone Encounter (Signed)
Notes on her medication list

## 2021-10-31 ENCOUNTER — Other Ambulatory Visit: Payer: Self-pay | Admitting: Family Medicine

## 2021-11-02 NOTE — Telephone Encounter (Signed)
Refilled 07/28/21 #90 with 1 refill. Requested Prescriptions  Refused Prescriptions Disp Refills  . omeprazole (PRILOSEC) 20 MG capsule [Pharmacy Med Name: OMEPRAZOLE 20 MG CAP[*]] 90 capsule 1    Sig: TAKE ONE CAPSULE BY MOUTH ONE TIME DAILY     Gastroenterology: Proton Pump Inhibitors Passed - 10/31/2021  2:21 AM      Passed - Valid encounter within last 12 months    Recent Outpatient Visits          2 months ago Painful urination   [place] Vigg, Avanti, MD   4 months ago Rash of body   [place], Santiago Glad, NP   4 months ago IFG (impaired fasting glucose)   [facility] [place], Megan P, DO   1 year ago Benign paroxysmal positional vertigo of left ear   [place], DO   1 year ago Achilles tendinitis of right lower extremity   [place], Robbins, DO      Future Appointments            In 1 month Johnson, Barb Merino, DO [place], [facility]

## 2021-11-30 ENCOUNTER — Other Ambulatory Visit: Payer: Self-pay | Admitting: Family Medicine

## 2021-11-30 DIAGNOSIS — F332 Major depressive disorder, recurrent severe without psychotic features: Secondary | ICD-10-CM

## 2021-12-01 NOTE — Telephone Encounter (Signed)
Requested medication (s) are due for refill today: no, request may be too soon, last refill 09/15/21 for 90 and 1 refill.  Requested medication (s) are on the active medication list:yes  Last refill:  09/15/21  Future visit scheduled:yes  Notes to clinic:  Unable to refill per protocol, cannot delegate.      Requested Prescriptions  Pending Prescriptions Disp Refills   QUEtiapine (SEROQUEL) 25 MG tablet [Pharmacy Med Name: QUETIAPINE FUMARATE 25 MG TAB] 270 tablet 1    Sig: TAKE ONE TO TWO TABLETS BY MOUTH DAILY AT BEDTIME. MAY TAKE ADDITIONAL ONE-HALF TABLET UP TO THREE TIMES DAILY AS NEEDED FOR ANXIETY     Not Delegated - Psychiatry:  Antipsychotics - Second Generation (Atypical) - quetiapine Failed - 11/30/2021  2:17 AM      Failed - This refill cannot be delegated      Failed - TSH in normal range and within 360 days    TSH  Date Value Ref Range Status  02/08/2019 0.470 0.450 - 4.500 uIU/mL Final         Failed - Lipid Panel in normal range within the last 12 months    Cholesterol, Total  Date Value Ref Range Status  06/10/2021 186 100 - 199 mg/dL Final   Cholesterol Piccolo, Waived  Date Value Ref Range Status  10/13/2015 214 (H) <200 mg/dL Final    Comment:                            Desirable                <200                         Borderline High      200- 239                         High                     >239    LDL Chol Calc (NIH)  Date Value Ref Range Status  06/10/2021 108 (H) 0 - 99 mg/dL Final   HDL  Date Value Ref Range Status  06/10/2021 63 >39 mg/dL Final   Triglycerides  Date Value Ref Range Status  06/10/2021 81 0 - 149 mg/dL Final   Triglycerides Piccolo,Waived  Date Value Ref Range Status  10/13/2015 154 (H) <150 mg/dL Final    Comment:                            Normal                   <150                         Borderline High     150 - 199                         High                200 - 499                         Very High                 >  499          Failed - CBC within normal limits and completed in the last 12 months    WBC  Date Value Ref Range Status  02/08/2019 9.0 3.4 - 10.8 x10E3/uL Final  12/07/2017 9.8 3.6 - 11.0 K/uL Final   RBC  Date Value Ref Range Status  02/08/2019 3.96 3.77 - 5.28 x10E6/uL Final  12/07/2017 3.90 3.80 - 5.20 MIL/uL Final   Hemoglobin  Date Value Ref Range Status  02/08/2019 12.8 11.1 - 15.9 g/dL Final   Hematocrit  Date Value Ref Range Status  02/08/2019 36.2 34.0 - 46.6 % Final   MCHC  Date Value Ref Range Status  02/08/2019 35.4 31.5 - 35.7 g/dL Final  12/07/2017 33.9 32.0 - 36.0 g/dL Final   [facility]  Date Value Ref Range Status  02/08/2019 32.3 26.6 - 33.0 pg Final  12/07/2017 32.4 26.0 - 34.0 pg Final   MCV  Date Value Ref Range Status  02/08/2019 91 79 - 97 fL Final   No results found for: "PLTCOUNTKUC", "LABPLAT", "POCPLA" RDW  Date Value Ref Range Status  02/08/2019 11.6 (L) 11.7 - 15.4 % Final         Passed - Completed PHQ-2 or PHQ-9 in the last 360 days      Passed - Last BP in normal range    BP Readings from Last 1 Encounters:  09/03/21 122/78         Passed - Last Heart Rate in normal range    Pulse Readings from Last 1 Encounters:  09/03/21 71         Passed - Valid encounter within last 6 months    Recent Outpatient Visits           2 months ago Painful urination   [place] Vigg, Avanti, MD   5 months ago Rash of body   [place], Santiago Glad, NP   5 months ago IFG (impaired fasting glucose)   [facility] [place], Megan P, DO   1 year ago Benign paroxysmal positional vertigo of left ear   [place], Megan P, DO   1 year ago Achilles tendinitis of right lower extremity   [facility] [place], Megan P, DO       Future Appointments             In 1 week Johnson, Megan P, DO [place], [facility]            Passed - CMP  within normal limits and completed in the last 12 months    Albumin  Date Value Ref Range Status  06/10/2021 4.4 3.8 - 4.8 g/dL Final   Alkaline Phosphatase  Date Value Ref Range Status  06/10/2021 75 44 - 121 IU/L Final   ALT  Date Value Ref Range Status  06/10/2021 12 0 - 32 IU/L Final   AST  Date Value Ref Range Status  06/10/2021 19 0 - 40 IU/L Final   BUN  Date Value Ref Range Status  06/10/2021 15 8 - 27 mg/dL Final   Calcium  Date Value Ref Range Status  06/10/2021 9.8 8.7 - 10.3 mg/dL Final   CO2  Date Value Ref Range Status  06/10/2021 25 20 - 29 mmol/L Final   Creatinine, Ser  Date Value Ref Range Status  06/10/2021 0.63 0.57 - 1.00 mg/dL Final   Glucose  Date Value Ref Range Status  06/10/2021 97 70 -  99 mg/dL Final   Glucose, Bld  Date Value Ref Range Status  12/07/2017 105 (H) 70 - 99 mg/dL Final   Glucose-Capillary  Date Value Ref Range Status  10/18/2017 102 (H) 70 - 99 mg/dL Final   Potassium  Date Value Ref Range Status  06/10/2021 5.2 3.5 - 5.2 mmol/L Final   Sodium  Date Value Ref Range Status  06/10/2021 141 134 - 144 mmol/L Final   Bilirubin Total  Date Value Ref Range Status  06/10/2021 0.4 0.0 - 1.2 mg/dL Final   Protein, ur  Date Value Ref Range Status  12/07/2017 NEGATIVE NEGATIVE mg/dL Final   Protein,UA  Date Value Ref Range Status  09/03/2021 Negative Negative/Trace Final   Total Protein  Date Value Ref Range Status  06/10/2021 6.5 6.0 - 8.5 g/dL Final   GFR calc Af Amer  Date Value Ref Range Status  05/27/2020 107 >59 mL/min/1.73 Final    Comment:    **In accordance with recommendations from the NKF-ASN Task force,**   [facility] is in the process of updating its eGFR calculation to the   2021 CKD-EPI creatinine equation that estimates kidney function   without a race variable.    eGFR  Date Value Ref Range Status  06/10/2021 99 >59 mL/min/1.73 Final   GFR calc non Af Amer  Date Value Ref Range Status   05/27/2020 93 >59 mL/min/1.73 Final

## 2021-12-02 ENCOUNTER — Other Ambulatory Visit: Payer: Self-pay | Admitting: Family Medicine

## 2021-12-02 DIAGNOSIS — F332 Major depressive disorder, recurrent severe without psychotic features: Secondary | ICD-10-CM

## 2021-12-02 NOTE — Telephone Encounter (Signed)
Requested medication (s) are due for refill today: no  Requested medication (s) are on the active medication list: yes  Last refill:  09/15/21 #270/1  Future visit scheduled: yes  Notes to clinic:  Unable to refill per protocol, cannot delegate.    Requested Prescriptions  Pending Prescriptions Disp Refills   QUEtiapine (SEROQUEL) 25 MG tablet [Pharmacy Med Name: QUETIAPINE FUMARATE 25 MG TAB] 270 tablet 1    Sig: TAKE ONE TO TWO TABLETS BY MOUTH DAILY AT BEDTIME. MAY TAKE ADDITIONAL ONE-HALF TABLET UP TO THREE TIMES DAILY AS NEEDED FOR ANXIETY     Not Delegated - Psychiatry:  Antipsychotics - Second Generation (Atypical) - quetiapine Failed - 12/02/2021  4:12 PM      Failed - This refill cannot be delegated      Failed - TSH in normal range and within 360 days    TSH  Date Value Ref Range Status  02/08/2019 0.470 0.450 - 4.500 uIU/mL Final         Failed - Lipid Panel in normal range within the last 12 months    Cholesterol, Total  Date Value Ref Range Status  06/10/2021 186 100 - 199 mg/dL Final   Cholesterol Piccolo, Waived  Date Value Ref Range Status  10/13/2015 214 (H) <200 mg/dL Final    Comment:                            Desirable                <200                         Borderline High      200- 239                         High                     >239    LDL Chol Calc (NIH)  Date Value Ref Range Status  06/10/2021 108 (H) 0 - 99 mg/dL Final   HDL  Date Value Ref Range Status  06/10/2021 63 >39 mg/dL Final   Triglycerides  Date Value Ref Range Status  06/10/2021 81 0 - 149 mg/dL Final   Triglycerides Piccolo,Waived  Date Value Ref Range Status  10/13/2015 154 (H) <150 mg/dL Final    Comment:                            Normal                   <150                         Borderline High     150 - 199                         High                200 - 499                         Very High                >499          Failed -  CBC within normal  limits and completed in the last 12 months    WBC  Date Value Ref Range Status  02/08/2019 9.0 3.4 - 10.8 x10E3/uL Final  12/07/2017 9.8 3.6 - 11.0 K/uL Final   RBC  Date Value Ref Range Status  02/08/2019 3.96 3.77 - 5.28 x10E6/uL Final  12/07/2017 3.90 3.80 - 5.20 MIL/uL Final   Hemoglobin  Date Value Ref Range Status  02/08/2019 12.8 11.1 - 15.9 g/dL Final   Hematocrit  Date Value Ref Range Status  02/08/2019 36.2 34.0 - 46.6 % Final   MCHC  Date Value Ref Range Status  02/08/2019 35.4 31.5 - 35.7 g/dL Final  12/07/2017 33.9 32.0 - 36.0 g/dL Final   [facility]  Date Value Ref Range Status  02/08/2019 32.3 26.6 - 33.0 pg Final  12/07/2017 32.4 26.0 - 34.0 pg Final   MCV  Date Value Ref Range Status  02/08/2019 91 79 - 97 fL Final   No results found for: "PLTCOUNTKUC", "LABPLAT", "POCPLA" RDW  Date Value Ref Range Status  02/08/2019 11.6 (L) 11.7 - 15.4 % Final         Passed - Completed PHQ-2 or PHQ-9 in the last 360 days      Passed - Last BP in normal range    BP Readings from Last 1 Encounters:  09/03/21 122/78         Passed - Last Heart Rate in normal range    Pulse Readings from Last 1 Encounters:  09/03/21 71         Passed - Valid encounter within last 6 months    Recent Outpatient Visits           3 months ago Painful urination   [place], Avanti, MD   5 months ago Rash of body   [place], Santiago Glad, NP   5 months ago IFG (impaired fasting glucose)   [facility] [place], Megan P, DO   1 year ago Benign paroxysmal positional vertigo of left ear   [facility] [place], Megan P, DO   1 year ago Achilles tendinitis of right lower extremity   [facility] [place], Barb Merino, DO       Future Appointments             In 6 days Wyn[place] Emery, Megan P, DO [place], [facility]            Passed - CMP within normal limits and completed in the last 12 months     Albumin  Date Value Ref Range Status  06/10/2021 4.4 3.8 - 4.8 g/dL Final   Alkaline Phosphatase  Date Value Ref Range Status  06/10/2021 75 44 - 121 IU/L Final   ALT  Date Value Ref Range Status  06/10/2021 12 0 - 32 IU/L Final   AST  Date Value Ref Range Status  06/10/2021 19 0 - 40 IU/L Final   BUN  Date Value Ref Range Status  06/10/2021 15 8 - 27 mg/dL Final   Calcium  Date Value Ref Range Status  06/10/2021 9.8 8.7 - 10.3 mg/dL Final   CO2  Date Value Ref Range Status  06/10/2021 25 20 - 29 mmol/L Final   Creatinine, Ser  Date Value Ref Range Status  06/10/2021 0.63 0.57 - 1.00 mg/dL Final   Glucose  Date Value Ref Range Status  06/10/2021 97 70 - 99 mg/dL Final   Glucose, Bld  Date Value Ref  Range Status  12/07/2017 105 (H) 70 - 99 mg/dL Final   Glucose-Capillary  Date Value Ref Range Status  10/18/2017 102 (H) 70 - 99 mg/dL Final   Potassium  Date Value Ref Range Status  06/10/2021 5.2 3.5 - 5.2 mmol/L Final   Sodium  Date Value Ref Range Status  06/10/2021 141 134 - 144 mmol/L Final   Bilirubin Total  Date Value Ref Range Status  06/10/2021 0.4 0.0 - 1.2 mg/dL Final   Protein, ur  Date Value Ref Range Status  12/07/2017 NEGATIVE NEGATIVE mg/dL Final   Protein,UA  Date Value Ref Range Status  09/03/2021 Negative Negative/Trace Final   Total Protein  Date Value Ref Range Status  06/10/2021 6.5 6.0 - 8.5 g/dL Final   GFR calc Af Amer  Date Value Ref Range Status  05/27/2020 107 >59 mL/min/1.73 Final    Comment:    **In accordance with recommendations from the NKF-ASN Task force,**   [facility] is in the process of updating its eGFR calculation to the   2021 CKD-EPI creatinine equation that estimates kidney function   without a race variable.    eGFR  Date Value Ref Range Status  06/10/2021 99 >59 mL/min/1.73 Final   GFR calc non Af Amer  Date Value Ref Range Status  05/27/2020 93 >59 mL/min/1.73 Final         Signed  Prescriptions Disp Refills   benazepril (LOTENSIN) 20 MG tablet 90 tablet 1    Sig: TAKE ONE TABLET BY MOUTH DAILY     Cardiovascular:  ACE Inhibitors Passed - 12/02/2021  4:12 PM      Passed - Cr in normal range and within 180 days    Creatinine, Ser  Date Value Ref Range Status  06/10/2021 0.63 0.57 - 1.00 mg/dL Final         Passed - K in normal range and within 180 days    Potassium  Date Value Ref Range Status  06/10/2021 5.2 3.5 - 5.2 mmol/L Final         Passed - Patient is not pregnant      Passed - Last BP in normal range    BP Readings from Last 1 Encounters:  09/03/21 122/78         Passed - Valid encounter within last 6 months    Recent Outpatient Visits           3 months ago Painful urination   [place] Vigg, Avanti, MD   5 months ago Rash of body   [place], Santiago Glad, NP   5 months ago IFG (impaired fasting glucose)   [facility] [place], Megan P, DO   1 year ago Benign paroxysmal positional vertigo of left ear   [place], Megan P, DO   1 year ago Achilles tendinitis of right lower extremity   [facility] [place], Megan P, DO       Future Appointments             In 6 days [place], Megan P, DO [place], [facility]             omeprazole (PRILOSEC) 20 MG capsule 90 capsule 1    Sig: TAKE ONE CAPSULE BY MOUTH ONE TIME DAILY     Gastroenterology: Proton Pump Inhibitors Passed - 12/02/2021  4:12 PM      Passed - Valid encounter within last 12 months    Recent Outpatient Visits  3 months ago Painful urination   [place], MD   5 months ago Rash of body   [place], Santiago Glad, NP   5 months ago IFG (impaired fasting glucose)   [facility] [place], Megan P, DO   1 year ago Benign paroxysmal positional vertigo of left ear   [place], DO   1 year  ago Achilles tendinitis of right lower extremity   [place], [place], DO       Future Appointments             In 6 days Wynetta Emery, Barb Merino, DO [facility], [facility]

## 2021-12-02 NOTE — Telephone Encounter (Signed)
Requested Prescriptions  Pending Prescriptions Disp Refills  . QUEtiapine (SEROQUEL) 25 MG tablet [Pharmacy Med Name: QUETIAPINE FUMARATE 25 MG TAB] 270 tablet 1    Sig: TAKE ONE TO TWO TABLETS BY MOUTH DAILY AT BEDTIME. MAY TAKE ADDITIONAL ONE-HALF TABLET UP TO THREE TIMES DAILY AS NEEDED FOR ANXIETY     Not Delegated - Psychiatry:  Antipsychotics - Second Generation (Atypical) - quetiapine Failed - 12/02/2021  4:12 PM      Failed - This refill cannot be delegated      Failed - TSH in normal range and within 360 days    TSH  Date Value Ref Range Status  02/08/2019 0.470 0.450 - 4.500 uIU/mL Final         Failed - Lipid Panel in normal range within the last 12 months    Cholesterol, Total  Date Value Ref Range Status  06/10/2021 186 100 - 199 mg/dL Final   Cholesterol Piccolo, Waived  Date Value Ref Range Status  10/13/2015 214 (H) <200 mg/dL Final    Comment:                            Desirable                <200                         Borderline High      200- 239                         High                     >239    LDL Chol Calc (NIH)  Date Value Ref Range Status  06/10/2021 108 (H) 0 - 99 mg/dL Final   HDL  Date Value Ref Range Status  06/10/2021 63 >39 mg/dL Final   Triglycerides  Date Value Ref Range Status  06/10/2021 81 0 - 149 mg/dL Final   Triglycerides Piccolo,Waived  Date Value Ref Range Status  10/13/2015 154 (H) <150 mg/dL Final    Comment:                            Normal                   <150                         Borderline High     150 - 199                         High                200 - 499                         Very High                >499          Failed - CBC within normal limits and completed in the last 12 months    WBC  Date Value Ref Range Status  02/08/2019 9.0 3.4 - 10.8 x10E3/uL Final  12/07/2017 9.8 3.6 - 11.0 K/uL Final   RBC  Date Value Ref Range  Status  02/08/2019 3.96 3.77 - 5.28 x10E6/uL Final  12/07/2017  3.90 3.80 - 5.20 MIL/uL Final   Hemoglobin  Date Value Ref Range Status  02/08/2019 12.8 11.1 - 15.9 g/dL Final   Hematocrit  Date Value Ref Range Status  02/08/2019 36.2 34.0 - 46.6 % Final   MCHC  Date Value Ref Range Status  02/08/2019 35.4 31.5 - 35.7 g/dL Final  12/07/2017 33.9 32.0 - 36.0 g/dL Final   [facility]  Date Value Ref Range Status  02/08/2019 32.3 26.6 - 33.0 pg Final  12/07/2017 32.4 26.0 - 34.0 pg Final   MCV  Date Value Ref Range Status  02/08/2019 91 79 - 97 fL Final   No results found for: "PLTCOUNTKUC", "LABPLAT", "POCPLA" RDW  Date Value Ref Range Status  02/08/2019 11.6 (L) 11.7 - 15.4 % Final         Passed - Completed PHQ-2 or PHQ-9 in the last 360 days      Passed - Last BP in normal range    BP Readings from Last 1 Encounters:  09/03/21 122/78         Passed - Last Heart Rate in normal range    Pulse Readings from Last 1 Encounters:  09/03/21 71         Passed - Valid encounter within last 6 months    Recent Outpatient Visits          3 months ago Painful urination   [place] Vigg, Avanti, MD   5 months ago Rash of body   [place], Santiago Glad, NP   5 months ago IFG (impaired fasting glucose)   [facility] [place], Megan P, DO   1 year ago Benign paroxysmal positional vertigo of left ear   [facility] [place], Megan P, DO   1 year ago Achilles tendinitis of right lower extremity   [facility] [place], Megan P, DO      Future Appointments            In 6 days Johnson, Megan P, DO [place], [facility]           Passed - CMP within normal limits and completed in the last 12 months    Albumin  Date Value Ref Range Status  06/10/2021 4.4 3.8 - 4.8 g/dL Final   Alkaline Phosphatase  Date Value Ref Range Status  06/10/2021 75 44 - 121 IU/L Final   ALT  Date Value Ref Range Status  06/10/2021 12 0 - 32 IU/L Final   AST  Date Value Ref Range  Status  06/10/2021 19 0 - 40 IU/L Final   BUN  Date Value Ref Range Status  06/10/2021 15 8 - 27 mg/dL Final   Calcium  Date Value Ref Range Status  06/10/2021 9.8 8.7 - 10.3 mg/dL Final   CO2  Date Value Ref Range Status  06/10/2021 25 20 - 29 mmol/L Final   Creatinine, Ser  Date Value Ref Range Status  06/10/2021 0.63 0.57 - 1.00 mg/dL Final   Glucose  Date Value Ref Range Status  06/10/2021 97 70 - 99 mg/dL Final   Glucose, Bld  Date Value Ref Range Status  12/07/2017 105 (H) 70 - 99 mg/dL Final   Glucose-Capillary  Date Value Ref Range Status  10/18/2017 102 (H) 70 - 99 mg/dL Final   Potassium  Date Value Ref Range Status  06/10/2021 5.2 3.5 - 5.2 mmol/L Final   Sodium  Date Value Ref Range Status  06/10/2021 141 134 - 144 mmol/L Final   Bilirubin Total  Date Value Ref Range Status  06/10/2021 0.4 0.0 - 1.2 mg/dL Final   Protein, ur  Date Value Ref Range Status  12/07/2017 NEGATIVE NEGATIVE mg/dL Final   Protein,UA  Date Value Ref Range Status  09/03/2021 Negative Negative/Trace Final   Total Protein  Date Value Ref Range Status  06/10/2021 6.5 6.0 - 8.5 g/dL Final   GFR calc Af Amer  Date Value Ref Range Status  05/27/2020 107 >59 mL/min/1.73 Final    Comment:    **In accordance with recommendations from the NKF-ASN Task force,**   [facility] is in the process of updating its eGFR calculation to the   2021 CKD-EPI creatinine equation that estimates kidney function   without a race variable.    eGFR  Date Value Ref Range Status  06/10/2021 99 >59 mL/min/1.73 Final   GFR calc non Af Amer  Date Value Ref Range Status  05/27/2020 93 >59 mL/min/1.73 Final         . benazepril (LOTENSIN) 20 MG tablet [Pharmacy Med Name: BENAZEPRIL 20 MG TAB[*]] 90 tablet 1    Sig: TAKE ONE TABLET BY MOUTH DAILY     Cardiovascular:  ACE Inhibitors Passed - 12/02/2021  4:12 PM      Passed - Cr in normal range and within 180 days    Creatinine, Ser  Date Value  Ref Range Status  06/10/2021 0.63 0.57 - 1.00 mg/dL Final         Passed - K in normal range and within 180 days    Potassium  Date Value Ref Range Status  06/10/2021 5.2 3.5 - 5.2 mmol/L Final         Passed - Patient is not pregnant      Passed - Last BP in normal range    BP Readings from Last 1 Encounters:  09/03/21 122/78         Passed - Valid encounter within last 6 months    Recent Outpatient Visits          3 months ago Painful urination   [place] Vigg, Avanti, MD   5 months ago Rash of body   [place], Santiago Glad, NP   5 months ago IFG (impaired fasting glucose)   [facility] [place], Megan P, DO   1 year ago Benign paroxysmal positional vertigo of left ear   [place], DO   1 year ago Achilles tendinitis of right lower extremity   [place], [place], DO      Future Appointments            In 6 days Wynetta Emery, Barb Merino, DO [place], [facility]           . omeprazole (PRILOSEC) 20 MG capsule [Pharmacy Med Name: OMEPRAZOLE 20 MG CAP[*]] 90 capsule 1    Sig: TAKE ONE CAPSULE BY MOUTH ONE TIME DAILY     Gastroenterology: Proton Pump Inhibitors Passed - 12/02/2021  4:12 PM      Passed - Valid encounter within last 12 months    Recent Outpatient Visits          3 months ago Painful urination   [place], MD   5 months ago Rash of body   [place], NP   5 months ago IFG (impaired fasting  glucose)   [facility] [place], Dandridge P, DO   1 year ago Benign paroxysmal positional vertigo of left ear   [place], DO   1 year ago Achilles tendinitis of right lower extremity   [place], DO      Future Appointments            In 6 days Wynetta Emery, Barb Merino, DO [facility], [facility]

## 2021-12-04 NOTE — Telephone Encounter (Signed)
Patient has appointment next week. She states she does not have enough to get to her appointment.

## 2021-12-08 ENCOUNTER — Ambulatory Visit (INDEPENDENT_AMBULATORY_CARE_PROVIDER_SITE_OTHER): Payer: Medicare HMO | Admitting: Family Medicine

## 2021-12-08 ENCOUNTER — Encounter: Payer: Self-pay | Admitting: Family Medicine

## 2021-12-08 ENCOUNTER — Other Ambulatory Visit (HOSPITAL_COMMUNITY)
Admission: RE | Admit: 2021-12-08 | Discharge: 2021-12-08 | Disposition: A | Discharge status: Home / Self Care | Payer: Medicare HMO | Source: Ambulatory Visit | Attending: Family Medicine | Admitting: Family Medicine

## 2021-12-08 ENCOUNTER — Ambulatory Visit: Payer: Self-pay | Admitting: Family Medicine

## 2021-12-08 VITALS — BP 129/79 | HR 82 | Temp 98.0°F | Wt 201.5 lb

## 2021-12-08 DIAGNOSIS — Z7189 Other specified counseling: Secondary | ICD-10-CM

## 2021-12-08 DIAGNOSIS — Z01419 Encounter for gynecological examination (general) (routine) without abnormal findings: Secondary | ICD-10-CM | POA: Diagnosis not present

## 2021-12-08 DIAGNOSIS — Z23 Encounter for immunization: Secondary | ICD-10-CM | POA: Diagnosis not present

## 2021-12-08 DIAGNOSIS — I129 Hypertensive chronic kidney disease with stage 1 through stage 4 chronic kidney disease, or unspecified chronic kidney disease: Secondary | ICD-10-CM | POA: Diagnosis not present

## 2021-12-08 DIAGNOSIS — G63 Polyneuropathy in diseases classified elsewhere: Secondary | ICD-10-CM | POA: Diagnosis not present

## 2021-12-08 DIAGNOSIS — Z1151 Encounter for screening for human papillomavirus (HPV): Secondary | ICD-10-CM | POA: Diagnosis not present

## 2021-12-08 DIAGNOSIS — F332 Major depressive disorder, recurrent severe without psychotic features: Secondary | ICD-10-CM

## 2021-12-08 DIAGNOSIS — Z1231 Encounter for screening mammogram for malignant neoplasm of breast: Secondary | ICD-10-CM

## 2021-12-08 DIAGNOSIS — Z13820 Encounter for screening for osteoporosis: Secondary | ICD-10-CM

## 2021-12-08 DIAGNOSIS — E559 Vitamin D deficiency, unspecified: Secondary | ICD-10-CM | POA: Diagnosis not present

## 2021-12-08 DIAGNOSIS — Z131 Encounter for screening for diabetes mellitus: Secondary | ICD-10-CM | POA: Diagnosis not present

## 2021-12-08 DIAGNOSIS — B3731 Acute candidiasis of vulva and vagina: Secondary | ICD-10-CM

## 2021-12-08 DIAGNOSIS — Z0000 Encounter for general adult medical examination without abnormal findings: Secondary | ICD-10-CM

## 2021-12-08 DIAGNOSIS — H259 Unspecified age-related cataract: Secondary | ICD-10-CM

## 2021-12-08 DIAGNOSIS — F419 Anxiety disorder, unspecified: Secondary | ICD-10-CM

## 2021-12-08 DIAGNOSIS — D692 Other nonthrombocytopenic purpura: Secondary | ICD-10-CM

## 2021-12-08 DIAGNOSIS — Z1211 Encounter for screening for malignant neoplasm of colon: Secondary | ICD-10-CM

## 2021-12-08 DIAGNOSIS — R7301 Impaired fasting glucose: Secondary | ICD-10-CM

## 2021-12-08 DIAGNOSIS — H6123 Impacted cerumen, bilateral: Secondary | ICD-10-CM | POA: Diagnosis not present

## 2021-12-08 DIAGNOSIS — D519 Vitamin B12 deficiency anemia, unspecified: Secondary | ICD-10-CM | POA: Diagnosis not present

## 2021-12-08 DIAGNOSIS — Z136 Encounter for screening for cardiovascular disorders: Secondary | ICD-10-CM | POA: Diagnosis not present

## 2021-12-08 DIAGNOSIS — E1169 Type 2 diabetes mellitus with other specified complication: Secondary | ICD-10-CM | POA: Diagnosis not present

## 2021-12-08 DIAGNOSIS — E785 Hyperlipidemia, unspecified: Secondary | ICD-10-CM

## 2021-12-08 DIAGNOSIS — Z124 Encounter for screening for malignant neoplasm of cervix: Secondary | ICD-10-CM

## 2021-12-08 DIAGNOSIS — E538 Deficiency of other specified B group vitamins: Secondary | ICD-10-CM | POA: Diagnosis not present

## 2021-12-08 LAB — URINALYSIS, ROUTINE W REFLEX MICROSCOPIC
Bilirubin, UA: NEGATIVE
Glucose, UA: NEGATIVE
Ketones, UA: NEGATIVE
Leukocytes,UA: NEGATIVE
Nitrite, UA: NEGATIVE
Protein,UA: NEGATIVE
RBC, UA: NEGATIVE
Specific Gravity, UA: >1.03 — ABNORMAL HIGH (ref 1.005–1.030)
Urobilinogen, Ur: 0.2 mg/dL (ref 0.2–1.0)
pH, UA: 5.5 (ref 5.0–7.5)

## 2021-12-08 LAB — MICROALBUMIN, URINE WAIVED
Creatinine, Urine Waived: 100 mg/dL (ref 10–300)
Microalb, Ur Waived: 10 mg/L (ref 0–19)
Microalb/Creat Ratio: <30 mg/g (ref <30)

## 2021-12-08 LAB — BAYER DCA HB A1C WAIVED: HB A1C (BAYER DCA - WAIVED): 5.6 % (ref 4.8–5.6)

## 2021-12-08 MED ORDER — ETODOLAC 400 MG PO TABS
400.0000 mg | ORAL_TABLET | Freq: Two times a day (BID) | ORAL | 1 refills | Status: DC | PRN
Start: 2021-12-08 — End: 2022-08-23

## 2021-12-08 MED ORDER — QUETIAPINE FUMARATE 25 MG PO TABS: ORAL_TABLET | ORAL | 1 refills | Status: DC

## 2021-12-08 MED ORDER — BENAZEPRIL HCL 20 MG PO TABS
20.0000 mg | ORAL_TABLET | Freq: Every day | ORAL | 1 refills | Status: DC
Start: 2021-12-08 — End: 2022-08-23

## 2021-12-08 MED ORDER — OMEPRAZOLE 20 MG PO CPDR: 20.0000 mg | DELAYED_RELEASE_CAPSULE | Freq: Every day | ORAL | 1 refills | Status: DC

## 2021-12-08 MED ORDER — NYSTATIN 100000 UNIT/GM EX OINT: 1.0000 | TOPICAL_OINTMENT | Freq: Two times a day (BID) | CUTANEOUS | 3 refills | Status: DC

## 2021-12-08 NOTE — Assessment & Plan Note (Signed)
Reassured patient. Continue to monitor.  

## 2021-12-08 NOTE — Assessment & Plan Note (Signed)
Rechecking labs today. Await results. Treat as needed.  °

## 2021-12-08 NOTE — Assessment & Plan Note (Signed)
Under good control on current regimen. Continue current regimen. Continue to monitor. Call with any concerns. Refills given.   

## 2021-12-08 NOTE — Progress Notes (Signed)
BP 129/79   Pulse 82   Temp 98 F (36.7 C)   Wt 201 lb 8 oz (91.4 kg)   SpO2 96%   BMI 36.85 kg/m    Subjective:    Patient ID: Jaime Freeman, female    DOB: July 26, 1955, 66 y.o.   MRN: [id number]  HPI: Jaime Freeman is a 66 y.o. female presenting on 12/08/2021 for comprehensive medical examination. Current medical complaints include:  HYPERTENSION / HYPERLIPIDEMIA Satisfied with current treatment? yes Duration of hypertension: chronic BP monitoring frequency: not checking BP medication side effects: no Past BP meds: benazepril Duration of hyperlipidemia: chronic Cholesterol medication side effects: not on anything Cholesterol supplements: none Past cholesterol medications: none Medication compliance: excellent compliance Aspirin: yes Recent stressors: no Recurrent headaches: no Visual changes: no Palpitations: no Dyspnea: no Chest pain: no Lower extremity edema: no Dizzy/lightheaded: no  DEPRESSION Mood status: controlled Satisfied with current treatment?: yes Symptom severity: moderate  Duration of current treatment : chronic Side effects: no Medication compliance: excellent compliance Psychotherapy/counseling: no  Previous psychiatric medications: seroquel,  Depressed mood: yes Anxious mood: yes Anhedonia: no Significant weight loss or gain: no Insomnia: yes, hard to fall asleep  Fatigue: yes Feelings of worthlessness or guilt: no Impaired concentration/indecisiveness: no Suicidal ideations: no Hopelessness: no Crying spells: no    12/08/2021    4:28 PM 09/03/2021    2:37 PM 08/26/2020    1:42 PM 05/23/2020    3:57 PM 02/28/2020    1:44 PM  Depression screen PHQ 2/9  Decreased Interest 0 0 0 0 0  Down, Depressed, Hopeless 0 1 1 0 1  PHQ - 2 Score 0 1 1 0 1  Altered sleeping 0 0 1 0 0  Tired, decreased energy 0 1 1 0 0  Change in appetite 0 0 0 0 0  Feeling bad or failure about yourself  0 0 0 0 0  Trouble concentrating 0 0 0 0 1  Moving slowly  or fidgety/restless 0 0 0 0 0  Suicidal thoughts 0 0 0 0 0  PHQ-9 Score 0 2 3 0 2  Difficult doing work/chores Not difficult at all Not difficult at all Not difficult at all  Not difficult at all   She currently lives with: Alone Menopausal Symptoms: no  Functional Status Survey: Is the patient deaf or have difficulty hearing?: Yes Does the patient have difficulty seeing, even when wearing glasses/contacts?: No Does the patient have difficulty concentrating, remembering, or making decisions?: No Does the patient have difficulty walking or climbing stairs?: Yes Does the patient have difficulty dressing or bathing?: No Does the patient have difficulty doing errands alone such as visiting a doctor's office or shopping?: No     12/08/2021    4:28 PM 12/08/2021    2:18 PM 09/03/2021    2:37 PM 05/23/2020    3:56 PM 02/28/2020    1:51 PM  [place] in the past year? '1 1 1 '$ 0 1  Number falls in past yr: 1 1 0  0  Injury with Fall? 0 1 1  0  Risk for fall due to : History of fall(s) History of fall(s) History of fall(s)    Follow up Falls evaluation completed Falls evaluation completed Falls evaluation completed  Falls evaluation completed    Depression Screen    12/08/2021    4:28 PM 09/03/2021    2:37 PM 08/26/2020    1:42 PM 05/23/2020    3:57  PM 02/28/2020    1:44 PM  Depression screen PHQ 2/9  Decreased Interest 0 0 0 0 0  Down, Depressed, Hopeless 0 1 1 0 1  PHQ - 2 Score 0 1 1 0 1  Altered sleeping 0 0 1 0 0  Tired, decreased energy 0 1 1 0 0  Change in appetite 0 0 0 0 0  Feeling bad or failure about yourself  0 0 0 0 0  Trouble concentrating 0 0 0 0 1  Moving slowly or fidgety/restless 0 0 0 0 0  Suicidal thoughts 0 0 0 0 0  PHQ-9 Score 0 2 3 0 2  Difficult doing work/chores Not difficult at all Not difficult at all Not difficult at all  Not difficult at all    Advanced Directives Does patient have a HCPOA?    no Does patient have a living will or MOST form?   no  Past Medical History:  Past Medical History:  Diagnosis Date   Allergic rhinitis    Anxiety    Bipolar affective disorder ([place])    Depression    Hiatal hernia    Hypertension    IFG (impaired fasting glucose)    Lithium toxicity 11/10/2017   Menopausal state    Morbid obesity (HCC)    OCD (obsessive compulsive disorder)    Panic disorder    Restrictive lung disease    Vitamin B12 deficiency    Vitamin D deficiency disease     Surgical History:  Past Surgical History:  Procedure Laterality Date   CESAREAN SECTION     DILATION AND CURETTAGE OF UTERUS     TONSILLECTOMY      Medications:  Current Outpatient Medications on File Prior to Visit  Medication Sig   albuterol (PROVENTIL) (2.5 MG/3ML) 0.083% nebulizer solution Take 3 mLs (2.5 mg total) by nebulization every 6 (six) hours as needed for wheezing or shortness of breath.   albuterol (VENTOLIN HFA) 108 (90 Base) MCG/ACT inhaler Inhale 2 puffs into the lungs every 4 (four) hours as needed for wheezing or shortness of breath.   Ascorbic Acid (VITAMIN C PO) Take by mouth.   aspirin EC 81 MG tablet Take 81 mg by mouth daily.   bisacodyl (DULCOLAX) 5 MG EC tablet Take 5 mg by mouth daily as needed for moderate constipation.   clonazePAM (KLONOPIN) 0.5 MG tablet Take 1 tablet (0.5 mg total) by mouth 2 (two) times daily as needed for anxiety.   Multiple Vitamin (MULTI VITAMIN PO) Take by mouth.   Multiple Vitamins-Minerals (ZINC PO) Take by mouth.   senna (SENOKOT) 8.6 MG tablet Take 1 tablet by mouth daily. Take 2 tablets at night   No current facility-administered medications on file prior to visit.    Allergies:  Allergies  Allergen Reactions   Codeine Other (See Comments)   Dynacirc [Isradipine]    Guaifenesin & Derivatives    Influenza Vaccines    Other Other (See Comments)   Sudafed [Pseudoephedrine]    Zithromax [Azithromycin]    Prednisone Anxiety    Anxiousness    Social History:  Social History    Socioeconomic History   Marital status: Widowed    Spouse name: Not on file   Number of children: 2   Years of education: Not on file   Highest education level: Not on file  Occupational History   Not on file  Tobacco Use   Smoking status: Former    Types: Cigarettes    Quit  date: 04/27/1987    Years since quitting: 34.6   Smokeless tobacco: Never  Vaping Use   Vaping Use: Never used  Substance and Sexual Activity   Alcohol use: No   Drug use: No   Sexual activity: Not Currently  Other Topics Concern   Not on file  Social History Narrative   Not on file   Social Determinants of Health   Financial Resource Strain: Not on file  Food Insecurity: Food Insecurity Present (11/10/2017)   Hunger Vital Sign    Worried About Running Out of Food in the Last Year: Sometimes true    Ran Out of Food in the Last Year: Never true  Transportation Needs: Unknown (11/10/2017)   [place] - Transportation    Lack of Transportation (Medical): Patient refused    Lack of Transportation (Non-Medical): Patient refused  Physical Activity: Unknown (11/10/2017)   Exercise Vital Sign    Days of Exercise per Week: Patient refused    Minutes of Exercise per Session: Patient refused  Stress: Stress Concern Present (11/10/2017)   [place]    Feeling of Stress : To some extent  Social Connections: Unknown (11/10/2017)   Social Connection and Isolation Panel [NHANES]    Frequency of Communication with Friends and Family: Patient refused    Frequency of Social Gatherings with Friends and Family: Patient refused    Attends Religious Services: Patient refused    Active Member of Clubs or Organizations: Patient refused    Attends Archivist Meetings: Patient refused    Marital Status: Patient refused  Intimate Partner Violence: Unknown (11/10/2017)   Humiliation, Afraid, Rape, and Kick questionnaire    Fear of Current or  Ex-Partner: Patient refused    Emotionally Abused: Patient refused    Physically Abused: Patient refused    Sexually Abused: Patient refused   Social History   Tobacco Use  Smoking Status Former   Types: Cigarettes   Quit date: 04/27/1987   Years since quitting: 34.6  Smokeless Tobacco Never   Social History   Substance and Sexual Activity  Alcohol Use No    Family History:  Family History  Problem Relation Age of Onset   Heart disease Mother    Heart attack Mother    Hypertension Mother    Cancer Father        GI tract   Hypertension Father    Cancer Maternal Grandmother        gallbladder   Heart disease Maternal Grandfather    COPD Neg Hx    Diabetes Neg Hx    Stroke Neg Hx     Past medical history, surgical history, medications, allergies, family history and social history reviewed with patient today and changes made to appropriate areas of the chart.   Review of Systems  Constitutional:  Positive for diaphoresis. Negative for chills, fever, malaise/fatigue and weight loss.  HENT:  Positive for hearing loss. Negative for congestion, ear discharge, ear pain, nosebleeds, sinus pain, sore throat and tinnitus.   Eyes:  Positive for blurred vision (has cataracts). Negative for double vision, photophobia, pain, discharge and redness.  Respiratory: Negative.  Negative for stridor.   Cardiovascular:  Positive for palpitations. Negative for chest pain, orthopnea, claudication, leg swelling and PND.  Gastrointestinal:  Positive for constipation. Negative for abdominal pain, blood in stool, diarrhea, heartburn, melena, nausea and vomiting.  Genitourinary: Negative.   Musculoskeletal: Negative.   Skin: Negative.   Neurological:  Positive for  tingling. Negative for dizziness, tremors, sensory change, speech change, focal weakness, seizures, loss of consciousness, weakness and headaches.  Endo/Heme/Allergies:  Positive for environmental allergies. Negative for polydipsia.  Bruises/bleeds easily.  Psychiatric/Behavioral:  Positive for depression. Negative for hallucinations, memory loss, substance abuse and suicidal ideas. The patient is nervous/anxious. The patient does not have insomnia.    All other ROS negative except what is listed above and in the HPI.      Objective:    BP 129/79   Pulse 82   Temp 98 F (36.7 C)   Wt 201 lb 8 oz (91.4 kg)   SpO2 96%   BMI 36.85 kg/m   Wt Readings from Last 3 Encounters:  12/08/21 201 lb 8 oz (91.4 kg)  09/03/21 202 lb 9.6 oz (91.9 kg)  06/24/21 198 lb 3.2 oz (89.9 kg)    Hearing Screening   '500Hz'$  '1000Hz'$  '2000Hz'$  '4000Hz'$   Right ear Pass Pass Pass Fail  Left ear Pass Fail Pass Pass   Vision Screening   Right eye Left eye Both eyes  Without correction     With correction '20/50 20/40 20/40 '$   Physical Exam Vitals and nursing note reviewed. Exam conducted with a chaperone present.  Constitutional:      General: She is not in acute distress.    Appearance: Normal appearance. She is obese. She is not ill-appearing, toxic-appearing or diaphoretic.  HENT:     Head: Normocephalic and atraumatic.     Right Ear: Ear canal and external ear normal. There is impacted cerumen.     Left Ear: Ear canal and external ear normal. There is impacted cerumen.     Nose: Nose normal. No congestion or rhinorrhea.     Mouth/Throat:     Mouth: Mucous membranes are moist.     Pharynx: Oropharynx is clear. No oropharyngeal exudate or posterior oropharyngeal erythema.  Eyes:     General: No scleral icterus.       Right eye: No discharge.        Left eye: No discharge.     Extraocular Movements: Extraocular movements intact.     Conjunctiva/sclera: Conjunctivae normal.     Pupils: Pupils are equal, round, and reactive to light.  Neck:     Vascular: No carotid bruit.  Cardiovascular:     Rate and Rhythm: Normal rate and regular rhythm.     Pulses: Normal pulses.     Heart sounds: No murmur heard.    No friction rub. No gallop.   Pulmonary:     Effort: Pulmonary effort is normal. No respiratory distress.     Breath sounds: Normal breath sounds. No stridor. No wheezing, rhonchi or rales.  Chest:     Chest wall: No tenderness.  Breasts:    Right: Normal.     Left: Normal.  Abdominal:     General: Abdomen is flat. Bowel sounds are normal. There is no distension.     Palpations: Abdomen is soft. There is no mass.     Tenderness: There is no abdominal tenderness. There is no right CVA tenderness, left CVA tenderness, guarding or rebound.     Hernia: No hernia is present.  Genitourinary:    Labia:        Right: Rash present. No tenderness, lesion or injury.        Left: Rash present. No tenderness, lesion or injury.      Urethra: No prolapse, urethral pain, urethral swelling or urethral lesion.     Vagina:  Normal.     Cervix: Normal.     Uterus: Normal.      Adnexa: Right adnexa normal and left adnexa normal.     Comments: Difficult to perform due to discomfort Musculoskeletal:        General: No swelling, tenderness, deformity or signs of injury.     Cervical back: Normal range of motion and neck supple. No rigidity. No muscular tenderness.     Right lower leg: No edema.     Left lower leg: No edema.  Lymphadenopathy:     Cervical: No cervical adenopathy.  Skin:    General: Skin is warm and dry.     Capillary Refill: Capillary refill takes less than 2 seconds.     Coloration: Skin is not jaundiced or pale.     Findings: No bruising, erythema, lesion or rash.  Neurological:     General: No focal deficit present.     Mental Status: She is alert and oriented to person, place, and time. Mental status is at baseline.     Cranial Nerves: No cranial nerve deficit.     Sensory: No sensory deficit.     Motor: No weakness.     Coordination: Coordination normal.     Gait: Gait normal.     Deep Tendon Reflexes: Reflexes normal.  Psychiatric:        Mood and Affect: Mood normal.        Behavior: Behavior normal.         Thought Content: Thought content normal.        Judgment: Judgment normal.        12/08/2021    3:31 PM  6CIT Screen  What Year? 0 points  What month? 0 points  What time? 0 points  Count back from 20 0 points  Months in reverse 2 points  Repeat phrase 4 points  Total Score 6 points    Results for orders placed or performed in visit on 09/03/21  Urine Culture   Specimen: Urine   UR  Result Value Ref Range   Urine Culture, Routine Final report    Organism ID, Bacteria Comment   Microscopic Examination   Urine  Result Value Ref Range   WBC, UA None seen 0 - 5 /hpf   RBC, Urine 0-3 0 - 2 /hpf   Epithelial Cells (non renal) None seen 0 - 10 /hpf   Bacteria, UA None seen None seen/Few  Urinalysis, Routine w reflex microscopic  Result Value Ref Range   Specific Gravity, UA 1.025 1.005 - 1.030   pH, UA 6.0 5.0 - 7.5   Color, UA Yellow Yellow   Appearance Ur Clear Clear   Leukocytes,UA Negative Negative   Protein,UA Negative Negative/Trace   Glucose, UA Negative Negative   Ketones, UA Negative Negative   RBC, UA Negative Negative   Bilirubin, UA Negative Negative   Urobilinogen, Ur 0.2 0.2 - 1.0 mg/dL   Nitrite, UA Positive (A) Negative   Microscopic Examination See below:       Assessment & Plan:   Problem List Items Addressed This Visit       Cardiovascular and Mediastinum   Senile purpura ([place])    Reassured patient. Continue to monitor.       Relevant Medications   benazepril (LOTENSIN) 20 MG tablet     Endocrine   IFG (impaired fasting glucose)    Rechecking labs today. Await results. Treat as needed.  Nervous and Auditory   Polyneuropathy associated with underlying disease ([place])    Stable on vitamin D. Rechecking labs today. Await results. Treat as needed.       Relevant Medications   QUEtiapine (SEROQUEL) 25 MG tablet     Genitourinary   Benign hypertensive renal disease   Relevant Orders   CBC with Differential/Platelet    Comprehensive metabolic panel   Urinalysis, Routine w reflex microscopic   TSH   Microalbumin, Urine Waived     Other   Morbid obesity (HCC)    Due to depression, IFG, fatty liver, HLD and HTN. Encouraged diet and exercise with goal of losing 1-2lbs per week. Call with any concerns.       Vitamin D deficiency disease    Rechecking labs today. Await results. Treat as needed.       Relevant Orders   VITAMIN D 25 Hydroxy (Vit-D Deficiency, Fractures)   Vitamin B12 deficiency    Rechecking labs today. Await results. Treat as needed.       Relevant Orders   CBC with Differential/Platelet   B12   Depression    Under good control on current regimen. Continue current regimen. Continue to monitor. Call with any concerns. Refills given.        Relevant Medications   QUEtiapine (SEROQUEL) 25 MG tablet   Anxiety    Under good control on current regimen. Continue current regimen. Continue to monitor. Call with any concerns. Refills given.        Dyslipidemia    Rechecking labs today. Await results. Treat as needed.       Advance directive discussed with patient    A voluntary discussion about advance care planning including the explanation and discussion of advance directives was extensively discussed  with the patient for 3 minutes with patient present.  Explanation about the health care proxy and Living will was reviewed and packet with forms with explanation of how to fill them out was given.  During this discussion, the patient was able to identify a health care proxy as her daughter in law and plans to fill out the paperwork required.  Patient was offered a separate [place] visit for further assistance with forms.         Age-related cataract of both eyes    Continue to follow with ophthalmology as needed.       Other Visit Diagnoses     Welcome to Medicare preventive visit    -  Primary   Preventative care discussed today as below.    Relevant Orders    Bayer DCA Hb A1c Waived   Lipid Panel w/o Chol/HDL Ratio   Cytology - PAP   EKG 12-Lead (Completed)   Bilateral hearing loss due to cerumen impaction       Ears flushed today with good results.    Vaginal candida       Will treat with nystatin. Call if not getting better or getting worse.    Relevant Medications   nystatin ointment (MYCOSTATIN)   Screening for cardiovascular condition       EKG done today. Labs drawn today. Await results.    Relevant Orders   Lipid Panel w/o Chol/HDL Ratio   EKG 12-Lead (Completed)   Screening for diabetes mellitus       Labs drawn today. Await results.    Relevant Orders   Bayer DCA Hb A1c Waived   Screening for cervical cancer       Pap  done today.   Relevant Orders   Cytology - PAP   Encounter for screening mammogram for malignant neoplasm of breast       Mammogram ordered today.   Relevant Orders   MM 3D SCREEN BREAST BILATERAL   Screening for osteoporosis       DEXA ordered today.   Relevant Orders   DG Bone Density   Need for pneumococcal 20-valent conjugate vaccination       Prevnar 20 given today.   Relevant Orders   Pneumococcal conjugate vaccine 20-valent (Prevnar 20) (Completed)   Screening for colon cancer       Cologuard ordered today.   Relevant Orders   Cologuard        Preventative Services:  AAA screening: N/A Health Risk Assessment and Personalized Prevention Plan: Done today Bone Mass Measurements: Ordered today- call to schedule Breast Cancer Screening: Ordered today- call to schedule CVD Screening: Done today Cervical Cancer Screening: Done today Colon Cancer Screening: Ordered today Depression Screening: Done today Diabetes Screening: Done today Glaucoma Screening: See your eye doctor Hepatitis B vaccine: N/A Hepatitis C screening: Up to date HIV Screening: Up to date Flu Vaccine: Get in the fall Lung cancer Screening: N/A Obesity Screening: Done today Pneumonia Vaccines (2): Given today STI  Screening: N/A  Follow up plan: Return in about 6 months (around 06/10/2022).   LABORATORY TESTING:  - Pap smear: pap done  IMMUNIZATIONS:   - Tdap: Tetanus vaccination status reviewed: last tetanus booster within 10 years. - Influenza: Allergic - Prevnar: Administered today - Zostavax vaccine: Given elsewhere  SCREENING: -Mammogram: Ordered today  - Colonoscopy: Ordered today  - Bone Density: Ordered today  -Hearing Test: Ordered today   PATIENT COUNSELING:   Advised to take 1 mg of folate supplement per day if capable of pregnancy.   Sexuality: Discussed sexually transmitted diseases, partner selection, use of condoms, avoidance of unintended pregnancy  and contraceptive alternatives.   Advised to avoid cigarette smoking.  I discussed with the patient that most people either abstain from alcohol or drink within safe limits (<=14/week and <=4 drinks/occasion for males, <=7/weeks and <= 3 drinks/occasion for females) and that the risk for alcohol disorders and other health effects rises proportionally with the number of drinks per week and how often a drinker exceeds daily limits.  Discussed cessation/primary prevention of drug use and availability of treatment for abuse.   Diet: Encouraged to adjust caloric intake to maintain  or achieve ideal body weight, to reduce intake of dietary saturated fat and total fat, to limit sodium intake by avoiding high sodium foods and not adding table salt, and to maintain adequate dietary potassium and calcium preferably from fresh fruits, vegetables, and low-fat dairy products.    stressed the importance of regular exercise  Injury prevention: Discussed safety belts, safety helmets, smoke detector, smoking near bedding or upholstery.   Dental health: Discussed importance of regular tooth brushing, flossing, and dental visits.    NEXT PREVENTATIVE PHYSICAL DUE IN 1 YEAR. Return in about 6 months (around 06/10/2022).

## 2021-12-08 NOTE — Assessment & Plan Note (Signed)
Due to depression, IFG, fatty liver, HLD and HTN. Encouraged diet and exercise with goal of losing 1-2lbs per week. Call with any concerns.

## 2021-12-08 NOTE — Assessment & Plan Note (Signed)
A voluntary discussion about advance care planning including the explanation and discussion of advance directives was extensively discussed  with the patient for 3 minutes with patient present.  Explanation about the health care proxy and Living will was reviewed and packet with forms with explanation of how to fill them out was given.  During this discussion, the patient was able to identify a health care proxy as her daughter in law and plans to fill out the paperwork required.  Patient was offered a separate [place] visit for further assistance with forms.

## 2021-12-08 NOTE — Assessment & Plan Note (Signed)
Continue to follow with ophthalmology as needed.

## 2021-12-08 NOTE — Assessment & Plan Note (Signed)
Stable on vitamin D. Rechecking labs today. Await results. Treat as needed.

## 2021-12-08 NOTE — Patient Instructions (Addendum)
Preventative Services:  AAA screening: N/A Health Risk Assessment and Personalized Prevention Plan: Done today Bone Mass Measurements: Ordered today- call to schedule Breast Cancer Screening: Ordered today- call to schedule CVD Screening: Done today Cervical Cancer Screening: Done today Colon Cancer Screening: Ordered today Depression Screening: Done today Diabetes Screening: Done today Glaucoma Screening: See your eye doctor Hepatitis B vaccine: N/A Hepatitis C screening: Up to date HIV Screening: Up to date Flu Vaccine: Get in the fall Lung cancer Screening: N/A Obesity Screening: Done today Pneumonia Vaccines (2): Given today STI Screening: N/A  Please call to schedule your mammogram and bone density: [facility] at [place]: [address], [place], [place] [address] Phone: [phone]  [place] at [facility] [address] [place],  [place]  [address] Phone: [phone]

## 2021-12-09 ENCOUNTER — Ambulatory Visit: Payer: Self-pay

## 2021-12-09 ENCOUNTER — Encounter: Payer: Self-pay | Admitting: Family Medicine

## 2021-12-09 LAB — CBC WITH DIFFERENTIAL/PLATELET
Basophils Absolute: 0 10*3/uL (ref 0.0–0.2)
Basos: 1 %
EOS (ABSOLUTE): 0.1 10*3/uL (ref 0.0–0.4)
Eos: 1 %
Hematocrit: 36.1 % (ref 34.0–46.6)
Hemoglobin: 13 g/dL (ref 11.1–15.9)
Immature Grans (Abs): 0 10*3/uL (ref 0.0–0.1)
Immature Granulocytes: 0 %
Lymphocytes Absolute: 2.2 10*3/uL (ref 0.7–3.1)
Lymphs: 32 %
MCH: 33.9 pg — ABNORMAL HIGH (ref 26.6–33.0)
MCHC: 36 g/dL — ABNORMAL HIGH (ref 31.5–35.7)
MCV: 94 fL (ref 79–97)
Monocytes Absolute: 0.4 10*3/uL (ref 0.1–0.9)
Monocytes: 6 %
Neutrophils Absolute: 4.1 10*3/uL (ref 1.4–7.0)
Neutrophils: 60 %
Platelets: 312 10*3/uL (ref 150–450)
RBC: 3.84 x10E6/uL (ref 3.77–5.28)
RDW: 11.4 % — ABNORMAL LOW (ref 11.7–15.4)
WBC: 6.8 10*3/uL (ref 3.4–10.8)

## 2021-12-09 LAB — COMPREHENSIVE METABOLIC PANEL
ALT: 10 IU/L (ref 0–32)
AST: 17 IU/L (ref 0–40)
Albumin/Globulin Ratio: 2 (ref 1.2–2.2)
Albumin: 4.3 g/dL (ref 3.9–4.9)
Alkaline Phosphatase: 75 IU/L (ref 44–121)
BUN/Creatinine Ratio: 19 (ref 12–28)
BUN: 14 mg/dL (ref 8–27)
Bilirubin Total: 0.2 mg/dL (ref 0.0–1.2)
CO2: 23 mmol/L (ref 20–29)
Calcium: 9.7 mg/dL (ref 8.7–10.3)
Chloride: 107 mmol/L — ABNORMAL HIGH (ref 96–106)
Creatinine, Ser: 0.73 mg/dL (ref 0.57–1.00)
Globulin, Total: 2.2 g/dL (ref 1.5–4.5)
Glucose: 88 mg/dL (ref 70–99)
Potassium: 5 mmol/L (ref 3.5–5.2)
Sodium: 143 mmol/L (ref 134–144)
Total Protein: 6.5 g/dL (ref 6.0–8.5)
eGFR: 91 mL/min/{1.73_m2} (ref >59)

## 2021-12-09 LAB — LIPID PANEL W/O CHOL/HDL RATIO
Cholesterol, Total: 200 mg/dL — ABNORMAL HIGH (ref 100–199)
HDL: 58 mg/dL (ref >39)
LDL Chol Calc (NIH): 120 mg/dL — ABNORMAL HIGH (ref 0–99)
Triglycerides: 125 mg/dL (ref 0–149)
VLDL Cholesterol Cal: 22 mg/dL (ref 5–40)

## 2021-12-09 LAB — VITAMIN D 25 HYDROXY (VIT D DEFICIENCY, FRACTURES): Vit D, 25-Hydroxy: 32.3 ng/mL (ref 30.0–100.0)

## 2021-12-09 LAB — TSH: TSH: 0.455 u[IU]/mL (ref 0.450–4.500)

## 2021-12-09 LAB — VITAMIN B12: Vitamin B-12: 883 pg/mL (ref 232–1245)

## 2021-12-09 NOTE — Telephone Encounter (Signed)
Summary: discuss medication   Pt inquiring where she should apply nystatin ointment (MYCOSTATIN   Please advise      Called pt - unable to [facility] mailbox is full.

## 2021-12-09 NOTE — Telephone Encounter (Signed)
  Chief Complaint: Med Question Symptoms: None Frequency: NA Pertinent Negatives: Patient denies NA Disposition: '[]'$ ED /'[]'$ Urgent Care (no appt availability in office) / '[]'$ Appointment(In office/virtual)/ '[]'$  [place] [facility]/ '[]'$ Home Care/ '[]'$ Refused Recommended Disposition /'[]'$ [place] [facility]/ '[x]'$  Follow-up with PCP Additional Notes: Pt . states at [facility] today Dr. Wynetta Emery ordered Nystatin ointment "Because I was irritated down there but I didn't know and I don't feel anything, no itching or anything." Pt questioning "Where do I put this ointment." Attempted to advise pt., states she would rather hear from Dr. Wynetta Emery. Assured NT would route to practice for PCPs review.  Reason for Disposition  [1] Caller has NON-URGENT medicine question about med that PCP prescribed AND [2] triager unable to answer question  Answer Assessment - Initial Assessment Questions 1. NAME of MEDICINE: "What medicine(s) are you calling about?"     Nystatin 2. QUESTION: "What is your question?" (e.g., double dose of medicine, side effect)     Where should I put it? 3. PRESCRIBER: "Who prescribed the medicine?" Reason: if prescribed by specialist, call should be referred to that group.     PCP  Protocols used: Medication Question Call-A-AH

## 2021-12-10 LAB — CYTOLOGY - PAP
Comment: NEGATIVE
Diagnosis: NEGATIVE
High risk HPV: NEGATIVE

## 2021-12-10 NOTE — Telephone Encounter (Signed)
Returned patient call, patient states she has figured out where cream goes and has no further questions.

## 2021-12-11 ENCOUNTER — Telehealth: Payer: Self-pay

## 2021-12-11 NOTE — Telephone Encounter (Signed)
Pt returning call  Advised pt of message below  Pt verbalized understanding

## 2021-12-11 NOTE — Telephone Encounter (Unsigned)
Copied from [place] [phone]. Topic: General - Other >> Dec 11, 2021 10:31 AM Jaime Freeman wrote: Reason for CRM: The patient has called to check on the status of their previously dropped off handicap placard paperwork dropped off 12/08/21  Please contact the patient further when possible

## 2021-12-11 NOTE — Telephone Encounter (Signed)
Attempted to contact patient NA unable to LVM to advise provider has not yet finished form. Will call when ready to pick up.

## 2021-12-14 DIAGNOSIS — Z1211 Encounter for screening for malignant neoplasm of colon: Secondary | ICD-10-CM | POA: Diagnosis not present

## 2021-12-16 NOTE — Progress Notes (Signed)
Interpreted by me on 12/08/21. NSR at 80bpm, no ST segment changes.

## 2021-12-23 LAB — COLOGUARD: COLOGUARD: NEGATIVE

## 2021-12-29 ENCOUNTER — Ambulatory Visit (INDEPENDENT_AMBULATORY_CARE_PROVIDER_SITE_OTHER): Payer: Medicare HMO | Admitting: Nurse Practitioner

## 2021-12-29 ENCOUNTER — Encounter: Payer: Self-pay | Admitting: Nurse Practitioner

## 2021-12-29 VITALS — BP 130/72 | HR 96 | Temp 98.7°F | Wt 200.0 lb

## 2021-12-29 DIAGNOSIS — J029 Acute pharyngitis, unspecified: Secondary | ICD-10-CM | POA: Diagnosis not present

## 2021-12-29 DIAGNOSIS — J0110 Acute frontal sinusitis, unspecified: Secondary | ICD-10-CM

## 2021-12-29 DIAGNOSIS — F319 Bipolar disorder, unspecified: Secondary | ICD-10-CM | POA: Diagnosis not present

## 2021-12-29 MED ORDER — AMOXICILLIN-POT CLAVULANATE 875-125 MG PO TABS: 1.0000 | ORAL_TABLET | Freq: Two times a day (BID) | ORAL | 0 refills | Status: AC

## 2021-12-29 NOTE — Progress Notes (Signed)
Please let patient know that her strep test was negative.

## 2021-12-29 NOTE — Progress Notes (Signed)
BP 130/72   Pulse 96   Temp 98.7 F (37.1 C) (Oral)   Wt 200 lb (90.7 kg)   SpO2 98%   BMI 36.57 kg/m    Subjective:    Patient ID: Jaime Freeman, female    DOB: 1955/05/31, 66 y.o.   MRN: [id number]  HPI: Jaime Freeman is a 66 y.o. female  Chief Complaint  Patient presents with   Sore Throat    Onset Friday, c/o mucous deep in throat per patient.    UPPER RESPIRATORY TRACT INFECTION Worst symptom: sore throat - symptoms started Friday.  Fever: no Cough: no Shortness of breath: yes- sob and wheezing occurred yesterday but not today Wheezing:yes Chest pain: no Chest tightness: no Chest congestion: yes Nasal congestion: yes Runny nose: yes Post nasal drip: yes Sneezing: no Sore throat: yes Swollen glands: yes Sinus pressure: yes Headache: yes Face pain: yes Toothache: yes Ear pain: no bilateral Ear pressure: no bilateral Eyes red/itching:no Eye drainage/crusting: no  Vomiting: no Rash: no Fatigue: yes Sick contacts: no Strep contacts: no  Context: fluctuating Recurrent sinusitis: no Relief with OTC cold/cough medications: no  Treatments attempted: none   Relevant past medical, surgical, family and social history reviewed and updated as indicated. Interim medical history since our last visit reviewed. Allergies and medications reviewed and updated.  Review of Systems  Constitutional:  Positive for fatigue. Negative for fever.  HENT:  Positive for congestion, sinus pressure, sinus pain and sore throat. Negative for dental problem, ear pain, postnasal drip, rhinorrhea and sneezing.   Respiratory:  Positive for cough. Negative for shortness of breath and wheezing.   Cardiovascular:  Negative for chest pain.  Gastrointestinal:  Negative for vomiting.  Skin:  Negative for rash.  Neurological:  Positive for headaches.    Per HPI unless specifically indicated above     Objective:    BP 130/72   Pulse 96   Temp 98.7 F (37.1 C) (Oral)   Wt 200 lb  (90.7 kg)   SpO2 98%   BMI 36.57 kg/m   Wt Readings from Last 3 Encounters:  12/29/21 200 lb (90.7 kg)  12/08/21 201 lb 8 oz (91.4 kg)  09/03/21 202 lb 9.6 oz (91.9 kg)    Physical Exam Vitals and nursing note reviewed.  Constitutional:      General: She is not in acute distress.    Appearance: Normal appearance. She is normal weight. She is not ill-appearing, toxic-appearing or diaphoretic.  HENT:     Head: Normocephalic.     Right Ear: External ear normal. A middle ear effusion is present.     Left Ear: External ear normal. A middle ear effusion is present.     Nose:     Right Sinus: Frontal sinus tenderness present.     Left Sinus: Frontal sinus tenderness present.     Mouth/Throat:     Mouth: Mucous membranes are moist.     Pharynx: Oropharynx is clear. Posterior oropharyngeal erythema present. No pharyngeal swelling or oropharyngeal exudate.  Eyes:     General:        Right eye: No discharge.        Left eye: No discharge.     Extraocular Movements: Extraocular movements intact.     Conjunctiva/sclera: Conjunctivae normal.     Pupils: Pupils are equal, round, and reactive to light.  Cardiovascular:     Rate and Rhythm: Normal rate and regular rhythm.     Heart sounds: No murmur  heard. Pulmonary:     Effort: Pulmonary effort is normal. No respiratory distress.     Breath sounds: Normal breath sounds. No wheezing or rales.  Musculoskeletal:     Cervical back: Normal range of motion and neck supple.  Skin:    General: Skin is warm and dry.     Capillary Refill: Capillary refill takes less than 2 seconds.  Neurological:     General: No focal deficit present.     Mental Status: She is alert and oriented to person, place, and time. Mental status is at baseline.  Psychiatric:        Mood and Affect: Mood normal.        Behavior: Behavior normal.        Thought Content: Thought content normal.        Judgment: Judgment normal.     Results for orders placed or  performed in visit on 12/08/21  Bayer DCA Hb A1c Waived  Result Value Ref Range   HB A1C (BAYER DCA - WAIVED) 5.6 4.8 - 5.6 %  CBC with Differential/Platelet  Result Value Ref Range   WBC 6.8 3.4 - 10.8 x10E3/uL   RBC 3.84 3.77 - 5.28 x10E6/uL   Hemoglobin 13.0 11.1 - 15.9 g/dL   Hematocrit 36.1 34.0 - 46.6 %   MCV 94 79 - 97 fL   MCH 33.9 (H) 26.6 - 33.0 pg   MCHC 36.0 (H) 31.5 - 35.7 g/dL   RDW 11.4 (L) 11.7 - 15.4 %   Platelets 312 150 - 450 x10E3/uL   Neutrophils 60 Not Estab. %   Lymphs 32 Not Estab. %   Monocytes 6 Not Estab. %   Eos 1 Not Estab. %   Basos 1 Not Estab. %   Neutrophils Absolute 4.1 1.4 - 7.0 x10E3/uL   Lymphocytes Absolute 2.2 0.7 - 3.1 x10E3/uL   Monocytes Absolute 0.4 0.1 - 0.9 x10E3/uL   EOS (ABSOLUTE) 0.1 0.0 - 0.4 x10E3/uL   Basophils Absolute 0.0 0.0 - 0.2 x10E3/uL   Immature Granulocytes 0 Not Estab. %   Immature Grans (Abs) 0.0 0.0 - 0.1 x10E3/uL  Comprehensive metabolic panel  Result Value Ref Range   Glucose 88 70 - 99 mg/dL   BUN 14 8 - 27 mg/dL   Creatinine, Ser 0.73 0.57 - 1.00 mg/dL   eGFR 91 >59 mL/min/1.73   BUN/Creatinine Ratio 19 12 - 28   Sodium 143 134 - 144 mmol/L   Potassium 5.0 3.5 - 5.2 mmol/L   Chloride 107 (H) 96 - 106 mmol/L   CO2 23 20 - 29 mmol/L   Calcium 9.7 8.7 - 10.3 mg/dL   Total Protein 6.5 6.0 - 8.5 g/dL   Albumin 4.3 3.9 - 4.9 g/dL   Globulin, Total 2.2 1.5 - 4.5 g/dL   Albumin/Globulin Ratio 2.0 1.2 - 2.2   Bilirubin Total 0.2 0.0 - 1.2 mg/dL   Alkaline Phosphatase 75 44 - 121 IU/L   AST 17 0 - 40 IU/L   ALT 10 0 - 32 IU/L  Lipid Panel w/o Chol/HDL Ratio  Result Value Ref Range   Cholesterol, Total 200 (H) 100 - 199 mg/dL   Triglycerides 125 0 - 149 mg/dL   HDL 58 >39 mg/dL   VLDL Cholesterol Cal 22 5 - 40 mg/dL   LDL Chol Calc (NIH) 120 (H) 0 - 99 mg/dL  Urinalysis, Routine w reflex microscopic  Result Value Ref Range   Specific Gravity, UA >1.030 (H) 1.005 - 1.030  pH, UA 5.5 5.0 - 7.5   Color, UA  Yellow Yellow   Appearance Ur Clear Clear   Leukocytes,UA Negative Negative   Protein,UA Negative Negative/Trace   Glucose, UA Negative Negative   Ketones, UA Negative Negative   RBC, UA Negative Negative   Bilirubin, UA Negative Negative   Urobilinogen, Ur 0.2 0.2 - 1.0 mg/dL   Nitrite, UA Negative Negative  TSH  Result Value Ref Range   TSH 0.455 0.450 - 4.500 uIU/mL  Microalbumin, Urine Waived  Result Value Ref Range   Microalb, Ur Waived 10 0 - 19 mg/L   Creatinine, Urine Waived 100 10 - 300 mg/dL   Microalb/Creat Ratio <30 <30 mg/g  VITAMIN D 25 Hydroxy (Vit-D Deficiency, Fractures)  Result Value Ref Range   Vit D, 25-Hydroxy 32.3 30.0 - 100.0 ng/mL  B12  Result Value Ref Range   Vitamin B-12 883 232 - 1,245 pg/mL  Cologuard  Result Value Ref Range   COLOGUARD Negative Negative  Cytology - PAP  Result Value Ref Range   High risk HPV Negative    Adequacy      Satisfactory for evaluation. The presence or absence of an   Adequacy      endocervical/transformation zone component cannot be determined because   Adequacy of atrophy.    Diagnosis      - Negative for intraepithelial lesion or malignancy (NILM)   Comment Normal Reference Range HPV - Negative       Assessment & Plan:   Problem List Items Addressed This Visit   None Visit Diagnoses     Acute non-recurrent frontal sinusitis    -  Primary   Will treat with Augmentin.  Complete course of antibiotics.  Strep test neg in office.  Follow up if symptoms do not improve.   Relevant Medications   amoxicillin-clavulanate (AUGMENTIN) 875-125 MG tablet   Sore throat       Relevant Orders   Rapid Strep Screen ([facility] ONLY)        Follow up plan: Return if symptoms worsen or fail to improve.

## 2022-01-01 LAB — RAPID STREP SCREEN (MED CTR MEBANE ONLY): Strep Gp A Ag, IA W/Reflex: NEGATIVE

## 2022-01-01 LAB — CULTURE, GROUP A STREP: Strep A Culture: NEGATIVE

## 2022-01-19 ENCOUNTER — Ambulatory Visit (INDEPENDENT_AMBULATORY_CARE_PROVIDER_SITE_OTHER): Payer: Medicare HMO | Admitting: Family Medicine

## 2022-01-19 ENCOUNTER — Encounter: Payer: Self-pay | Admitting: Family Medicine

## 2022-01-19 VITALS — BP 131/81 | HR 75 | Temp 98.1°F | Wt 198.2 lb

## 2022-01-19 DIAGNOSIS — L509 Urticaria, unspecified: Secondary | ICD-10-CM

## 2022-01-19 MED ORDER — TRIAMCINOLONE ACETONIDE 0.5 % EX OINT: 1.0000 | TOPICAL_OINTMENT | Freq: Two times a day (BID) | CUTANEOUS | 0 refills | Status: DC

## 2022-01-19 MED ORDER — TRIAMCINOLONE ACETONIDE 40 MG/ML IJ SUSP: 40.0000 mg | Freq: Once | INTRAMUSCULAR | Status: DC

## 2022-01-19 NOTE — Progress Notes (Signed)
BP 131/81   Pulse 75   Temp 98.1 F (36.7 C)   Wt 198 lb 3.2 oz (89.9 kg)   SpO2 99%   BMI 36.24 kg/m    Subjective:    Patient ID: Jaime Freeman, female    DOB: Sep 15, 1955, 66 y.o.   MRN: [id number]  HPI: Jaime Freeman is a 66 y.o. female  Chief Complaint  Patient presents with   Rash    Patient states she has red, itchy bumps on her left arm and lower abdominal area for about a week.    RASH Duration:  about a week  Location: arm and abdomen  Itching: yes Burning: no Redness: yes Oozing: no Scaling: no Blisters: no Painful: no Fevers: no Change in detergents/soaps/personal care products: no Recent illness: no Recent travel:no History of same: no Context: worse Alleviating factors: nothing Treatments attempted:nothing Shortness of breath: no  Throat/tongue swelling: no Myalgias/arthralgias: no  Relevant past medical, surgical, family and social history reviewed and updated as indicated. Interim medical history since our last visit reviewed. Allergies and medications reviewed and updated.  Review of Systems  Constitutional: Negative.   Respiratory: Negative.    Cardiovascular: Negative.   Gastrointestinal: Negative.   Musculoskeletal: Negative.   Skin:  Positive for rash. Negative for color change, pallor and wound.  Psychiatric/Behavioral: Negative.      Per HPI unless specifically indicated above     Objective:    BP 131/81   Pulse 75   Temp 98.1 F (36.7 C)   Wt 198 lb 3.2 oz (89.9 kg)   SpO2 99%   BMI 36.24 kg/m   Wt Readings from Last 3 Encounters:  01/19/22 198 lb 3.2 oz (89.9 kg)  12/29/21 200 lb (90.7 kg)  12/08/21 201 lb 8 oz (91.4 kg)    Physical Exam Vitals and nursing note reviewed.  Constitutional:      General: She is not in acute distress.    Appearance: Normal appearance. She is obese. She is not ill-appearing, toxic-appearing or diaphoretic.  HENT:     Head: Normocephalic and atraumatic.     Right Ear: External ear  normal.     Left Ear: External ear normal.     Nose: Nose normal.     Mouth/Throat:     Mouth: Mucous membranes are moist.     Pharynx: Oropharynx is clear.  Eyes:     General: No scleral icterus.       Right eye: No discharge.        Left eye: No discharge.     Extraocular Movements: Extraocular movements intact.     Conjunctiva/sclera: Conjunctivae normal.     Pupils: Pupils are equal, round, and reactive to light.  Cardiovascular:     Rate and Rhythm: Normal rate and regular rhythm.     Pulses: Normal pulses.     Heart sounds: Normal heart sounds. No murmur heard.    No friction rub. No gallop.  Pulmonary:     Effort: Pulmonary effort is normal. No respiratory distress.     Breath sounds: Normal breath sounds. No stridor. No wheezing, rhonchi or rales.  Chest:     Chest wall: No tenderness.  Musculoskeletal:        General: Normal range of motion.     Cervical back: Normal range of motion and neck supple.  Skin:    General: Skin is warm and dry.     Capillary Refill: Capillary refill takes less than 2 seconds.  Coloration: Skin is not jaundiced or pale.     Findings: No bruising, erythema, lesion or rash.     Comments: Hives on L arm and into her belly  Neurological:     General: No focal deficit present.     Mental Status: She is alert and oriented to person, place, and time. Mental status is at baseline.  Psychiatric:        Mood and Affect: Mood normal.        Behavior: Behavior normal.        Thought Content: Thought content normal.        Judgment: Judgment normal.     Results for orders placed or performed in visit on 12/29/21  Rapid Strep Screen ([facility] ONLY)   Specimen: Other   Other  Result Value Ref Range   Strep Gp A Ag, IA W/Reflex Negative Negative  Culture, Group A Strep   Other  Result Value Ref Range   Strep A Culture Negative       Assessment & Plan:   Problem List Items Addressed This Visit   None Visit Diagnoses     Hives     -  Primary   Will treat with shot of triamcinalone followed by ointment. Call with any concerns or if not getting better.   Relevant Medications   triamcinolone acetonide (KENALOG-40) injection 40 mg (Start on 01/19/2022 11:30 AM)        Follow up plan: Return as scheduled.

## 2022-01-21 ENCOUNTER — Ambulatory Visit: Payer: Self-pay | Admitting: *Deleted

## 2022-01-21 NOTE — Telephone Encounter (Addendum)
  Medication:Reason for Disposition  [1] Caller has URGENT medicine question about med that PCP or specialist prescribed AND [2] triager unable to answer question     Micromedex : kenalog 40 Adverse reaction list  Common Endocrine metabolic: Cushing's syndrome Neurologic: Headache (Allergic rhinitis, 5.5% ; ; macular edema, 5% ) Ophthalmic: Pain in eye (Macular edema, 3% to 12% ), Raised intraocular pressure (Macular edema, 6% to 14% ) Respiratory: Pharyngitis (5.1% to 25% ) Other: Influenza-like illness (2% to 8.9% ) Reason for Disposition  [1] Caller has URGENT medicine question about med that PCP or specialist prescribed AND [2] triager unable to answer question  Answer Assessment - Initial Assessment Questions 1. NAME of MEDICINE: "What medicine(s) are you calling about?"     Sinus symptoms after injection- symptoms started symptoms soon after injection- nasal burning, throat burning/dry, fatigue 2. QUESTION: "What is your question?" (e.g., double dose of medicine, side effect)     Could she be having a reaction to the injection? 3. PRESCRIBER: "Who prescribed the medicine?" Reason: if prescribed by specialist, call should be referred to that group.     PCP 4. SYMPTOMS: "Do you have any symptoms?" If Yes, ask: "What symptoms are you having?"  "How bad are the symptoms (e.g., mild, moderate, severe)     Nasal burning, throat burning.dry, fatigue/tiredness, weakness-drained, last night- breathing was slow/labored and felt throat was partially swollen- now that is better 5. PREGNANCY:  "Is there any chance that you are pregnant?" "When was your last menstrual period?"       Patient has frequent medication reactions and thinks she may have had one to injection- she reports it really did help her hives though. Patient to increase fliuds and monitor symptoms- will send message to provider to see if there is anything she wants to to do at this point.  Protocols used: Medication Question  Call-A-AH

## 2022-01-21 NOTE — Telephone Encounter (Signed)
Pt called back. States forgot to tell NT in earlier call she is having anxiety, "Hyper and fast talking"  since yesterday. Denies palpitations, SOB. "Breathing shallow maybe just anxious." States last night felt throat was swelling, not presently. "Feeling drained.Friends tell me I'm talking fast, hyper."  Pt evasive historian. Called practice for consult, Iris. Assured pt nT would route to practice for PCPs review. Advised ED for any worsening symptoms.Pt verbalizes understanding.

## 2022-01-22 ENCOUNTER — Other Ambulatory Visit: Payer: Medicare HMO

## 2022-01-22 ENCOUNTER — Telehealth: Payer: Self-pay | Admitting: Family Medicine

## 2022-01-22 ENCOUNTER — Ambulatory Visit: Payer: Self-pay | Admitting: *Deleted

## 2022-01-22 NOTE — Telephone Encounter (Signed)
Medicine should be almost out of her system. She can take one of her nerve pills and drink a bunch of water.

## 2022-01-22 NOTE — Telephone Encounter (Signed)
Take zyrtec OTC.

## 2022-01-22 NOTE — Telephone Encounter (Signed)
Pt called to report after speaking with Destiny / she has a red spot that came up on her arm near her wrist / like how the rest started / and one on her stomach as well has come up / Pt wanted Dr. Wynetta Emery to know/ please advise

## 2022-01-22 NOTE — Telephone Encounter (Signed)
Spoke with patient to make her aware of Dr.Johnson recommendations. Patient says she "thanks" Dr.Johnson, but says she actual woke up this morning and was feeling better. Patient verbalized understanding.

## 2022-01-22 NOTE — Telephone Encounter (Signed)
Patient returned call and review with patient message from Dr. Wynetta Emery from 01/22/22 to take zyrtec OTC. Patient reports has can not take zyrtec due to side effects. Reports her system is "crazy". Requesting if she can take Claritin or benadryl. Patient will also use prescribed "ointment" to see if that will help with "red spots". Recommended patient to consider asking pharmacist as well. Please advise .

## 2022-01-22 NOTE — Telephone Encounter (Signed)
Called patient to provide her with Dr.Johnson's recommendations. Unable to leave message for patient due to voicemail being full.   OK for PEC to give note if patient calls back.

## 2022-01-25 NOTE — Telephone Encounter (Signed)
Spoke with patient and she says she is feeling much better, but wanted to inform Dr.Johnson that she stays with son at night since her husband passed away. Patient says she figured out what wrong, she says she say her grandsons have been sick and she says that their tooth brushes touched and she said that this is the only way she could have gotten sick. She says she apologizes for all the calls and will try and handle the cold before she calls in again for any medication.

## 2022-01-25 NOTE — Telephone Encounter (Signed)
Yes. She can take any OTC allergy medicine that helps her and she should use the ointment.

## 2022-02-02 ENCOUNTER — Ambulatory Visit: Payer: Self-pay

## 2022-02-02 NOTE — Telephone Encounter (Signed)
  Chief Complaint: Infected wound on leg Symptoms: Warm red open wound Frequency: 1 week Pertinent Negatives: Patient denies fever Disposition: '[]'$ ED /'[]'$ Urgent Care (no appt availability in office) / '[x]'$ Appointment(In office/virtual)/ '[]'$  [facility]/ '[]'$ Home Care/ '[]'$ Refused Recommended Disposition /'[]'$ [place] [facility]/ '[]'$  Follow-up with PCP  Additional Notes: Pt cut open her leg below her knee about 1 week ago. Pt dressed wound at that time and forgot about it. Yesterday pt had pain and remembered wound. PT removed Band-Aid and found wound to be red warm and a little swollen. Pt cleaned wound used otc antibiotic ointment.  Wound continues to be red and warm. Wound is about 3in X3in.  Pt thinks she had a tetanus shot recently.  Summary: knee womb   Pt states she hit her knee on the couch and tore skin x1w   Pt states she put a bandaid on the womb and forgot to change it   Pt states her womb is now painful and red   Pt inquiring if she is up to date on her tetanus shot   Please fu w/ pt      Reason for Disposition  Other signs of wound infection  Answer Assessment - Initial Assessment Questions 1. LOCATION: "Where is the wound located?"      About 4 inches below right knee -  2. WOUND APPEARANCE: "What does the wound look like?"      Red -  3. SIZE: If redness is present, ask: "What is the size of the red area?" (Inches, centimeters, or compare to size of a coin)      3 inches by 3 inches 4. SPREAD: "What's changed in the last day?"  "Do you see any red streaks coming from the wound?"     Getting a little smaller 5. ONSET: "When did it start to look infected?"      Unsure 6. MECHANISM: "How did the wound start, what was the cause?"     Cut on sofa 7. PAIN: "Is there any pain?" If Yes, ask: "How bad is the pain?"   (Scale 1-10; or mild, moderate, severe)     Yesterday  - was painful - has reduced now 8. FEVER: "Do you have a fever?" If Yes, ask: "What is your  temperature, how was it measured, and when did it start?"     Wound is warm 9. OTHER SYMPTOMS: "Do you have any other symptoms?" (e.g., shaking chills, weakness, rash elsewhere on body)     no 10. PREGNANCY: "Is there any chance you are pregnant?" "When was your last menstrual period?"       no  Protocols used: Wound Infection-A-AH

## 2022-02-03 ENCOUNTER — Encounter: Payer: Self-pay | Admitting: Physician Assistant

## 2022-02-03 ENCOUNTER — Ambulatory Visit (INDEPENDENT_AMBULATORY_CARE_PROVIDER_SITE_OTHER): Payer: Medicare HMO | Admitting: Physician Assistant

## 2022-02-03 VITALS — BP 132/74 | HR 67 | Temp 98.3°F | Ht 62.01 in | Wt 195.1 lb

## 2022-02-03 DIAGNOSIS — S81801A Unspecified open wound, right lower leg, initial encounter: Secondary | ICD-10-CM | POA: Diagnosis not present

## 2022-02-03 MED ORDER — MUPIROCIN 2 % EX OINT: 1.0000 | TOPICAL_OINTMENT | Freq: Two times a day (BID) | CUTANEOUS | 0 refills | Status: DC

## 2022-02-03 NOTE — Progress Notes (Signed)
Acute Office Visit   Patient: Jaime Freeman   DOB: [birth year]   66 y.o. Female  MRN: [id number] Visit Date: 02/03/2022  Today's healthcare provider: Dani Gobble Christopher Hink, PA-C  Introduced myself to the patient as a Journalist, newspaper and provided education on APPs in clinical practice.    Chief Complaint  Patient presents with   Open Wound    For past week, Wound is red and swollen. Has been putting OTC abx cream on    Subjective    HPI HPI     Open Wound    Additional comments: For past week, Wound is red and swollen. Has been putting OTC abx cream on       Last edited by Jerelene Redden, CMA on 02/03/2022  9:41 AM.      Reports she hit her right shin on a piece of furniture and cut open her leg She reports the wound has gotten more red and is tender around the borders Reports redness seems to be improving She has been using OTC antibacterial cream   Medications: Outpatient Medications Prior to Visit  Medication Sig   albuterol (PROVENTIL) (2.5 MG/3ML) 0.083% nebulizer solution Take 3 mLs (2.5 mg total) by nebulization every 6 (six) hours as needed for wheezing or shortness of breath.   albuterol (VENTOLIN HFA) 108 (90 Base) MCG/ACT inhaler Inhale 2 puffs into the lungs every 4 (four) hours as needed for wheezing or shortness of breath.   Ascorbic Acid (VITAMIN C PO) Take by mouth.   aspirin EC 81 MG tablet Take 81 mg by mouth daily.   benazepril (LOTENSIN) 20 MG tablet Take 1 tablet (20 mg total) by mouth daily.   bisacodyl (DULCOLAX) 5 MG EC tablet Take 5 mg by mouth daily as needed for moderate constipation.   clonazePAM (KLONOPIN) 0.5 MG tablet Take 1 tablet (0.5 mg total) by mouth 2 (two) times daily as needed for anxiety.   etodolac (LODINE) 400 MG tablet Take 1 tablet (400 mg total) by mouth 2 (two) times daily as needed.   LORazepam (ATIVAN) 1 MG tablet Take 1 mg by mouth 2 (two) times daily.   Multiple Vitamin (MULTI VITAMIN PO) Take by mouth.   Multiple  Vitamins-Minerals (ZINC PO) Take by mouth.   nystatin ointment (MYCOSTATIN) Apply 1 Application topically 2 (two) times daily.   omeprazole (PRILOSEC) 20 MG capsule Take 1 capsule (20 mg total) by mouth daily.   QUEtiapine (SEROQUEL) 25 MG tablet TAKE ONE TO TWO TABLETS BY MOUTH DAILY AT BEDTIME. MAY TAKE ADDITIONAL ONE-HALF TABLET UP TO THREE TIMES DAILY AS NEEDED FOR ANXIETY   senna (SENOKOT) 8.6 MG tablet Take 1 tablet by mouth daily. Take 2 tablets at night   triamcinolone ointment (KENALOG) 0.5 % Apply 1 Application topically 2 (two) times daily.   Facility-Administered Medications Prior to Visit  Medication Dose Route Frequency Provider   triamcinolone acetonide (KENALOG-40) injection 40 mg  40 mg Intramuscular Once Johnson, Megan P, DO    Review of Systems  Constitutional:  Negative for chills, diaphoresis and fever.  Skin:  Positive for wound.       Objective    BP 132/74   Pulse 67   Temp 98.3 F (36.8 C) (Oral)   Ht 5' 2.01" (1.575 m)   Wt 195 lb 1.6 oz (88.5 kg)   SpO2 98%   BMI 35.68 kg/m    Physical Exam Vitals reviewed.  Constitutional:  General: She is awake.     Appearance: Normal appearance. She is well-developed and well-groomed.  HENT:     Head: Normocephalic and atraumatic.  Eyes:     General: Lids are normal. Gaze aligned appropriately.     Extraocular Movements: Extraocular movements intact.     Conjunctiva/sclera: Conjunctivae normal.  Pulmonary:     Effort: Pulmonary effort is normal.  Musculoskeletal:     Cervical back: Normal range of motion.  Skin:    General: Skin is warm.     Findings: Erythema and wound present.     Comments: Approx 2 cm diameter wound - scabbing present, not actively draining or bleeding Surrounding erythema and mild warmth, no streaking observed   Neurological:     Mental Status: She is alert.  Psychiatric:        Attention and Perception: Attention and perception normal.        Mood and Affect: Mood and  affect normal.        Speech: Speech normal.        Behavior: Behavior normal. Behavior is cooperative.       No results found for any visits on 02/03/22.  Assessment & Plan      No follow-ups on file.      Problem List Items Addressed This Visit   None Visit Diagnoses     Wound of right lower extremity, initial encounter    -  Primary Acute, new concern Reports she received injury last week and has been using OTC antibacterial ointment and a bandaide for coverage Recommend she use warm water and gentle soap for cleansing, no alcohol or peroxide, avoid using OTC antibacterial ointments to prevent further irritation Will provide Mupirocin ointment to be applied two times per day until resolved Recommend using bandage to cover to prevent contamination  Reviewed return precautions and patient voiced understanding and agreement Follow up as needed    Relevant Medications   mupirocin ointment (BACTROBAN) 2 %        No follow-ups on file.   I, Belford Pascucci E Lisaanne Lawrie, PA-C, have reviewed all documentation for this visit. The documentation on 02/03/22 for the exam, diagnosis, procedures, and orders are all accurate and complete.   Talitha Givens, MHS, PA-C [facility]

## 2022-02-03 NOTE — Patient Instructions (Addendum)
   You can try taking Flonase to help with the congestion and Xyzal to help with the allergies- if you are able to tolerate it without concerns  For your leg wound, please do the following:  Wash with warm water and gentle soap- no alcohol or peroxide Do not use neosporin or bacitracin ointments anymore  Use the Mupriocin ointment I have prescribed twice per day and keep the area covered with a bandage so dirt and things cannot get into the wound  If you notice it is draining, becoming more red, swollen or there are streaks spreading from it please call us so we can get you back in to evaluate.

## 2022-02-15 NOTE — Progress Notes (Deleted)
          Acute Office Visit   Patient: Jaime Freeman   DOB: 16-Aug-1955   66 y.o. Female  MRN: [id number] Visit Date: 02/16/2022  Today's healthcare provider: Dani Gobble Teaghan Formica, PA-C  Introduced myself to the patient as a Journalist, newspaper and provided education on APPs in clinical practice.    No chief complaint on file.  Subjective    HPI   Concern for Sinusitis Onset: Duration:  Associated symptoms Alleviating: Aggravating:  Interventions: COVID testing:   Results:   Medications: Outpatient Medications Prior to Visit  Medication Sig   albuterol (PROVENTIL) (2.5 MG/3ML) 0.083% nebulizer solution Take 3 mLs (2.5 mg total) by nebulization every 6 (six) hours as needed for wheezing or shortness of breath.   albuterol (VENTOLIN HFA) 108 (90 Base) MCG/ACT inhaler Inhale 2 puffs into the lungs every 4 (four) hours as needed for wheezing or shortness of breath.   Ascorbic Acid (VITAMIN C PO) Take by mouth.   aspirin EC 81 MG tablet Take 81 mg by mouth daily.   benazepril (LOTENSIN) 20 MG tablet Take 1 tablet (20 mg total) by mouth daily.   bisacodyl (DULCOLAX) 5 MG EC tablet Take 5 mg by mouth daily as needed for moderate constipation.   clonazePAM (KLONOPIN) 0.5 MG tablet Take 1 tablet (0.5 mg total) by mouth 2 (two) times daily as needed for anxiety.   etodolac (LODINE) 400 MG tablet Take 1 tablet (400 mg total) by mouth 2 (two) times daily as needed.   LORazepam (ATIVAN) 1 MG tablet Take 1 mg by mouth 2 (two) times daily.   Multiple Vitamin (MULTI VITAMIN PO) Take by mouth.   Multiple Vitamins-Minerals (ZINC PO) Take by mouth.   mupirocin ointment (BACTROBAN) 2 % Apply 1 Application topically 2 (two) times daily.   nystatin ointment (MYCOSTATIN) Apply 1 Application topically 2 (two) times daily.   omeprazole (PRILOSEC) 20 MG capsule Take 1 capsule (20 mg total) by mouth daily.   QUEtiapine (SEROQUEL) 25 MG tablet TAKE ONE TO TWO TABLETS BY MOUTH DAILY AT BEDTIME. MAY TAKE ADDITIONAL ONE-HALF  TABLET UP TO THREE TIMES DAILY AS NEEDED FOR ANXIETY   senna (SENOKOT) 8.6 MG tablet Take 1 tablet by mouth daily. Take 2 tablets at night   triamcinolone ointment (KENALOG) 0.5 % Apply 1 Application topically 2 (two) times daily.   Facility-Administered Medications Prior to Visit  Medication Dose Route Frequency Provider   triamcinolone acetonide (KENALOG-40) injection 40 mg  40 mg Intramuscular Once Johnson, Megan P, DO    Review of Systems  {Labs  Heme  Chem  Endocrine  Serology  Results Review (optional):23779}   Objective    There were no vitals taken for this visit. {Show previous vital signs (optional):23777}  Physical Exam    No results found for any visits on 02/16/22.  Assessment & Plan      No follow-ups on file.

## 2022-02-16 ENCOUNTER — Ambulatory Visit: Payer: Medicare HMO | Admitting: Physician Assistant

## 2022-02-18 ENCOUNTER — Ambulatory Visit: Payer: Self-pay

## 2022-02-18 NOTE — Telephone Encounter (Signed)
  Chief Complaint: Urinary frequency, burning, leakage Symptoms: burning frequency, leakage Frequency: Past few days Pertinent Negatives: Patient denies fever, inability to urinate Disposition: '[]'$ ED /'[]'$ Urgent Care (no appt availability in office) / '[x]'$ Appointment(In office/virtual)/ '[]'$  [place] [facility]/ '[]'$ Home Care/ '[]'$ Refused Recommended Disposition /'[]'$ [place] [facility]/ '[]'$  Follow-up with PCP Additional Notes: PT has experienced leakage, burning, and frequency the past few days. PT thinks this is a UTI.  Summary: Possible uti, urinary frequency and burning   The patient called in stating she thinks she may have a uti. She is having urinary frequency and leakage. She also has been having pain over the last couple of days with burning as well. Please assist patient further      Reason for Disposition  Urinating more frequently than usual (i.e., frequency)  Answer Assessment - Initial Assessment Questions 1. SYMPTOM: "What's the main symptom you're concerned about?" (e.g., frequency, incontinence)     Frequency, leakage, pain 2. ONSET: "When did the  leakage  start?"     Past couple of days 3. PAIN: "Is there any pain?" If Yes, ask: "How bad is it?" (Scale: 1-10; mild, moderate, severe)     Mild - moderate 4. CAUSE: "What do you think is causing the symptoms?"     UTI 5. OTHER SYMPTOMS: "Do you have any other symptoms?" (e.g., blood in urine, fever, flank pain, pain with urination)     Burning with Urination. 6. PREGNANCY: "Is there any chance you are pregnant?" "When was your last menstrual period?"     na  Protocols used: Urinary Symptoms-A-AH

## 2022-02-19 ENCOUNTER — Encounter: Payer: Self-pay | Admitting: Physician Assistant

## 2022-02-19 ENCOUNTER — Ambulatory Visit (INDEPENDENT_AMBULATORY_CARE_PROVIDER_SITE_OTHER): Payer: Medicare HMO | Admitting: Physician Assistant

## 2022-02-19 VITALS — BP 148/79 | HR 65 | Temp 97.7°F | Wt 198.5 lb

## 2022-02-19 DIAGNOSIS — B3731 Acute candidiasis of vulva and vagina: Secondary | ICD-10-CM

## 2022-02-19 DIAGNOSIS — R300 Dysuria: Secondary | ICD-10-CM | POA: Diagnosis not present

## 2022-02-19 DIAGNOSIS — F319 Bipolar disorder, unspecified: Secondary | ICD-10-CM | POA: Diagnosis not present

## 2022-02-19 LAB — URINALYSIS, ROUTINE W REFLEX MICROSCOPIC
Bilirubin, UA: NEGATIVE
Glucose, UA: NEGATIVE
Ketones, UA: NEGATIVE
Leukocytes,UA: NEGATIVE
Nitrite, UA: NEGATIVE
Protein,UA: NEGATIVE
RBC, UA: NEGATIVE
Specific Gravity, UA: 1.02 (ref 1.005–1.030)
Urobilinogen, Ur: 0.2 mg/dL (ref 0.2–1.0)
pH, UA: 6.5 (ref 5.0–7.5)

## 2022-02-19 MED ORDER — FLUCONAZOLE 150 MG PO TABS: 150.0000 mg | ORAL_TABLET | ORAL | 0 refills | Status: DC | PRN

## 2022-02-19 NOTE — Progress Notes (Signed)
Acute Office Visit   Patient: Jaime Freeman   DOB: 1956/01/26   66 y.o. Female  MRN: [id number] Visit Date: 02/19/2022  Today's healthcare provider: Dani Gobble Rayley Gao, PA-C  Introduced myself to the patient as a Journalist, newspaper and provided education on APPs in clinical practice.    Chief Complaint  Patient presents with   Urinary Tract Infection    Pt states she has noticed occasional pain while urinating for the past few weeks. States the pain has gotten worse in the last few days. States she did take AZO when the pain first started and did have a little but of relief.    Subjective    Urinary Tract Infection  Pertinent negatives include no chills, flank pain, frequency, hematuria, nausea, urgency or vomiting.   HPI     Urinary Tract Infection    Additional comments: Pt states she has noticed occasional pain while urinating for the past few weeks. States the pain has gotten worse in the last few days. States she did take AZO when the pain first started and did have a little but of relief.       Last edited by Georgina Peer, CMA on 02/19/2022  9:09 AM.       Concern for UTI She states this started sometime about 2 weeks ago  She reports she is having some shooting pain with urination  Reports some pain with movement especially in urethra  Denies abdominal pain and flank pain  She reports some mild urine leakage and urge incontinence since this started  Reports it seems like it is getting better  She reports she has some discomfort in vulvovaginal area even when not urinating  States "it feels like a cut"  She also reports some post-nasal drainage and slight sore throat     Medications: Outpatient Medications Prior to Visit  Medication Sig   albuterol (PROVENTIL) (2.5 MG/3ML) 0.083% nebulizer solution Take 3 mLs (2.5 mg total) by nebulization every 6 (six) hours as needed for wheezing or shortness of breath.   albuterol (VENTOLIN HFA) 108 (90 Base) MCG/ACT inhaler  Inhale 2 puffs into the lungs every 4 (four) hours as needed for wheezing or shortness of breath.   Ascorbic Acid (VITAMIN C PO) Take by mouth.   aspirin EC 81 MG tablet Take 81 mg by mouth daily.   benazepril (LOTENSIN) 20 MG tablet Take 1 tablet (20 mg total) by mouth daily.   bisacodyl (DULCOLAX) 5 MG EC tablet Take 5 mg by mouth daily as needed for moderate constipation.   clonazePAM (KLONOPIN) 0.5 MG tablet Take 1 tablet (0.5 mg total) by mouth 2 (two) times daily as needed for anxiety.   etodolac (LODINE) 400 MG tablet Take 1 tablet (400 mg total) by mouth 2 (two) times daily as needed.   LORazepam (ATIVAN) 1 MG tablet Take 1 mg by mouth 2 (two) times daily.   Multiple Vitamin (MULTI VITAMIN PO) Take by mouth.   Multiple Vitamins-Minerals (ZINC PO) Take by mouth.   mupirocin ointment (BACTROBAN) 2 % Apply 1 Application topically 2 (two) times daily.   nystatin ointment (MYCOSTATIN) Apply 1 Application topically 2 (two) times daily.   omeprazole (PRILOSEC) 20 MG capsule Take 1 capsule (20 mg total) by mouth daily.   QUEtiapine (SEROQUEL) 25 MG tablet TAKE ONE TO TWO TABLETS BY MOUTH DAILY AT BEDTIME. MAY TAKE ADDITIONAL ONE-HALF TABLET UP TO THREE TIMES DAILY AS NEEDED FOR ANXIETY  senna (SENOKOT) 8.6 MG tablet Take 1 tablet by mouth daily. Take 2 tablets at night   triamcinolone ointment (KENALOG) 0.5 % Apply 1 Application topically 2 (two) times daily.   Facility-Administered Medications Prior to Visit  Medication Dose Route Frequency Provider   triamcinolone acetonide (KENALOG-40) injection 40 mg  40 mg Intramuscular Once Johnson, Megan P, DO    Review of Systems  Constitutional:  Negative for chills, diaphoresis and fever.  HENT:  Positive for postnasal drip and sore throat.   Gastrointestinal:  Negative for abdominal pain, diarrhea, nausea and vomiting.  Genitourinary:  Positive for dysuria. Negative for decreased urine volume, difficulty urinating, enuresis, flank pain,  frequency, hematuria and urgency.       Objective    BP (!) 148/79 (BP Location: Right Arm, Cuff Size: Normal)   Pulse 65   Temp 97.7 F (36.5 C) (Oral)   Wt 198 lb 8 oz (90 kg)   SpO2 98%   BMI 36.30 kg/m    Physical Exam Vitals reviewed.  Constitutional:      General: She is awake.     Appearance: Normal appearance. She is well-developed and well-groomed.  HENT:     Head: Normocephalic and atraumatic.     Mouth/Throat:     Mouth: Mucous membranes are moist.     Pharynx: Oropharynx is clear. No oropharyngeal exudate or posterior oropharyngeal erythema.  Eyes:     Extraocular Movements: Extraocular movements intact.     Conjunctiva/sclera: Conjunctivae normal.     Pupils: Pupils are equal, round, and reactive to light.  Cardiovascular:     Rate and Rhythm: Normal rate and regular rhythm.     Pulses: Normal pulses.     Heart sounds: No murmur heard.    No friction rub. No gallop.  Pulmonary:     Effort: Pulmonary effort is normal. No respiratory distress.     Breath sounds: No wheezing, rhonchi or rales.  Abdominal:     General: Abdomen is protuberant. Bowel sounds are normal.     Palpations: Abdomen is soft.     Tenderness: There is no abdominal tenderness. There is no right CVA tenderness or left CVA tenderness.  Lymphadenopathy:     Head:     Right side of head: No submental or submandibular adenopathy.     Left side of head: No submental, submandibular or tonsillar adenopathy.     Cervical:     Right cervical: No superficial or posterior cervical adenopathy.    Left cervical: No superficial or posterior cervical adenopathy.     Upper Body:     Right upper body: No supraclavicular adenopathy.     Left upper body: No supraclavicular adenopathy.  Neurological:     General: No focal deficit present.     Mental Status: She is alert and oriented to person, place, and time.  Psychiatric:        Mood and Affect: Mood normal.        Behavior: Behavior normal.  Behavior is cooperative.        Thought Content: Thought content normal.        Judgment: Judgment normal.       Results for orders placed or performed in visit on 02/19/22  Urinalysis, Routine w reflex microscopic  Result Value Ref Range   Specific Gravity, UA 1.020 1.005 - 1.030   pH, UA 6.5 5.0 - 7.5   Color, UA Yellow Yellow   Appearance Ur Clear Clear   Leukocytes,UA Negative Negative  Protein,UA Negative Negative/Trace   Glucose, UA Negative Negative   Ketones, UA Negative Negative   RBC, UA Negative Negative   Bilirubin, UA Negative Negative   Urobilinogen, Ur 0.2 0.2 - 1.0 mg/dL   Nitrite, UA Negative Negative   Microscopic Examination Comment     Assessment & Plan      No follow-ups on file.      Problem List Items Addressed This Visit   None Visit Diagnoses     Dysuria    -  Primary Acute, new concern Reports mild dysuria and increased frequency UA was negative for signs of active UTI  Suspect she is experiencing vulvovaginal candida infection     Relevant Orders   Urinalysis, Routine w reflex microscopic (Completed)   Vaginal candida     Acute, new concern Reports mild burning and irritation while urinating and with certain position changes Suspect vulvovaginal candida at this time Will provide Diflucan 150 mg PO once every 72 hours to assist with symptoms Recommend she return to office if symptoms are not subsiding with this and we will perform wet prep/ pelvic exam as indicated Follow up as needed     Relevant Medications   fluconazole (DIFLUCAN) 150 MG tablet        No follow-ups on file.   I, Derrill Bagnell E Linden Mikes, PA-C, have reviewed all documentation for this visit. The documentation on 02/19/22 for the exam, diagnosis, procedures, and orders are all accurate and complete.   Talitha Givens, MHS, PA-C [facility]

## 2022-02-22 ENCOUNTER — Ambulatory Visit: Payer: Self-pay | Admitting: *Deleted

## 2022-02-22 NOTE — Telephone Encounter (Signed)
Summary: Dizziness + Med questions   [phone] pt called and is experiencing dizziness and has a med question about her dentist appt tomorrow. Wants to check the medicine that was prescribed by the dentist.      Reason for Disposition  [1] MILD dizziness (e.g., walking normally) AND [2] has NOT been evaluated by doctor (or NP/PA) for this  (Exception: Dizziness caused by heat exposure, sudden standing, or poor fluid intake.)  Answer Assessment - Initial Assessment Questions 1. DESCRIPTION: "Describe your dizziness."     Off balance- yesterday- vertigo today 2. LIGHTHEADED: "Do you feel lightheaded?" (e.g., somewhat faint, woozy, weak upon standing)     "Odd", woozy 3. VERTIGO: "Do you feel like either you or the room is spinning or tilting?" (i.e. vertigo)     Hx vertigo 4. SEVERITY: "How bad is it?"  "Do you feel like you are going to faint?" "Can you stand and walk?"   - MILD: Feels slightly dizzy, but walking normally.   - MODERATE: Feels unsteady when walking, but not falling; interferes with normal activities (e.g., school, work).   - SEVERE: Unable to walk without falling, or requires assistance to walk without falling; feels like passing out now.      Off balance-mild 5. ONSET:  "When did the dizziness begin?"     yesterday 6. AGGRAVATING FACTORS: "Does anything make it worse?" (e.g., standing, change in head position)     changing head position makes worse 7. HEART RATE: "Can you tell me your heart rate?" "How many beats in 15 seconds?"  (Note: not all patients can do this)       BP 154/87, P 74 8. CAUSE: "What do you think is causing the dizziness?"     Not sure 9. RECURRENT SYMPTOM: "Have you had dizziness before?" If Yes, ask: "When was the last time?" "What happened that time?"     yes 10. OTHER SYMPTOMS: "Do you have any other symptoms?" (e.g., fever, chest pain, vomiting, diarrhea, bleeding)       no 11. PREGNANCY: "Is there any chance you are pregnant?" "When was your  last menstrual period?"       *No Answer*  Protocols used: Dizziness - Lightheadedness-A-AH

## 2022-02-22 NOTE — Telephone Encounter (Signed)
  Chief Complaint: dizziness Symptoms: dizziness, head feels funny, off balance Frequency: started yesterday Pertinent Negatives: Patient denies fever, chest pain, vomiting, diarrhea, bleeding Disposition: '[]'$ ED /'[]'$ Urgent Care (no appt availability in office) / '[]'$ Appointment(In office/virtual)/ '[]'$  [place] [facility]/ '[]'$ Home Care/ '[]'$ Refused Recommended Disposition /'[]'$ [place] [facility]/ '[x]'$  Follow-up with PCP Additional Notes: Patient is concerned about return of vertigo- very dizzy today- off balance. Patient has a dental procedure tomorrow and has to be sedated. She wants to make sure she can have that done. Patient would like to be seen today at office so she doesn't have to miss this procedure.Advised no appointment today- will send message to PCP.

## 2022-02-22 NOTE — Telephone Encounter (Signed)
Patient states she is feeling better, no longer dizzy.

## 2022-02-22 NOTE — Telephone Encounter (Signed)
Unfortunately I don't have anything today. I can see her Wednesday, but if she needs to be seen today, I would advise her to go to urgent care.

## 2022-03-11 ENCOUNTER — Ambulatory Visit (INDEPENDENT_AMBULATORY_CARE_PROVIDER_SITE_OTHER): Payer: Medicare HMO | Admitting: Family Medicine

## 2022-03-11 ENCOUNTER — Encounter: Payer: Self-pay | Admitting: Family Medicine

## 2022-03-11 VITALS — BP 138/79 | HR 73 | Temp 98.9°F | Ht 62.0 in | Wt 196.3 lb

## 2022-03-11 DIAGNOSIS — N760 Acute vaginitis: Secondary | ICD-10-CM

## 2022-03-11 DIAGNOSIS — R300 Dysuria: Secondary | ICD-10-CM | POA: Diagnosis not present

## 2022-03-11 DIAGNOSIS — B9689 Other specified bacterial agents as the cause of diseases classified elsewhere: Secondary | ICD-10-CM

## 2022-03-11 DIAGNOSIS — R21 Rash and other nonspecific skin eruption: Secondary | ICD-10-CM | POA: Diagnosis not present

## 2022-03-11 DIAGNOSIS — L509 Urticaria, unspecified: Secondary | ICD-10-CM

## 2022-03-11 LAB — URINALYSIS, ROUTINE W REFLEX MICROSCOPIC
Glucose, UA: NEGATIVE
Ketones, UA: NEGATIVE
Leukocytes,UA: NEGATIVE
Nitrite, UA: NEGATIVE
Protein,UA: NEGATIVE
RBC, UA: NEGATIVE
Specific Gravity, UA: 1.02 (ref 1.005–1.030)
Urobilinogen, Ur: 0.2 mg/dL (ref 0.2–1.0)
pH, UA: 5.5 (ref 5.0–7.5)

## 2022-03-11 LAB — WET PREP FOR TRICH, YEAST, CLUE
Clue Cell Exam: POSITIVE — AB
Trichomonas Exam: NEGATIVE
Yeast Exam: NEGATIVE

## 2022-03-11 MED ORDER — METRONIDAZOLE 500 MG PO TABS: 500.0000 mg | ORAL_TABLET | Freq: Two times a day (BID) | ORAL | 0 refills | Status: DC

## 2022-03-11 NOTE — Progress Notes (Signed)
BP 138/79   Pulse 73   Temp 98.9 F (37.2 C) (Oral)   Ht '5\' 2"'$  (1.575 m)   Wt 196 lb 4.8 oz (89 kg)   SpO2 99%   BMI 35.90 kg/m    Subjective:    Patient ID: Jaime Freeman, female    DOB: 1956-01-14, 66 y.o.   MRN: [id number]  HPI: Jaime Freeman is a 66 y.o. female  Chief Complaint  Patient presents with   Dysuria    Patient is here to follow up on Dysuria. Patient says she feels her symptoms never cleared all the way and says it seemed like it got better, but after a few days it came back. Patient says she feels better for about a day or so.    URINARY SYMPTOMS- was seen a month ago for Dysuria. No UTI- found to have yeast infection. Was started on fluconazole. Does not feel like this resolved.  Duration: about a month Dysuria: yes Urinary frequency: yes Urgency: yes Small volume voids: no Symptom severity:  moderate Urinary incontinence: no Foul odor:  yes Hematuria: no Abdominal pain: no Back pain: no Suprapubic pain/pressure: no Flank pain: no Fever:  no Vomiting: no Relief with cranberry juice: no Relief with pyridium: no Status: stable Previous urinary tract infection: no Recurrent urinary tract infection: no Sexual activity: No sexually active History of sexually transmitted disease: no Vaginal discharge: no Treatments attempted: fluconazole, increasing fluids   RASH Duration:  chronic  Location: abdomen and arms  Itching: yes Burning: yes Redness: yes Oozing: no Scaling: no Blisters: no Painful: no Fevers: no Change in detergents/soaps/personal care products: no Recent illness: no Recent travel:no History of same: no Context: fluctuating Alleviating factors: nothing Treatments attempted:nothing Shortness of breath: no  Throat/tongue swelling: no Myalgias/arthralgias: no   Relevant past medical, surgical, family and social history reviewed and updated as indicated. Interim medical history since our last visit reviewed. Allergies and  medications reviewed and updated.  Review of Systems  Constitutional: Negative.   Respiratory: Negative.    Cardiovascular: Negative.   Gastrointestinal:  Positive for abdominal distention and abdominal pain. Negative for anal bleeding, blood in stool, constipation, diarrhea, nausea, rectal pain and vomiting.  Genitourinary:  Positive for vaginal discharge. Negative for decreased urine volume, difficulty urinating, dyspareunia, dysuria, enuresis, flank pain, frequency, genital sores, hematuria, menstrual problem, pelvic pain, urgency, vaginal bleeding and vaginal pain.  Musculoskeletal: Negative.   Neurological: Negative.   Psychiatric/Behavioral: Negative.      Per HPI unless specifically indicated above     Objective:    BP 138/79   Pulse 73   Temp 98.9 F (37.2 C) (Oral)   Ht '5\' 2"'$  (1.575 m)   Wt 196 lb 4.8 oz (89 kg)   SpO2 99%   BMI 35.90 kg/m   Wt Readings from Last 3 Encounters:  03/11/22 196 lb 4.8 oz (89 kg)  02/19/22 198 lb 8 oz (90 kg)  02/03/22 195 lb 1.6 oz (88.5 kg)    Physical Exam Vitals and nursing note reviewed.  Constitutional:      General: She is not in acute distress.    Appearance: Normal appearance. She is well-developed. She is obese. She is not ill-appearing, toxic-appearing or diaphoretic.  HENT:     Head: Normocephalic and atraumatic.     Right Ear: Hearing, tympanic membrane, ear canal and external ear normal. There is no impacted cerumen.     Left Ear: Hearing, tympanic membrane, ear canal and external ear  normal. There is no impacted cerumen.     Nose: Nose normal. No congestion or rhinorrhea.     Mouth/Throat:     Mouth: Mucous membranes are moist.     Pharynx: Oropharynx is clear. No oropharyngeal exudate or posterior oropharyngeal erythema.  Eyes:     General: Lids are normal. No scleral icterus.       Right eye: No discharge.        Left eye: No discharge.     Extraocular Movements: Extraocular movements intact.      Conjunctiva/sclera: Conjunctivae normal.     Pupils: Pupils are equal, round, and reactive to light.  Cardiovascular:     Rate and Rhythm: Normal rate and regular rhythm.     Pulses: Normal pulses.     Heart sounds: Normal heart sounds. No murmur heard.    No friction rub. No gallop.  Pulmonary:     Effort: Pulmonary effort is normal. No respiratory distress.     Breath sounds: Normal breath sounds. No stridor. No wheezing, rhonchi or rales.  Chest:     Chest wall: No tenderness.  Musculoskeletal:        General: Normal range of motion.     Cervical back: Normal range of motion and neck supple.  Skin:    General: Skin is warm and dry.     Capillary Refill: Capillary refill takes less than 2 seconds.     Coloration: Skin is not jaundiced or pale.     Findings: No bruising, erythema, lesion or rash.     Comments: Well healing irritation on anterior abdomen. No sign of hives at this time.   Neurological:     General: No focal deficit present.     Mental Status: She is alert and oriented to person, place, and time. Mental status is at baseline.  Psychiatric:        Mood and Affect: Mood normal.        Speech: Speech normal.        Behavior: Behavior normal.        Thought Content: Thought content normal.        Judgment: Judgment normal.     Results for orders placed or performed in visit on 03/11/22  WET PREP FOR [place], YEAST, CLUE   Specimen: Urine   Urine  Result Value Ref Range   Trichomonas Exam Negative Negative   Yeast Exam Negative Negative   Clue Cell Exam Positive (A) Negative  Urinalysis, Routine w reflex microscopic  Result Value Ref Range   Specific Gravity, UA 1.020 1.005 - 1.030   pH, UA 5.5 5.0 - 7.5   Color, UA Yellow Yellow   Appearance Ur Clear Clear   Leukocytes,UA Negative Negative   Protein,UA Negative Negative/Trace   Glucose, UA Negative Negative   Ketones, UA Negative Negative   RBC, UA Negative Negative   Bilirubin, UA 3+ (A) Negative    Urobilinogen, Ur 0.2 0.2 - 1.0 mg/dL   Nitrite, UA Negative Negative   Microscopic Examination Comment       Assessment & Plan:   Problem List Items Addressed This Visit   None Visit Diagnoses     BV (bacterial vaginosis)    -  Primary   Will treat with metronidazole. Call with any concerns. Continue to monitor.   Relevant Medications   metroNIDAZOLE (FLAGYL) 500 MG tablet   Dysuria       + clue cells   Relevant Orders   Urinalysis, Routine w  reflex microscopic (Completed)   WET PREP FOR [place], YEAST, CLUE (Completed)   Hives       Will get her into allergy to evaluate for allergies. Will start claritin prn. Call with any concerns.   Relevant Orders   Ambulatory referral to Allergy   Rash       Appears to be from rubbing. Advised her to use vasaline and leave it open to air for a couple of hours a day. Call with any concerns.        Follow up plan: Return as scheduled.

## 2022-03-24 ENCOUNTER — Ambulatory Visit: Payer: Self-pay | Admitting: *Deleted

## 2022-03-24 ENCOUNTER — Ambulatory Visit (INDEPENDENT_AMBULATORY_CARE_PROVIDER_SITE_OTHER): Payer: Medicare HMO | Admitting: Physician Assistant

## 2022-03-24 ENCOUNTER — Encounter: Payer: Self-pay | Admitting: Physician Assistant

## 2022-03-24 VITALS — BP 160/73 | HR 66 | Temp 98.0°F | Wt 196.0 lb

## 2022-03-24 DIAGNOSIS — R102 Pelvic and perineal pain: Secondary | ICD-10-CM

## 2022-03-24 DIAGNOSIS — N760 Acute vaginitis: Secondary | ICD-10-CM

## 2022-03-24 DIAGNOSIS — B9689 Other specified bacterial agents as the cause of diseases classified elsewhere: Secondary | ICD-10-CM

## 2022-03-24 LAB — WET PREP FOR TRICH, YEAST, CLUE
Clue Cell Exam: NEGATIVE
Trichomonas Exam: NEGATIVE
Yeast Exam: NEGATIVE

## 2022-03-24 MED ORDER — METRONIDAZOLE 500 MG PO TABS: 500.0000 mg | ORAL_TABLET | Freq: Two times a day (BID) | ORAL | 0 refills | Status: DC

## 2022-03-24 NOTE — Telephone Encounter (Signed)
  Chief Complaint: medication request- feels BV has returned Symptoms: burning, pain in vagina Frequency: started yesterday Pertinent Negatives: Patient denies discharge, itching Disposition: '[]'$ ED /'[]'$ Urgent Care (no appt availability in office) / '[]'$ Appointment(In office/virtual)/ '[]'$  [place] [facility]/ '[]'$ Home Care/ '[]'$ Refused Recommended Disposition /'[]'$  Mobile Bus/ '[x]'$  Follow-up with PCP Additional Notes: Patient is requesting re treatment for BV- discussed that this could be yeast- but patient does not feel that is the case- she thinks the BV never cleared completely and would like to try treatment for that again. Patient states she took AZO and that helped the burning/pain- but she states her symptoms are not urinary. Advised patient she may need appointment- but she states she has a lot to do today and asked if I could send message first for provider review

## 2022-03-24 NOTE — Progress Notes (Signed)
Acute Office Visit   Patient: Jaime Freeman   DOB: Dec 31, 1955   66 y.o. Female  MRN: [id number] Visit Date: 03/24/2022  Today's healthcare provider: Dani Gobble Rydan Gulyas, PA-C  Introduced myself to the patient as a Journalist, newspaper and provided education on APPs in clinical practice.    Chief Complaint  Patient presents with   Vaginal Pain    Patient states she is having vaginal pain and some discomfort when she sits. States it feels the exact same way as when she had BV 2 weeks ago.    Subjective    HPI HPI     Vaginal Pain    Additional comments: Patient states she is having vaginal pain and some discomfort when she sits. States it feels the exact same way as when she had BV 2 weeks ago.       Last edited by Louanna Raw, [place] on 03/24/2022 11:37 AM.        Vaginal discomfort Reports vaginal pain and discomfort while sitting  Onset: sudden Duration: yesterday  Was treated for BV with metronidazole on 03/11/22  Reports when she performed swab it felt swollen and dry   States pain is similar to what she felt two weeks ago  States she also feels a bit bloated      Medications: Outpatient Medications Prior to Visit  Medication Sig   albuterol (PROVENTIL) (2.5 MG/3ML) 0.083% nebulizer solution Take 3 mLs (2.5 mg total) by nebulization every 6 (six) hours as needed for wheezing or shortness of breath.   albuterol (VENTOLIN HFA) 108 (90 Base) MCG/ACT inhaler Inhale 2 puffs into the lungs every 4 (four) hours as needed for wheezing or shortness of breath.   Ascorbic Acid (VITAMIN C PO) Take by mouth.   aspirin EC 81 MG tablet Take 81 mg by mouth daily.   benazepril (LOTENSIN) 20 MG tablet Take 1 tablet (20 mg total) by mouth daily.   bisacodyl (DULCOLAX) 5 MG EC tablet Take 5 mg by mouth daily as needed for moderate constipation.   clonazePAM (KLONOPIN) 0.5 MG tablet Take 1 tablet (0.5 mg total) by mouth 2 (two) times daily as needed for anxiety.   etodolac (LODINE) 400 MG  tablet Take 1 tablet (400 mg total) by mouth 2 (two) times daily as needed.   LORazepam (ATIVAN) 1 MG tablet Take 1 mg by mouth 2 (two) times daily.   Multiple Vitamin (MULTI VITAMIN PO) Take by mouth.   Multiple Vitamins-Minerals (ZINC PO) Take by mouth.   mupirocin ointment (BACTROBAN) 2 % Apply 1 Application topically 2 (two) times daily.   nystatin ointment (MYCOSTATIN) Apply 1 Application topically 2 (two) times daily.   omeprazole (PRILOSEC) 20 MG capsule Take 1 capsule (20 mg total) by mouth daily.   QUEtiapine (SEROQUEL) 25 MG tablet TAKE ONE TO TWO TABLETS BY MOUTH DAILY AT BEDTIME. MAY TAKE ADDITIONAL ONE-HALF TABLET UP TO THREE TIMES DAILY AS NEEDED FOR ANXIETY   senna (SENOKOT) 8.6 MG tablet Take 1 tablet by mouth daily. Take 2 tablets at night   triamcinolone ointment (KENALOG) 0.5 % Apply 1 Application topically 2 (two) times daily.   [DISCONTINUED] metroNIDAZOLE (FLAGYL) 500 MG tablet Take 1 tablet (500 mg total) by mouth 2 (two) times daily. (Patient not taking: Reported on 03/24/2022)   Facility-Administered Medications Prior to Visit  Medication Dose Route Frequency Provider   triamcinolone acetonide (KENALOG-40) injection 40 mg  40 mg Intramuscular Once [employer] P, DO  Review of Systems  Constitutional:  Negative for chills, fatigue and fever.  Genitourinary:  Positive for vaginal discharge and vaginal pain. Negative for dysuria, flank pain, hematuria, pelvic pain and vaginal bleeding.       Objective    BP (!) 160/73   Pulse 66   Temp 98 F (36.7 C)   Wt 196 lb (88.9 kg)   SpO2 99%   BMI 35.85 kg/m    Physical Exam Vitals reviewed.  Constitutional:      General: She is awake.     Appearance: Normal appearance. She is well-developed and well-groomed.  HENT:     Head: Normocephalic and atraumatic.  Pulmonary:     Effort: Pulmonary effort is normal.  Musculoskeletal:     Cervical back: Normal range of motion.  Neurological:     Mental Status:  She is alert.  Psychiatric:        Attention and Perception: Attention and perception normal.        Mood and Affect: Affect normal.        Speech: Speech normal.        Behavior: Behavior normal. Behavior is cooperative.      No results found for any visits on 03/24/22.  Assessment & Plan      No follow-ups on file.      Problem List Items Addressed This Visit   None Visit Diagnoses     Vaginal pain    -  Primary Acute, recurrent concern Reports pain is the same as it was when she was diagnosed with BV about 2 weeks ago  Wet prep was negative for clue cells, trichomonas, yeast at this time but I am skeptical of swab quality as she states vaginal cavity was swollen and tender while completing Will provide course of Flagyl to assist with symptom resolution Follow up as needed for persistent or progressing symptoms- may need pelvic exam for more thorough evaluation     Relevant Medications   metroNIDAZOLE (FLAGYL) 500 MG tablet   Other Relevant Orders   WET PREP FOR [place], YEAST, CLUE (Completed)   BV (bacterial vaginosis)        Relevant Medications   metroNIDAZOLE (FLAGYL) 500 MG tablet        No follow-ups on file.   I, Nathalee Smarr E Rozetta Stumpp, PA-C, have reviewed all documentation for this visit. The documentation on 03/26/22 for the exam, diagnosis, procedures, and orders are all accurate and complete.   Talitha Givens, MHS, PA-C [facility]

## 2022-03-24 NOTE — Telephone Encounter (Signed)
Summary: recently diagnosed with bacterial vaginosis, and she feels that it is coming back.   Pt stated that she was recently diagnosed with bacterial vaginosis, and she feels that it is coming back. Pt stated current symptoms burning and pain inside the vaginal area. Pt stated she feels bloated at times but no abdominal pain.  Pt is requesting another round of medication to clear started again yesterday or the day before.  Pt seeking clinical advice.         Reason for Disposition . [1] MILD-MODERATE pain AND [2] present > 24 hours  (Exception: Chronic pain.)  Answer Assessment - Initial Assessment Questions 1. SYMPTOM: "What's the main symptom you're concerned about?" (e.g., pain, itching, dryness)     Burning and pain- does have pressure- AZO did help her symptoms 2. LOCATION: "Where is the  burning located?" (e.g., inside/outside, left/right)     Same as previous- in vagina 3. ONSET: "When did the  burning  start?"     Yesterday- finished antibiotic last week 4. PAIN: "Is there any pain?" If Yes, ask: "How bad is it?" (Scale: 1-10; mild, moderate, severe)   -  MILD (1-3): Doesn't interfere with normal activities.    -  MODERATE (4-7): Interferes with normal activities (e.g., work or school) or awakens from sleep.     -  SEVERE (8-10): Excruciating pain, unable to do any normal activities.     Pain is in vagina- pain is better since she took AZO 5. ITCHING: "Is there any itching?" If Yes, ask: "How bad is it?" (Scale: 1-10; mild, moderate, severe)     no 6. CAUSE: "What do you think is causing the discharge?" "Have you had the same problem before? What happened then?"     No- patient thinks BV is not completely gone 7. OTHER SYMPTOMS: "Do you have any other symptoms?" (e.g., fever, itching, vaginal bleeding, pain with urination, injury to genital area, vaginal foreign body)     no  Protocols used: Vaginal Symptoms-A-AH

## 2022-03-31 ENCOUNTER — Telehealth: Payer: Self-pay | Admitting: Family Medicine

## 2022-03-31 NOTE — Telephone Encounter (Signed)
Copied from [place] [id number]. Topic: General - Other >> Mar 31, 2022  4:06 PM [id number] J wrote: Reason for CRM: pt called in for assistance. Pt was seen on 11/29 but unable to give a urine sample. Pt says that she is still having some of the same sx such as the frequent urge to urinate and also pressure. Pt would like to know if she is able to provide a urine for retesting?   Please assist pt further.

## 2022-04-01 NOTE — Telephone Encounter (Signed)
Pt following up b/c she has not heard anything back.  She is wanting to proceed w/ a UA sample for testing. Pt states here is the weekend and she will have to hurt all weekend long.

## 2022-04-01 NOTE — Telephone Encounter (Signed)
Patient was treated for BV at previous visit, not UTI as she was pretty sure her symptoms were caused by another bacterial vaginosis infection and she was sent a round of Flagyl. If she is concerned for UTI at this time she should come in for an apt for UA and repeat wet prep/ pelvic exam.

## 2022-04-01 NOTE — Telephone Encounter (Signed)
Pt checking status of return call regarding urine retesting  Please fu w/ pt

## 2022-04-02 ENCOUNTER — Telehealth (INDEPENDENT_AMBULATORY_CARE_PROVIDER_SITE_OTHER): Payer: Medicare HMO | Admitting: Nurse Practitioner

## 2022-04-02 ENCOUNTER — Encounter: Payer: Self-pay | Admitting: Nurse Practitioner

## 2022-04-02 DIAGNOSIS — N3000 Acute cystitis without hematuria: Secondary | ICD-10-CM | POA: Diagnosis not present

## 2022-04-02 DIAGNOSIS — R8281 Pyuria: Secondary | ICD-10-CM

## 2022-04-02 MED ORDER — AMOXICILLIN 500 MG PO CAPS: 500.0000 mg | ORAL_CAPSULE | Freq: Two times a day (BID) | ORAL | 0 refills | Status: AC

## 2022-04-02 NOTE — Telephone Encounter (Signed)
Please see Erin's message and see if patient would like to come in for repeat exam and labs

## 2022-04-02 NOTE — Telephone Encounter (Signed)
Pt scheduled with Santiago Glad

## 2022-04-02 NOTE — Progress Notes (Signed)
There were no vitals taken for this visit.   Subjective:    Patient ID: Jaime Freeman, female    DOB: 20-Apr-1956, 66 y.o.   MRN: [id number]  HPI: Jaime Freeman is a 66 y.o. female  Chief Complaint  Patient presents with   Urinary Tract Infection    Pt states she has been having pain and pressure with urinating, also urgency   URINARY SYMPTOMS Patient states her symptoms started a month ago.   Dysuria: no (sore in the vaginal area) Urinary frequency: yes Urgency: yes Small volume voids: no Symptom severity: no Urinary incontinence: no Foul odor: no Hematuria: no Abdominal pain: no Back pain: yes Suprapubic pain/pressure: yes Flank pain: no Fever:  no Vomiting: no Relief with cranberry juice: no Relief with pyridium: no Status: stable   Relevant past medical, surgical, family and social history reviewed and updated as indicated. Interim medical history since our last visit reviewed. Allergies and medications reviewed and updated.  Review of Systems  Constitutional:  Negative for fever.  Gastrointestinal:  Positive for abdominal pain. Negative for vomiting.  Genitourinary:  Positive for dysuria, flank pain, frequency and urgency. Negative for decreased urine volume and hematuria.  Musculoskeletal:  Negative for back pain.    Per HPI unless specifically indicated above     Objective:    There were no vitals taken for this visit.  Wt Readings from Last 3 Encounters:  03/24/22 196 lb (88.9 kg)  03/11/22 196 lb 4.8 oz (89 kg)  02/19/22 198 lb 8 oz (90 kg)    Physical Exam Vitals and nursing note reviewed.  Pulmonary:     Effort: Pulmonary effort is normal. No respiratory distress.  Neurological:     Mental Status: She is alert.  Psychiatric:        Mood and Affect: Mood normal.        Behavior: Behavior normal.        Thought Content: Thought content normal.        Judgment: Judgment normal.     Results for orders placed or performed in visit on  03/24/22  WET PREP FOR [place], YEAST, CLUE   Specimen: Sterile Swab   Sterile Swab  Result Value Ref Range   Trichomonas Exam Negative Negative   Yeast Exam Negative Negative   Clue Cell Exam Negative Negative      Assessment & Plan:   Problem List Items Addressed This Visit   None Visit Diagnoses     Acute cystitis without hematuria    -  Primary   No UA in office. Will send for culture. Will treat with Amoxicillin (pts choice. Complete course of antibiotics. Will make further recommendations based on lab.   Pyuria       Relevant Orders   Urine Culture        Follow up plan: Return if symptoms worsen or fail to improve.   This visit was completed via MyChart due to the restrictions of the COVID-19 pandemic. All issues as above were discussed and addressed. Physical exam was done as above through visual confirmation on MyChart. If it was felt that the patient should be evaluated in the office, they were directed there. The patient verbally consented to this visit. Location of the patient: Home Location of the provider: Office Those involved with this call:  Provider: Jon Billings, NP CMA: Yvonna Alanis, [place] Desk/Registration: Lynnell Catalan This encounter was conducted via phone.  I spent 21 dedicated to the care of  this patient on the date of this encounter to include previsit review of symptoms, medications, plan of care and labs, face to face time with the patient, and post visit ordering of testing.

## 2022-04-05 ENCOUNTER — Telehealth: Payer: Self-pay | Admitting: Family Medicine

## 2022-04-05 LAB — URINE CULTURE

## 2022-04-05 NOTE — Telephone Encounter (Signed)
Pt missed a call from the office / please call pt and advised

## 2022-04-05 NOTE — Telephone Encounter (Signed)
Called and spoke to patient to get clarification. Explained that Augmentin has Amoxicillin in it. Patient states that Augmentin is the only thing that she can take.

## 2022-04-05 NOTE — Telephone Encounter (Signed)
Augmentin is stronger than amoxicillin. I sent the same prescription as she has had in the past.

## 2022-04-05 NOTE — Telephone Encounter (Signed)
Pt called back to report that she is allergic to amoxicillin and that she needs augmentin called in. Just had an [place], [address] AT [address]  [place] [address]  Phone: [phone] Fax: [phone]

## 2022-04-05 NOTE — Telephone Encounter (Signed)
Please let patient know that Augmentin is amoxicillin.

## 2022-04-05 NOTE — Telephone Encounter (Signed)
Patient is aware of this, I called to clarify with her previously. Patient stated she cannot take full strength amoxicillin.

## 2022-04-06 ENCOUNTER — Ambulatory Visit: Payer: Self-pay | Admitting: *Deleted

## 2022-04-06 NOTE — Telephone Encounter (Signed)
Pt following up on Rx for augmentin.  Pt states she is still symptomatic, and needs assistance w/ getting her abx.  Transferred to NT for assistance.

## 2022-04-06 NOTE — Telephone Encounter (Signed)
Pt called bac seeking augmentin. Explaining all of her low tolerance to medication.

## 2022-04-06 NOTE — Telephone Encounter (Signed)
See result note.  

## 2022-04-06 NOTE — Progress Notes (Signed)
Please let patient know that she does not have a UTI.  She does not need to take the antibiotic.

## 2022-04-06 NOTE — Telephone Encounter (Signed)
  Chief Complaint: vaginal irritation and pain, called requesting different antibiotic. See result note from K. Holdsworth, NP Symptoms: burning at opening of vagina. Mild itching. Bottom of stomach feels swollen at times until having BM.  Frequency: greater than 1 month  Pertinent Negatives: Patient denies urinary pain no severe itching no fever. No discharge. Disposition: '[]'$ ED /'[]'$ Urgent Care (no appt availability in office) / '[x]'$ Appointment(In office/virtual)/ '[]'$  [place] [facility]/ '[]'$ Home Care/ '[]'$ Refused Recommended Disposition /'[]'$ [place] Mobile [facility]/ '[]'$  Follow-up with PCP Additional Notes:   Reviewed message from K. Mathis Dad, NP from 04/06/22 patient does not have a UTI. She does not need to take the antibiotic. Patient requesting appt. Scheduled appt for 04/08/22.     Reason for Disposition  Pain in genital area is a chronic symptom (recurrent or ongoing AND present > 4 weeks)  Answer Assessment - Initial Assessment Questions 1. SYMPTOM: "What's the main symptom you're concerned about?" (e.g., pain, itching, dryness)     Burning and small amount of itching  2. LOCATION: "Where is the  vaginal  located?" (e.g., inside/outside, left/right)     Inner opening of vagina 3. ONSET: "When did the  sx  start?"     Almost 1 month ago  4. PAIN: "Is there any pain?" If Yes, ask: "How bad is it?" (Scale: 1-10; mild, moderate, severe)   -  MILD (1-3): Doesn't interfere with normal activities.    -  MODERATE (4-7): Interferes with normal activities (e.g., work or school) or awakens from sleep.     -  SEVERE (8-10): Excruciating pain, unable to do any normal activities.     Causes pain  and feels irritated 5. ITCHING: "Is there any itching?" If Yes, ask: "How bad is it?" (Scale: 1-10; mild, moderate, severe)     Mild  6. CAUSE: "What do you think is causing the discharge?" "Have you had the same problem before? What happened then?"     No discharge 7. OTHER SYMPTOMS: "Do you have any  other symptoms?" (e.g., fever, itching, vaginal bleeding, pain with urination, injury to genital area, vaginal foreign body)     Burning at times some itching to vagina bottom of stomach feels swollen at times and better after having BM 8. PREGNANCY: "Is there any chance you are pregnant?" "When was your last menstrual period?"     na  Protocols used: Vaginal Symptoms-A-AH

## 2022-04-07 ENCOUNTER — Ambulatory Visit: Payer: Medicare HMO | Admitting: Family Medicine

## 2022-04-08 ENCOUNTER — Encounter: Payer: Self-pay | Admitting: Physician Assistant

## 2022-04-08 ENCOUNTER — Ambulatory Visit (INDEPENDENT_AMBULATORY_CARE_PROVIDER_SITE_OTHER): Payer: Medicare HMO | Admitting: Physician Assistant

## 2022-04-08 VITALS — BP 137/66 | HR 68 | Temp 98.3°F | Ht 62.01 in | Wt 201.4 lb

## 2022-04-08 DIAGNOSIS — N952 Postmenopausal atrophic vaginitis: Secondary | ICD-10-CM

## 2022-04-08 MED ORDER — ESTROGENS CONJUGATED 0.625 MG/GM VA CREA: 1.0000 | TOPICAL_CREAM | Freq: Every day | VAGINAL | 5 refills | Status: DC

## 2022-04-08 NOTE — Progress Notes (Signed)
Acute Office Visit   Patient: Jaime Freeman   DOB: 08/06/1955   66 y.o. Female  MRN: [id number] Visit Date: 04/08/2022  Today's healthcare provider: Dani Gobble Nastacia Raybuck, PA-C  Introduced myself to the patient as a Journalist, newspaper and provided education on APPs in clinical practice.    Chief Complaint  Patient presents with   Vaginal irration    Seems to be better at this time. Wants to know why this is happening   Subjective    HPI HPI     Vaginal irration    Additional comments: Seems to be better at this time. Wants to know why this is happening      Last edited by Jerelene Redden, CMA on 04/08/2022 10:33 AM.       Vaginal Pain and Irritation  Reviewed results from Wet preps and UA, urine cultures since 03/11/22  States she had BV a few weeks ago - states this has improved but she continues to have some itching and twinges of discomfort  Reports her symptoms have largely improved and she is not having as much discomfort  She has stopped using soaps in her vaginal area as directed She has now started using more of a warm water flush   She did not start Amoxicillin that was sent in for her     Medications: Outpatient Medications Prior to Visit  Medication Sig   albuterol (PROVENTIL) (2.5 MG/3ML) 0.083% nebulizer solution Take 3 mLs (2.5 mg total) by nebulization every 6 (six) hours as needed for wheezing or shortness of breath.   albuterol (VENTOLIN HFA) 108 (90 Base) MCG/ACT inhaler Inhale 2 puffs into the lungs every 4 (four) hours as needed for wheezing or shortness of breath.   amoxicillin (AMOXIL) 500 MG capsule Take 1 capsule (500 mg total) by mouth 2 (two) times daily for 7 days.   Ascorbic Acid (VITAMIN C PO) Take by mouth.   aspirin EC 81 MG tablet Take 81 mg by mouth daily.   benazepril (LOTENSIN) 20 MG tablet Take 1 tablet (20 mg total) by mouth daily.   bisacodyl (DULCOLAX) 5 MG EC tablet Take 5 mg by mouth daily as needed for moderate constipation.    clonazePAM (KLONOPIN) 0.5 MG tablet Take 1 tablet (0.5 mg total) by mouth 2 (two) times daily as needed for anxiety.   etodolac (LODINE) 400 MG tablet Take 1 tablet (400 mg total) by mouth 2 (two) times daily as needed.   LORazepam (ATIVAN) 1 MG tablet Take 1 mg by mouth 2 (two) times daily.   Multiple Vitamin (MULTI VITAMIN PO) Take by mouth.   Multiple Vitamins-Minerals (ZINC PO) Take by mouth.   mupirocin ointment (BACTROBAN) 2 % Apply 1 Application topically 2 (two) times daily.   nystatin ointment (MYCOSTATIN) Apply 1 Application topically 2 (two) times daily.   omeprazole (PRILOSEC) 20 MG capsule Take 1 capsule (20 mg total) by mouth daily.   QUEtiapine (SEROQUEL) 25 MG tablet TAKE ONE TO TWO TABLETS BY MOUTH DAILY AT BEDTIME. MAY TAKE ADDITIONAL ONE-HALF TABLET UP TO THREE TIMES DAILY AS NEEDED FOR ANXIETY   senna (SENOKOT) 8.6 MG tablet Take 1 tablet by mouth daily. Take 2 tablets at night   triamcinolone ointment (KENALOG) 0.5 % Apply 1 Application topically 2 (two) times daily.   Facility-Administered Medications Prior to Visit  Medication Dose Route Frequency Provider   triamcinolone acetonide (KENALOG-40) injection 40 mg  40 mg Intramuscular Once Park Liter  P, DO    Review of Systems  Constitutional:  Negative for chills, fatigue and fever.  Genitourinary:  Positive for vaginal pain. Negative for decreased urine volume, difficulty urinating, dysuria, enuresis, frequency, hematuria, menstrual problem, urgency, vaginal bleeding and vaginal discharge.       Objective    BP 137/66   Pulse 68   Temp 98.3 F (36.8 C)   Ht 5' 2.01" (1.575 m)   Wt 201 lb 6.4 oz (91.4 kg)   SpO2 99%   BMI 36.83 kg/m    Physical Exam Vitals reviewed. Exam conducted with a chaperone present.  Constitutional:      General: She is awake.     Appearance: Normal appearance. She is well-developed and well-groomed.  Genitourinary:    Labia:        Right: Tenderness present.        Left:  Tenderness present.      Vagina: Tenderness present. No vaginal discharge, erythema, bleeding or lesions.     Comments: Unable to fully visualize cervix due to discomfort from pelvic exam Scant vaginal discharge noted, vulval area appears dry and irritated with slight erythema  Evidence of vulvovaginal atrophy on exam Neurological:     General: No focal deficit present.     Mental Status: She is alert and oriented to person, place, and time.     GCS: GCS eye subscore is 4. GCS verbal subscore is 5. GCS motor subscore is 6.  Psychiatric:        Attention and Perception: Attention and perception normal.        Mood and Affect: Mood and affect normal.        Speech: Speech normal.        Behavior: Behavior normal. Behavior is cooperative.       No results found for any visits on 04/08/22.  Assessment & Plan      No follow-ups on file.      Problem List Items Addressed This Visit   None Visit Diagnoses     Vaginal atrophy    -  Primary Likely acute on chronic at this time Reviewed causes and symptoms of vaginal atrophy with patient and answered her questions Discussed drinking more water, using Premarin cream to assist with symptoms Recommend daily use for at least 4 weeks then she can decrease to alternate days for maintenance  Follow up as needed for persistent or progressing symptoms    Relevant Medications   conjugated estrogens (PREMARIN) vaginal cream        No follow-ups on file.   I, Jagdeep Ancheta E Neo Yepiz, PA-C, have reviewed all documentation for this visit. The documentation on 04/08/22 for the exam, diagnosis, procedures, and orders are all accurate and complete.   Talitha Givens, MHS, PA-C [facility]

## 2022-04-23 ENCOUNTER — Ambulatory Visit: Payer: Self-pay

## 2022-04-23 NOTE — Telephone Encounter (Signed)
Message from Roslynn Amble sent at 04/23/2022 10:06 AM EST  Summary: Sinus pressure/Sore throat   The patient called in stating she has sinus pressure and a sore throat on her right side. She has had it since last night. There are no opening appointments until Tuesday. Please assist patient further         Chief Complaint: sore throat, sinus con=gestion Symptoms: sore throat at the top of throat,  Frequency: yesterday and sore throat today (mild) Pertinent Negatives: Patient denies fever, SOB, difficulty swallowing, headache Disposition: '[]'$ ED /'[]'$ Urgent Care (no appt availability in office) / '[]'$ Appointment(In office/virtual)/ '[]'$  [facility]/ '[x]'$ Home Care/ '[]'$ Refused Recommended Disposition /'[]'$ [facility]/ '[]'$  Follow-up with PCP Additional Notes:   Reason for Disposition  [1] Sinus congestion as part of a cold AND [2] present < 10 days  Answer Assessment - Initial Assessment Questions 1. LOCATION: "Where does it hurt?"      Over eyebrow, back teeth 2. ONSET: "When did the sinus pain start?"  (e.g., hours, days)      This am  3. SEVERITY: "How bad is the pain?"   (Scale 1-10; mild, moderate or severe)   - MILD (1-3): doesn't interfere with normal activities    - MODERATE (4-7): interferes with normal activities (e.g., work or school) or awakens from sleep   - SEVERE (8-10): excruciating pain and patient unable to do any normal activities        Top of throat mild 4. RECURRENT SYMPTOM: "Have you ever had sinus problems before?" If Yes, ask: "When was the last time?" and "What happened that time?"      N/a 5. NASAL CONGESTION: "Is the nose blocked?" If Yes, ask: "Can you open it or must you breathe through your mouth?"     Post nasal drainage 6. NASAL DISCHARGE: "Do you have discharge from your nose?" If so ask, "What color?"     Yes - have not noted color 7. FEVER: "Do you have a fever?" If Yes, ask: "What is it, how was it measured, and when did it start?"       no 8. OTHER SYMPTOMS: "Do you have any other symptoms?" (e.g., sore throat, cough, earache, difficulty breathing)     Sore throat, burning in nasal area 9. PREGNANCY: "Is there any chance you are pregnant?" "When was your last menstrual period?"     N/a  Protocols used: Sinus Pain or Congestion-A-AH

## 2022-04-30 ENCOUNTER — Ambulatory Visit: Payer: Self-pay | Admitting: Allergy

## 2022-05-28 ENCOUNTER — Encounter: Payer: Self-pay | Admitting: Allergy

## 2022-05-28 ENCOUNTER — Other Ambulatory Visit: Payer: Self-pay

## 2022-05-28 ENCOUNTER — Ambulatory Visit: Payer: Medicare HMO | Admitting: Allergy

## 2022-05-28 VITALS — BP 130/78 | HR 64 | Temp 98.0°F | Resp 16 | Ht 61.0 in | Wt 205.4 lb

## 2022-05-28 DIAGNOSIS — J310 Chronic rhinitis: Secondary | ICD-10-CM

## 2022-05-28 DIAGNOSIS — L508 Other urticaria: Secondary | ICD-10-CM | POA: Diagnosis not present

## 2022-05-28 DIAGNOSIS — L299 Pruritus, unspecified: Secondary | ICD-10-CM | POA: Diagnosis not present

## 2022-05-28 DIAGNOSIS — F319 Bipolar disorder, unspecified: Secondary | ICD-10-CM | POA: Diagnosis not present

## 2022-05-28 NOTE — Progress Notes (Signed)
New Patient Note  RE: Jaime Freeman MRN: [id number] DOB: 03/24/1956 Date of Office Visit: 05/28/2022   Primary care provider: Valerie Roys, DO  Chief Complaint: hives  History of present illness: Jaime Freeman is a 67 y.o. female presenting today for evaluation of hives.  She presents today with her daughter-in-law.   She states she has been breaking out with welts on her arms, legs, abdomen.  The rash is itchy all the time.  The rash started about 6 months.  She states she was having the rash pretty much daily but has lessened a bit this week.  No associated swelling.  The welts can last all day.  Not leaving any bruising.  She did change her soaps/detergents.  No fevers/sweats/chills.  She states she did try to avoid some foods but nothing changed in her welts she still had them anywhere.  She has not tried any medications/ointment to treat the hives. No preceding illness. No medication changes or dose adjustments prior to onset.  No bites or stings.    She states she has congestion/sinus pressure and can have a voice changes often.  She states she has used Afrin and flonase.  She has taken Claritin in the past and states it did seem to make her kind of groggy when trying to come off of it.     Review of systems: Review of Systems  Constitutional: Negative.   HENT:  Positive for congestion and sinus pressure.   Eyes: Negative.   Respiratory: Negative.    Cardiovascular: Negative.   Gastrointestinal: Negative.   Musculoskeletal: Negative.   Skin:  Positive for rash.  Allergic/Immunologic: Negative.   Neurological: Negative.     All other systems negative unless noted above in HPI  Past medical history: Past Medical History:  Diagnosis Date   Allergic rhinitis    Angio-edema    Anxiety    Bipolar affective disorder ([place])    Depression    Hiatal hernia    Hypertension    IFG (impaired fasting glucose)    Lithium toxicity 11/10/2017   Menopausal state    Morbid  obesity ([facility])    OCD (obsessive compulsive disorder)    Panic disorder    Restrictive lung disease    Urticaria    Vitamin B12 deficiency    Vitamin D deficiency disease     Past surgical history: Past Surgical History:  Procedure Laterality Date   CESAREAN SECTION     DILATION AND CURETTAGE OF UTERUS     TONSILLECTOMY      Family history:  Family History  Problem Relation Age of Onset   Heart disease Mother    Heart attack Mother    Hypertension Mother    Cancer Father        GI tract   Hypertension Father    Cancer Maternal Grandmother        gallbladder   Heart disease Maternal Grandfather    COPD Neg Hx    Diabetes Neg Hx    Stroke Neg Hx     Social history: Lives in a home with carpeting in the bedroom with gas heating and central cooling.  Her home she has a dog.  Dog, cat, turtles, birds, rabbit in her sons home and she spends the night there.  She is retired.   Tobacco Use   Smoking status: Former    Types: Cigarettes    Quit date: 04/27/1987    Years since quitting: 30.1  Smokeless tobacco: Never  Vaping Use   Vaping Use: Never used     Medication List: Current Outpatient Medications  Medication Sig Dispense Refill   albuterol (PROVENTIL) (2.5 MG/3ML) 0.083% nebulizer solution Take 3 mLs (2.5 mg total) by nebulization every 6 (six) hours as needed for wheezing or shortness of breath. 150 mL 1   albuterol (VENTOLIN HFA) 108 (90 Base) MCG/ACT inhaler Inhale 2 puffs into the lungs every 4 (four) hours as needed for wheezing or shortness of breath. 1 each 3   Ascorbic Acid (VITAMIN C PO) Take by mouth.     aspirin EC 81 MG tablet Take 81 mg by mouth daily.     benazepril (LOTENSIN) 20 MG tablet Take 1 tablet (20 mg total) by mouth daily. 90 tablet 1   bisacodyl (DULCOLAX) 5 MG EC tablet Take 5 mg by mouth daily as needed for moderate constipation.     clonazePAM (KLONOPIN) 0.5 MG tablet Take 1 tablet (0.5 mg total) by mouth 2 (two) times daily as needed for  anxiety. 20 tablet 0   conjugated estrogens (PREMARIN) vaginal cream Place 1 Applicatorful vaginally at bedtime. 30 g 5   etodolac (LODINE) 400 MG tablet Take 1 tablet (400 mg total) by mouth 2 (two) times daily as needed. 180 tablet 1   LORazepam (ATIVAN) 1 MG tablet Take 1 mg by mouth 2 (two) times daily.     Multiple Vitamin (MULTI VITAMIN PO) Take by mouth.     Multiple Vitamins-Minerals (ZINC PO) Take by mouth.     mupirocin ointment (BACTROBAN) 2 % Apply 1 Application topically 2 (two) times daily. 22 g 0   nystatin ointment (MYCOSTATIN) Apply 1 Application topically 2 (two) times daily. 30 g 3   omeprazole (PRILOSEC) 20 MG capsule Take 1 capsule (20 mg total) by mouth daily. 90 capsule 1   QUEtiapine (SEROQUEL) 25 MG tablet TAKE ONE TO TWO TABLETS BY MOUTH DAILY AT BEDTIME. MAY TAKE ADDITIONAL ONE-HALF TABLET UP TO THREE TIMES DAILY AS NEEDED FOR ANXIETY 315 tablet 1   senna (SENOKOT) 8.6 MG tablet Take 1 tablet by mouth daily. Take 2 tablets at night     triamcinolone ointment (KENALOG) 0.5 % Apply 1 Application topically 2 (two) times daily. 30 g 0   Current Facility-Administered Medications  Medication Dose Route Frequency Provider Last Rate Last Admin   triamcinolone acetonide (KENALOG-40) injection 40 mg  40 mg Intramuscular Once Johnson, Megan P, DO        Known medication allergies: Allergies  Allergen Reactions   Codeine Other (See Comments)   Dynacirc [Isradipine]    Guaifenesin & Derivatives    Influenza Vaccines    Other Other (See Comments)   Sudafed [Pseudoephedrine]    Zithromax [Azithromycin]    Prednisone Anxiety    Anxiousness     Physical examination: Blood pressure 130/78, pulse 64, temperature 98 F (36.7 C), resp. rate 16, height '5\' 1"'$  (1.549 m), weight 205 lb 6.4 oz (93.2 kg), SpO2 100 %.  General: Alert, interactive, in no acute distress. HEENT: PERRLA, TMs pearly gray, turbinates minimally edematous without discharge, post-pharynx non  erythematous. Neck: Supple without lymphadenopathy. Lungs: Clear to auscultation without wheezing, rhonchi or rales. {no increased work of breathing. CV: Normal S1, S2 without murmurs. Abdomen: Nondistended, nontender. Skin: Warm and dry, without lesions or rashes. Extremities:  No clubbing, cyanosis or edema. Neuro:   Grossly intact.  Diagnositics/Labs: Labs:  Component     Latest Ref Rng 12/08/2021  WBC  3.4 - 10.8 x10E3/uL 6.8   RBC     3.77 - 5.28 x10E6/uL 3.84   Hemoglobin     11.1 - 15.9 g/dL 13.0   HCT     34.0 - 46.6 % 36.1   MCV     79 - 97 fL 94   MCH     26.6 - 33.0 pg 33.9 (H)   MCHC     31.5 - 35.7 g/dL 36.0 (H)   RDW     11.7 - 15.4 % 11.4 (L)   Platelets     150 - 450 x10E3/uL 312   Neutrophils     Not Estab. % 60   Lymphs     Not Estab. % 32   Monocytes     Not Estab. % 6   Eos     Not Estab. % 1   Basos     Not Estab. % 1   NEUT#     1.4 - 7.0 x10E3/uL 4.1   Lymphocyte #     0.7 - 3.1 x10E3/uL 2.2   Monocytes Absolute     0.1 - 0.9 x10E3/uL 0.4   EOS (ABSOLUTE)     0.0 - 0.4 x10E3/uL 0.1   Basophils Absolute     0.0 - 0.2 x10E3/uL 0.0   Immature Granulocytes     Not Estab. % 0   Immature Grans (Abs)     0.0 - 0.1 x10E3/uL 0.0   Glucose     70 - 99 mg/dL 88   BUN     8 - 27 mg/dL 14   Creatinine     0.57 - 1.00 mg/dL 0.73   eGFR     >59 mL/min/1.73 91   BUN/Creatinine Ratio     12 - 28  19   Sodium     134 - 144 mmol/L 143   Potassium     3.5 - 5.2 mmol/L 5.0   Chloride     96 - 106 mmol/L 107 (H)   CO2     20 - 29 mmol/L 23   Calcium     8.7 - 10.3 mg/dL 9.7   Total Protein     6.0 - 8.5 g/dL 6.5   Albumin     3.9 - 4.9 g/dL 4.3   Globulin, Total     1.5 - 4.5 g/dL 2.2   Albumin/Globulin Ratio     1.2 - 2.2  2.0   Total Bilirubin     0.0 - 1.2 mg/dL 0.2   Alkaline Phosphatase     44 - 121 IU/L 75   AST     0 - 40 IU/L 17   ALT     0 - 32 IU/L 10     Assessment and plan: Chronic Urticaria Pruritus  - at  this time etiology of hives and swelling is unknown.  Hives can be caused by a variety of different triggers including illness/infection, foods, medications, stings, exercise, pressure, vibrations, extremes of temperature to name a few however majority of the time there is no identifiable trigger.  Your symptoms have been ongoing for >6 weeks making this chronic thus will obtain labwork to evaluate: tryptase, hive panel, environmental panel, rabbit IgE, bird feather IgE, alpha-gal panel  - for hive control recommend antihistamine regimen: Allegra '180mg'$  1 tab with Pepcid '20mg'$  1 tab.   Other options besides Allegra would be Xyzal, Zyrtec or Claritin.   If daily dosing of both medications is not enough to  control hives then increase both to twice a day dosing.   If twice a day dosing is not effective enough then will consider starting Xolair monthly injections for chronic spontaneous hives.  Will discuss this further if needed in future  Rhinitis - environmental allergy panel will show if you have allergies to environmental things like pollens, mold, dust mite, animals, cockroach etc - antihistamine above will be helpful for nasal allergy symptoms - use Afrin only if unable to breathe through the nose.  If using Afrin use no more than 3-5 days in a row.  Always follow Afrin use with a nasal steroid spray like Flonase, Rhinocort or Nasacort which are maintenance sprays for congestion  Follow-up in 2 months or sooner if needed  I appreciate the opportunity to take part in Marylu's care. Please do not hesitate to contact me with questions.  Sincerely,   Prudy Feeler, MD Allergy/Immunology [facility]

## 2022-05-28 NOTE — Patient Instructions (Addendum)
-   at this time etiology of hives and swelling is unknown.  Hives can be caused by a variety of different triggers including illness/infection, foods, medications, stings, exercise, pressure, vibrations, extremes of temperature to name a few however majority of the time there is no identifiable trigger.  Your symptoms have been ongoing for >6 weeks making this chronic thus will obtain labwork to evaluate: tryptase, hive panel, environmental panel, rabbit IgE, bird feather IgE, alpha-gal panel  - for hive control recommend antihistamine regimen: Allegra '180mg'$  1 tab with Pepcid '20mg'$  1 tab.   Other options besides Allegra would be Xyzal, Zyrtec or Claritin.   If daily dosing of both medications is not enough to control hives then increase both to twice a day dosing.   If twice a day dosing is not effective enough then will consider starting Xolair monthly injections for chronic spontaneous hives.  Will discuss this further if needed in future  - environmental allergy panel will show if you have allergies to environmental things like pollens, mold, dust mite, animals, cockroach etc - antihistamine above will be helpful for nasal allergy symptoms - use Afrin only if unable to breathe through the nose.  If using Afrin use no more than 3-5 days in a row.  Always follow Afrin use with a nasal steroid spray like Flonase, Rhinocort or Nasacort which are maintenance sprays for congestion  Follow-up in 2 months or sooner if needed

## 2022-06-04 LAB — THYROID ANTIBODIES
Thyroglobulin Antibody: <1 IU/mL (ref 0.0–0.9)
Thyroperoxidase Ab SerPl-aCnc: 10 IU/mL (ref 0–34)

## 2022-06-04 LAB — ALLERGENS W/TOTAL IGE AREA 2

## 2022-06-04 LAB — ALPHA-GAL PANEL
Allergen Lamb IgE: <0.1 kU/L
Beef IgE: <0.1 kU/L
IgE (Immunoglobulin E), Serum: 23 IU/mL (ref 6–495)
O215-IgE Alpha-Gal: <0.1 kU/L
Pork IgE: <0.1 kU/L

## 2022-06-04 LAB — ALLERGEN, COCKATIEL FEATHERS, IGE
Class Interpretation: 0
Cockatiel Feathers IgE: <0.35 kU/L (ref <0.35)

## 2022-06-04 LAB — CHRONIC URTICARIA: cu index: <1.1 (ref <10)

## 2022-06-04 LAB — TSH: TSH: 0.668 u[IU]/mL (ref 0.450–4.500)

## 2022-06-04 LAB — ALLERGEN, CHICKEN FEATHER, E85: Chicken Feathers IgE: <0.1 kU/L

## 2022-06-04 LAB — TRYPTASE: Tryptase: 4.2 ug/L (ref 2.2–13.2)

## 2022-06-04 LAB — E082-IGE RABBIT EPITHELIA: Rabbit Epithelia IgE: <0.1 kU/L

## 2022-06-07 DIAGNOSIS — Z03818 Encounter for observation for suspected exposure to other biological agents ruled out: Secondary | ICD-10-CM | POA: Diagnosis not present

## 2022-06-07 DIAGNOSIS — M545 Low back pain, unspecified: Secondary | ICD-10-CM | POA: Diagnosis not present

## 2022-06-07 DIAGNOSIS — R0981 Nasal congestion: Secondary | ICD-10-CM | POA: Diagnosis not present

## 2022-06-07 DIAGNOSIS — U071 COVID-19: Secondary | ICD-10-CM | POA: Diagnosis not present

## 2022-06-10 ENCOUNTER — Ambulatory Visit: Payer: Medicare HMO | Admitting: Family Medicine

## 2022-07-30 ENCOUNTER — Ambulatory Visit: Payer: Medicare HMO | Admitting: Allergy

## 2022-08-09 NOTE — Progress Notes (Unsigned)
There were no vitals taken for this visit.   Subjective:    Patient ID: Jaime Freeman, female    DOB: 01-11-1956, 67 y.o.   MRN: [id number]  HPI: Jaime Freeman is a 67 y.o. female  No chief complaint on file.   Relevant past medical, surgical, family and social history reviewed and updated as indicated. Interim medical history since our last visit reviewed. Allergies and medications reviewed and updated.  Review of Systems  Per HPI unless specifically indicated above     Objective:    There were no vitals taken for this visit.  Wt Readings from Last 3 Encounters:  05/28/22 205 lb 6.4 oz (93.2 kg)  04/08/22 201 lb 6.4 oz (91.4 kg)  03/24/22 196 lb (88.9 kg)    Physical Exam  Results for orders placed or performed in visit on 05/28/22  Alpha-Gal Panel  Result Value Ref [place]   Class Description Allergens Comment    IgE (Immunoglobulin E), Serum 23 6 - 495 IU/mL   O215-[id number] Alpha-Gal <0.10 Class 0 kU/L   Beef IgE <0.10 Class 0 kU/L   Pork IgE <0.10 Class 0 kU/L   Allergen Lamb IgE <0.10 Class 0 kU/L  Tryptase  Result Value Ref Range   Tryptase 4.2 2.2 - 13.2 ug/L  Allergens w/Total IgE Area 2  Result Value Ref Range   D Pteronyssinus IgE <0.10 Class 0 kU/L   D Farinae IgE <0.10 Class 0 kU/L   Cat Dander IgE <0.10 Class 0 kU/L   Dog Dander IgE <0.10 Class 0 kU/L   [place] Grass IgE <0.10 Class 0 kU/L   Timothy Grass IgE <0.10 Class 0 kU/L   [place] Grass IgE <0.10 Class 0 kU/L   Cockroach, German IgE <0.10 Class 0 kU/L   Penicillium Chrysogen IgE <0.10 Class 0 kU/L   Cladosporium Herbarum IgE <0.10 Class 0 kU/L   Aspergillus Fumigatus IgE <0.10 Class 0 kU/L   Alternaria Alternata IgE <0.10 Class 0 kU/L   Maple/Box Elder IgE <0.10 Class 0 kU/L   Common Silver Charletta Cousin IgE <0.10 Class 0 kU/L   Cedar, [place] IgE <0.10 Class 0 kU/L   Oak, White IgE <0.10 Class 0 kU/L   Elm, American IgE <0.10 Class 0 kU/L   C[place]wood IgE <0.10 Class 0 kU/L   Pecan, Hickory IgE  <0.10 Class 0 kU/L   White Mulberry IgE <0.10 Class 0 kU/L   Ragweed, Short IgE <0.10 Class 0 kU/L   Pigweed, Rough IgE <0.10 Class 0 kU/L   Sheep Sorrel IgE Qn <0.10 Class 0 kU/L   Mouse Urine IgE <0.10 Class 0 kU/L  Thyroid antibodies  Result Value Ref Range   Thyroperoxidase Ab SerPl-aCnc 10 0 - 34 IU/mL   Thyroglobulin Antibody <1.0 0.0 - 0.9 IU/mL  Chronic Urticaria  Result Value Ref Range   cu index <1.1 <10  TSH  Result Value Ref Range   TSH 0.668 0.450 - 4.500 uIU/mL  Rabbit Epithelia IgE  Result Value Ref Range   Rabbit Epithelia IgE <0.10 Class 0 kU/L  Allergen, Chicken Feather, e85  Result Value Ref Range   Chicken Feathers IgE <0.10 Class 0 kU/L  Allergen, Cockatiel Feathers, IgE  Result Value Ref Range   Cockatiel Feathers IgE <0.35 <0.35 kU/L   Class Interpretation 0       Assessment & Plan:   Problem List Items Addressed This Visit   None    Follow up plan: No follow-ups on file.

## 2022-08-10 ENCOUNTER — Ambulatory Visit (INDEPENDENT_AMBULATORY_CARE_PROVIDER_SITE_OTHER): Payer: Medicare HMO | Admitting: Family Medicine

## 2022-08-10 ENCOUNTER — Encounter: Payer: Self-pay | Admitting: Family Medicine

## 2022-08-10 VITALS — BP 136/78 | HR 76 | Temp 98.3°F | Wt 197.5 lb

## 2022-08-10 DIAGNOSIS — J029 Acute pharyngitis, unspecified: Secondary | ICD-10-CM

## 2022-08-10 DIAGNOSIS — J3489 Other specified disorders of nose and nasal sinuses: Secondary | ICD-10-CM

## 2022-08-10 DIAGNOSIS — H6502 Acute serous otitis media, left ear: Secondary | ICD-10-CM | POA: Diagnosis not present

## 2022-08-10 LAB — RAPID STREP SCREEN (MED CTR MEBANE ONLY): Strep Gp A Ag, IA W/Reflex: NEGATIVE

## 2022-08-10 LAB — VERITOR FLU A/B WAIVED: Influenza B: NEGATIVE

## 2022-08-10 MED ORDER — AMOXICILLIN-POT CLAVULANATE 875-125 MG PO TABS
1.0000 | ORAL_TABLET | Freq: Two times a day (BID) | ORAL | 0 refills | Status: AC
Start: 2022-08-10 — End: 2022-08-17

## 2022-08-10 NOTE — Patient Instructions (Addendum)
Recommend: -Continue using Flonase as needed. - Increased rest - Increasing Fluids - Acetaminophen / ibuprofen as needed for fever/pain.  - Saline sinus flushes or a neti pot.  - Humidifying the air

## 2022-08-12 LAB — CULTURE, GROUP A STREP

## 2022-08-12 LAB — VERITOR FLU A/B WAIVED: Influenza A: NEGATIVE

## 2022-08-13 ENCOUNTER — Telehealth: Payer: Self-pay

## 2022-08-13 NOTE — Telephone Encounter (Signed)
Pt given lab results per notes of Rashelle, NP on 08/13/22. Pt verbalized understanding.  Pt states her ears are still bothering her and feeling like congestion going into chest, unable to take Mucinex OTC so is taking Allegra, advised to continue taking that and Flonase and complete abx and if no improvement to CB and will see what provider recommends further. Pt verbalized understanding.   Weber Cooks, NP 08/13/2022  8:40 AM EDT     Hi Jaime Freeman, Can you let Jaime Freeman know her strep culture came back negative. Please advise to continue taking the antibiotics for the ear infection and to let us know if her symptoms are not improving. We hope she feels better. Thank you!

## 2022-08-16 ENCOUNTER — Ambulatory Visit: Payer: Self-pay | Admitting: *Deleted

## 2022-08-16 NOTE — Telephone Encounter (Signed)
  Chief Complaint: Wanting another 7 days of the same antibiotic because she is still having a lot of yellow nasal congestion and sinus pressure and ear pressure.   Some coughing and chest congestion Symptoms: above Frequency: better  Pertinent Negatives: Patient denies fever Disposition: ED /[] Urgent Care (no appt availability in office) / Appointment(In office/virtual)/  [facility]/ Home Care/ Refused Recommended Disposition /[] [facility]/  Follow-up with PCP Additional Notes: Messag sent to Prescott Gum, FNP to see what she thinks about another antibiotic.  Pt agreeable to someone calling her back

## 2022-08-16 NOTE — Telephone Encounter (Signed)
Last night when blowing my nose it's yellow.   It's been 7 days on the antibiotics..   I feel better this week.   I'm still getting a lot of stuff out.   Yesterday I feel I still have some congestion in my chest.   My ears are still popping.    I do have a post nasal drip. Should I be on another 7 days of antibiotics?  I want to  Stay on the same medicine.   I have so many allergies.   I'm taking Zyrtec too, or whatever allergy pill I was on during my visit.   I think it was Zyrtec.      I worked in the yard last week so I know that didn't help my allergies.   Pollen really bothers me.     Mild Reason for Disposition  [1] Nasal discharge AND [2] present > 10 days    Requesting another 7 days of the same antibiotic she's been on since being seen 08/10/2022.  Answer Assessment - Initial Assessment Questions 1. LOCATION: "Where does it hurt?"      Seen on 08/10/2022 and given an antibiotic for URI.   I'm still blowing out a lot of yellow mucus from my nose.   I have post nasal drip too.  I'm still having pressure in my ears. 2. ONSET: "When did the sinus pain start?"  (e.g., hours, days)      Seen 08/10/2022. 3. SEVERITY: "How bad is the pain?"   (Scale 1-10; mild, moderate or severe)   - MILD (1-3): doesn't interfere with normal activities    - MODERATE (4-7): interferes with normal activities (e.g., work or school) or awakens from sleep   - SEVERE (8-10): excruciating pain and patient unable to do any normal activities        Moderate 4. RECURRENT SYMPTOM: "Have you ever had sinus problems before?" If Yes, ask: "When was the last time?" and "What happened that time?"      Should I still be on another 7 days of the antibiotic?   It usually takes 7 days. 5. NASAL CONGESTION: "Is the nose blocked?" If Yes, ask: "Can you open it or must you breathe through your mouth?"     I'm blowing out yellow stuff 6. NASAL DISCHARGE: "Do you have discharge from your nose?" If so ask, "What color?"     Yes 7.  FEVER: "Do you have a fever?" If Yes, ask: "What is it, how was it measured, and when did it start?"      No 8. OTHER SYMPTOMS: "Do you have any other symptoms?" (e.g., sore throat, cough, earache, difficulty breathing)     Coughing, ear pressure, chest congestion. 9. PREGNANCY: "Is there any chance you are pregnant?" "When was your last menstrual period?"     N/A  Protocols used: Sinus Pain or Congestion-A-AH

## 2022-08-23 ENCOUNTER — Ambulatory Visit (INDEPENDENT_AMBULATORY_CARE_PROVIDER_SITE_OTHER): Payer: Medicare HMO | Admitting: Family Medicine

## 2022-08-23 ENCOUNTER — Encounter: Payer: Self-pay | Admitting: Family Medicine

## 2022-08-23 VITALS — BP 113/73 | HR 80 | Temp 99.0°F | Ht 61.0 in | Wt 197.2 lb

## 2022-08-23 DIAGNOSIS — E559 Vitamin D deficiency, unspecified: Secondary | ICD-10-CM

## 2022-08-23 DIAGNOSIS — J301 Allergic rhinitis due to pollen: Secondary | ICD-10-CM

## 2022-08-23 DIAGNOSIS — N181 Chronic kidney disease, stage 1: Secondary | ICD-10-CM

## 2022-08-23 DIAGNOSIS — K760 Fatty (change of) liver, not elsewhere classified: Secondary | ICD-10-CM | POA: Diagnosis not present

## 2022-08-23 DIAGNOSIS — G63 Polyneuropathy in diseases classified elsewhere: Secondary | ICD-10-CM

## 2022-08-23 DIAGNOSIS — M7989 Other specified soft tissue disorders: Secondary | ICD-10-CM

## 2022-08-23 DIAGNOSIS — I129 Hypertensive chronic kidney disease with stage 1 through stage 4 chronic kidney disease, or unspecified chronic kidney disease: Secondary | ICD-10-CM | POA: Diagnosis not present

## 2022-08-23 DIAGNOSIS — E538 Deficiency of other specified B group vitamins: Secondary | ICD-10-CM

## 2022-08-23 DIAGNOSIS — R7301 Impaired fasting glucose: Secondary | ICD-10-CM | POA: Diagnosis not present

## 2022-08-23 DIAGNOSIS — R1310 Dysphagia, unspecified: Secondary | ICD-10-CM

## 2022-08-23 DIAGNOSIS — F319 Bipolar disorder, unspecified: Secondary | ICD-10-CM | POA: Diagnosis not present

## 2022-08-23 DIAGNOSIS — J984 Other disorders of lung: Secondary | ICD-10-CM | POA: Diagnosis not present

## 2022-08-23 DIAGNOSIS — M170 Bilateral primary osteoarthritis of knee: Secondary | ICD-10-CM

## 2022-08-23 DIAGNOSIS — G8929 Other chronic pain: Secondary | ICD-10-CM | POA: Insufficient documentation

## 2022-08-23 DIAGNOSIS — F332 Major depressive disorder, recurrent severe without psychotic features: Secondary | ICD-10-CM

## 2022-08-23 DIAGNOSIS — E785 Hyperlipidemia, unspecified: Secondary | ICD-10-CM

## 2022-08-23 DIAGNOSIS — D692 Other nonthrombocytopenic purpura: Secondary | ICD-10-CM | POA: Diagnosis not present

## 2022-08-23 MED ORDER — OMEPRAZOLE 20 MG PO CPDR: 20.0000 mg | DELAYED_RELEASE_CAPSULE | Freq: Every day | ORAL | 1 refills | Status: DC

## 2022-08-23 MED ORDER — BENAZEPRIL HCL 20 MG PO TABS: 20.0000 mg | ORAL_TABLET | Freq: Every day | ORAL | 1 refills | Status: DC

## 2022-08-23 MED ORDER — ETODOLAC 400 MG PO TABS: 400.0000 mg | ORAL_TABLET | Freq: Two times a day (BID) | ORAL | 1 refills | Status: DC | PRN

## 2022-08-23 MED ORDER — ALBUTEROL SULFATE HFA 108 (90 BASE) MCG/ACT IN AERS: 2.0000 | INHALATION_SPRAY | RESPIRATORY_TRACT | 3 refills | Status: AC | PRN

## 2022-08-23 MED ORDER — ALBUTEROL SULFATE (2.5 MG/3ML) 0.083% IN NEBU: 2.5000 mg | INHALATION_SOLUTION | Freq: Four times a day (QID) | RESPIRATORY_TRACT | 1 refills | Status: AC | PRN

## 2022-08-23 MED ORDER — FLUTICASONE PROPIONATE 50 MCG/ACT NA SUSP: 2.0000 | Freq: Every day | NASAL | 6 refills | Status: DC

## 2022-08-23 MED ORDER — QUETIAPINE FUMARATE 25 MG PO TABS: ORAL_TABLET | ORAL | 1 refills | Status: DC

## 2022-08-23 NOTE — Assessment & Plan Note (Signed)
Referral to ortho placed today. Await their input.

## 2022-08-23 NOTE — Assessment & Plan Note (Signed)
Under good control on current regimen. Continue current regimen. Continue to monitor. Call with any concerns. Refills given. Labs drawn today.   

## 2022-08-23 NOTE — Assessment & Plan Note (Signed)
Encouraged diet and exercise with goal of losing 1-2lbs per week.  ?

## 2022-08-23 NOTE — Assessment & Plan Note (Signed)
Rechecking labs today. Await results. Treat as needed.  °

## 2022-08-23 NOTE — Assessment & Plan Note (Signed)
Under good control on current regimen. Continue current regimen. Continue to monitor. Call with any concerns. Refills given.   

## 2022-08-23 NOTE — Progress Notes (Signed)
BP 113/73   Pulse 80   Temp 99 F (37.2 C) (Oral)   Ht 5\' 1"  (1.549 m)   Wt 197 lb 3.2 oz (89.4 kg)   SpO2 99%   BMI 37.26 kg/m    Subjective:    Patient ID: Jaime Freeman, female    DOB: [birth year], 67 y.o.   MRN: [id number]  HPI: Jaime Freeman is a 67 y.o. female  Chief Complaint  Patient presents with   Anxiety   Nasal Congestion    Patient says she is having a lot of congestion in her facial area. Patient says she never thought she had an ear infection and would like provider to take a look at today's visit. Patient says she has been using an OTC Nasal Spray and says she would not be able to breathe without it.   Hypertension   HYPERTENSION / HYPERLIPIDEMIA Satisfied with current treatment? yes Duration of hypertension: chronic BP monitoring frequency: not checking BP medication side effects: no Past BP meds: benazepril Duration of hyperlipidemia: chronic Cholesterol medication side effects: no Cholesterol supplements: none Past cholesterol medications: none Medication compliance: excellent compliance Aspirin: yes Recent stressors: yes Recurrent headaches: no Visual changes: no Palpitations: no Dyspnea: no Chest pain: no Lower extremity edema: no Dizzy/lightheaded: no  ANXIETY/STRESS Duration: chronic Status:exacerbated Anxious mood: yes  Excessive worrying: no Irritability: no  Sweating: no Nausea: no Palpitations:no Hyperventilation: no Panic attacks: no Agoraphobia: no  Obscessions/compulsions: no Depressed mood: no    08/23/2022    2:21 PM 03/11/2022    2:53 PM 02/19/2022    9:14 AM 01/19/2022   10:30 AM 12/29/2021   10:36 AM  Depression screen PHQ 2/9  Decreased Interest 0 0 0 0 0  Down, Depressed, Hopeless 1 0 0 1 0  PHQ - 2 Score 1 0 0 1 0  Altered sleeping 0 0 0 0 0  Tired, decreased energy 0 0 0 1 0  Change in appetite 0 0 0 0 0  Feeling bad or failure about yourself  0 0 0 0 0  Trouble concentrating 1 0 0 1 0  Moving slowly or  fidgety/restless 0 0 0 0 0  Suicidal thoughts 0 0 0 0 0  PHQ-9 Score 2 0 0 3 0  Difficult doing work/chores Somewhat difficult Not difficult at all Not difficult at all Not difficult at all Not difficult at all      08/23/2022    2:21 PM 03/11/2022    2:53 PM 02/19/2022    9:14 AM 01/19/2022   10:31 AM  GAD 7 : Generalized Anxiety Score  Nervous, Anxious, on Edge 1 1 1 1   Control/stop worrying 1 0 0 0  Worry too much - different things 1 0 0 1  Trouble relaxing 1 0 0 0  Restless 0 0 0 0  Easily annoyed or irritable 1 0 1 1  Afraid - awful might happen 1 0 0 0  Total GAD 7 Score 6 1 2 3   Anxiety Difficulty Somewhat difficult Not difficult at all Not difficult at all Not difficult at all   Anhedonia: no Weight changes: no Insomnia: no   Hypersomnia: no Fatigue/loss of energy: yes Feelings of worthlessness: no Feelings of guilt: no Impaired concentration/indecisiveness: no Suicidal ideations: no  Crying spells: no Recent Stressors/Life Changes: yes   Relationship problems: no   Family stress: no     Financial stress: no    Job stress: no    Recent death/loss:  no  Impaired Fasting Glucose HbA1C:  Lab Results  Component Value Date   HGBA1C 5.6 12/08/2021   Duration of elevated blood sugar: chronic Polydipsia: no Polyuria: no Weight change: no Visual disturbance: no Glucose Monitoring: no Diabetic Education: Not Completed Family history of diabetes: yes  Relevant past medical, surgical, family and social history reviewed and updated as indicated. Interim medical history since our last visit reviewed. Allergies and medications reviewed and updated.  Review of Systems  Constitutional: Negative.   HENT:  Positive for congestion, rhinorrhea, sinus pressure and sinus pain. Negative for dental problem, drooling, ear discharge, ear pain, facial swelling, hearing loss, mouth sores, nosebleeds, postnasal drip, sneezing, sore throat, tinnitus, trouble swallowing and voice  change.   Respiratory: Negative.    Cardiovascular: Negative.   Gastrointestinal: Negative.        Issues with swallowing  Genitourinary: Negative.   Musculoskeletal: Negative.   Neurological:  Positive for numbness (bilateral feet). Negative for dizziness, tremors, seizures, syncope, facial asymmetry, speech difficulty, weakness, light-headedness and headaches.  Psychiatric/Behavioral:  Negative for agitation, behavioral problems, confusion, decreased concentration, dysphoric mood, hallucinations, self-injury, sleep disturbance and suicidal ideas. The patient is nervous/anxious. The patient is not hyperactive.     Per HPI unless specifically indicated above     Objective:    BP 113/73   Pulse 80   Temp 99 F (37.2 C) (Oral)   Ht 5\' 1"  (1.549 m)   Wt 197 lb 3.2 oz (89.4 kg)   SpO2 99%   BMI 37.26 kg/m   Wt Readings from Last 3 Encounters:  08/23/22 197 lb 3.2 oz (89.4 kg)  08/10/22 197 lb 8 oz (89.6 kg)  05/28/22 205 lb 6.4 oz (93.2 kg)    Physical Exam Vitals and nursing note reviewed.  Constitutional:      General: She is not in acute distress.    Appearance: Normal appearance. She is obese. She is not ill-appearing, toxic-appearing or diaphoretic.  HENT:     Head: Normocephalic and atraumatic.     Right Ear: External ear normal.     Left Ear: External ear normal.     Nose: Nose normal.     Mouth/Throat:     Mouth: Mucous membranes are moist.     Pharynx: Oropharynx is clear.  Eyes:     General: No scleral icterus.       Right eye: No discharge.        Left eye: No discharge.     Extraocular Movements: Extraocular movements intact.     Conjunctiva/sclera: Conjunctivae normal.     Pupils: Pupils are equal, round, and reactive to light.  Cardiovascular:     Rate and Rhythm: Normal rate and regular rhythm.     Pulses: Normal pulses.     Heart sounds: Normal heart sounds. No murmur heard.    No friction rub. No gallop.  Pulmonary:     Effort: Pulmonary effort is  normal. No respiratory distress.     Breath sounds: Normal breath sounds. No stridor. No wheezing, rhonchi or rales.  Chest:     Chest wall: No tenderness.  Musculoskeletal:        General: Normal range of motion.     Cervical back: Normal range of motion and neck supple.     Comments: L arm swelling throughout upper arm  Skin:    General: Skin is warm and dry.     Capillary Refill: Capillary refill takes less than 2 seconds.  Coloration: Skin is not jaundiced or pale.     Findings: No bruising, erythema, lesion or rash.  Neurological:     General: No focal deficit present.     Mental Status: She is alert and oriented to person, place, and time. Mental status is at baseline.  Psychiatric:        Mood and Affect: Mood normal.        Behavior: Behavior normal.        Thought Content: Thought content normal.        Judgment: Judgment normal.     Results for orders placed or performed in visit on 08/10/22  Rapid Strep screen([facility])   Specimen: Other   Other  Result Value Ref Range   Strep Gp A Ag, IA W/Reflex Negative Negative  Culture, Group A Strep   Other  Result Value Ref Range   Strep A Culture Negative   Veritor Flu A/B Waived  Result Value Ref Range   Influenza A Negative Negative   Influenza B Negative Negative      Assessment & Plan:   Problem List Items Addressed This Visit       Cardiovascular and Mediastinum   Senile purpura (HCC)    Reassured patient. Continue to monitor.       Relevant Medications   benazepril (LOTENSIN) 20 MG tablet   Other Relevant Orders   CBC with Differential/Platelet   Comprehensive metabolic panel     Respiratory   Restrictive lung disease    Under good control on current regimen. Continue current regimen. Continue to monitor. Call with any concerns. Refills given. Labs drawn today.        Relevant Orders   CBC with Differential/Platelet   Comprehensive metabolic panel   Seasonal allergic rhinitis due to  pollen    Under good control on current regimen. Continue current regimen. Continue to monitor. Call with any concerns. Refills given. Labs drawn today.         Digestive   Fatty liver    Rechecking labs today. Await results. Treat as needed.       Relevant Orders   CBC with Differential/Platelet   Comprehensive metabolic panel     Endocrine   IFG (impaired fasting glucose)    Rechecking labs today. Await results. Treat as needed.       Relevant Orders   CBC with Differential/Platelet   Comprehensive metabolic panel     Nervous and Auditory   Polyneuropathy associated with underlying disease (HCC)    OK to take Nervive. Rechecking labs today. Await results. Treat as needed.       Relevant Medications   QUEtiapine (SEROQUEL) 25 MG tablet   Other Relevant Orders   CBC with Differential/Platelet   Comprehensive metabolic panel     Musculoskeletal and Integument   Osteoarthritis of both knees    Referral to ortho placed today. Await their input.      Relevant Medications   etodolac (LODINE) 400 MG tablet   Other Relevant Orders   Ambulatory referral to Orthopedic Surgery     Genitourinary   Benign hypertensive renal disease - Primary    Under good control on current regimen. Continue current regimen. Continue to monitor. Call with any concerns. Refills given. Labs drawn today.        Relevant Orders   CBC with Differential/Platelet   Comprehensive metabolic panel   Lipid Panel w/o Chol/HDL Ratio   CKD (chronic kidney disease) stage 1, GFR 90 ml/min or greater  Rechecking labs today. Await results. Treat as needed.       Relevant Orders   CBC with Differential/Platelet   Comprehensive metabolic panel     Other   Morbid obesity (HCC)    Encouraged diet and exercise with goal of losing 1-2 lbs per week.       Vitamin D deficiency disease    Rechecking labs today. Await results. Treat as needed.       Relevant Orders   CBC with Differential/Platelet    Comprehensive metabolic panel   VITAMIN D 25 Hydroxy (Vit-D Deficiency, Fractures)   Vitamin B12 deficiency    Rechecking labs today. Await results. Treat as needed.       Relevant Orders   CBC with Differential/Platelet   Comprehensive metabolic panel   Z61   Depression    Under good control on current regimen. Continue current regimen. Continue to monitor. Call with any concerns. Refills given.        Relevant Medications   QUEtiapine (SEROQUEL) 25 MG tablet   Dyslipidemia    Rechecking labs today. Await results. Treat as needed.       Relevant Orders   CBC with Differential/Platelet   Comprehensive metabolic panel   Other Visit Diagnoses     Pill dysphagia       ? due to congestion. Will treat with flonase- if not getting better will refer to GI.   Left arm swelling       Will get set up for [place]. Await results.   Relevant Orders   VAS [place] UPPER EXTREMITY VENOUS DUPLEX        Follow up plan: Return in about 6 months (around 02/22/2023) for physical.

## 2022-08-23 NOTE — Assessment & Plan Note (Signed)
Reassured patient. Continue to monitor.  

## 2022-08-23 NOTE — Patient Instructions (Addendum)
Your mammogram and bone density scans are scheduled on 09/16/22 at 8:20 AM at [facility] in [place]. They will do the scans back to back.  Address: [address], [place], [place] [address] Phone: [phone]

## 2022-08-23 NOTE — Assessment & Plan Note (Signed)
OK to take Nervive. Rechecking labs today. Await results. Treat as needed.

## 2022-08-24 LAB — COMPREHENSIVE METABOLIC PANEL
ALT: 13 IU/L (ref 0–32)
AST: 19 IU/L (ref 0–40)
Albumin/Globulin Ratio: 2 (ref 1.2–2.2)
Albumin: 4.5 g/dL (ref 3.9–4.9)
Alkaline Phosphatase: 75 IU/L (ref 44–121)
BUN/Creatinine Ratio: 31 — ABNORMAL HIGH (ref 12–28)
BUN: 21 mg/dL (ref 8–27)
Bilirubin Total: 0.4 mg/dL (ref 0.0–1.2)
CO2: 22 mmol/L (ref 20–29)
Calcium: 10.2 mg/dL (ref 8.7–10.3)
Chloride: 104 mmol/L (ref 96–106)
Creatinine, Ser: 0.68 mg/dL (ref 0.57–1.00)
Globulin, Total: 2.3 g/dL (ref 1.5–4.5)
Glucose: 82 mg/dL (ref 70–99)
Potassium: 5.2 mmol/L (ref 3.5–5.2)
Sodium: 141 mmol/L (ref 134–144)
Total Protein: 6.8 g/dL (ref 6.0–8.5)
eGFR: 97 mL/min/{1.73_m2} (ref >59)

## 2022-08-24 LAB — LIPID PANEL W/O CHOL/HDL RATIO
Cholesterol, Total: 226 mg/dL — ABNORMAL HIGH (ref 100–199)
HDL: 67 mg/dL (ref >39)
LDL Chol Calc (NIH): 135 mg/dL — ABNORMAL HIGH (ref 0–99)
Triglycerides: 134 mg/dL (ref 0–149)
VLDL Cholesterol Cal: 24 mg/dL (ref 5–40)

## 2022-08-24 LAB — VITAMIN B12: Vitamin B-12: 579 pg/mL (ref 232–1245)

## 2022-08-24 LAB — CBC WITH DIFFERENTIAL/PLATELET
Basophils Absolute: 0.1 10*3/uL (ref 0.0–0.2)
Basos: 1 %
EOS (ABSOLUTE): 0.2 10*3/uL (ref 0.0–0.4)
Eos: 2 %
Hematocrit: 38.9 % (ref 34.0–46.6)
Hemoglobin: 13.1 g/dL (ref 11.1–15.9)
Immature Grans (Abs): 0 10*3/uL (ref 0.0–0.1)
Immature Granulocytes: 0 %
Lymphocytes Absolute: 3.1 10*3/uL (ref 0.7–3.1)
Lymphs: 37 %
MCH: 32.8 pg (ref 26.6–33.0)
MCHC: 33.7 g/dL (ref 31.5–35.7)
MCV: 97 fL (ref 79–97)
Monocytes Absolute: 0.5 10*3/uL (ref 0.1–0.9)
Monocytes: 6 %
Neutrophils Absolute: 4.4 10*3/uL (ref 1.4–7.0)
Neutrophils: 54 %
Platelets: 329 10*3/uL (ref 150–450)
RBC: 4 x10E6/uL (ref 3.77–5.28)
RDW: 11.9 % (ref 11.7–15.4)
WBC: 8.3 10*3/uL (ref 3.4–10.8)

## 2022-08-24 LAB — VITAMIN D 25 HYDROXY (VIT D DEFICIENCY, FRACTURES): Vit D, 25-Hydroxy: 35.4 ng/mL (ref 30.0–100.0)

## 2022-08-26 ENCOUNTER — Other Ambulatory Visit: Payer: Medicare HMO

## 2022-08-27 ENCOUNTER — Ambulatory Visit: Payer: Medicare HMO | Admitting: Family Medicine

## 2022-08-31 ENCOUNTER — Ambulatory Visit (INDEPENDENT_AMBULATORY_CARE_PROVIDER_SITE_OTHER): Payer: Medicare HMO | Admitting: Family Medicine

## 2022-08-31 ENCOUNTER — Encounter: Payer: Self-pay | Admitting: Family Medicine

## 2022-08-31 VITALS — BP 130/73 | HR 79 | Temp 98.8°F | Ht 61.0 in | Wt 196.9 lb

## 2022-08-31 DIAGNOSIS — M25511 Pain in right shoulder: Secondary | ICD-10-CM

## 2022-08-31 MED ORDER — DICLOFENAC SODIUM 1 % EX GEL: 4.0000 g | Freq: Four times a day (QID) | CUTANEOUS | 4 refills | Status: DC

## 2022-08-31 NOTE — Progress Notes (Signed)
BP 130/73   Pulse 79   Temp 98.8 F (37.1 C)   Ht 5\' 1"  (1.549 m)   Wt 196 lb 14.4 oz (89.3 kg)   SpO2 96%   BMI 37.20 kg/m    Subjective:    Patient ID: Jaime Freeman, female    DOB: 1955/09/21, 67 y.o.   MRN: [id number]  HPI: Jaime Freeman is a 67 y.o. female  Chief Complaint  Patient presents with   Shoulder Injury    Patient last week after her appointment, she went home and fell on her R shoulder. Patient says she heard a slight pop in the arm and says she has an history of the shoulder dislocate.    SHOULDER PAIN Duration: 1 week Involved shoulder: right Mechanism of injury:  lifting Location: lateral Onset:sudden Severity: moderate  Quality:  aching Frequency: intermittent Radiation: no Aggravating factors: using it Alleviating factors:  muscle rub Status: better Treatments attempted: rest, ice, heat, and APAP  Relief with NSAIDs?:  No NSAIDs Taken Weakness: no Numbness: no Decreased grip strength: yes Redness: no Swelling: no Bruising: no Fevers: no   Relevant past medical, surgical, family and social history reviewed and updated as indicated. Interim medical history since our last visit reviewed. Allergies and medications reviewed and updated.  Review of Systems  Constitutional: Negative.   Respiratory: Negative.    Cardiovascular: Negative.   Gastrointestinal: Negative.   Musculoskeletal:  Positive for arthralgias and myalgias. Negative for back pain, gait problem, joint swelling, neck pain and neck stiffness.  Skin: Negative.   Neurological: Negative.   Psychiatric/Behavioral: Negative.      Per HPI unless specifically indicated above     Objective:    BP 130/73   Pulse 79   Temp 98.8 F (37.1 C)   Ht 5\' 1"  (1.549 m)   Wt 196 lb 14.4 oz (89.3 kg)   SpO2 96%   BMI 37.20 kg/m   Wt Readings from Last 3 Encounters:  08/31/22 196 lb 14.4 oz (89.3 kg)  08/23/22 197 lb 3.2 oz (89.4 kg)  08/10/22 197 lb 8 oz (89.6 kg)    Physical  Exam Vitals and nursing note reviewed.  Constitutional:      General: She is not in acute distress.    Appearance: Normal appearance. She is not ill-appearing, toxic-appearing or diaphoretic.  HENT:     Head: Normocephalic and atraumatic.     Right Ear: External ear normal.     Left Ear: External ear normal.     Nose: Nose normal.     Mouth/Throat:     Mouth: Mucous membranes are moist.     Pharynx: Oropharynx is clear.  Eyes:     General: No scleral icterus.       Right eye: No discharge.        Left eye: No discharge.     Extraocular Movements: Extraocular movements intact.     Conjunctiva/sclera: Conjunctivae normal.     Pupils: Pupils are equal, round, and reactive to light.  Cardiovascular:     Rate and Rhythm: Normal rate and regular rhythm.     Pulses: Normal pulses.     Heart sounds: Normal heart sounds. No murmur heard.    No friction rub. No gallop.  Pulmonary:     Effort: Pulmonary effort is normal. No respiratory distress.     Breath sounds: Normal breath sounds. No stridor. No wheezing, rhonchi or rales.  Chest:     Chest wall: No tenderness.  Musculoskeletal:        General: Tenderness (R deltoid) present. No swelling, deformity or signs of injury. Normal range of motion.     Cervical back: Normal range of motion and neck supple.     Right lower leg: No edema.     Left lower leg: No edema.  Skin:    General: Skin is warm and dry.     Capillary Refill: Capillary refill takes less than 2 seconds.     Coloration: Skin is not jaundiced or pale.     Findings: No bruising, erythema, lesion or rash.  Neurological:     General: No focal deficit present.     Mental Status: She is alert and oriented to person, place, and time. Mental status is at baseline.  Psychiatric:        Mood and Affect: Mood normal.        Behavior: Behavior normal.        Thought Content: Thought content normal.        Judgment: Judgment normal.     Results for orders placed or performed  in visit on 08/23/22  CBC with Differential/Platelet  Result Value Ref Range   WBC 8.3 3.4 - 10.8 x10E3/uL   RBC 4.00 3.77 - 5.28 x10E6/uL   Hemoglobin 13.1 11.1 - 15.9 g/dL   Hematocrit 82.9 56.2 - 46.6 %   MCV 97 79 - 97 fL   MCH 32.8 26.6 - 33.0 pg   MCHC 33.7 31.5 - 35.7 g/dL   RDW 13.0 86.5 - 78.4 %   Platelets 329 150 - 450 x10E3/uL   Neutrophils 54 Not Estab. %   Lymphs 37 Not Estab. %   Monocytes 6 Not Estab. %   Eos 2 Not Estab. %   Basos 1 Not Estab. %   Neutrophils Absolute 4.4 1.4 - 7.0 x10E3/uL   Lymphocytes Absolute 3.1 0.7 - 3.1 x10E3/uL   Monocytes Absolute 0.5 0.1 - 0.9 x10E3/uL   EOS (ABSOLUTE) 0.2 0.0 - 0.4 x10E3/uL   Basophils Absolute 0.1 0.0 - 0.2 x10E3/uL   Immature Granulocytes 0 Not Estab. %   Immature Grans (Abs) 0.0 0.0 - 0.1 x10E3/uL  Comprehensive metabolic panel  Result Value Ref Range   Glucose 82 70 - 99 mg/dL   BUN 21 8 - 27 mg/dL   Creatinine, Ser 6.96 0.57 - 1.00 mg/dL   eGFR 97 >29 BM/WUX/3.24   BUN/Creatinine Ratio 31 (H) 12 - 28   Sodium 141 134 - 144 mmol/L   Potassium 5.2 3.5 - 5.2 mmol/L   Chloride 104 96 - 106 mmol/L   CO2 22 20 - 29 mmol/L   Calcium 10.2 8.7 - 10.3 mg/dL   Total Protein 6.8 6.0 - 8.5 g/dL   Albumin 4.5 3.9 - 4.9 g/dL   Globulin, Total 2.3 1.5 - 4.5 g/dL   Albumin/Globulin Ratio 2.0 1.2 - 2.2   Bilirubin Total 0.4 0.0 - 1.2 mg/dL   Alkaline Phosphatase 75 44 - 121 IU/L   AST 19 0 - 40 IU/L   ALT 13 0 - 32 IU/L  Lipid Panel w/o Chol/HDL Ratio  Result Value Ref Range   Cholesterol, Total 226 (H) 100 - 199 mg/dL   Triglycerides 401 0 - 149 mg/dL   HDL 67 >02 mg/dL   VLDL Cholesterol Cal 24 5 - 40 mg/dL   LDL Chol Calc (NIH) 725 (H) 0 - 99 mg/dL  VITAMIN D 25 Hydroxy (Vit-D Deficiency, Fractures)  Result Value Ref Range  Vit D, 25-Hydroxy 35.4 30.0 - 100.0 ng/mL  B12  Result Value Ref Range   Vitamin B-12 579 232 - 1,245 pg/mL      Assessment & Plan:   Problem List Items Addressed This Visit    None Visit Diagnoses     Acute pain of right shoulder    -  Primary   Will start stretches and voltaren. If not better next week will go for x-rays. Call with any concerns. Continue to monitor.   Relevant Orders   DG Shoulder Right        Follow up plan: Return if symptoms worsen or fail to improve.

## 2022-09-14 ENCOUNTER — Telehealth: Payer: Self-pay | Admitting: Family Medicine

## 2022-09-14 NOTE — Telephone Encounter (Signed)
This is through cardiology. She will need to contact them

## 2022-09-14 NOTE — Telephone Encounter (Unsigned)
Copied from CRM [id number]. Topic: Appointment Scheduling - Scheduling Inquiry for Clinic >> Sep 14, 2022  2:11 PM Carrielelia G wrote: Reason for CRM:  pt has an appt May 31, for venous doppler:  patient states it should be the right arm and she should also have an US of the neck right side Please advise

## 2022-09-14 NOTE — Telephone Encounter (Signed)
At her last appointment I ordered an x-ray on her shoulder, which is not done at Cardiology. I'm not sure what she's getting at cardiology. I would advise her to use the muscle rub.

## 2022-09-15 NOTE — Telephone Encounter (Signed)
Patient says she forgot that during the appointment back on 08/23/22, she mentioned the L arm swelling and she says she did not remember Dr Laural Benes mentioning that she would have any imaging done to the area, as there was a lot being discussed during that visit back in April. Patient is now asking if she needs to have additional imaging done since she never had it done due to the R arm pain from lifting the dog gate and hearing a popping sound. Please advise?

## 2022-09-16 ENCOUNTER — Other Ambulatory Visit: Payer: Medicare HMO

## 2022-09-16 NOTE — Telephone Encounter (Signed)
If her left arm is not swelling anymore, she does not need to have the US done. She still needs to go get the x-ray of her R shoulder done and she can jut stop by [place] to do that.

## 2022-09-16 NOTE — Telephone Encounter (Signed)
Patient was notified and made aware of Dr Henriette Combs recommendations. Patient verbalized and has no further questions at this time.

## 2022-09-23 ENCOUNTER — Telehealth: Payer: Self-pay | Admitting: Family Medicine

## 2022-09-23 NOTE — Telephone Encounter (Signed)
Vikki Ports with [facility] is calling to get authorization for the pt.  Please call Vikki Ports back: [phone]

## 2022-09-28 ENCOUNTER — Ambulatory Visit: Payer: Self-pay

## 2022-09-28 NOTE — Telephone Encounter (Signed)
  Chief Complaint: neck pain  Symptoms: R sided neck and shoulder pain, burning sensation, comes and goes Frequency: since 08/31/22 Pertinent Negatives: NA Disposition: [] ED /[] Urgent Care (no appt availability in office) / [x] Appointment(In office/virtual)/ []  [facility]/ [] Home Care/ [] Refused Recommended Disposition /[] [facility]/ []  Follow-up with PCP Additional Notes: pt had shoulder injury to R side d/t helping dog and got pulled and pt had OV with PCP on 08/31/22. Had felt like R arm got better but now feels like the burning sensation is worse at times. Had quit taking the Diclofenac because R arm improved so advised to start back on that and can take Tylenol with it in if needed but not Ibuprofen. Pt scheduled OV on 09/30/22 with Clydie Braun, NP at 1600 so recommended to call back if sx get worse before then. Pt verbalized understanding.   Reason for Disposition  Neck pain present > 2 weeks  Answer Assessment - Initial Assessment Questions 1. ONSET: "When did the pain begin?"      Ongoing since 08/31/22 had OV then 2. LOCATION: "Where does it hurt?"      R neck and R shoulder  3. PATTERN "Does the pain come and go, or has it been constant since it started?"      Comes and goes  4. SEVERITY: "How bad is the pain?"  (Scale 1-10; or mild, moderate, severe)   - NO PAIN (0): no pain or only slight stiffness    - MILD (1-3): doesn't interfere with normal activities    - MODERATE (4-7): interferes with normal activities or awakens from sleep    - SEVERE (8-10):  excruciating pain, unable to do any normal activities      Burning sensation  Protocols used: Neck Pain or Stiffness-A-AH

## 2022-09-30 ENCOUNTER — Encounter: Payer: Self-pay | Admitting: Nurse Practitioner

## 2022-09-30 ENCOUNTER — Ambulatory Visit (INDEPENDENT_AMBULATORY_CARE_PROVIDER_SITE_OTHER): Payer: Medicare HMO | Admitting: Nurse Practitioner

## 2022-09-30 VITALS — BP 135/77 | HR 76 | Temp 98.5°F | Wt 197.2 lb

## 2022-09-30 DIAGNOSIS — F419 Anxiety disorder, unspecified: Secondary | ICD-10-CM | POA: Diagnosis not present

## 2022-09-30 DIAGNOSIS — M25511 Pain in right shoulder: Secondary | ICD-10-CM

## 2022-09-30 NOTE — Assessment & Plan Note (Signed)
Exacerbated at this time due to losing her dog.  Feels like she is having panic attacks which she hasn't had is many years.  Has had to use Klonopin occasionally to avoid panic attacks.  She is cutting them in half and then in half again.  Is a afraid her anxiety is coming back.  Recommend she use the Klonopin PRN for anxiety.  Follow up in two weeks with PCP in case anxiety is still exacerbated and medications can be adjusted.

## 2022-09-30 NOTE — Progress Notes (Signed)
BP 135/77   Pulse 76   Temp 98.5 F (36.9 C) (Oral)   Wt 197 lb 3.2 oz (89.4 kg)   SpO2 98%   BMI 37.26 kg/m    Subjective:    Patient ID: Jaime Freeman, female    DOB: [birth year], 67 y.o.   MRN: [id number]  HPI: Jaime Freeman is a 67 y.o. female  Chief Complaint  Patient presents with   Neck Pain   ARM/SHOULDER PAIN  Patient states she hurt her arm about a month ago.  She jerked a gate up and the gate didn't go up.  She states that the pain comes and goes.  States the pain has been more persistent for the last week.  She states the voltaren gel is helping with her pain some.  The neck pain comes and goes.     Relevant past medical, surgical, family and social history reviewed and updated as indicated. Interim medical history since our last visit reviewed. Allergies and medications reviewed and updated.  Review of Systems  Musculoskeletal:        Right shoulder pain    Per HPI unless specifically indicated above     Objective:    BP 135/77   Pulse 76   Temp 98.5 F (36.9 C) (Oral)   Wt 197 lb 3.2 oz (89.4 kg)   SpO2 98%   BMI 37.26 kg/m   Wt Readings from Last 3 Encounters:  09/30/22 197 lb 3.2 oz (89.4 kg)  08/31/22 196 lb 14.4 oz (89.3 kg)  08/23/22 197 lb 3.2 oz (89.4 kg)    Physical Exam Vitals and nursing note reviewed.  Constitutional:      General: She is not in acute distress.    Appearance: Normal appearance. She is normal weight. She is not ill-appearing, toxic-appearing or diaphoretic.  HENT:     Head: Normocephalic.     Right Ear: External ear normal.     Left Ear: External ear normal.     Nose: Nose normal.     Mouth/Throat:     Mouth: Mucous membranes are moist.     Pharynx: Oropharynx is clear.  Eyes:     General:        Right eye: No discharge.        Left eye: No discharge.     Extraocular Movements: Extraocular movements intact.     Conjunctiva/sclera: Conjunctivae normal.     Pupils: Pupils are equal, round, and reactive to  light.  Cardiovascular:     Rate and Rhythm: Normal rate and regular rhythm.     Heart sounds: No murmur heard. Pulmonary:     Effort: Pulmonary effort is normal. No respiratory distress.     Breath sounds: Normal breath sounds. No wheezing or rales.  Musculoskeletal:     Cervical back: Normal range of motion and neck supple.  Skin:    General: Skin is warm and dry.     Capillary Refill: Capillary refill takes less than 2 seconds.  Neurological:     General: No focal deficit present.     Mental Status: She is alert and oriented to person, place, and time. Mental status is at baseline.  Psychiatric:        Mood and Affect: Mood normal.        Behavior: Behavior normal.        Thought Content: Thought content normal.        Judgment: Judgment normal.     Results for  orders placed or performed in visit on 08/23/22  CBC with Differential/Platelet  Result Value Ref Range   WBC 8.3 3.4 - 10.8 x10E3/uL   RBC 4.00 3.77 - 5.28 x10E6/uL   Hemoglobin 13.1 11.1 - 15.9 g/dL   Hematocrit 16.1 09.6 - 46.6 %   MCV 97 79 - 97 fL   MCH 32.8 26.6 - 33.0 pg   MCHC 33.7 31.5 - 35.7 g/dL   RDW 04.5 40.9 - 81.1 %   Platelets 329 150 - 450 x10E3/uL   Neutrophils 54 Not Estab. %   Lymphs 37 Not Estab. %   Monocytes 6 Not Estab. %   Eos 2 Not Estab. %   Basos 1 Not Estab. %   Neutrophils Absolute 4.4 1.4 - 7.0 x10E3/uL   Lymphocytes Absolute 3.1 0.7 - 3.1 x10E3/uL   Monocytes Absolute 0.5 0.1 - 0.9 x10E3/uL   EOS (ABSOLUTE) 0.2 0.0 - 0.4 x10E3/uL   Basophils Absolute 0.1 0.0 - 0.2 x10E3/uL   Immature Granulocytes 0 Not Estab. %   Immature Grans (Abs) 0.0 0.0 - 0.1 x10E3/uL  Comprehensive metabolic panel  Result Value Ref Range   Glucose 82 70 - 99 mg/dL   BUN 21 8 - 27 mg/dL   Creatinine, Ser 9.14 0.57 - 1.00 mg/dL   eGFR 97 >78 GN/FAO/1.30   BUN/Creatinine Ratio 31 (H) 12 - 28   Sodium 141 134 - 144 mmol/L   Potassium 5.2 3.5 - 5.2 mmol/L   Chloride 104 96 - 106 mmol/L   CO2 22 20 -  29 mmol/L   Calcium 10.2 8.7 - 10.3 mg/dL   Total Protein 6.8 6.0 - 8.5 g/dL   Albumin 4.5 3.9 - 4.9 g/dL   Globulin, Total 2.3 1.5 - 4.5 g/dL   Albumin/Globulin Ratio 2.0 1.2 - 2.2   Bilirubin Total 0.4 0.0 - 1.2 mg/dL   Alkaline Phosphatase 75 44 - 121 IU/L   AST 19 0 - 40 IU/L   ALT 13 0 - 32 IU/L  Lipid Panel w/o Chol/HDL Ratio  Result Value Ref Range   Cholesterol, Total 226 (H) 100 - 199 mg/dL   Triglycerides 865 0 - 149 mg/dL   HDL 67 >78 mg/dL   VLDL Cholesterol Cal 24 5 - 40 mg/dL   LDL Chol Calc (NIH) 469 (H) 0 - 99 mg/dL  VITAMIN D 25 Hydroxy (Vit-D Deficiency, Fractures)  Result Value Ref Range   Vit D, 25-Hydroxy 35.4 30.0 - 100.0 ng/mL  B12  Result Value Ref Range   Vitamin B-12 579 232 - 1,245 pg/mL      Assessment & Plan:   Problem List Items Addressed This Visit       Other   Anxiety    Exacerbated at this time due to losing her dog.  Feels like she is having panic attacks which she hasn't had is many years.  Has had to use Klonopin occasionally to avoid panic attacks.  She is cutting them in half and then in half again.  Is a afraid her anxiety is coming back.  Recommend she use the Klonopin PRN for anxiety.  Follow up in two weeks with PCP in case anxiety is still exacerbated and medications can be adjusted.       Other Visit Diagnoses     Acute pain of right shoulder    -  Primary   Will obtain xray of shoulder. Continue with voltaren gel PRN for pain.  Will make recommendations based on imaging results.  Relevant Orders   DG Shoulder Right        Follow up plan: Return in about 2 weeks (around 10/14/2022) for Anxiety with Dr. Shela Commons.

## 2022-10-06 ENCOUNTER — Ambulatory Visit
Admission: RE | Admit: 2022-10-06 | Discharge: 2022-10-06 | Disposition: A | Discharge status: Home / Self Care | Payer: Medicare HMO | Source: Ambulatory Visit | Attending: Nurse Practitioner | Admitting: Nurse Practitioner

## 2022-10-06 ENCOUNTER — Ambulatory Visit
Admission: RE | Admit: 2022-10-06 | Discharge: 2022-10-06 | Disposition: A | Discharge status: Home / Self Care | Payer: Medicare HMO | Attending: Nurse Practitioner | Admitting: Nurse Practitioner

## 2022-10-06 DIAGNOSIS — M25511 Pain in right shoulder: Secondary | ICD-10-CM | POA: Insufficient documentation

## 2022-10-11 NOTE — Addendum Note (Signed)
Addended by: Larae Grooms on: 10/11/2022 08:46 AM   Modules accepted: Orders

## 2022-10-11 NOTE — Progress Notes (Signed)
Please let patient know that there is a possible benign (meaning not cancerous) tumor in her right shoulder or a piece of bone- on xray they look very similar.  I recommend she see orthopedics for further evaluation.  I have placed the referral.

## 2022-10-14 ENCOUNTER — Ambulatory Visit (INDEPENDENT_AMBULATORY_CARE_PROVIDER_SITE_OTHER): Payer: Medicare HMO | Admitting: Family Medicine

## 2022-10-14 ENCOUNTER — Encounter: Payer: Self-pay | Admitting: Family Medicine

## 2022-10-14 VITALS — BP 137/80 | HR 80 | Temp 98.6°F | Ht 61.0 in | Wt 197.4 lb

## 2022-10-14 DIAGNOSIS — F4321 Adjustment disorder with depressed mood: Secondary | ICD-10-CM

## 2022-10-14 MED ORDER — CLONAZEPAM 0.5 MG PO TABS: 0.5000 mg | ORAL_TABLET | Freq: Two times a day (BID) | ORAL | 0 refills | Status: DC | PRN

## 2022-10-14 NOTE — Progress Notes (Signed)
BP 137/80   Pulse 80   Temp 98.6 F (37 C) (Oral)   Ht 5\' 1"  (1.549 m)   Wt 197 lb 6.4 oz (89.5 kg)   SpO2 98%   BMI 37.30 kg/m    Subjective:    Patient ID: Jaime Freeman, female    DOB: 06-26-1955, 67 y.o.   MRN: [id number]  HPI: Jaime Freeman is a 67 y.o. female  Chief Complaint  Patient presents with   Anxiety   Depression    Patient says recently her dog passed away about a few weeks ago, but this last week it is gotten a little heavier on her and she cries when she talks about him. Patient says she has had her dog for over 10+ years.    ANXIETY/DEPRESSION Duration: chronic Status:worse Anxious mood: yes  Excessive worrying: yes Irritability: no  Sweating: no Nausea: no Palpitations:no Hyperventilation: no Panic attacks: no Agoraphobia: no  Obscessions/compulsions: no Depressed mood: yes    10/14/2022    2:01 PM 09/30/2022    3:45 PM 08/23/2022    2:21 PM 03/11/2022    2:53 PM 02/19/2022    9:14 AM  Depression screen PHQ 2/9  Decreased Interest 2 1 0 0 0  Down, Depressed, Hopeless 2 1 1  0 0  PHQ - 2 Score 4 2 1  0 0  Altered sleeping 1 0 0 0 0  Tired, decreased energy 2 0 0 0 0  Change in appetite 2 0 0 0 0  Feeling bad or failure about yourself  0 0 0 0 0  Trouble concentrating 2 0 1 0 0  Moving slowly or fidgety/restless 0 0 0 0 0  Suicidal thoughts 0 0 0 0 0  PHQ-9 Score 11 2 2  0 0  Difficult doing work/chores Somewhat difficult Not difficult at all Somewhat difficult Not difficult at all Not difficult at all      10/14/2022    2:01 PM 09/30/2022    3:45 PM 08/23/2022    2:21 PM 03/11/2022    2:53 PM  GAD 7 : Generalized Anxiety Score  Nervous, Anxious, on Edge 0 1 1 1   Control/stop worrying 1 0 1 0  Worry too much - different things 2  1 0  Trouble relaxing 1 1 1  0  Restless 1 0 0 0  Easily annoyed or irritable 0 1 1 0  Afraid - awful might happen 0 0 1 0  Total GAD 7 Score 5  6 1   Anxiety Difficulty  Not difficult at all Somewhat difficult  Not difficult at all   Anhedonia: no Weight changes: no Insomnia: yes   Hypersomnia: no Fatigue/loss of energy: yes Feelings of worthlessness: no Feelings of guilt: no Impaired concentration/indecisiveness: no Suicidal ideations: no  Crying spells: yes Recent Stressors/Life Changes: no   Relationship problems: no   Family stress: no     Financial stress: no    Job stress: no    Recent death/loss: yes  Relevant past medical, surgical, family and social history reviewed and updated as indicated. Interim medical history since our last visit reviewed. Allergies and medications reviewed and updated.  Review of Systems  Constitutional: Negative.   Respiratory: Negative.    Cardiovascular: Negative.   Gastrointestinal: Negative.   Musculoskeletal: Negative.   Psychiatric/Behavioral:  Positive for dysphoric mood. Negative for agitation, behavioral problems, confusion, decreased concentration, hallucinations, self-injury, sleep disturbance and suicidal ideas. The patient is nervous/anxious. The patient is not hyperactive.  Per HPI unless specifically indicated above     Objective:    BP 137/80   Pulse 80   Temp 98.6 F (37 C) (Oral)   Ht 5\' 1"  (1.549 m)   Wt 197 lb 6.4 oz (89.5 kg)   SpO2 98%   BMI 37.30 kg/m   Wt Readings from Last 3 Encounters:  10/14/22 197 lb 6.4 oz (89.5 kg)  09/30/22 197 lb 3.2 oz (89.4 kg)  08/31/22 196 lb 14.4 oz (89.3 kg)    Physical Exam Vitals and nursing note reviewed.  Constitutional:      General: She is not in acute distress.    Appearance: Normal appearance. She is not ill-appearing, toxic-appearing or diaphoretic.  HENT:     Head: Normocephalic and atraumatic.     Right Ear: External ear normal.     Left Ear: External ear normal.     Nose: Nose normal.     Mouth/Throat:     Mouth: Mucous membranes are moist.     Pharynx: Oropharynx is clear.  Eyes:     General: No scleral icterus.       Right eye: No discharge.        Left  eye: No discharge.     Extraocular Movements: Extraocular movements intact.     Conjunctiva/sclera: Conjunctivae normal.     Pupils: Pupils are equal, round, and reactive to light.  Cardiovascular:     Rate and Rhythm: Normal rate and regular rhythm.     Pulses: Normal pulses.     Heart sounds: Normal heart sounds. No murmur heard.    No friction rub. No gallop.  Pulmonary:     Effort: Pulmonary effort is normal. No respiratory distress.     Breath sounds: Normal breath sounds. No stridor. No wheezing, rhonchi or rales.  Chest:     Chest wall: No tenderness.  Musculoskeletal:        General: Normal range of motion.     Cervical back: Normal range of motion and neck supple.  Skin:    General: Skin is warm and dry.     Capillary Refill: Capillary refill takes less than 2 seconds.     Coloration: Skin is not jaundiced or pale.     Findings: No bruising, erythema, lesion or rash.  Neurological:     General: No focal deficit present.     Mental Status: She is alert and oriented to person, place, and time. Mental status is at baseline.  Psychiatric:        Mood and Affect: Mood normal.        Behavior: Behavior normal.        Thought Content: Thought content normal.        Judgment: Judgment normal.     Results for orders placed or performed in visit on 08/23/22  CBC with Differential/Platelet  Result Value Ref Range   WBC 8.3 3.4 - 10.8 x10E3/uL   RBC 4.00 3.77 - 5.28 x10E6/uL   Hemoglobin 13.1 11.1 - 15.9 g/dL   Hematocrit 16.1 09.6 - 46.6 %   MCV 97 79 - 97 fL   MCH 32.8 26.6 - 33.0 pg   MCHC 33.7 31.5 - 35.7 g/dL   RDW 04.5 40.9 - 81.1 %   Platelets 329 150 - 450 x10E3/uL   Neutrophils 54 Not Estab. %   Lymphs 37 Not Estab. %   Monocytes 6 Not Estab. %   Eos 2 Not Estab. %   Basos 1 Not  Estab. %   Neutrophils Absolute 4.4 1.4 - 7.0 x10E3/uL   Lymphocytes Absolute 3.1 0.7 - 3.1 x10E3/uL   Monocytes Absolute 0.5 0.1 - 0.9 x10E3/uL   EOS (ABSOLUTE) 0.2 0.0 - 0.4  x10E3/uL   Basophils Absolute 0.1 0.0 - 0.2 x10E3/uL   Immature Granulocytes 0 Not Estab. %   Immature Grans (Abs) 0.0 0.0 - 0.1 x10E3/uL  Comprehensive metabolic panel  Result Value Ref Range   Glucose 82 70 - 99 mg/dL   BUN 21 8 - 27 mg/dL   Creatinine, Ser 1.61 0.57 - 1.00 mg/dL   eGFR 97 >09 UE/AVW/0.98   BUN/Creatinine Ratio 31 (H) 12 - 28   Sodium 141 134 - 144 mmol/L   Potassium 5.2 3.5 - 5.2 mmol/L   Chloride 104 96 - 106 mmol/L   CO2 22 20 - 29 mmol/L   Calcium 10.2 8.7 - 10.3 mg/dL   Total Protein 6.8 6.0 - 8.5 g/dL   Albumin 4.5 3.9 - 4.9 g/dL   Globulin, Total 2.3 1.5 - 4.5 g/dL   Albumin/Globulin Ratio 2.0 1.2 - 2.2   Bilirubin Total 0.4 0.0 - 1.2 mg/dL   Alkaline Phosphatase 75 44 - 121 IU/L   AST 19 0 - 40 IU/L   ALT 13 0 - 32 IU/L  Lipid Panel w/o Chol/HDL Ratio  Result Value Ref Range   Cholesterol, Total 226 (H) 100 - 199 mg/dL   Triglycerides 119 0 - 149 mg/dL   HDL 67 >14 mg/dL   VLDL Cholesterol Cal 24 5 - 40 mg/dL   LDL Chol Calc (NIH) 782 (H) 0 - 99 mg/dL  VITAMIN D 25 Hydroxy (Vit-D Deficiency, Fractures)  Result Value Ref Range   Vit D, 25-Hydroxy 35.4 30.0 - 100.0 ng/mL  B12  Result Value Ref Range   Vitamin B-12 579 232 - 1,245 pg/mL      Assessment & Plan:   Problem List Items Addressed This Visit   None Visit Diagnoses     Grief    -  Primary   Recently lost her dog and she has been grieving. Will give small amount of klonopin. Continue to monitor. Recheck 6 weeks.        Follow up plan: Return in about 6 weeks (around 11/25/2022).

## 2022-10-15 DIAGNOSIS — M7541 Impingement syndrome of right shoulder: Secondary | ICD-10-CM | POA: Diagnosis not present

## 2022-10-15 DIAGNOSIS — M75101 Unspecified rotator cuff tear or rupture of right shoulder, not specified as traumatic: Secondary | ICD-10-CM | POA: Diagnosis not present

## 2022-10-15 DIAGNOSIS — M170 Bilateral primary osteoarthritis of knee: Secondary | ICD-10-CM | POA: Diagnosis not present

## 2022-11-05 ENCOUNTER — Ambulatory Visit: Payer: Self-pay | Admitting: *Deleted

## 2022-11-05 ENCOUNTER — Other Ambulatory Visit: Payer: Medicare HMO

## 2022-11-05 ENCOUNTER — Ambulatory Visit (INDEPENDENT_AMBULATORY_CARE_PROVIDER_SITE_OTHER): Payer: Medicare HMO | Admitting: Family Medicine

## 2022-11-05 ENCOUNTER — Encounter: Payer: Self-pay | Admitting: Family Medicine

## 2022-11-05 VITALS — BP 130/76 | HR 85 | Temp 97.9°F | Wt 198.0 lb

## 2022-11-05 DIAGNOSIS — J3089 Other allergic rhinitis: Secondary | ICD-10-CM | POA: Diagnosis not present

## 2022-11-05 DIAGNOSIS — J309 Allergic rhinitis, unspecified: Secondary | ICD-10-CM | POA: Insufficient documentation

## 2022-11-05 DIAGNOSIS — J029 Acute pharyngitis, unspecified: Secondary | ICD-10-CM

## 2022-11-05 MED ORDER — RYALTRIS 665-25 MCG/ACT NA SUSP
2.0000 | Freq: Two times a day (BID) | NASAL | 0 refills | Status: AC
Start: 2022-11-05 — End: 2022-11-13

## 2022-11-05 NOTE — Assessment & Plan Note (Signed)
Acute, stable. Ryaltris nasal spray ordered BID, recommend antihistamine use during the morning instead of bedtime. Recommend humidifier at night if symptoms worsen at night. If no improvement in 7 days f/u will consider treatment for sinus infection.

## 2022-11-05 NOTE — Progress Notes (Signed)
BP 130/76 (BP Location: Left Arm)   Pulse 85   Temp 97.9 F (36.6 C) (Oral)   Wt 198 lb (89.8 kg)   SpO2 99%   BMI 37.41 kg/m    Subjective:    Patient ID: Jaime Freeman, female    DOB: [birth year], 67 y.o.   MRN: [id number]  HPI: Jaime Freeman is a 68 y.o. female  Chief Complaint  Patient presents with   Sore Throat    Pt states it started yesterday. Not really sore today more so congested today.   Nasal Congestion   UPPER RESPIRATORY TRACT INFECTION Symptoms started yesterday morning when she woke up with voice hoarseness, postnasal drip and sinus congestion. She has been outside working in her ferns daily. She is taking an antihistamine at night and using an OTC nasal spray that is helping. Worst symptom: congestion in throat Fever: no Cough: yes with drainage non productive Shortness of breath: no Wheezing: no Chest pain: no Chest tightness: no Chest congestion: no Nasal congestion: yes Runny nose: no Post nasal drip: yes Sneezing: no Sore throat: yes Swollen glands: no Sinus pressure: no Headache: no Face pain: no Toothache: no Ear pain: no  Ear pressure: no  Eyes red/itching:no Eye drainage/crusting: no  Vomiting: no Rash: no Fatigue: no Sick contacts: no Strep contacts: no  Context: stable Recurrent sinusitis: yes Relief with OTC cold/cough medications: no  Treatments attempted:  OTC nasal spray today and anti-histamine, nasal spray opened her up  Relevant past medical, surgical, family and social history reviewed and updated as indicated. Interim medical history since our last visit reviewed. Allergies and medications reviewed and updated.  Review of Systems  Constitutional:  Negative for chills, fatigue and fever.  HENT:  Positive for congestion, postnasal drip, rhinorrhea and sore throat. Negative for ear pain, sinus pressure, sinus pain and sneezing.   Eyes:  Negative for discharge, redness and itching.  Respiratory:  Positive for cough.  Negative for chest tightness, shortness of breath and wheezing.   Cardiovascular:  Negative for chest pain.  Gastrointestinal:  Negative for vomiting.  Skin:  Negative for rash.  Neurological:  Negative for headaches.    Per HPI unless specifically indicated above     Objective:    BP 130/76 (BP Location: Left Arm)   Pulse 85   Temp 97.9 F (36.6 C) (Oral)   Wt 198 lb (89.8 kg)   SpO2 99%   BMI 37.41 kg/m   Wt Readings from Last 3 Encounters:  11/05/22 198 lb (89.8 kg)  10/14/22 197 lb 6.4 oz (89.5 kg)  09/30/22 197 lb 3.2 oz (89.4 kg)    Physical Exam Vitals and nursing note reviewed.  Constitutional:      General: She is not in acute distress.    Appearance: Normal appearance. She is not ill-appearing, toxic-appearing or diaphoretic.  HENT:     Head: Normocephalic and atraumatic.     Right Ear: Tympanic membrane, ear canal and external ear normal. There is no impacted cerumen.     Left Ear: Tympanic membrane, ear canal and external ear normal. Tenderness present. There is no impacted cerumen.     Nose: Congestion and rhinorrhea present.     Right Turbinates: Pale.     Left Turbinates: Pale.     Right Sinus: No maxillary sinus tenderness or frontal sinus tenderness.     Left Sinus: No maxillary sinus tenderness or frontal sinus tenderness.     Mouth/Throat:     Mouth:  Mucous membranes are moist.     Pharynx: Oropharynx is clear. No oropharyngeal exudate or posterior oropharyngeal erythema.  Eyes:     General: No scleral icterus.       Right eye: No discharge.        Left eye: No discharge.     Extraocular Movements: Extraocular movements intact.     Conjunctiva/sclera: Conjunctivae normal.     Pupils: Pupils are equal, round, and reactive to light.  Neck:     Vascular: No carotid bruit.  Cardiovascular:     Rate and Rhythm: Normal rate and regular rhythm.     Pulses: Normal pulses.     Heart sounds: Normal heart sounds. No murmur heard.    No friction rub. No  gallop.  Pulmonary:     Effort: Pulmonary effort is normal. No respiratory distress.     Breath sounds: Normal breath sounds. No stridor. No wheezing, rhonchi or rales.  Chest:     Chest wall: No tenderness.  Musculoskeletal:        General: Normal range of motion.     Cervical back: Normal range of motion and neck supple. No rigidity. No muscular tenderness.  Lymphadenopathy:     Cervical: Cervical adenopathy present.  Skin:    General: Skin is warm and dry.     Capillary Refill: Capillary refill takes less than 2 seconds.     Coloration: Skin is not jaundiced or pale.     Findings: No bruising, erythema, lesion or rash.  Neurological:     General: No focal deficit present.     Mental Status: She is alert and oriented to person, place, and time. Mental status is at baseline.     Cranial Nerves: No cranial nerve deficit.     Sensory: No sensory deficit.     Motor: No weakness.     Coordination: Coordination normal.     Gait: Gait normal.     Deep Tendon Reflexes: Reflexes normal.  Psychiatric:        Mood and Affect: Mood normal.        Behavior: Behavior normal.        Thought Content: Thought content normal.        Judgment: Judgment normal.     Results for orders placed or performed in visit on 08/23/22  CBC with Differential/Platelet  Result Value Ref Range   WBC 8.3 3.4 - 10.8 x10E3/uL   RBC 4.00 3.77 - 5.28 x10E6/uL   Hemoglobin 13.1 11.1 - 15.9 g/dL   Hematocrit 16.1 09.6 - 46.6 %   MCV 97 79 - 97 fL   MCH 32.8 26.6 - 33.0 pg   MCHC 33.7 31.5 - 35.7 g/dL   RDW 04.5 40.9 - 81.1 %   Platelets 329 150 - 450 x10E3/uL   Neutrophils 54 Not Estab. %   Lymphs 37 Not Estab. %   Monocytes 6 Not Estab. %   Eos 2 Not Estab. %   Basos 1 Not Estab. %   Neutrophils Absolute 4.4 1.4 - 7.0 x10E3/uL   Lymphocytes Absolute 3.1 0.7 - 3.1 x10E3/uL   Monocytes Absolute 0.5 0.1 - 0.9 x10E3/uL   EOS (ABSOLUTE) 0.2 0.0 - 0.4 x10E3/uL   Basophils Absolute 0.1 0.0 - 0.2 x10E3/uL    Immature Granulocytes 0 Not Estab. %   Immature Grans (Abs) 0.0 0.0 - 0.1 x10E3/uL  Comprehensive metabolic panel  Result Value Ref Range   Glucose 82 70 - 99 mg/dL   BUN 21  8 - 27 mg/dL   Creatinine, Ser 0.10 0.57 - 1.00 mg/dL   eGFR 97 >27 OZ/DGU/4.40   BUN/Creatinine Ratio 31 (H) 12 - 28   Sodium 141 134 - 144 mmol/L   Potassium 5.2 3.5 - 5.2 mmol/L   Chloride 104 96 - 106 mmol/L   CO2 22 20 - 29 mmol/L   Calcium 10.2 8.7 - 10.3 mg/dL   Total Protein 6.8 6.0 - 8.5 g/dL   Albumin 4.5 3.9 - 4.9 g/dL   Globulin, Total 2.3 1.5 - 4.5 g/dL   Albumin/Globulin Ratio 2.0 1.2 - 2.2   Bilirubin Total 0.4 0.0 - 1.2 mg/dL   Alkaline Phosphatase 75 44 - 121 IU/L   AST 19 0 - 40 IU/L   ALT 13 0 - 32 IU/L  Lipid Panel w/o Chol/HDL Ratio  Result Value Ref Range   Cholesterol, Total 226 (H) 100 - 199 mg/dL   Triglycerides 347 0 - 149 mg/dL   HDL 67 >42 mg/dL   VLDL Cholesterol Cal 24 5 - 40 mg/dL   LDL Chol Calc (NIH) 595 (H) 0 - 99 mg/dL  VITAMIN D 25 Hydroxy (Vit-D Deficiency, Fractures)  Result Value Ref Range   Vit D, 25-Hydroxy 35.4 30.0 - 100.0 ng/mL  B12  Result Value Ref Range   Vitamin B-12 579 232 - 1,245 pg/mL      Assessment & Plan:   Problem List Items Addressed This Visit     Allergic rhinitis due to allergen    Acute, stable. Ryaltris nasal spray ordered BID, recommend antihistamine use during the morning instead of bedtime. Recommend humidifier at night if symptoms worsen at night. If no improvement in 7 days f/u will consider treatment for sinus infection.       Relevant Medications   Olopatadine-Mometasone (RYALTRIS) 665-25 MCG/ACT SUSP   Other Visit Diagnoses     Sore throat    -  Primary   Acute, ongoing. Strep test done today and is negative.   Relevant Orders   Rapid Strep screen([facility])        Follow up plan: Return in about 3 weeks (around 11/26/2022), or if symptoms worsen or fail to improve.

## 2022-11-05 NOTE — Telephone Encounter (Signed)
  Chief Complaint: Left swollen lymph node in neck, sore throat, mild congestion in back of throat, mild sinus pressure. Symptoms: above Frequency: Started yesterday Pertinent Negatives: Patient denies fever or productive cough.   Disposition: [] ED /[] Urgent Care (no appt availability in office) / [x] Appointment(In office/virtual)/ []  [facility]/ [] Home Care/ [] Refused Recommended Disposition /[] [facility]/ []  Follow-up with PCP Additional Notes: Appt made for today at 2:20 with Prescott Gum, NP.

## 2022-11-05 NOTE — Patient Instructions (Signed)
Try antihistamine in the morning instead of night; try Zyrtec and Xyzal

## 2022-11-05 NOTE — Telephone Encounter (Signed)
Reason for Disposition  [1] Sore throat with cough/cold symptoms AND [2] present < 5 days  Answer Assessment - Initial Assessment Questions 1. ONSET: "When did the throat start hurting?" (Hours or days ago)      It's all on the left side.   Started yesterday.    My lymph node on left side and my throat is sore on the left side.    I have swollen.    I can still breath.   The gland is hard and tender.    I don't feel warm. 2. SEVERITY: "How bad is the sore throat?" (Scale 1-10; mild, moderate or severe)   - MILD (1-3):  Doesn't interfere with eating or normal activities.   - MODERATE (4-7): Interferes with eating some solids and normal activities.   - SEVERE (8-10):  Excruciating pain, interferes with most normal activities.   - SEVERE WITH DYSPHAGIA (10): Can't swallow liquids, drooling.     Moderate on left side Last night I have congestion in my throat and I'm just starting to cough.    3. STREP EXPOSURE: "Has there been any exposure to strep within the past week?" If Yes, ask: "What type of contact occurred?"      No 4.  VIRAL SYMPTOMS: "Are there any symptoms of a cold, such as a runny nose, cough, hoarse voice or red eyes?"      Hoarse voice, sinus pressure but not this morning.   No headaches.    5. FEVER: "Do you have a fever?" If Yes, ask: "What is your temperature, how was it measured, and when did it start?"     I don't think so. I did cough drops.     6. PUS ON THE TONSILS: "Is there pus on the tonsils in the back of your throat?"     Not looked     7. OTHER SYMPTOMS: "Do you have any other symptoms?" (e.g., difficulty breathing, headache, rash)     I thought it was because I'm in a cold room.    Wed. I slept with the door closed and I think that is what's doing it. 8. PREGNANCY: "Is there any chance you are pregnant?" "When was your last menstrual period?"     N/A  Protocols used: Sore Throat-A-AH

## 2022-11-06 ENCOUNTER — Ambulatory Visit
Admission: EM | Admit: 2022-11-06 | Discharge: 2022-11-06 | Disposition: A | Discharge status: Home / Self Care | Payer: Medicare HMO | Attending: Physician Assistant | Admitting: Physician Assistant

## 2022-11-06 DIAGNOSIS — J3089 Other allergic rhinitis: Secondary | ICD-10-CM

## 2022-11-06 DIAGNOSIS — J040 Acute laryngitis: Secondary | ICD-10-CM | POA: Diagnosis not present

## 2022-11-06 LAB — POCT RAPID STREP A (OFFICE): Rapid Strep A Screen: NEGATIVE

## 2022-11-06 NOTE — Discharge Instructions (Signed)
Recommend you drink plenty of fluids Use a humidifier at bedtime Can take Ibuprofen as needed Continue with flonase

## 2022-11-06 NOTE — ED Provider Notes (Signed)
Jaime Freeman    CSN: [id number] Arrival date & time: 11/06/22  1333      History   Chief Complaint Chief Complaint  Patient presents with   Sore Throat    HPI Jaime Freeman is a 67 y.o. female.   Patient presents with sore throat and hoarseness that started several days ago.  She reports symptoms are worse at night.  She she reports it feels like her throat is closing and has caused her to feel panicky.  She was seen by her PCP yesterday and diagnosed with seasonal allergies.  Prescription for Ryaltris nasal spray sent in strep negative.  She reports the Ryaltris nasal spray was very expensive waiting for her to come into the pharmacy on Monday.  She denies current wheezing or shortness of breath.  Denies congestion, fever    Past Medical History:  Diagnosis Date   Allergic rhinitis    Angio-edema    Anxiety    Bipolar affective disorder (HCC)    Depression    Hiatal hernia    Hypertension    IFG (impaired fasting glucose)    Lithium toxicity 11/10/2017   Menopausal state    Morbid obesity (HCC)    OCD (obsessive compulsive disorder)    Panic disorder    Restrictive lung disease    Urticaria    Vitamin B12 deficiency    Vitamin D deficiency disease     Patient Active Problem List   Diagnosis Date Noted   Allergic rhinitis due to allergen 11/05/2022   Seasonal allergic rhinitis due to pollen 08/23/2022   Advance directive discussed with patient 12/08/2021   Senile purpura (HCC) 12/08/2021   Age-related cataract of both eyes 12/08/2021   Polyneuropathy associated with underlying disease (HCC) 12/08/2021   CKD (chronic kidney disease) stage 1, GFR 90 ml/min or greater 06/07/2017   Panic attacks 08/13/2015   Insomnia 08/13/2015   Osteoarthritis of both knees 12/25/2014   OCD (obsessive compulsive disorder) 12/25/2014   Dyslipidemia 12/25/2014   Kidney lesion 12/25/2014   Fatty liver 12/25/2014   Morbid obesity (HCC)    Benign hypertensive renal  disease    IFG (impaired fasting glucose)    Vitamin D deficiency disease    Vitamin B12 deficiency    Depression    Anxiety    Restrictive lung disease     Past Surgical History:  Procedure Laterality Date   CESAREAN SECTION     DILATION AND CURETTAGE OF UTERUS     TONSILLECTOMY      OB History   No obstetric history on file.      Home Medications    Prior to Admission medications   Medication Sig Start Date End Date Taking? Authorizing Provider  albuterol (PROVENTIL) (2.5 MG/3ML) 0.083% nebulizer solution Take 3 mLs (2.5 mg total) by nebulization every 6 (six) hours as needed for wheezing or shortness of breath. 08/23/22   Laural Benes, Megan P, DO  albuterol (VENTOLIN HFA) 108 (90 Base) MCG/ACT inhaler Inhale 2 puffs into the lungs every 4 (four) hours as needed for wheezing or shortness of breath. 08/23/22   Laural Benes, Megan P, DO  aspirin EC 81 MG tablet Take 81 mg by mouth daily.    [provider]  benazepril (LOTENSIN) 20 MG tablet Take 1 tablet (20 mg total) by mouth daily. 08/23/22   Johnson, Megan P, DO  bisacodyl (DULCOLAX) 5 MG EC tablet Take 5 mg by mouth daily as needed for moderate constipation.    [provider]  clonazePAM (KLONOPIN) 0.5 MG tablet Take 1 tablet (0.5 mg total) by mouth 2 (two) times daily as needed for anxiety. 10/14/22   Olevia Perches P, DO  diclofenac Sodium (VOLTAREN) 1 % GEL Apply 4 g topically 4 (four) times daily. 08/31/22   Johnson, Megan P, DO  etodolac (LODINE) 400 MG tablet Take 1 tablet (400 mg total) by mouth 2 (two) times daily as needed. 08/23/22   Johnson, Megan P, DO  fluticasone (FLONASE) 50 MCG/ACT nasal spray Place 2 sprays into both nostrils daily. 08/23/22   Olevia Perches P, DO  Multiple Vitamin (MULTI VITAMIN PO) Take by mouth.    [provider]  mupirocin ointment (BACTROBAN) 2 % Apply 1 Application topically 2 (two) times daily. 02/03/22   Mecum, Erin E, PA-C  nystatin ointment (MYCOSTATIN) Apply 1  Application topically 2 (two) times daily. 12/08/21   Johnson, Megan P, DO  Olopatadine-Mometasone (RYALTRIS) [phone] MCG/ACT SUSP Place 2 puffs into both nostrils 2 (two) times daily for 8 days. 11/05/22 11/13/22  Weber Cooks, NP  omeprazole (PRILOSEC) 20 MG capsule Take 1 capsule (20 mg total) by mouth daily. 08/23/22   Johnson, Megan P, DO  QUEtiapine (SEROQUEL) 25 MG tablet TAKE ONE TO TWO TABLETS BY MOUTH DAILY AT BEDTIME. MAY TAKE ADDITIONAL ONE-HALF TABLET UP TO THREE TIMES DAILY AS NEEDED FOR ANXIETY 08/23/22   Johnson, Megan P, DO  senna (SENOKOT) 8.6 MG tablet Take 1 tablet by mouth daily. Take 2 tablets at night    [provider]    Family History Family History  Problem Relation Age of Onset   Heart disease Mother    Heart attack Mother    Hypertension Mother    Cancer Father        GI tract   Hypertension Father    Cancer Maternal Grandmother        gallbladder   Heart disease Maternal Grandfather    COPD Neg Hx    Diabetes Neg Hx    Stroke Neg Hx     Social History Social History   Tobacco Use   Smoking status: Former    Current packs/day: 0.00    Types: Cigarettes    Quit date: 04/27/1987    Years since quitting: 35.5   Smokeless tobacco: Never  Vaping Use   Vaping status: Never Used  Substance Use Topics   Alcohol use: No   Drug use: No     Allergies   Codeine, Dynacirc [isradipine], Guaifenesin & derivatives, Influenza vaccines, Other, Sudafed [pseudoephedrine], Zithromax [azithromycin], and Prednisone   Review of Systems Review of Systems  Constitutional:  Negative for chills and fever.  HENT:  Positive for sore throat and voice change. Negative for ear pain.   Eyes:  Negative for pain and visual disturbance.  Respiratory:  Negative for cough and shortness of breath.   Cardiovascular:  Negative for chest pain and palpitations.  Gastrointestinal:  Negative for abdominal pain and vomiting.  Genitourinary:  Negative for dysuria and  hematuria.  Musculoskeletal:  Negative for arthralgias and back pain.  Skin:  Negative for color change and rash.  Neurological:  Negative for seizures and syncope.  All other systems reviewed and are negative.    Physical Exam Triage Vital Signs ED Triage Vitals  Encounter Vitals Group     BP 11/06/22 1415 (!) 142/81     Systolic BP Percentile --      Diastolic BP Percentile --      Pulse  Rate 11/06/22 1415 84     Resp 11/06/22 1415 18     Temp 11/06/22 1415 98.9 F (37.2 C)     Temp src --      SpO2 11/06/22 1415 98 %     Weight --      Height --      Head Circumference --      Peak Flow --      Pain Score 11/06/22 1409 2     Pain Loc --      Pain Education --      Exclude from Growth Chart --    No data found.  Updated Vital Signs BP (!) 142/81   Pulse 84   Temp 98.9 F (37.2 C)   Resp 18   SpO2 98%   Visual Acuity Right Eye Distance:   Left Eye Distance:   Bilateral Distance:    Right Eye Near:   Left Eye Near:    Bilateral Near:     Physical Exam Vitals and nursing note reviewed.  Constitutional:      General: She is not in acute distress.    Appearance: She is well-developed.  HENT:     Head: Normocephalic and atraumatic.  Eyes:     Conjunctiva/sclera: Conjunctivae normal.  Cardiovascular:     Rate and Rhythm: Normal rate and regular rhythm.     Heart sounds: No murmur heard. Pulmonary:     Effort: Pulmonary effort is normal. No respiratory distress.     Breath sounds: Normal breath sounds.  Abdominal:     Palpations: Abdomen is soft.     Tenderness: There is no abdominal tenderness.  Musculoskeletal:        General: No swelling.     Cervical back: Neck supple.  Skin:    General: Skin is warm and dry.     Capillary Refill: Capillary refill takes less than 2 seconds.  Neurological:     Mental Status: She is alert.  Psychiatric:        Mood and Affect: Mood normal.      UC Treatments / Results  Labs (all labs ordered are listed,  but only abnormal results are displayed) Labs Reviewed  POCT RAPID STREP A (OFFICE)    EKG   Radiology No results found.  Procedures Procedures (including critical care time)  Medications Ordered in UC Medications - No data to display  Initial Impression / Assessment and Plan / UC Course  I have reviewed the triage vital signs and the nursing notes.  Pertinent labs & imaging results that were available during my care of the patient were reviewed by me and considered in my medical decision making (see chart for details).     Laryngitis.  Oropharynx clear in clinic today strep negative.  Advised supportive care.  Vitals within normal limits, patient overall well-appearing in no acute distress.  Lungs clear to auscultation. Final Clinical Impressions(s) / UC Diagnoses   Final diagnoses:  Laryngitis     Discharge Instructions      Recommend you drink plenty of fluids Use a humidifier at bedtime Can take Ibuprofen as needed Continue with flonase   ED Prescriptions   None    PDMP not reviewed this encounter.   Ward, Tylene Fantasia, PA-C 11/06/22 1501

## 2022-11-06 NOTE — ED Triage Notes (Signed)
Patient to Urgent Care with complaints of sore throat that started three days ago. Describes feeling like something is covering her throat/ has the sensation that her throat is closing. Symptoms worse when waking up. Hoarseness. Denies any fevers.  Seen at her pcp's yesterday and diagnosed with seasonal allergies. States they prescribed her Ryaltris but it was very expensive. Negative strep test.

## 2022-11-08 LAB — CULTURE, GROUP A STREP: Strep A Culture: NEGATIVE

## 2022-11-08 LAB — RAPID STREP SCREEN (MED CTR MEBANE ONLY): Strep Gp A Ag, IA W/Reflex: NEGATIVE

## 2022-11-09 ENCOUNTER — Ambulatory Visit: Payer: Self-pay | Admitting: *Deleted

## 2022-11-09 ENCOUNTER — Telehealth: Payer: Self-pay | Admitting: Family Medicine

## 2022-11-09 NOTE — Telephone Encounter (Signed)
Patient called to f/u on her call from earlier about her possible sinus infection and wanted to see if she was getting medication or not so she could get her daughter to pick it up on her way home from work. Please f/u with patient and see TE from this morning

## 2022-11-09 NOTE — Telephone Encounter (Signed)
medication management   Per agent: "Patient states pharmacy advised  Olopatadine-Mometasone (RYALTRIS) [phone] MCG/ACT SUSP was red flagged because of the following ingredient she is allergic too which is polyethylene glycol."      Chief Complaint: Congestion Symptoms: Dry cough "All in my throat, won't come up to cough out." Mild sore throat, nasal drainage. "I get anxious because I feel like I'm not getting oxygen, my throat is stuffed up." Frequency: N Pertinent Negatives: Patient denies NA Disposition: [] ED /[] Urgent Care (no appt availability in office) / [] Appointment(In office/virtual)/ []  [facility]/ [] Home Care/ [] Refused Recommended Disposition /[] [place] [facility]/ [x]  Follow-up with PCP Additional Notes:  Seen at [facility]  7/12. UC 7/13. Pt initially called to report :  Patient states pharmacy advised  Olopatadine-Mometasone (RYALTRIS) [phone] MCG/ACT SUSP was red flagged because of the following ingredient she is allergic too which is polyethylene glycol.     Triaged as pt mentioned worsening symptoms. Pt did not start Ryaltris due to allergies.  Per [facility] note if no improvement will treat for sinus infection. Pt states can only take Augmentin. Care advise provided, pt verbalizes understanding. Please advise.     Reason for Disposition  SEVERE coughing spells (e.g., whooping sound after coughing, vomiting after coughing)  Answer Assessment - Initial Assessment Questions 1. ONSET: "When did the cough begin?"      Seen at [facility] 7/12, UC 7/13 2. SEVERITY: "How bad is the cough today?"      "All in throat, clumps in there." 3. SPUTUM: "Describe the color of your sputum" (none, dry cough; clear, white, yellow, green)     "Won't come up." 4. HEMOPTYSIS: "Are you coughing up any blood?" If so ask: "How much?" (flecks, streaks, tablespoons, etc.)     No 5. DIFFICULTY BREATHING: "Are you having difficulty breathing?" If Yes, ask: "How bad is it?" (e.g., mild, moderate,  severe)    - MILD: No SOB at rest, mild SOB with walking, speaks normally in sentences, can lie down, no retractions, pulse < 100.    - MODERATE: SOB at rest, SOB with minimal exertion and prefers to sit, cannot lie down flat, speaks in phrases, mild retractions, audible wheezing, pulse 100-120.    - SEVERE: Very SOB at rest, speaks in single words, struggling to breathe, sitting hunched forward, retractions, pulse > 120      "When phlegm won't come up." 6. FEVER: "Do you have a fever?" If Yes, ask: "What is your temperature, how was it measured, and when did it start?"     No 7. CARDIAC HISTORY: "Do you have any history of heart disease?" (e.g., heart attack, congestive heart failure)       8. LUNG HISTORY: "Do you have any history of lung disease?"  (e.g., pulmonary embolus, asthma, emphysema)      9. PE RISK FACTORS: "Do you have a history of blood clots?" (or: recent major surgery, recent prolonged travel, bedridden)      10. OTHER SYMPTOMS: "Do you have any other symptoms?" (e.g., runny nose, wheezing, chest pain)       Hoarse, mild sore throat, fatigued, mouth dry.  Protocols used: Cough - Acute Non-Productive-A-AH

## 2022-11-09 NOTE — Telephone Encounter (Signed)
Advised that if not better in 7 days we'd treat for sinus infection. That was 4 days ago. I'd advise OTC medicine- if not better by Friday, let us know and we can call in an antibiotic.

## 2022-11-10 NOTE — Telephone Encounter (Signed)
Please advise 

## 2022-11-10 NOTE — Telephone Encounter (Signed)
Pt is calling in because she states her symptoms are worsening. Pt says she was told to wait until Friday before medication was prescribed, but pt says she is nervous because her symptoms are not improving. Pt is requesting Dr. Henriette Combs nurse give her a call before two o'clock today to discuss medications or more advice on what she can do.  Pt says she says her symptoms were getting better on Monday, but Tuesday night and this morning things were worse. Pt says she feels as if she is "dried out"

## 2022-11-10 NOTE — Telephone Encounter (Signed)
The patient called in stating she has still had a terrible sore throat and cough and she cannot take any OTC medicines as she has allergies to most of them. She says the only thing she can take is Augmentin that doesn't bother her. She is wondering if this may be called in to   [facility] [address] - [place], [place] - [address] Phone: [phone]  Fax: [phone]     Please assist patient further. 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

## 2022-11-10 NOTE — Telephone Encounter (Signed)
Called and spoke with pt, she states that she can't take any OTC medications due to her allergies. She can only use tylenol and cough drops, states that she has been dealing with this since last Thursday and has only got worse. Please advise

## 2022-11-10 NOTE — Telephone Encounter (Signed)
On day 5 of symptoms. Advise symptomatic care, and if not better on Friday, will call in augmentin.

## 2022-11-10 NOTE — Telephone Encounter (Signed)
Viruses usually last 5-7 days and can last up to 10. Continue the tylenol and cough drops and she can use nasal saline and if she's not better by Friday, let us know.

## 2022-11-10 NOTE — Telephone Encounter (Signed)
Called and notified patient of Dr. Henriette Combs message. Patient states that she cannot take any OTC medications. Patient states she is having a lot of congestion but states she will do what Dr. Laural Benes says.

## 2022-11-10 NOTE — Telephone Encounter (Signed)
Please see other messages on same topic.

## 2022-11-12 ENCOUNTER — Ambulatory Visit: Payer: Self-pay | Admitting: *Deleted

## 2022-11-12 MED ORDER — AMOXICILLIN-POT CLAVULANATE 875-125 MG PO TABS: 1.0000 | ORAL_TABLET | Freq: Two times a day (BID) | ORAL | 0 refills | Status: AC

## 2022-11-12 NOTE — Telephone Encounter (Signed)
Reason for Disposition  [1] MILD difficulty breathing (e.g., minimal/no SOB at rest, SOB with walking, pulse <100) AND [2] NEW-onset or WORSE than normal    Instructed by Dr. Laural Benes to call in for an antibiotic if not better by today (Friday)  Answer Assessment - Initial Assessment Questions 1. RESPIRATORY STATUS: "Describe your breathing?" (e.g., wheezing, shortness of breath, unable to speak, severe coughing)      I'm having shortness of breath, drainage down the back of my throat, sore throat, runny nose intermittently, and congestion in my throat and in my chest.    I'm not coughing anything up.   No lung problems. No fever now.  But I did the night before. 2. ONSET: "When did this breathing problem begin?"      Thur. Night of last week. 3. PATTERN "Does the difficult breathing come and go, or has it been constant since it started?"      I saw her on Friday.   On Sat. I felt worse and I went to the walk in clinic and they said it was allergies.    But now it's starting to get worse.    I called Dr. Laural Benes and she told me if I was not better by Friday she would call in an antibiotic for me. It felt like a lot of congestion in my throat.     4. SEVERITY: "How bad is your breathing?" (e.g., mild, moderate, severe)    - MILD: No SOB at rest, mild SOB with walking, speaks normally in sentences, can lie down, no retractions, pulse < 100.    - MODERATE: SOB at rest, SOB with minimal exertion and prefers to sit, cannot lie down flat, speaks in phrases, mild retractions, audible wheezing, pulse 100-120.    - SEVERE: Very SOB at rest, speaks in single words, struggling to breathe, sitting hunched forward, retractions, pulse > 120      I have a sore throat and my voice is hoarse.       I can't take anything OTC.       This morning I feel worse.    Yesterday I thought I was feeling better but now I feel worse this morning. 5. RECURRENT SYMPTOM: "Have you had difficulty breathing before?" If Yes, ask:  "When was the last time?" and "What happened that time?"      Not asked 6. CARDIAC HISTORY: "Do you have any history of heart disease?" (e.g., heart attack, angina, bypass surgery, angioplasty)      Not asked 7. LUNG HISTORY: "Do you have any history of lung disease?"  (e.g., pulmonary embolus, asthma, emphysema)     No 8. CAUSE: "What do you think is causing the breathing problem?"      I've been fighting this what ever it is all week and I just don't seem to be getting better.   Dr. Laural Benes said she would call me in an antibiotic if I wasn't better by today (Friday) 9. OTHER SYMPTOMS: "Do you have any other symptoms? (e.g., dizziness, runny nose, cough, chest pain, fever)     Intermittent runny nose, a lot of post nasal drip, sore throat, chest congestion 10. O2 SATURATION MONITOR:  "Do you use an oxygen saturation monitor (pulse oximeter) at home?" If Yes, ask: "What is your reading (oxygen level) today?" "What is your usual oxygen saturation reading?" (e.g., 95%)       N/A 11. PREGNANCY: "Is there any chance you are pregnant?" "When was your last menstrual  period?"       N/A due to age 67. TRAVEL: "Have you traveled out of the country in the last month?" (e.g., travel history, exposures)       Not asked  Protocols used: Breathing Difficulty-A-AH

## 2022-11-12 NOTE — Telephone Encounter (Signed)
Attempted to contact pt no answer left message regarding medication being sent to pharmacy.

## 2022-11-12 NOTE — Telephone Encounter (Signed)
  Chief Complaint: Instructed by Dr. Laural Benes to call in for an antibiotic if she was not better by Friday, today.   Symptoms: A lot of post nasal drip, sore throat, chest congestion, intermittent runny nose and shortness of breath. Frequency: Today feeling worse.   Being sick all week.   Spoke with Dr. Laural Benes earlier in the week and also been to urgent care Pertinent Negatives: Patient denies fever Disposition: [] ED /[] Urgent Care (no appt availability in office) / [] Appointment(In office/virtual)/ []  [facility]/ [] Home Care/ [] Refused Recommended Disposition /[] [place] [facility]/ [x]  Follow-up with PCP Additional Notes: I'm sending a message to Dr. Laural Benes regarding your need for an antibiotic.    Please call into the [facility] pharmacy

## 2022-11-12 NOTE — Addendum Note (Signed)
Addended by: Aura Dials T on: 11/12/2022 12:20 PM   Modules accepted: Orders

## 2022-11-18 ENCOUNTER — Telehealth: Payer: Self-pay | Admitting: Family Medicine

## 2022-11-18 NOTE — Telephone Encounter (Signed)
Jaime Freeman is calling in to check the status of the Prior Authorization. Jaime Freeman says if the Authorization isn't sent in today, pt's appointment will be canceled.

## 2022-11-18 NOTE — Telephone Encounter (Addendum)
Vikki Ports called from [facility] and stated their office needs a prior authorization for patient for UE VENOUS by tomorrow, 7.26.2024. Please Advise.  [facility] #: [phone]

## 2022-11-18 NOTE — Telephone Encounter (Signed)
Brayton Caves can you look into this ASAP please?

## 2022-11-25 ENCOUNTER — Other Ambulatory Visit: Payer: Self-pay | Admitting: Family Medicine

## 2022-11-26 ENCOUNTER — Encounter: Payer: Self-pay | Admitting: Family Medicine

## 2022-11-26 ENCOUNTER — Ambulatory Visit (INDEPENDENT_AMBULATORY_CARE_PROVIDER_SITE_OTHER): Payer: Medicare HMO | Admitting: Family Medicine

## 2022-11-26 DIAGNOSIS — F332 Major depressive disorder, recurrent severe without psychotic features: Secondary | ICD-10-CM | POA: Diagnosis not present

## 2022-11-26 NOTE — Progress Notes (Signed)
BP (!) 162/89   Pulse 64   Temp 99.2 F (37.3 C) (Oral)   Ht 5\' 1"  (1.549 m)   Wt 196 lb 12.8 oz (89.3 kg)   SpO2 98%   BMI 37.19 kg/m    Subjective:    Patient ID: Jaime Freeman, female    DOB: Jan 30, 1956, 67 y.o.   MRN: [id number]  HPI: Jaime Freeman is a 67 y.o. female  Chief Complaint  Patient presents with   Anxiety   Depression   ANXIETY/STRESS Duration: chronic Status:exacerbated Anxious mood: yes  Excessive worrying: yes Irritability: no  Sweating: no Nausea: no Palpitations:yes Hyperventilation: yes Panic attacks: yes Agoraphobia: no  Obscessions/compulsions: no Depressed mood: yes    11/26/2022    5:03 PM 10/14/2022    2:01 PM 09/30/2022    3:45 PM 08/23/2022    2:21 PM 03/11/2022    2:53 PM  Depression screen PHQ 2/9  Decreased Interest 1 2 1  0 0  Down, Depressed, Hopeless 2 2 1 1  0  PHQ - 2 Score 3 4 2 1  0  Altered sleeping 1 1 0 0 0  Tired, decreased energy 1 2 0 0 0  Change in appetite 1 2 0 0 0  Feeling bad or failure about yourself  0 0 0 0 0  Trouble concentrating 1 2 0 1 0  Moving slowly or fidgety/restless 0 0 0 0 0  Suicidal thoughts 0 0 0 0 0  PHQ-9 Score 7 11 2 2  0  Difficult doing work/chores Not difficult at all Somewhat difficult Not difficult at all Somewhat difficult Not difficult at all      11/26/2022    5:03 PM 10/14/2022    2:01 PM 09/30/2022    3:45 PM 08/23/2022    2:21 PM  GAD 7 : Generalized Anxiety Score  Nervous, Anxious, on Edge 1 0 1 1  Control/stop worrying 1 1 0 1  Worry too much - different things 1 2  1   Trouble relaxing 1 1 1 1   Restless 1 1 0 0  Easily annoyed or irritable 0 0 1 1  Afraid - awful might happen 0 0 0 1  Total GAD 7 Score 5 5  6   Anxiety Difficulty Not difficult at all  Not difficult at all Somewhat difficult   Anhedonia: no Weight changes: no Insomnia: yes hard to fall asleep  Hypersomnia: no Fatigue/loss of energy: yes Feelings of worthlessness: yes Feelings of guilt: yes Impaired  concentration/indecisiveness: no Suicidal ideations: no  Crying spells: yes Recent Stressors/Life Changes: yes   Relationship problems: no   Family stress: yes     Financial stress: yes    Job stress: yes    Recent death/loss: yes   Relevant past medical, surgical, family and social history reviewed and updated as indicated. Interim medical history since our last visit reviewed. Allergies and medications reviewed and updated.  Review of Systems  Constitutional: Negative.   Respiratory: Negative.    Cardiovascular: Negative.   Musculoskeletal: Negative.   Skin: Negative.   Psychiatric/Behavioral:  Positive for agitation and dysphoric mood. Negative for behavioral problems, confusion, decreased concentration, hallucinations, self-injury, sleep disturbance and suicidal ideas. The patient is nervous/anxious. The patient is not hyperactive.     Per HPI unless specifically indicated above     Objective:    BP (!) 162/89   Pulse 64   Temp 99.2 F (37.3 C) (Oral)   Ht 5\' 1"  (1.549 m)   Wt 196  lb 12.8 oz (89.3 kg)   SpO2 98%   BMI 37.19 kg/m   Wt Readings from Last 3 Encounters:  11/26/22 196 lb 12.8 oz (89.3 kg)  11/05/22 198 lb (89.8 kg)  10/14/22 197 lb 6.4 oz (89.5 kg)    Physical Exam Vitals and nursing note reviewed.  Constitutional:      General: She is not in acute distress.    Appearance: Normal appearance. She is not ill-appearing, toxic-appearing or diaphoretic.  HENT:     Head: Normocephalic and atraumatic.     Right Ear: External ear normal.     Left Ear: External ear normal.     Nose: Nose normal.     Mouth/Throat:     Mouth: Mucous membranes are moist.     Pharynx: Oropharynx is clear.  Eyes:     General: No scleral icterus.       Right eye: No discharge.        Left eye: No discharge.     Extraocular Movements: Extraocular movements intact.     Conjunctiva/sclera: Conjunctivae normal.     Pupils: Pupils are equal, round, and reactive to light.   Cardiovascular:     Rate and Rhythm: Normal rate and regular rhythm.     Pulses: Normal pulses.     Heart sounds: Normal heart sounds. No murmur heard.    No friction rub. No gallop.  Pulmonary:     Effort: Pulmonary effort is normal. No respiratory distress.     Breath sounds: Normal breath sounds. No stridor. No wheezing, rhonchi or rales.  Chest:     Chest wall: No tenderness.  Musculoskeletal:        General: Normal range of motion.     Cervical back: Normal range of motion and neck supple.  Skin:    General: Skin is warm and dry.     Capillary Refill: Capillary refill takes less than 2 seconds.     Coloration: Skin is not jaundiced or pale.     Findings: No bruising, erythema, lesion or rash.  Neurological:     General: No focal deficit present.     Mental Status: She is alert and oriented to person, place, and time. Mental status is at baseline.  Psychiatric:        Mood and Affect: Mood is anxious. Affect is tearful.        Behavior: Behavior normal.        Thought Content: Thought content normal.        Judgment: Judgment normal.     Results for orders placed or performed during the hospital encounter of 11/06/22  POCT rapid strep A  Result Value Ref Range   Rapid Strep A Screen Negative Negative      Assessment & Plan:   Problem List Items Addressed This Visit       Other   Depression    Not doing well right now. Encouraged her to take her klonopin if she needs it. Will increase her seroquel to 1/2 tab in the AM and 2 pills in the PM. Check in by phone in 2 weeks, if not doing better, will change to seroquel 50mg  XR. Call with any concerns. Continue to monitor.         Follow up plan: Return in about 6 weeks (around 01/07/2023).

## 2022-11-26 NOTE — Assessment & Plan Note (Addendum)
Not doing well right now. Encouraged her to take her klonopin if she needs it. Will increase her seroquel to 1/2 tab in the AM and 2 pills in the PM. Check in by phone in 2 weeks, if not doing better, will change to seroquel 50mg  XR. Call with any concerns. Continue to monitor.

## 2022-11-26 NOTE — Telephone Encounter (Signed)
Requested Prescriptions  Pending Prescriptions Disp Refills   benazepril (LOTENSIN) 20 MG tablet [Pharmacy Med Name: BENAZEPRIL 20 MG TAB[*]] 90 tablet 1    Sig: TAKE ONE TABLET BY MOUTH ONE TIME DAILY     Cardiovascular:  ACE Inhibitors Failed - 11/25/2022  6:49 PM      Failed - Last BP in normal range    BP Readings from Last 1 Encounters:  11/06/22 (!) 142/81         Passed - Cr in normal range and within 180 days    Creatinine, Ser  Date Value Ref Range Status  08/23/2022 0.68 0.57 - 1.00 mg/dL Final         Passed - K in normal range and within 180 days    Potassium  Date Value Ref Range Status  08/23/2022 5.2 3.5 - 5.2 mmol/L Final         Passed - Patient is not pregnant      Passed - Valid encounter within last 6 months    Recent Outpatient Visits           3 weeks ago Sore throat   [place] [facility] [place], Sherran Needs, NP   1 month ago Grief   [place] [facility] [place], Ramos, DO   1 month ago Acute pain of right shoulder   [place] [facility] Larae Grooms, NP   2 months ago Acute pain of right shoulder   [place] [facility] [place], Megan P, DO   3 months ago Benign hypertensive renal disease   [place] [facility] [place], Oralia Rud, DO       Future Appointments             Today Dorcas Carrow, DO [place] [facility]

## 2022-12-02 ENCOUNTER — Encounter: Payer: Self-pay | Admitting: Family Medicine

## 2022-12-02 ENCOUNTER — Ambulatory Visit (INDEPENDENT_AMBULATORY_CARE_PROVIDER_SITE_OTHER): Payer: Medicare HMO | Admitting: Family Medicine

## 2022-12-02 VITALS — BP 167/83 | HR 68 | Temp 97.8°F | Wt 194.6 lb

## 2022-12-02 DIAGNOSIS — R6884 Jaw pain: Secondary | ICD-10-CM | POA: Diagnosis not present

## 2022-12-02 MED ORDER — AMOXICILLIN-POT CLAVULANATE 875-125 MG PO TABS: 1.0000 | ORAL_TABLET | Freq: Two times a day (BID) | ORAL | 0 refills | Status: DC

## 2022-12-02 NOTE — Progress Notes (Signed)
BP (!) 167/83   Pulse 68   Temp 97.8 F (36.6 C) (Oral)   Wt 194 lb 9.6 oz (88.3 kg)   SpO2 99%   BMI 36.77 kg/m    Subjective:    Patient ID: Jaime Freeman, female    DOB: [birth year], 67 y.o.   MRN: [id number]  HPI: Jaime Freeman is a 67 y.o. female  Chief Complaint  Patient presents with   Jaw Pain   NECK PAIN under her jaw on the L side Duration: couple of weeks Status: stable Treatments attempted: nothing  Relief with NSAIDs?:  No NSAIDs Taken Location:Left Severity: mild Quality: ache Frequency: constant Radiation: none Aggravating factors: touching it Alleviating factors: nothing + dental pain ?swelling  Relevant past medical, surgical, family and social history reviewed and updated as indicated. Interim medical history since our last visit reviewed. Allergies and medications reviewed and updated.  Review of Systems  Constitutional: Negative.   HENT:  Positive for dental problem. Negative for congestion, drooling, ear discharge, ear pain, facial swelling, hearing loss, mouth sores, nosebleeds, postnasal drip, rhinorrhea, sinus pressure, sinus pain, sneezing, sore throat, tinnitus, trouble swallowing and voice change.   Respiratory: Negative.    Cardiovascular: Negative.   Gastrointestinal: Negative.   Musculoskeletal: Negative.   Psychiatric/Behavioral: Negative.      Per HPI unless specifically indicated above     Objective:    BP (!) 167/83   Pulse 68   Temp 97.8 F (36.6 C) (Oral)   Wt 194 lb 9.6 oz (88.3 kg)   SpO2 99%   BMI 36.77 kg/m   Wt Readings from Last 3 Encounters:  12/02/22 194 lb 9.6 oz (88.3 kg)  11/26/22 196 lb 12.8 oz (89.3 kg)  11/05/22 198 lb (89.8 kg)    Physical Exam Vitals and nursing note reviewed.  Constitutional:      General: She is not in acute distress.    Appearance: Normal appearance. She is not ill-appearing, toxic-appearing or diaphoretic.  HENT:     Head: Normocephalic and atraumatic.     Comments:  Last tooth on the L bottom has irritation and mild swelling around the gum    Right Ear: Tympanic membrane, ear canal and external ear normal.     Left Ear: Tympanic membrane, ear canal and external ear normal.     Nose: Nose normal. No congestion or rhinorrhea.     Mouth/Throat:     Mouth: Mucous membranes are moist.     Pharynx: Oropharynx is clear. No oropharyngeal exudate or posterior oropharyngeal erythema.  Eyes:     General: No scleral icterus.       Right eye: No discharge.        Left eye: No discharge.     Extraocular Movements: Extraocular movements intact.     Conjunctiva/sclera: Conjunctivae normal.     Pupils: Pupils are equal, round, and reactive to light.  Neck:     Vascular: No carotid bruit.     Comments: No swelling or lymphadenopathy submandibular L Cardiovascular:     Rate and Rhythm: Normal rate and regular rhythm.     Pulses: Normal pulses.     Heart sounds: Normal heart sounds. No murmur heard.    No friction rub. No gallop.  Pulmonary:     Effort: Pulmonary effort is normal. No respiratory distress.     Breath sounds: Normal breath sounds. No stridor. No wheezing, rhonchi or rales.  Chest:     Chest wall: No tenderness.  Musculoskeletal:        General: Normal range of motion.     Cervical back: Normal range of motion and neck supple. No rigidity or tenderness.  Lymphadenopathy:     Cervical: No cervical adenopathy.  Skin:    General: Skin is warm and dry.     Capillary Refill: Capillary refill takes less than 2 seconds.     Coloration: Skin is not jaundiced or pale.     Findings: No bruising, erythema, lesion or rash.  Neurological:     General: No focal deficit present.     Mental Status: She is alert and oriented to person, place, and time. Mental status is at baseline.  Psychiatric:        Mood and Affect: Mood normal.        Behavior: Behavior normal.        Thought Content: Thought content normal.        Judgment: Judgment normal.      Results for orders placed or performed during the hospital encounter of 11/06/22  POCT rapid strep A  Result Value Ref Range   Rapid Strep A Screen Negative Negative      Assessment & Plan:   Problem List Items Addressed This Visit   None Visit Diagnoses     Jaw pain    -  Primary   EKG normal. ?dental infection. Will treat with augmentin and follow up with her dentist. If not getting better and normal at dentist- will check [place]   Relevant Orders   EKG 12-Lead (Completed)        Follow up plan: Return if symptoms worsen or fail to improve.

## 2022-12-02 NOTE — Progress Notes (Signed)
Interpreted by me today. NSR at 66bpm. PVC. No ST segment changes.

## 2022-12-21 ENCOUNTER — Ambulatory Visit (INDEPENDENT_AMBULATORY_CARE_PROVIDER_SITE_OTHER): Payer: Medicare HMO | Admitting: Family Medicine

## 2022-12-21 VITALS — BP 149/84 | HR 76 | Temp 98.6°F | Ht 63.78 in | Wt 192.8 lb

## 2022-12-21 DIAGNOSIS — J069 Acute upper respiratory infection, unspecified: Secondary | ICD-10-CM

## 2022-12-21 NOTE — Assessment & Plan Note (Deleted)
Acute, ongoing. Strep negative, awaiting COVID. Augmentin BID for 7 days to prevent symptoms from worsening. Flonase PRN daily. Referral placed for ENT. Return if symptoms worsen or fail to improve.

## 2022-12-21 NOTE — Patient Instructions (Addendum)
Try Antihistamine Claritin or Zyrtec or Xyzal   Stay hydrated with plenty of fluids

## 2022-12-21 NOTE — Assessment & Plan Note (Signed)
Acute, ongoing. Strep negative, awaiting COVID. Flonase daily, advised to switch from Zyrtec to Claritin daily. If symptoms do not improve in 5-7 days will add Augmentin BID.

## 2022-12-21 NOTE — Progress Notes (Signed)
BP (!) 149/84   Pulse 76   Temp 98.6 F (37 C) (Oral)   Ht 5' 3.78" (1.62 m)   Wt 192 lb 12.8 oz (87.5 kg)   BMI 33.32 kg/m    Subjective:    Patient ID: Jaime Freeman, female    DOB: [birth year], 67 y.o.   MRN: [id number]  HPI: Jaime Freeman is a 67 y.o. female  Chief Complaint  Patient presents with   Sinus Problem   UPPER RESPIRATORY TRACT INFECTION She was recently treated for sinusitis in July 2024 with Augmentin that provided relief. She has a history of recurrent sinus infections.  Today she complains her symptoms started yesterday morning with shortness of breath and congestion with increased post nasal drip.  Worst symptom:Drainage Fever: no Cough: no Shortness of breath: yes Wheezing: no Chest pain: no Chest tightness: no Chest congestion: yes Nasal congestion: yes Runny nose: no Post nasal drip: yes Sneezing: no Sore throat: yes Swollen glands: no Sinus pressure: yes Headache: no Face pain: no Toothache: no Ear pain: no  Ear pressure: no  Eyes red/itching:no Eye drainage/crusting: no  Vomiting: no Rash: no Fatigue: no Sick contacts: no Strep contacts: no  Context: worse Recurrent sinusitis: yes Relief with OTC cold/cough medications: no  Treatments attempted: none    Relevant past medical, surgical, family and social history reviewed and updated as indicated. Interim medical history since our last visit reviewed. Allergies and medications reviewed and updated.  Review of Systems  Constitutional:  Negative for chills, fatigue and fever.  HENT:  Positive for congestion, postnasal drip, sinus pressure and sore throat. Negative for ear pain, rhinorrhea, sinus pain and sneezing.   Eyes:  Negative for discharge, redness and itching.  Respiratory:  Positive for shortness of breath. Negative for cough, chest tightness and wheezing.   Cardiovascular:  Negative for chest pain.  Gastrointestinal:  Negative for vomiting.  Skin:  Negative for rash.   Neurological:  Negative for headaches.    Per HPI unless specifically indicated above     Objective:    BP (!) 149/84   Pulse 76   Temp 98.6 F (37 C) (Oral)   Ht 5' 3.78" (1.62 m)   Wt 192 lb 12.8 oz (87.5 kg)   BMI 33.32 kg/m   Wt Readings from Last 3 Encounters:  12/21/22 192 lb 12.8 oz (87.5 kg)  12/02/22 194 lb 9.6 oz (88.3 kg)  11/26/22 196 lb 12.8 oz (89.3 kg)    Physical Exam Vitals and nursing note reviewed.  Constitutional:      General: She is awake. She is not in acute distress.    Appearance: Normal appearance. She is well-developed and well-groomed. She is obese. She is not ill-appearing, toxic-appearing or diaphoretic.  HENT:     Head: Normocephalic and atraumatic.     Right Ear: Hearing, tympanic membrane, ear canal and external ear normal. No drainage. There is no impacted cerumen. Tympanic membrane is not erythematous.     Left Ear: Hearing, tympanic membrane, ear canal and external ear normal. No drainage. There is no impacted cerumen. Tympanic membrane is not erythematous.     Nose: Mucosal edema and congestion present.     Right Turbinates: Swollen and pale.     Left Turbinates: Swollen and pale.     Right Sinus: No maxillary sinus tenderness or frontal sinus tenderness.     Left Sinus: No maxillary sinus tenderness or frontal sinus tenderness.     Mouth/Throat:  Mouth: Mucous membranes are moist.     Pharynx: Oropharynx is clear. Posterior oropharyngeal erythema and postnasal drip present. No oropharyngeal exudate.  Eyes:     General: Lids are normal. No scleral icterus.       Right eye: No discharge.        Left eye: No discharge.     Extraocular Movements: Extraocular movements intact.     Conjunctiva/sclera: Conjunctivae normal.     Pupils: Pupils are equal, round, and reactive to light.  Neck:     Vascular: No carotid bruit.  Cardiovascular:     Rate and Rhythm: Normal rate and regular rhythm.     Pulses: Normal pulses.          Radial  pulses are 2+ on the right side and 2+ on the left side.       Posterior tibial pulses are 2+ on the right side and 2+ on the left side.     Heart sounds: Normal heart sounds, S1 normal and S2 normal. No murmur heard.    No friction rub. No gallop.  Pulmonary:     Effort: Pulmonary effort is normal. No accessory muscle usage or respiratory distress.     Breath sounds: Normal breath sounds. No stridor. No wheezing, rhonchi or rales.  Chest:     Chest wall: No tenderness.  Musculoskeletal:        General: Normal range of motion.     Cervical back: Full passive range of motion without pain, normal range of motion and neck supple. No rigidity. No muscular tenderness.     Right lower leg: No edema.     Left lower leg: No edema.  Lymphadenopathy:     Cervical: Cervical adenopathy present.  Skin:    General: Skin is warm and dry.     Capillary Refill: Capillary refill takes less than 2 seconds.     Coloration: Skin is not jaundiced or pale.     Findings: No bruising, erythema, lesion or rash.  Neurological:     General: No focal deficit present.     Mental Status: She is alert and oriented to person, place, and time. Mental status is at baseline.     Cranial Nerves: No cranial nerve deficit.     Sensory: No sensory deficit.     Motor: No weakness.     Coordination: Coordination normal.     Gait: Gait normal.     Deep Tendon Reflexes: Reflexes normal.  Psychiatric:        Attention and Perception: Attention normal.        Mood and Affect: Mood normal.        Speech: Speech normal.        Behavior: Behavior normal. Behavior is cooperative.        Thought Content: Thought content normal.        Judgment: Judgment normal.     Results for orders placed or performed in visit on 12/21/22  Novel Coronavirus, NAA ([facility])   Specimen: Nasopharyngeal(NP) swabs in vial transport medium  Result Value Ref Range   SARS-CoV-2, NAA Not Detected Not Detected  Rapid Strep screen([facility])    Specimen: Other   Other  Result Value Ref Range   Strep Gp A Ag, IA W/Reflex Negative Negative  Culture, Group A Strep   Other  Result Value Ref Range   Strep A Culture WILL FOLLOW       Assessment & Plan:   Problem List Items Addressed This Visit  URI, acute - Primary    Acute, ongoing. Strep negative awaiting culture and COVID. Recommend Flonase daily, switch from Zyrtec to Claritin daily. If symptoms do not improve in 5-7 days will add Augmentin BID.       Relevant Orders   Novel Coronavirus, NAA ([facility]) (Completed)   Rapid Strep screen([facility]) (Completed)     Follow up plan: Return if symptoms worsen or fail to improve.

## 2022-12-22 ENCOUNTER — Telehealth: Payer: Self-pay | Admitting: Family Medicine

## 2022-12-22 LAB — NOVEL CORONAVIRUS, NAA: SARS-CoV-2, NAA: NOT DETECTED

## 2022-12-22 NOTE — Telephone Encounter (Signed)
Pt called in about result of covid tests. I let her know they are not in yet and she will be called when they are.

## 2022-12-23 NOTE — Telephone Encounter (Addendum)
Patient has called back to check on her results, please advise. Patient stated she needs to know these results because she has church tonight and needs to know by 3 pm if all possible.  Patients callback #[phone]   Patient stated she is no better with her ear as well.

## 2022-12-24 LAB — RAPID STREP SCREEN (MED CTR MEBANE ONLY): Strep Gp A Ag, IA W/Reflex: NEGATIVE

## 2022-12-24 LAB — CULTURE, GROUP A STREP: Strep A Culture: NEGATIVE

## 2022-12-30 DIAGNOSIS — S0990XA Unspecified injury of head, initial encounter: Secondary | ICD-10-CM | POA: Diagnosis not present

## 2022-12-31 DIAGNOSIS — M1712 Unilateral primary osteoarthritis, left knee: Secondary | ICD-10-CM | POA: Diagnosis not present

## 2022-12-31 DIAGNOSIS — M1711 Unilateral primary osteoarthritis, right knee: Secondary | ICD-10-CM | POA: Diagnosis not present

## 2022-12-31 DIAGNOSIS — M170 Bilateral primary osteoarthritis of knee: Secondary | ICD-10-CM | POA: Diagnosis not present

## 2023-01-04 ENCOUNTER — Ambulatory Visit (INDEPENDENT_AMBULATORY_CARE_PROVIDER_SITE_OTHER): Payer: Medicare HMO | Admitting: Emergency Medicine

## 2023-01-04 VITALS — Ht 62.0 in | Wt 192.0 lb

## 2023-01-04 DIAGNOSIS — Z1231 Encounter for screening mammogram for malignant neoplasm of breast: Secondary | ICD-10-CM

## 2023-01-04 DIAGNOSIS — Z780 Asymptomatic menopausal state: Secondary | ICD-10-CM

## 2023-01-04 DIAGNOSIS — Z0000 Encounter for general adult medical examination without abnormal findings: Secondary | ICD-10-CM | POA: Diagnosis not present

## 2023-01-04 NOTE — Patient Instructions (Addendum)
Ms. Blee , Thank you for taking time to come for your Medicare Wellness Visit. I appreciate your ongoing commitment to your health goals. Please review the following plan we discussed and let me know if I can assist you in the future.   Referrals/Orders/Follow-Ups/Clinician Recommendations: I have placed an order for a mammogram and bone density test. Please call [facility] @ [phone] to schedule these at your earliest convenience.  This is a list of the screening recommended for you and due dates:  Health Maintenance  Topic Date Due   Mammogram  Never done   DEXA scan (bone density measurement)  Never done   COVID-19 Vaccine (1 - 2023-24 season) Never done   Zoster (Shingles) Vaccine (1 of 2) 02/05/2023*   Medicare Annual Wellness Visit  01/04/2024   Cologuard (Stool DNA test)  12/14/2024   DTaP/Tdap/Td vaccine (2 - Td or Tdap) 07/01/2030   Pneumonia Vaccine  Completed   Hepatitis C Screening  Completed   HPV Vaccine  Aged Out   Flu Shot  Discontinued  *Topic was postponed. The date shown is not the original due date.    Advanced directives: (ACP Link)Information on Advanced Care Planning can be found at [facility] Directives Advance Health Care Directives ([email or web address]   Next Medicare Annual Wellness Visit scheduled for next year: Yes, 01/10/24 @ 2:15pm  Fall Prevention in the Home, Adult Falls can cause injuries and affect people of all ages. There are many simple things that you can do to make your home safe and to help prevent falls. If you need it, ask for help making these changes. What actions can I take to prevent falls? General information Use good lighting in all rooms. Make sure to: Replace any light bulbs that burn out. Turn on lights if it is dark and use night-lights. Keep items that you use often in easy-to-reach places. Lower the shelves around your home if needed. Move furniture so that there are clear paths  around it. Do not keep throw rugs or other things on the floor that can make you trip. If any of your floors are uneven, fix them. Add color or contrast paint or tape to clearly mark and help you see: Grab bars or handrails. First and last steps of staircases. Where the edge of each step is. If you use a ladder or stepladder: Make sure that it is fully opened. Do not climb a closed ladder. Make sure the sides of the ladder are locked in place. Have someone hold the ladder while you use it. Know where your pets are as you move through your home. What can I do in the bathroom?     Keep the floor dry. Clean up any water that is on the floor right away. Remove soap buildup in the bathtub or shower. Buildup makes bathtubs and showers slippery. Use non-skid mats or decals on the floor of the bathtub or shower. Attach bath mats securely with double-sided, non-slip rug tape. If you need to sit down while you are in the shower, use a non-slip stool. Install grab bars by the toilet and in the bathtub and shower. Do not use towel bars as grab bars. What can I do in the bedroom? Make sure that you have a light by your bed that is easy to reach. Do not use any sheets or blankets on your bed that hang to the floor. Have a firm bench or chair with side arms that  you can use for support when you get dressed. What can I do in the kitchen? Clean up any spills right away. If you need to reach something above you, use a sturdy step stool that has a grab bar. Keep electrical cables out of the way. Do not use floor polish or wax that makes floors slippery. What can I do with my stairs? Do not leave anything on the stairs. Make sure that you have a light switch at the top and the bottom of the stairs. Have them installed if you do not have them. Make sure that there are handrails on both sides of the stairs. Fix handrails that are broken or loose. Make sure that handrails are as long as the  staircases. Install non-slip stair treads on all stairs in your home if they do not have carpet. Avoid having throw rugs at the top or bottom of stairs, or secure the rugs with carpet tape to prevent them from moving. Choose a carpet design that does not hide the edge of steps on the stairs. Make sure that carpet is firmly attached to the stairs. Fix any carpet that is loose or worn. What can I do on the outside of my home? Use bright outdoor lighting. Repair the edges of walkways and driveways and fix any cracks. Clear paths of anything that can make you trip, such as tools or rocks. Add color or contrast paint or tape to clearly mark and help you see high doorway thresholds. Trim any bushes or trees on the main path into your home. Check that handrails are securely fastened and in good repair. Both sides of all steps should have handrails. Install guardrails along the edges of any raised decks or porches. Have leaves, snow, and ice cleared regularly. Use sand, salt, or ice melt on walkways during winter months if you live where there is ice and snow. In the garage, clean up any spills right away, including grease or oil spills. What other actions can I take? Review your medicines with your health care provider. Some medicines can make you confused or feel dizzy. This can increase your chance of falling. Wear closed-toe shoes that fit well and support your feet. Wear shoes that have rubber soles and low heels. Use a cane, walker, scooter, or crutches that help you move around if needed. Talk with your provider about other ways that you can decrease your risk of falls. This may include seeing a physical therapist to learn to do exercises to improve movement and strength. Where to find more information Centers for Disease Control and Prevention, STEADI: TonerPromos.no [employer] on Aging: BaseRingTones.pl National Institute on Aging: BaseRingTones.pl Contact a health care provider if: You are afraid of  falling at home. You feel weak, drowsy, or dizzy at home. You fall at home. Get help right away if you: Lose consciousness or have trouble moving after a fall. Have a fall that causes a head injury. These symptoms may be an emergency. Get help right away. Call 911. Do not wait to see if the symptoms will go away. Do not drive yourself to the hospital. This information is not intended to replace advice given to you by your health care provider. Make sure you discuss any questions you have with your health care provider. Document Revised: 12/14/2021 Document Reviewed: 12/14/2021 Elsevier Patient Education  2024 [employer].

## 2023-01-04 NOTE — Progress Notes (Signed)
Subjective:   Jaime Freeman is a 67 y.o. female who presents for Medicare Annual (Subsequent) preventive examination.  Visit Complete: Virtual  I connected with  [name] on 01/04/23 by a audio enabled telemedicine application and verified that I am speaking with the correct person using two identifiers.  Patient Location: Home  Provider Location: Office/Clinic  I discussed the limitations of evaluation and management by telemedicine. The patient expressed understanding and agreed to proceed.  Vital Signs: Because this visit was a virtual/telehealth visit, some criteria may be missing or patient reported. Any vitals not documented were not able to be obtained and vitals that have been documented are patient reported.    Review of Systems     Cardiac Risk Factors include: advanced age (>59men, >61 women);dyslipidemia;hypertension;obesity (BMI >30kg/m2)     Objective:    Today's Vitals   01/04/23 1500  Weight: 192 lb (87.1 kg)  Height: 5\' 2"  (1.575 m)  PainSc: 5    Body mass index is 35.12 kg/m.     01/04/2023    3:16 PM 11/06/2022    2:15 PM 12/07/2017    7:01 PM 11/10/2017    8:39 PM 11/10/2017    2:23 PM 08/13/2015   10:22 AM  Advanced Directives  Does Patient Have a Medical Advance Directive? No No No Yes No No  Type of Advance Directive    Healthcare Power of Attorney    Does patient want to make changes to medical advance directive?    No - Patient declined    Copy of Healthcare Power of Attorney in Chart?    No - copy requested    Would patient like information on creating a medical advance directive? Yes (MAU/Ambulatory/Procedural Areas - Information given)    No - Patient declined No - patient declined information    Current Medications (verified) Outpatient Encounter Medications as of 01/04/2023  Medication Sig   albuterol (PROVENTIL) (2.5 MG/3ML) 0.083% nebulizer solution Take 3 mLs (2.5 mg total) by nebulization every 6 (six) hours as needed for wheezing  or shortness of breath.   albuterol (VENTOLIN HFA) 108 (90 Base) MCG/ACT inhaler Inhale 2 puffs into the lungs every 4 (four) hours as needed for wheezing or shortness of breath.   aspirin EC 81 MG tablet Take 81 mg by mouth daily.   benazepril (LOTENSIN) 20 MG tablet TAKE ONE TABLET BY MOUTH ONE TIME DAILY   bisacodyl (DULCOLAX) 5 MG EC tablet Take 5 mg by mouth daily as needed for moderate constipation.   clonazePAM (KLONOPIN) 0.5 MG tablet Take 1 tablet (0.5 mg total) by mouth 2 (two) times daily as needed for anxiety.   etodolac (LODINE) 400 MG tablet Take 1 tablet (400 mg total) by mouth 2 (two) times daily as needed.   fluticasone (FLONASE) 50 MCG/ACT nasal spray Place 2 sprays into both nostrils daily.   Multiple Vitamin (MULTI VITAMIN PO) Take by mouth.   nystatin ointment (MYCOSTATIN) Apply 1 Application topically 2 (two) times daily.   omeprazole (PRILOSEC) 20 MG capsule Take 1 capsule (20 mg total) by mouth daily.   QUEtiapine (SEROQUEL) 25 MG tablet TAKE ONE TO TWO TABLETS BY MOUTH DAILY AT BEDTIME. MAY TAKE ADDITIONAL ONE-HALF TABLET UP TO THREE TIMES DAILY AS NEEDED FOR ANXIETY   senna (SENOKOT) 8.6 MG tablet Take 1 tablet by mouth daily. Take 2 tablets at night   diclofenac Sodium (VOLTAREN) 1 % GEL Apply 4 g topically 4 (four) times daily. (Patient not taking: Reported  on 01/04/2023)   mupirocin ointment (BACTROBAN) 2 % Apply 1 Application topically 2 (two) times daily. (Patient not taking: Reported on 01/04/2023)   Facility-Administered Encounter Medications as of 01/04/2023  Medication   triamcinolone acetonide (KENALOG-40) injection 40 mg    Allergies (verified) Codeine, Dynacirc [isradipine], Guaifenesin & derivatives, Influenza vaccines, Other, Sudafed [pseudoephedrine], Zithromax [azithromycin], and Prednisone   History: Past Medical History:  Diagnosis Date   Allergic rhinitis    Angio-edema    Anxiety    Bipolar affective disorder (HCC)    Depression    Hiatal  hernia    Hypertension    IFG (impaired fasting glucose)    Lithium toxicity 11/10/2017   Menopausal state    Morbid obesity (HCC)    OCD (obsessive compulsive disorder)    Panic disorder    Restrictive lung disease    Urticaria    Vitamin B12 deficiency    Vitamin D deficiency disease    Past Surgical History:  Procedure Laterality Date   CESAREAN SECTION     DILATION AND CURETTAGE OF UTERUS     TONSILLECTOMY     Family History  Problem Relation Age of Onset   Heart disease Mother    Heart attack Mother    Hypertension Mother    Cancer Father        GI tract   Hypertension Father    Cancer Maternal Grandmother        gallbladder   Heart disease Maternal Grandfather    COPD Neg Hx    Diabetes Neg Hx    Stroke Neg Hx    Social History   Socioeconomic History   Marital status: Widowed    Spouse name: Not on file   Number of children: 2   Years of education: Not on file   Highest education level: Not on file  Occupational History   Occupation: retired  Tobacco Use   Smoking status: Former    Current packs/day: 0.00    Average packs/day: 0.5 packs/day for 17.0 years (8.5 ttl pk-yrs)    Types: Cigarettes    Start date: 3    Quit date: 04/27/1987    Years since quitting: 35.7   Smokeless tobacco: Never  Vaping Use   Vaping status: Never Used  Substance and Sexual Activity   Alcohol use: No   Drug use: No   Sexual activity: Not Currently  Other Topics Concern   Not on file  Social History Narrative   Not on file   Social Determinants of Health   Financial Resource Strain: Low Risk  (01/04/2023)   Overall Financial Resource Strain (CARDIA)    Difficulty of Paying Living Expenses: Not hard at all  Food Insecurity: No Food Insecurity (01/04/2023)   Hunger Vital Sign    Worried About Running Out of Food in the Last Year: Never true    Ran Out of Food in the Last Year: Never true  Transportation Needs: No Transportation Needs (01/04/2023)   PRAPARE -  Administrator, Civil Service (Medical): No    Lack of Transportation (Non-Medical): No  Physical Activity: Inactive (01/04/2023)   Exercise Vital Sign    Days of Exercise per Week: 0 days    Minutes of Exercise per Session: 0 min  Stress: No Stress Concern Present (01/04/2023)   [employer] of Occupational Health - Occupational Stress Questionnaire    Feeling of Stress : Not at all  Social Connections: Moderately Isolated (01/04/2023)   Social Connection and  Isolation Panel [NHANES]    Frequency of Communication with Friends and Family: More than three times a week    Frequency of Social Gatherings with Friends and Family: More than three times a week    Attends Religious Services: More than 4 times per year    Active Member of [employer] or Organizations: No    Attends Banker Meetings: Never    Marital Status: Widowed    Tobacco Counseling Counseling given: Not Answered   Clinical Intake:  Pre-visit preparation completed: Yes  Pain : 0-10 Pain Score: 5  Pain Type: Chronic pain Pain Location: Knee Pain Descriptors / Indicators: Aching     BMI - recorded: 35.12 Nutritional Status: BMI > 30  Obese Nutritional Risks: None Diabetes: No  How often do you need to have someone help you when you read instructions, pamphlets, or other written materials from your doctor or pharmacy?: 1 - Never  Interpreter Needed?: No  Information entered by :: Tora Kindred, CMA   Activities of Daily Living    01/04/2023    3:03 PM  In your present state of health, do you have any difficulty performing the following activities:  Hearing? 0  Vision? 0  Difficulty concentrating or making decisions? 0  Walking or climbing stairs? 1  Comment knee pain  Dressing or bathing? 0  Doing errands, shopping? 0  Preparing Food and eating ? N  Using the Toilet? N  In the past six months, have you accidently leaked urine? N  Do you have problems with loss of bowel control?  N  Managing your Medications? N  Managing your Finances? N  Housekeeping or managing your Housekeeping? N    Patient Care Team: Dorcas Carrow, DO as PCP - General (Family Medicine)  Indicate any recent Medical Services you may have received from other than [facility] providers in the past year (date may be approximate).     Assessment:   This is a routine wellness examination for Ziare.  Hearing/Vision screen Hearing Screening - Comments:: Denies hearing loss Vision Screening - Comments:: Gets routine eye exam   Goals Addressed               This Visit's Progress     Patient Stated (pt-stated)        Continue to lose weight. 160lb goal      Depression Screen    01/04/2023    3:13 PM 12/02/2022    1:48 PM 11/26/2022    5:03 PM 11/26/2022    3:58 PM 10/14/2022    2:01 PM 09/30/2022    3:45 PM 08/23/2022    2:21 PM  PHQ 2/9 Scores  PHQ - 2 Score 0  3  4 2 1   PHQ- 9 Score 0  7  11 2 2   Exception Documentation  Patient refusal  Patient refusal       Fall Risk    01/04/2023    3:19 PM 11/26/2022    5:03 PM 09/30/2022    3:44 PM 03/11/2022    2:50 PM 02/19/2022    9:14 AM  Fall Risk   Falls in the past year? 1 0 1 0 0  Number falls in past yr: 0 0 0 0 0  Injury with Fall? 0 0 0 0 0  Risk for fall due to : History of fall(s) No Fall Risks No Fall Risks No Fall Risks No Fall Risks  Follow up Falls prevention discussed;Falls evaluation completed Falls evaluation completed Falls evaluation  completed Falls evaluation completed Falls evaluation completed    MEDICARE RISK AT HOME: Medicare Risk at Home Any stairs in or around the home?: Yes If so, are there any without handrails?: Yes Home free of loose throw rugs in walkways, pet beds, electrical cords, etc?: Yes Adequate lighting in your home to reduce risk of falls?: Yes Life alert?: No Use of a cane, walker or w/c?: Yes (uses cane when needed) Grab bars in the bathroom?: No Shower chair or bench in shower?: No Elevated  toilet seat or a handicapped toilet?: No  TIMED UP AND GO:  Was the test performed?  No    Cognitive Function:        01/04/2023    3:21 PM 12/08/2021    3:31 PM  6CIT Screen  What Year? 0 points 0 points  What month? 0 points 0 points  What time? 0 points 0 points  Count back from 20 0 points 0 points  Months in reverse 0 points 2 points  Repeat phrase 0 points 4 points  Total Score 0 points 6 points    Immunizations Immunization History  Administered Date(s) Administered   PNEUMOCOCCAL CONJUGATE-20 12/08/2021   Tdap 06/30/2020    TDAP status: Up to date  Flu Vaccine status: Declined, Education has been provided regarding the importance of this vaccine but patient still declined. Advised may receive this vaccine at local pharmacy or Health Dept. Aware to provide a copy of the vaccination record if obtained from local pharmacy or Health Dept. Verbalized acceptance and understanding. Patient allergic  Pneumococcal vaccine status: Up to date  Covid-19 vaccine status: Declined, Education has been provided regarding the importance of this vaccine but patient still declined. Advised may receive this vaccine at local pharmacy or Health Dept.or vaccine clinic. Aware to provide a copy of the vaccination record if obtained from local pharmacy or Health Dept. Verbalized acceptance and understanding. Allergic per patient.  Qualifies for Shingles Vaccine? Yes   Zostavax completed No   Shingrix Completed?: No.    Education has been provided regarding the importance of this vaccine. Patient has been advised to call insurance company to determine out of pocket expense if they have not yet received this vaccine. Advised may also receive vaccine at local pharmacy or Health Dept. Verbalized acceptance and understanding.  Screening Tests Health Maintenance  Topic Date Due   MAMMOGRAM  Never done   DEXA SCAN  Never done   COVID-19 Vaccine (1 - 2023-24 season) Never done   Zoster Vaccines-  Shingrix (1 of 2) 02/05/2023 (Originally 11/10/2006)   INFLUENZA VACCINE  07/25/2023 (Originally 11/25/2022)   Medicare Annual Wellness (AWV)  01/04/2024   Fecal DNA (Cologuard)  12/14/2024   DTaP/Tdap/Td (2 - Td or Tdap) 07/01/2030   Pneumonia Vaccine 17+ Years old  Completed   Hepatitis C Screening  Completed   HPV VACCINES  Aged Out    Health Maintenance  Health Maintenance Due  Topic Date Due   MAMMOGRAM  Never done   DEXA SCAN  Never done   COVID-19 Vaccine (1 - 2023-24 season) Never done    Colorectal cancer screening: Type of screening: Cologuard. Completed 12/14/21. Repeat every 3 years  Mammogram status: Ordered 01/04/23. Pt provided with contact info and advised to call to schedule appt.   Bone Density status: Ordered 01/04/23. Pt provided with contact info and advised to call to schedule appt.  Lung Cancer Screening: (Low Dose CT Chest recommended if Age 24-80 years, 20 pack-year currently smoking  OR have quit w/in 15years.) does not qualify.   Lung Cancer Screening Referral: n/a  Additional Screening:  Hepatitis C Screening: does not qualify; Completed 02/08/19  Vision Screening: Recommended annual ophthalmology exams for early detection of glaucoma and other disorders of the eye.  Dental Screening: Recommended annual dental exams for proper oral hygiene    Community Resource Referral / Chronic Care Management: CRR required this visit?  No   CCM required this visit?  No     Plan:     I have personally reviewed and noted the following in the patient's chart:   Medical and social history Use of alcohol, tobacco or illicit drugs  Current medications and supplements including opioid prescriptions. Patient is not currently taking opioid prescriptions. Functional ability and status Nutritional status Physical activity Advanced directives List of other physicians Hospitalizations, surgeries, and ER visits in previous 12 months Vitals Screenings to include  cognitive, depression, and falls Referrals and appointments  In addition, I have reviewed and discussed with patient certain preventive protocols, quality metrics, and best practice recommendations. A written personalized care plan for preventive services as well as general preventive health recommendations were provided to patient.     Tora Kindred, CMA   01/04/2023   After Visit Summary: (Mail) Due to this being a telephonic visit, the after visit summary with patients personalized plan was offered to patient via mail   Nurse Notes:  Orders placed for MMG and DEXA scan Declined shingles vaccine Allergic to flu vaccine. Allergic to covid vaccine per patient.

## 2023-01-07 DIAGNOSIS — M1712 Unilateral primary osteoarthritis, left knee: Secondary | ICD-10-CM | POA: Diagnosis not present

## 2023-01-07 DIAGNOSIS — M170 Bilateral primary osteoarthritis of knee: Secondary | ICD-10-CM | POA: Diagnosis not present

## 2023-01-07 DIAGNOSIS — M1711 Unilateral primary osteoarthritis, right knee: Secondary | ICD-10-CM | POA: Diagnosis not present

## 2023-01-10 ENCOUNTER — Ambulatory Visit: Payer: Medicare HMO | Admitting: Family Medicine

## 2023-01-14 DIAGNOSIS — M170 Bilateral primary osteoarthritis of knee: Secondary | ICD-10-CM | POA: Diagnosis not present

## 2023-01-14 DIAGNOSIS — M1712 Unilateral primary osteoarthritis, left knee: Secondary | ICD-10-CM | POA: Diagnosis not present

## 2023-01-14 DIAGNOSIS — M1711 Unilateral primary osteoarthritis, right knee: Secondary | ICD-10-CM | POA: Diagnosis not present

## 2023-01-18 DIAGNOSIS — J029 Acute pharyngitis, unspecified: Secondary | ICD-10-CM | POA: Diagnosis not present

## 2023-01-18 DIAGNOSIS — Z03818 Encounter for observation for suspected exposure to other biological agents ruled out: Secondary | ICD-10-CM | POA: Diagnosis not present

## 2023-02-04 ENCOUNTER — Ambulatory Visit: Payer: Medicare HMO | Admitting: Family Medicine

## 2023-02-15 ENCOUNTER — Ambulatory Visit (INDEPENDENT_AMBULATORY_CARE_PROVIDER_SITE_OTHER): Payer: Medicare HMO | Admitting: Family Medicine

## 2023-02-15 ENCOUNTER — Ambulatory Visit: Payer: Self-pay | Admitting: *Deleted

## 2023-02-15 VITALS — BP 146/81 | HR 85 | Ht 61.42 in | Wt 192.2 lb

## 2023-02-15 DIAGNOSIS — R8281 Pyuria: Secondary | ICD-10-CM | POA: Diagnosis not present

## 2023-02-15 DIAGNOSIS — R0981 Nasal congestion: Secondary | ICD-10-CM

## 2023-02-15 DIAGNOSIS — N3000 Acute cystitis without hematuria: Secondary | ICD-10-CM | POA: Diagnosis not present

## 2023-02-15 DIAGNOSIS — R300 Dysuria: Secondary | ICD-10-CM | POA: Diagnosis not present

## 2023-02-15 MED ORDER — NITROFURANTOIN MONOHYD MACRO 100 MG PO CAPS: 100.0000 mg | ORAL_CAPSULE | Freq: Two times a day (BID) | ORAL | 0 refills | Status: AC

## 2023-02-15 MED ORDER — RYALTRIS 665-25 MCG/ACT NA SUSP: 2.0000 | Freq: Every day | NASAL | 0 refills | Status: DC | PRN

## 2023-02-15 NOTE — Assessment & Plan Note (Signed)
Acute, stable. UA + for leuks and bacteria, Wet Prep -, awaiting urine culture. Will treat with Macrobid 100 MG BID for 5 days. Recommend daily water hydration to prevent dehydration. Return if symptoms worsen or fail to improve.

## 2023-02-15 NOTE — Telephone Encounter (Signed)
Summary: poss UTI   Pt states after being constipated several days, she had a "blow out" stool 2 days ago..  She said it went everywhere, and she thinks even into her vagina too.  Last night she started having UTI sx of burning, pain, pressure. Would like to know if the dr can call her in a Rx for abx.         Reason for Disposition  All other patients with painful urination  (Exception: [1] EITHER frequency or urgency AND [2] has on-call doctor.)  Answer Assessment - Initial Assessment Questions 1. SEVERITY: "How bad is the pain?"  (e.g., Scale 1-10; mild, moderate, or severe)   - MILD (1-3): complains slightly about urination hurting   - MODERATE (4-7): interferes with normal activities     - SEVERE (8-10): excruciating, unwilling or unable to urinate because of the pain      Burning with urination- moderate 2. FREQUENCY: "How many times have you had painful urination today?"      3 times- patient has some incontence issues too 3. PATTERN: "Is pain present every time you urinate or just sometimes?"      Yes- every time 4. ONSET: "When did the painful urination start?"      Started yesterday 5. FEVER: "Do you have a fever?" If Yes, ask: "What is your temperature, how was it measured, and when did it start?"     no 6. PAST UTI: "Have you had a urine infection before?" If Yes, ask: "When was the last time?" and "What happened that time?"      Years ago 7. CAUSE: "What do you think is causing the painful urination?"  (e.g., UTI, scratch, Herpes sore)     Possible urination 8. OTHER SYMPTOMS: "Do you have any other symptoms?" (e.g., blood in urine, flank pain, genital sores, urgency, vaginal discharge)     urgency  Protocols used: Urination Pain - Female-A-AH

## 2023-02-15 NOTE — Progress Notes (Signed)
BP (!) 146/81   Pulse 85   Ht 5' 1.42" (1.56 m)   Wt 192 lb 3.2 oz (87.2 kg)   SpO2 98%   BMI 35.82 kg/m    Subjective:    Patient ID: Jaime Freeman, female    DOB: October 25, 1955, 67 y.o.   MRN: [id number]  HPI: Jaime Freeman is a 67 y.o. female  Chief Complaint  Patient presents with   Dysuria   She also complains of nasal congestion that started Thursday with sore throat that has resolved, runny nose with yellow mucus and nasal congestion remains. She feels her symptoms are getting worse. She is taking Flonase and Claritin daily. She denies ear pain, sinus tenderness, cough, chest congestion, sob, cp.    URINARY SYMPTOMS Yesterday symptoms started after using x2 enemas for treating constipation, may have accidentally wiped forward after bowel movements.  Dysuria: yes Urinary frequency: yes Urgency: yes Small volume voids: no Symptom severity: yes Urinary incontinence: no Foul odor: no Hematuria: no Abdominal pain: no Back pain: no Suprapubic pain/pressure: no Flank pain: no Fever:  no Vomiting: no Relief with cranberry juice:  Not taking Relief with pyridium: no Status: worse Previous urinary tract infection: No Recurrent urinary tract infection: no History of sexually transmitted disease: no Treatments attempted: none    Relevant past medical, surgical, family and social history reviewed and updated as indicated. Interim medical history since our last visit reviewed.  Allergies and medications reviewed and updated.  Review of Systems  Constitutional:  Negative for chills and fever.  HENT:  Positive for postnasal drip and rhinorrhea. Negative for congestion, ear discharge, ear pain, sinus pressure, sinus pain, sneezing and sore throat.   Respiratory:  Negative for cough, chest tightness and shortness of breath.   Cardiovascular: Negative.   Gastrointestinal:  Negative for vomiting.  Genitourinary:  Positive for dysuria, frequency and urgency. Negative for  decreased urine volume, difficulty urinating, dyspareunia, flank pain, hematuria and pelvic pain.  Musculoskeletal:  Negative for back pain.  Skin:  Positive for rash.    Per HPI unless specifically indicated above     Objective:    BP (!) 146/81   Pulse 85   Ht 5' 1.42" (1.56 m)   Wt 192 lb 3.2 oz (87.2 kg)   SpO2 98%   BMI 35.82 kg/m   Wt Readings from Last 3 Encounters:  02/15/23 192 lb 3.2 oz (87.2 kg)  01/04/23 192 lb (87.1 kg)  12/21/22 192 lb 12.8 oz (87.5 kg)    Physical Exam Vitals and nursing note reviewed.  Constitutional:      General: She is awake. She is not in acute distress.    Appearance: Normal appearance. She is well-developed and well-groomed. She is obese. She is not ill-appearing.  HENT:     Head: Normocephalic and atraumatic.     Right Ear: Hearing and external ear normal. No drainage. Tympanic membrane is not erythematous.     Left Ear: Hearing and external ear normal. No drainage. Tympanic membrane is not erythematous.     Nose: Congestion and rhinorrhea present.     Right Turbinates: Pale. Not swollen.     Left Turbinates: Pale. Not swollen.     Right Sinus: No maxillary sinus tenderness or frontal sinus tenderness.     Left Sinus: No maxillary sinus tenderness or frontal sinus tenderness.     Mouth/Throat:     Pharynx: Oropharynx is clear. No posterior oropharyngeal erythema or postnasal drip.  Tonsils: No tonsillar exudate.  Eyes:     General: Lids are normal.        Right eye: No discharge.        Left eye: No discharge.     Conjunctiva/sclera: Conjunctivae normal.  Cardiovascular:     Rate and Rhythm: Normal rate and regular rhythm.     Pulses:          Radial pulses are 2+ on the right side and 2+ on the left side.       Posterior tibial pulses are 2+ on the right side and 2+ on the left side.     Heart sounds: Normal heart sounds, S1 normal and S2 normal. No murmur heard.    No gallop.  Pulmonary:     Effort: Pulmonary effort is  normal. No accessory muscle usage or respiratory distress.     Breath sounds: Normal breath sounds. No wheezing, rhonchi or rales.  Abdominal:     Tenderness: There is no abdominal tenderness. There is no right CVA tenderness or left CVA tenderness.  Musculoskeletal:        General: Normal range of motion.     Cervical back: Full passive range of motion without pain and normal range of motion.     Right lower leg: No edema.     Left lower leg: No edema.  Lymphadenopathy:     Cervical: No cervical adenopathy.  Skin:    General: Skin is warm and dry.     Capillary Refill: Capillary refill takes less than 2 seconds.  Neurological:     Mental Status: She is alert and oriented to person, place, and time.  Psychiatric:        Attention and Perception: Attention normal.        Mood and Affect: Mood normal.        Speech: Speech normal.        Behavior: Behavior normal. Behavior is cooperative.        Thought Content: Thought content normal.     Results for orders placed or performed in visit on 12/21/22  Novel Coronavirus, NAA ([facility])   Specimen: Nasopharyngeal(NP) swabs in vial transport medium  Result Value Ref Range   SARS-CoV-2, NAA Not Detected Not Detected  Rapid Strep screen([facility])   Specimen: Other   Other  Result Value Ref Range   Strep Gp A Ag, IA W/Reflex Negative Negative  Culture, Group A Strep   Other  Result Value Ref Range   Strep A Culture Negative       Assessment & Plan:   Problem List Items Addressed This Visit     Pyuria - Primary    Acute, stable. UA + for leuks and bacteria, Wet Prep -, awaiting urine culture. Will treat with Macrobid 100 MG BID for 5 days. Recommend daily water hydration to prevent dehydration. Return if symptoms worsen or fail to improve.        Relevant Orders   Urinalysis, Routine w reflex microscopic   Urine Culture   WET PREP FOR TRICH, YEAST, CLUE   Other Visit Diagnoses     Nasal congestion       Acute,  stable. Will send Ryaltris to switch from Flonase. Recommend continue use of Claritin daily. Call if no improvement.        Follow up plan: Return if symptoms worsen or fail to improve.

## 2023-02-15 NOTE — Telephone Encounter (Signed)
  Chief Complaint: UTI symptoms Symptoms: painful urination, urgency Frequency: started yesterday Pertinent Negatives: Patient denies fever Disposition: [] ED /[] Urgent Care (no appt availability in office) / [x] Appointment(In office/virtual)/ []  [facility]/ [] Home Care/ [] Refused Recommended Disposition /[] [facility]/ []  Follow-up with PCP Additional Notes: Patient has been scheduled for this afternoon.

## 2023-02-15 NOTE — Patient Instructions (Addendum)
Try Mucinex over the counter Try Humidifier use or hot steamy shower to thin mucus Try new nasal spray Ryaltris

## 2023-02-16 LAB — WET PREP FOR TRICH, YEAST, CLUE
Clue Cell Exam: NEGATIVE
Trichomonas Exam: NEGATIVE
Yeast Exam: NEGATIVE

## 2023-02-16 LAB — MICROSCOPIC EXAMINATION: WBC, UA: >30 /HPF — ABNORMAL HIGH (ref 0–5)

## 2023-02-16 LAB — URINALYSIS, ROUTINE W REFLEX MICROSCOPIC
Bilirubin, UA: NEGATIVE
Glucose, UA: NEGATIVE
Ketones, UA: NEGATIVE
Nitrite, UA: NEGATIVE
Specific Gravity, UA: >1.03 — ABNORMAL HIGH (ref 1.005–1.030)
Urobilinogen, Ur: 0.2 mg/dL (ref 0.2–1.0)
pH, UA: 5.5 (ref 5.0–7.5)

## 2023-02-18 LAB — URINE CULTURE

## 2023-02-18 NOTE — Addendum Note (Signed)
Addended by: Prescott Gum on: 02/18/2023 08:55 AM   Modules accepted: Orders

## 2023-02-18 NOTE — Progress Notes (Signed)
Hello, can you inform Jaime Freeman that her urine culture returned indicating she has a urinary tract infection. We would like for her to come in for a lab visit and leave a urine sample next week to ensure the antibiotic she is taking has cleared the bacteria. Thank you!

## 2023-02-21 NOTE — Progress Notes (Signed)
Patient seen in office on 02-15-23

## 2023-02-22 ENCOUNTER — Ambulatory Visit (INDEPENDENT_AMBULATORY_CARE_PROVIDER_SITE_OTHER): Payer: Medicare HMO | Admitting: Family Medicine

## 2023-02-22 ENCOUNTER — Encounter: Payer: Self-pay | Admitting: Family Medicine

## 2023-02-22 ENCOUNTER — Other Ambulatory Visit: Payer: Self-pay | Admitting: Family Medicine

## 2023-02-22 VITALS — BP 121/77 | HR 78 | Ht 61.42 in | Wt 192.8 lb

## 2023-02-22 DIAGNOSIS — I129 Hypertensive chronic kidney disease with stage 1 through stage 4 chronic kidney disease, or unspecified chronic kidney disease: Secondary | ICD-10-CM

## 2023-02-22 DIAGNOSIS — F332 Major depressive disorder, recurrent severe without psychotic features: Secondary | ICD-10-CM

## 2023-02-22 DIAGNOSIS — N898 Other specified noninflammatory disorders of vagina: Secondary | ICD-10-CM | POA: Diagnosis not present

## 2023-02-22 DIAGNOSIS — N181 Chronic kidney disease, stage 1: Secondary | ICD-10-CM

## 2023-02-22 DIAGNOSIS — E538 Deficiency of other specified B group vitamins: Secondary | ICD-10-CM

## 2023-02-22 DIAGNOSIS — E785 Hyperlipidemia, unspecified: Secondary | ICD-10-CM | POA: Diagnosis not present

## 2023-02-22 DIAGNOSIS — E559 Vitamin D deficiency, unspecified: Secondary | ICD-10-CM | POA: Diagnosis not present

## 2023-02-22 DIAGNOSIS — R7301 Impaired fasting glucose: Secondary | ICD-10-CM | POA: Diagnosis not present

## 2023-02-22 DIAGNOSIS — Z0000 Encounter for general adult medical examination without abnormal findings: Secondary | ICD-10-CM | POA: Diagnosis not present

## 2023-02-22 DIAGNOSIS — D692 Other nonthrombocytopenic purpura: Secondary | ICD-10-CM

## 2023-02-22 DIAGNOSIS — F419 Anxiety disorder, unspecified: Secondary | ICD-10-CM

## 2023-02-22 LAB — URINALYSIS, ROUTINE W REFLEX MICROSCOPIC
Bilirubin, UA: NEGATIVE
Glucose, UA: NEGATIVE
Ketones, UA: NEGATIVE
Leukocytes,UA: NEGATIVE
Nitrite, UA: NEGATIVE
Protein,UA: NEGATIVE
RBC, UA: NEGATIVE
Specific Gravity, UA: 1.025 (ref 1.005–1.030)
Urobilinogen, Ur: 0.2 mg/dL (ref 0.2–1.0)
pH, UA: 6 (ref 5.0–7.5)

## 2023-02-22 LAB — BAYER DCA HB A1C WAIVED: HB A1C (BAYER DCA - WAIVED): 5.8 % — ABNORMAL HIGH (ref 4.8–5.6)

## 2023-02-22 LAB — MICROALBUMIN, URINE WAIVED
Creatinine, Urine Waived: 50 mg/dL (ref 10–300)
Microalb, Ur Waived: 30 mg/L — ABNORMAL HIGH (ref 0–19)

## 2023-02-22 LAB — WET PREP FOR TRICH, YEAST, CLUE
Clue Cell Exam: POSITIVE — AB
Trichomonas Exam: NEGATIVE
Yeast Exam: NEGATIVE

## 2023-02-22 MED ORDER — OMEPRAZOLE 20 MG PO CPDR: 20.0000 mg | DELAYED_RELEASE_CAPSULE | Freq: Every day | ORAL | 1 refills | Status: DC

## 2023-02-22 NOTE — Assessment & Plan Note (Signed)
Rechecking labs today. Await results. Treat as needed.  °

## 2023-02-22 NOTE — Assessment & Plan Note (Signed)
Doing better. Feeling more stable. Continue current regimen. Continue to monitor. Refills given.

## 2023-02-22 NOTE — Assessment & Plan Note (Signed)
Due to HTN and HLD. Continue to work on diet and exercise with goal of losing 1-2lbs per week. Call with any concerns.

## 2023-02-22 NOTE — Progress Notes (Signed)
BP 121/77   Pulse 78   Ht 5' 1.42" (1.56 m)   Wt 192 lb 12.8 oz (87.5 kg)   SpO2 99%   BMI 35.93 kg/m    Subjective:    Patient ID: Jaime Freeman, female    DOB: 1955-12-17, 67 y.o.   MRN: [id number]  HPI: Jaime Freeman is a 67 y.o. female presenting on 02/22/2023 for comprehensive medical examination. Current medical complaints include:  ANXIETY/DEPRESSION Duration: chronic Status:stable Anxious mood: yes  Excessive worrying: no Irritability: no  Sweating: no Nausea: no Palpitations:no Hyperventilation: no Panic attacks: yes Agoraphobia: no  Obscessions/compulsions: no Depressed mood: no    02/15/2023    3:43 PM 02/15/2023    3:42 PM 01/04/2023    3:13 PM 11/26/2022    5:03 PM 10/14/2022    2:01 PM  Depression screen PHQ 2/9  Decreased Interest 0 0 0 1 2  Down, Depressed, Hopeless 0  0 2 2  PHQ - 2 Score 0 0 0 3 4  Altered sleeping   0 1 1  Tired, decreased energy   0 1 2  Change in appetite   0 1 2  Feeling bad or failure about yourself    0 0 0  Trouble concentrating   0 1 2  Moving slowly or fidgety/restless   0 0 0  Suicidal thoughts   0 0 0  PHQ-9 Score   0 7 11  Difficult doing work/chores   Not difficult at all Not difficult at all Somewhat difficult   Anhedonia: no Weight changes: no Insomnia: no   Hypersomnia: no Fatigue/loss of energy: no Feelings of worthlessness: no Feelings of guilt: no Impaired concentration/indecisiveness: no Suicidal ideations: no  Crying spells: no Recent Stressors/Life Changes: no   Relationship problems: no   Family stress: no     Financial stress: no    Job stress: no    Recent death/loss: yes  HYPERTENSION / HYPERLIPIDEMIA Satisfied with current treatment? yes Duration of hypertension: chronic BP monitoring frequency: not checking BP medication side effects: no Past BP meds: benazepril Duration of hyperlipidemia: chronic Cholesterol medication side effects: no Cholesterol supplements: none Past  cholesterol medications: none Medication compliance: good compliance Aspirin: yes Recent stressors: yes Recurrent headaches: no Visual changes: no Palpitations: no Dyspnea: no Chest pain: no Lower extremity edema: no Dizzy/lightheaded: no   She currently lives with: son and daughter in law Menopausal Symptoms: no  Depression Screen done today and results listed below:     02/15/2023    3:43 PM 02/15/2023    3:42 PM 01/04/2023    3:13 PM 11/26/2022    5:03 PM 10/14/2022    2:01 PM  Depression screen PHQ 2/9  Decreased Interest 0 0 0 1 2  Down, Depressed, Hopeless 0  0 2 2  PHQ - 2 Score 0 0 0 3 4  Altered sleeping   0 1 1  Tired, decreased energy   0 1 2  Change in appetite   0 1 2  Feeling bad or failure about yourself    0 0 0  Trouble concentrating   0 1 2  Moving slowly or fidgety/restless   0 0 0  Suicidal thoughts   0 0 0  PHQ-9 Score   0 7 11  Difficult doing work/chores   Not difficult at all Not difficult at all Somewhat difficult    Past Medical History:  Past Medical History:  Diagnosis Date   Allergic rhinitis  Angio-edema    Anxiety    Bipolar affective disorder (HCC)    Depression    Hiatal hernia    Hypertension    IFG (impaired fasting glucose)    Lithium toxicity 11/10/2017   Menopausal state    Morbid obesity (HCC)    OCD (obsessive compulsive disorder)    Panic disorder    Restrictive lung disease    Urticaria    Vitamin B12 deficiency    Vitamin D deficiency disease     Surgical History:  Past Surgical History:  Procedure Laterality Date   CESAREAN SECTION     DILATION AND CURETTAGE OF UTERUS     TONSILLECTOMY      Medications:  Current Outpatient Medications on File Prior to Visit  Medication Sig   albuterol (PROVENTIL) (2.5 MG/3ML) 0.083% nebulizer solution Take 3 mLs (2.5 mg total) by nebulization every 6 (six) hours as needed for wheezing or shortness of breath.   albuterol (VENTOLIN HFA) 108 (90 Base) MCG/ACT inhaler  Inhale 2 puffs into the lungs every 4 (four) hours as needed for wheezing or shortness of breath.   aspirin EC 81 MG tablet Take 81 mg by mouth daily.   benazepril (LOTENSIN) 20 MG tablet TAKE ONE TABLET BY MOUTH ONE TIME DAILY   bisacodyl (DULCOLAX) 5 MG EC tablet Take 5 mg by mouth daily as needed for moderate constipation.   clonazePAM (KLONOPIN) 0.5 MG tablet Take 1 tablet (0.5 mg total) by mouth 2 (two) times daily as needed for anxiety.   diclofenac Sodium (VOLTAREN) 1 % GEL Apply 4 g topically 4 (four) times daily.   etodolac (LODINE) 400 MG tablet Take 1 tablet (400 mg total) by mouth 2 (two) times daily as needed.   fluticasone (FLONASE) 50 MCG/ACT nasal spray Place 2 sprays into both nostrils daily.   Multiple Vitamin (MULTI VITAMIN PO) Take by mouth.   mupirocin ointment (BACTROBAN) 2 % Apply 1 Application topically 2 (two) times daily.   nystatin ointment (MYCOSTATIN) Apply 1 Application topically 2 (two) times daily.   Olopatadine-Mometasone (RYALTRIS) [id number] MCG/ACT SUSP Place 2 sprays into the nose daily as needed.   QUEtiapine (SEROQUEL) 25 MG tablet TAKE ONE TO TWO TABLETS BY MOUTH DAILY AT BEDTIME. MAY TAKE ADDITIONAL ONE-HALF TABLET UP TO THREE TIMES DAILY AS NEEDED FOR ANXIETY   senna (SENOKOT) 8.6 MG tablet Take 1 tablet by mouth daily. Take 2 tablets at night   Current Facility-Administered Medications on File Prior to Visit  Medication   triamcinolone acetonide (KENALOG-40) injection 40 mg    Allergies:  Allergies  Allergen Reactions   Codeine Other (See Comments)   Dynacirc [Isradipine]    Guaifenesin & Derivatives    Influenza Vaccines    Other Other (See Comments)   Sudafed [Pseudoephedrine]    Zithromax [Azithromycin]    Prednisone Anxiety    Anxiousness    Social History:  Social History   Socioeconomic History   Marital status: Widowed    Spouse name: Not on file   Number of children: 2   Years of education: Not on file   Highest education level:  Not on file  Occupational History   Occupation: retired  Tobacco Use   Smoking status: Former    Current packs/day: 0.00    Average packs/day: 0.5 packs/day for 17.0 years (8.5 ttl pk-yrs)    Types: Cigarettes    Start date: 89    Quit date: 04/27/1987    Years since quitting: 35.8   Smokeless  tobacco: Never  Vaping Use   Vaping status: Never Used  Substance and Sexual Activity   Alcohol use: No   Drug use: No   Sexual activity: Not Currently  Other Topics Concern   Not on file  Social History Narrative   Not on file   Social Determinants of Health   Financial Resource Strain: Low Risk  (01/04/2023)   Overall Financial Resource Strain (CARDIA)    Difficulty of Paying Living Expenses: Not hard at all  Food Insecurity: No Food Insecurity (01/04/2023)   Hunger Vital Sign    Worried About Running Out of Food in the Last Year: Never true    Ran Out of Food in the Last Year: Never true  Transportation Needs: No Transportation Needs (01/04/2023)   PRAPARE - Administrator, Civil Service (Medical): No    Lack of Transportation (Non-Medical): No  Physical Activity: Inactive (01/04/2023)   Exercise Vital Sign    Days of Exercise per Week: 0 days    Minutes of Exercise per Session: 0 min  Stress: No Stress Concern Present (01/04/2023)   [employer] of Occupational Health - Occupational Stress Questionnaire    Feeling of Stress : Not at all  Social Connections: Moderately Isolated (01/04/2023)   Social Connection and Isolation Panel [NHANES]    Frequency of Communication with Friends and Family: More than three times a week    Frequency of Social Gatherings with Friends and Family: More than three times a week    Attends Religious Services: More than 4 times per year    Active Member of [employer] or Organizations: No    Attends Banker Meetings: Never    Marital Status: Widowed  Intimate Partner Violence: Not At Risk (01/04/2023)   Humiliation, Afraid, Rape,  and Kick questionnaire    Fear of Current or Ex-Partner: No    Emotionally Abused: No    Physically Abused: No    Sexually Abused: No   Social History   Tobacco Use  Smoking Status Former   Current packs/day: 0.00   Average packs/day: 0.5 packs/day for 17.0 years (8.5 ttl pk-yrs)   Types: Cigarettes   Start date: 74   Quit date: 04/27/1987   Years since quitting: 35.8  Smokeless Tobacco Never   Social History   Substance and Sexual Activity  Alcohol Use No    Family History:  Family History  Problem Relation Age of Onset   Heart disease Mother    Heart attack Mother    Hypertension Mother    Cancer Father        GI tract   Hypertension Father    Cancer Maternal Grandmother        gallbladder   Heart disease Maternal Grandfather    COPD Neg Hx    Diabetes Neg Hx    Stroke Neg Hx     Past medical history, surgical history, medications, allergies, family history and social history reviewed with patient today and changes made to appropriate areas of the chart.   Review of Systems  Constitutional: Negative.   HENT:  Positive for congestion and sore throat. Negative for ear discharge, ear pain, hearing loss, nosebleeds, sinus pain and tinnitus.   Eyes: Negative.   Respiratory:  Positive for cough. Negative for hemoptysis, sputum production, shortness of breath, wheezing and stridor.   Cardiovascular: Negative.   Gastrointestinal:  Positive for abdominal pain and constipation. Negative for blood in stool, diarrhea, heartburn, melena, nausea and vomiting.  Genitourinary:  Negative.   Musculoskeletal: Negative.   Skin: Negative.   Neurological:  Positive for dizziness. Negative for tingling, tremors, sensory change, speech change, focal weakness, seizures, loss of consciousness, weakness and headaches.  Endo/Heme/Allergies:  Positive for polydipsia. Negative for environmental allergies. Bruises/bleeds easily.  Psychiatric/Behavioral:  Negative for depression,  hallucinations, memory loss, substance abuse and suicidal ideas. The patient is nervous/anxious. The patient does not have insomnia.    All other ROS negative except what is listed above and in the HPI.      Objective:    BP 121/77   Pulse 78   Ht 5' 1.42" (1.56 m)   Wt 192 lb 12.8 oz (87.5 kg)   SpO2 99%   BMI 35.93 kg/m   Wt Readings from Last 3 Encounters:  02/22/23 192 lb 12.8 oz (87.5 kg)  02/15/23 192 lb 3.2 oz (87.2 kg)  01/04/23 192 lb (87.1 kg)    Physical Exam Vitals and nursing note reviewed.  Constitutional:      General: She is not in acute distress.    Appearance: Normal appearance. She is obese. She is not ill-appearing, toxic-appearing or diaphoretic.  HENT:     Head: Normocephalic and atraumatic.     Right Ear: Tympanic membrane, ear canal and external ear normal. There is no impacted cerumen.     Left Ear: Tympanic membrane, ear canal and external ear normal. There is no impacted cerumen.     Nose: Nose normal. No congestion or rhinorrhea.     Mouth/Throat:     Mouth: Mucous membranes are moist.     Pharynx: Oropharynx is clear. No oropharyngeal exudate or posterior oropharyngeal erythema.  Eyes:     General: No scleral icterus.       Right eye: No discharge.        Left eye: No discharge.     Extraocular Movements: Extraocular movements intact.     Conjunctiva/sclera: Conjunctivae normal.     Pupils: Pupils are equal, round, and reactive to light.  Neck:     Vascular: No carotid bruit.  Cardiovascular:     Rate and Rhythm: Normal rate and regular rhythm.     Pulses: Normal pulses.     Heart sounds: No murmur heard.    No friction rub. No gallop.  Pulmonary:     Effort: Pulmonary effort is normal. No respiratory distress.     Breath sounds: Normal breath sounds. No stridor. No wheezing, rhonchi or rales.  Chest:     Chest wall: No tenderness.  Abdominal:     General: Abdomen is flat. Bowel sounds are normal. There is no distension.      Palpations: Abdomen is soft. There is no mass.     Tenderness: There is no abdominal tenderness. There is no right CVA tenderness, left CVA tenderness, guarding or rebound.     Hernia: No hernia is present.  Genitourinary:    Comments: Breast and pelvic exams deferred with shared decision making Musculoskeletal:        General: No swelling, tenderness, deformity or signs of injury.     Cervical back: Normal range of motion and neck supple. No rigidity. No muscular tenderness.     Right lower leg: No edema.     Left lower leg: No edema.  Lymphadenopathy:     Cervical: No cervical adenopathy.  Skin:    General: Skin is warm and dry.     Capillary Refill: Capillary refill takes less than 2 seconds.  Coloration: Skin is not jaundiced or pale.     Findings: No bruising, erythema, lesion or rash.  Neurological:     General: No focal deficit present.     Mental Status: She is alert and oriented to person, place, and time. Mental status is at baseline.     Cranial Nerves: No cranial nerve deficit.     Sensory: No sensory deficit.     Motor: No weakness.     Coordination: Coordination normal.     Gait: Gait normal.     Deep Tendon Reflexes: Reflexes normal.  Psychiatric:        Mood and Affect: Mood normal.        Behavior: Behavior normal.        Thought Content: Thought content normal.        Judgment: Judgment normal.     Results for orders placed or performed in visit on 02/22/23  WET PREP FOR TRICH, YEAST, CLUE   Specimen: Urine   Urine  Result Value Ref Range   Trichomonas Exam Negative Negative   Yeast Exam Negative Negative   Clue Cell Exam Positive (A) Negative  Bayer DCA Hb A1c Waived  Result Value Ref Range   HB A1C (BAYER DCA - WAIVED) 5.8 (H) 4.8 - 5.6 %  Microalbumin, Urine Waived  Result Value Ref Range   Microalb, Ur Waived 30 (H) 0 - 19 mg/L   Creatinine, Urine Waived 50 10 - 300 mg/dL   Microalb/Creat Ratio 30-300 (H) <30 mg/g  Urinalysis, Routine w  reflex microscopic  Result Value Ref Range   Specific Gravity, UA 1.025 1.005 - 1.030   pH, UA 6.0 5.0 - 7.5   Color, UA Yellow Yellow   Appearance Ur Clear Clear   Leukocytes,UA Negative Negative   Protein,UA Negative Negative/Trace   Glucose, UA Negative Negative   Ketones, UA Negative Negative   RBC, UA Negative Negative   Bilirubin, UA Negative Negative   Urobilinogen, Ur 0.2 0.2 - 1.0 mg/dL   Nitrite, UA Negative Negative   Microscopic Examination Comment       Assessment & Plan:   Problem List Items Addressed This Visit       Cardiovascular and Mediastinum   Senile purpura (HCC)    Reassured patient. Continue to monitor.       Relevant Orders   CBC with Differential/Platelet   Comprehensive metabolic panel     Endocrine   IFG (impaired fasting glucose)    Rechecking labs today. Await results. Treat as needed.       Relevant Orders   Bayer DCA Hb A1c Waived (Completed)   CBC with Differential/Platelet   Comprehensive metabolic panel     Genitourinary   Benign hypertensive renal disease    Under good control on current regimen. Continue current regimen. Continue to monitor. Call with any concerns. Refills given. Labs drawn today.         Relevant Orders   Microalbumin, Urine Waived (Completed)   CBC with Differential/Platelet   Comprehensive metabolic panel   TSH   CKD (chronic kidney disease) stage 1, GFR 90 ml/min or greater    Rechecking labs today. Await results. Treat as needed.       Relevant Orders   CBC with Differential/Platelet   Comprehensive metabolic panel     Other   Morbid obesity (HCC)    Due to HTN and HLD. Continue to work on diet and exercise with goal of losing 1-2lbs per week. Call with  any concerns.       Vitamin D deficiency disease    Rechecking labs today. Await results. Treat as needed.       Relevant Orders   VITAMIN D 25 Hydroxy (Vit-D Deficiency, Fractures)   CBC with Differential/Platelet   Comprehensive  metabolic panel   Vitamin B12 deficiency    Rechecking labs today. Await results. Treat as needed.       Relevant Orders   B12   CBC with Differential/Platelet   Comprehensive metabolic panel   Depression    Doing better. Feeling more stable. Continue current regimen. Continue to monitor. Refills given.       Relevant Orders   CBC with Differential/Platelet   Comprehensive metabolic panel   Anxiety    Doing better. Feeling more stable. Continue current regimen. Continue to monitor. Refills given.       Relevant Orders   CBC with Differential/Platelet   Comprehensive metabolic panel   Dyslipidemia    Rechecking labs today. Await results. Treat as needed.       Relevant Orders   CBC with Differential/Platelet   Comprehensive metabolic panel   Lipid Panel w/o Chol/HDL Ratio   Other Visit Diagnoses     Routine general medical examination at a health care facility    -  Primary   Vaccines up to date/declined. Screening labs checked today. Pap N/A. Mammo and DEXA scheduled. Cologuard up to date. Continue diet and exercise. Call w concerns   Vaginal irritation       Will check swab and UA today. Await results.   Relevant Orders   WET PREP FOR TRICH, YEAST, CLUE (Completed)   Urinalysis, Routine w reflex microscopic (Completed)        Follow up plan: Return in about 6 months (around 08/23/2023).   LABORATORY TESTING:  - Pap smear: not applicable  IMMUNIZATIONS:   - Tdap: Tetanus vaccination status reviewed: last tetanus booster within 10 years. - Influenza: Refused - Pneumovax: Up to date - Prevnar: Up to date - COVID: Refused - HPV: Not applicable - Shingrix vaccine: Refused  SCREENING: -Mammogram: scheduled  - Colonoscopy: Up to date  - Bone Density: scheduled   PATIENT COUNSELING:   Advised to take 1 mg of folate supplement per day if capable of pregnancy.   Sexuality: Discussed sexually transmitted diseases, partner selection, use of condoms, avoidance  of unintended pregnancy  and contraceptive alternatives.   Advised to avoid cigarette smoking.  I discussed with the patient that most people either abstain from alcohol or drink within safe limits (<=14/week and <=4 drinks/occasion for males, <=7/weeks and <= 3 drinks/occasion for females) and that the risk for alcohol disorders and other health effects rises proportionally with the number of drinks per week and how often a drinker exceeds daily limits.  Discussed cessation/primary prevention of drug use and availability of treatment for abuse.   Diet: Encouraged to adjust caloric intake to maintain  or achieve ideal body weight, to reduce intake of dietary saturated fat and total fat, to limit sodium intake by avoiding high sodium foods and not adding table salt, and to maintain adequate dietary potassium and calcium preferably from fresh fruits, vegetables, and low-fat dairy products.    stressed the importance of regular exercise  Injury prevention: Discussed safety belts, safety helmets, smoke detector, smoking near bedding or upholstery.   Dental health: Discussed importance of regular tooth brushing, flossing, and dental visits.    NEXT PREVENTATIVE PHYSICAL DUE IN 1 YEAR. Return in  about 6 months (around 08/23/2023).

## 2023-02-22 NOTE — Assessment & Plan Note (Signed)
Under good control on current regimen. Continue current regimen. Continue to monitor. Call with any concerns. Refills given. Labs drawn today.   

## 2023-02-22 NOTE — Assessment & Plan Note (Signed)
Reassured patient. Continue to monitor.  

## 2023-02-23 ENCOUNTER — Other Ambulatory Visit: Payer: Self-pay | Admitting: Family Medicine

## 2023-02-23 DIAGNOSIS — F332 Major depressive disorder, recurrent severe without psychotic features: Secondary | ICD-10-CM

## 2023-02-23 LAB — COMPREHENSIVE METABOLIC PANEL
ALT: 12 [IU]/L (ref 0–32)
AST: 20 [IU]/L (ref 0–40)
Albumin: 4.4 g/dL (ref 3.9–4.9)
Alkaline Phosphatase: 73 [IU]/L (ref 44–121)
BUN/Creatinine Ratio: 32 — ABNORMAL HIGH (ref 12–28)
BUN: 20 mg/dL (ref 8–27)
Bilirubin Total: 0.4 mg/dL (ref 0.0–1.2)
CO2: 24 mmol/L (ref 20–29)
Calcium: 9.9 mg/dL (ref 8.7–10.3)
Chloride: 102 mmol/L (ref 96–106)
Creatinine, Ser: 0.62 mg/dL (ref 0.57–1.00)
Globulin, Total: 2.2 g/dL (ref 1.5–4.5)
Glucose: 86 mg/dL (ref 70–99)
Potassium: 5.1 mmol/L (ref 3.5–5.2)
Sodium: 140 mmol/L (ref 134–144)
Total Protein: 6.6 g/dL (ref 6.0–8.5)
eGFR: 98 mL/min/{1.73_m2} (ref >59)

## 2023-02-23 LAB — CBC WITH DIFFERENTIAL/PLATELET
Basophils Absolute: 0.1 10*3/uL (ref 0.0–0.2)
Basos: 1 %
EOS (ABSOLUTE): 0.1 10*3/uL (ref 0.0–0.4)
Eos: 2 %
Hematocrit: 40.4 % (ref 34.0–46.6)
Hemoglobin: 13.1 g/dL (ref 11.1–15.9)
Immature Grans (Abs): 0 10*3/uL (ref 0.0–0.1)
Immature Granulocytes: 0 %
Lymphocytes Absolute: 2 10*3/uL (ref 0.7–3.1)
Lymphs: 25 %
MCH: 33.5 pg — ABNORMAL HIGH (ref 26.6–33.0)
MCHC: 32.4 g/dL (ref 31.5–35.7)
MCV: 103 fL — ABNORMAL HIGH (ref 79–97)
Monocytes Absolute: 0.5 10*3/uL (ref 0.1–0.9)
Monocytes: 7 %
Neutrophils Absolute: 5.2 10*3/uL (ref 1.4–7.0)
Neutrophils: 65 %
Platelets: 352 10*3/uL (ref 150–450)
RBC: 3.91 x10E6/uL (ref 3.77–5.28)
RDW: 11 % — ABNORMAL LOW (ref 11.7–15.4)
WBC: 7.9 10*3/uL (ref 3.4–10.8)

## 2023-02-23 LAB — VITAMIN D 25 HYDROXY (VIT D DEFICIENCY, FRACTURES): Vit D, 25-Hydroxy: 35.3 ng/mL (ref 30.0–100.0)

## 2023-02-23 LAB — LIPID PANEL W/O CHOL/HDL RATIO
Cholesterol, Total: 226 mg/dL — ABNORMAL HIGH (ref 100–199)
HDL: 70 mg/dL (ref >39)
LDL Chol Calc (NIH): 133 mg/dL — ABNORMAL HIGH (ref 0–99)
Triglycerides: 134 mg/dL (ref 0–149)
VLDL Cholesterol Cal: 23 mg/dL (ref 5–40)

## 2023-02-23 LAB — VITAMIN B12: Vitamin B-12: 510 pg/mL (ref 232–1245)

## 2023-02-23 LAB — TSH: TSH: 0.604 u[IU]/mL (ref 0.450–4.500)

## 2023-02-24 NOTE — Telephone Encounter (Signed)
Requested medications are due for refill today.  unsure  Requested medications are on the active medications list.  yes  Last refill. 08/23/2022 #315 1 rf  Future visit scheduled.   yes  Notes to clinic.  Refill/refusal not delegated.    Requested Prescriptions  Pending Prescriptions Disp Refills   QUEtiapine (SEROQUEL) 25 MG tablet [Pharmacy Med Name: QUETIAPINE 25 MG TAB[*]] 315 tablet 1    Sig: TAKE ONE TO TWO TABLETS BY MOUTH ONE TIME DAILY AT BEDTIME; MAY TAKE ADDITIONAL ONE-HALF TALBLET UP TO THREE TIMES A DAY AS NEEDED FOR ANXIETY     Not Delegated - Psychiatry:  Antipsychotics - Second Generation (Atypical) - quetiapine Failed - 02/23/2023  6:47 PM      Failed - This refill cannot be delegated      Failed - Lipid Panel in normal range within the last 12 months    Cholesterol, Total  Date Value Ref Range Status  02/22/2023 226 (H) 100 - 199 mg/dL Final   Cholesterol Piccolo, Waived  Date Value Ref Range Status  10/13/2015 214 (H) <200 mg/dL Final    Comment:                            Desirable                <200                         Borderline High      200- 239                         High                     >239    LDL Chol Calc (NIH)  Date Value Ref Range Status  02/22/2023 133 (H) 0 - 99 mg/dL Final   HDL  Date Value Ref Range Status  02/22/2023 70 >39 mg/dL Final   Triglycerides  Date Value Ref Range Status  02/22/2023 134 0 - 149 mg/dL Final   Triglycerides Piccolo,Waived  Date Value Ref Range Status  10/13/2015 154 (H) <150 mg/dL Final    Comment:                            Normal                   <150                         Borderline High     150 - 199                         High                200 - 499                         Very High                >499          Failed - CBC within normal limits and completed in the last 12 months    WBC  Date Value Ref Range Status  02/22/2023 7.9 3.4 - 10.8 x10E3/uL Final  12/07/2017 9.8 3.6  -  11.0 K/uL Final   RBC  Date Value Ref Range Status  02/22/2023 3.91 3.77 - 5.28 x10E6/uL Final  12/07/2017 3.90 3.80 - 5.20 MIL/uL Final   Hemoglobin  Date Value Ref Range Status  02/22/2023 13.1 11.1 - 15.9 g/dL Final   Hematocrit  Date Value Ref Range Status  02/22/2023 40.4 34.0 - 46.6 % Final   MCHC  Date Value Ref Range Status  02/22/2023 32.4 31.5 - 35.7 g/dL Final  04/28/7251 66.4 32.0 - 36.0 g/dL Final   [facility]  Date Value Ref Range Status  02/22/2023 33.5 (H) 26.6 - 33.0 pg Final  12/07/2017 32.4 26.0 - 34.0 pg Final   MCV  Date Value Ref Range Status  02/22/2023 103 (H) 79 - 97 fL Final   No results found for: "PLTCOUNTKUC", "LABPLAT", "POCPLA" RDW  Date Value Ref Range Status  02/22/2023 11.0 (L) 11.7 - 15.4 % Final         Failed - CMP within normal limits and completed in the last 12 months    Albumin  Date Value Ref Range Status  02/22/2023 4.4 3.9 - 4.9 g/dL Final   Alkaline Phosphatase  Date Value Ref Range Status  02/22/2023 73 44 - 121 IU/L Final   ALT  Date Value Ref Range Status  02/22/2023 12 0 - 32 IU/L Final   AST  Date Value Ref Range Status  02/22/2023 20 0 - 40 IU/L Final   BUN  Date Value Ref Range Status  02/22/2023 20 8 - 27 mg/dL Final   Calcium  Date Value Ref Range Status  02/22/2023 9.9 8.7 - 10.3 mg/dL Final   CO2  Date Value Ref Range Status  02/22/2023 24 20 - 29 mmol/L Final   Creatinine, Ser  Date Value Ref Range Status  02/22/2023 0.62 0.57 - 1.00 mg/dL Final   Glucose  Date Value Ref Range Status  02/22/2023 86 70 - 99 mg/dL Final   Glucose, Bld  Date Value Ref Range Status  12/07/2017 105 (H) 70 - 99 mg/dL Final   Glucose-Capillary  Date Value Ref Range Status  10/18/2017 102 (H) 70 - 99 mg/dL Final   Potassium  Date Value Ref Range Status  02/22/2023 5.1 3.5 - 5.2 mmol/L Final   Sodium  Date Value Ref Range Status  02/22/2023 140 134 - 144 mmol/L Final   Bilirubin Total  Date Value Ref  Range Status  02/22/2023 0.4 0.0 - 1.2 mg/dL Final   Protein, ur  Date Value Ref Range Status  12/07/2017 NEGATIVE NEGATIVE mg/dL Final   Protein,UA  Date Value Ref Range Status  02/22/2023 Negative Negative/Trace Final   Total Protein  Date Value Ref Range Status  02/22/2023 6.6 6.0 - 8.5 g/dL Final   GFR calc Af Amer  Date Value Ref Range Status  05/27/2020 107 >59 mL/min/1.73 Final    Comment:    **In accordance with recommendations from the NKF-ASN Task force,**   [facility] is in the process of updating its eGFR calculation to the   2021 CKD-EPI creatinine equation that estimates kidney function   without a race variable.    eGFR  Date Value Ref Range Status  02/22/2023 98 >59 mL/min/1.73 Final   GFR calc non Af Amer  Date Value Ref Range Status  05/27/2020 93 >59 mL/min/1.73 Final         Passed - TSH in normal range and within 360 days    TSH  Date Value Ref Range Status  02/22/2023  0.604 0.450 - 4.500 uIU/mL Final         Passed - Completed PHQ-2 or PHQ-9 in the last 360 days      Passed - Last BP in normal range    BP Readings from Last 1 Encounters:  02/22/23 121/77         Passed - Last Heart Rate in normal range    Pulse Readings from Last 1 Encounters:  02/22/23 78         Passed - Valid encounter within last 6 months    Recent Outpatient Visits           2 days ago Routine general medical examination at a health care facility   [facility] [place], Megan P, DO   1 week ago Pyuria   [place] [facility] [place], Sherran Needs, NP   2 months ago URI, acute   [facility] [place], Sherran Needs, NP   2 months ago Jaw pain   [place] [facility] [place], [place] P, DO   3 months ago Severe episode of recurrent major depressive disorder, without psychotic features (HCC)   [place] [facility] [place], [place], DO       Future Appointments              In 6 months Johnson, Megan P, DO  [facility]            Signed Prescriptions Disp Refills   etodolac (LODINE) 400 MG tablet 180 tablet 1    Sig: TAKE ONE TABLET BY MOUTH TWICE A DAY AS NEEDED     Analgesics:  NSAIDS Failed - 02/23/2023  6:47 PM      Failed - Manual Review: Labs are only required if the patient has taken medication for more than 8 weeks.      Passed - Cr in normal range and within 360 days    Creatinine, Ser  Date Value Ref Range Status  02/22/2023 0.62 0.57 - 1.00 mg/dL Final         Passed - HGB in normal range and within 360 days    Hemoglobin  Date Value Ref Range Status  02/22/2023 13.1 11.1 - 15.9 g/dL Final         Passed - PLT in normal range and within 360 days    Platelets  Date Value Ref Range Status  02/22/2023 352 150 - 450 x10E3/uL Final         Passed - HCT in normal range and within 360 days    Hematocrit  Date Value Ref Range Status  02/22/2023 40.4 34.0 - 46.6 % Final         Passed - eGFR is 30 or above and within 360 days    GFR calc Af Amer  Date Value Ref Range Status  05/27/2020 107 >59 mL/min/1.73 Final    Comment:    **In accordance with recommendations from the NKF-ASN Task force,**   [facility] is in the process of updating its eGFR calculation to the   2021 CKD-EPI creatinine equation that estimates kidney function   without a race variable.    GFR calc non Af Amer  Date Value Ref Range Status  05/27/2020 93 >59 mL/min/1.73 Final   eGFR  Date Value Ref Range Status  02/22/2023 98 >59 mL/min/1.73 Final         Passed - Patient is not pregnant  Passed - Valid encounter within last 12 months    Recent Outpatient Visits           2 days ago Routine general medical examination at a health care facility   [facility] [place], [place] P, DO   1 week ago Pyuria   [facility] [place] [facility] [place], Sherran Needs, NP   2 months ago URI,  acute   [facility] [place], Sherran Needs, NP   2 months ago Jaw pain   [place] [facility] [place], [place] P, DO   3 months ago Severe episode of recurrent major depressive disorder, without psychotic features (HCC)   [place] [facility] [place] Dorcas Carrow, DO       Future Appointments             In 6 months Laural Benes, Oralia Rud, DO [place] [facility]            Refused Prescriptions Disp Refills   omeprazole (PRILOSEC) 20 MG capsule [Pharmacy Med Name: OMEPRAZOLE 20 MG CAP] 90 capsule 1    Sig: TAKE ONE CAPSULE BY MOUTH ONE TIME DAILY     Gastroenterology: Proton Pump Inhibitors Passed - 02/23/2023  6:47 PM      Passed - Valid encounter within last 12 months    Recent Outpatient Visits           2 days ago Routine general medical examination at a health care facility   [facility] [place], Megan P, DO   1 week ago Pyuria   [place] [facility] [place], Sherran Needs, NP   2 months ago URI, acute   [place] [facility] [place], Sherran Needs, NP   2 months ago Jaw pain   [place] [facility] [place], [place] P, DO   3 months ago Severe episode of recurrent major depressive disorder, without psychotic features [facility])   [place] [facility] Dorcas Carrow, DO       Future Appointments             In 6 months Laural Benes, Oralia Rud, DO [facility]

## 2023-02-24 NOTE — Telephone Encounter (Signed)
Requested Prescriptions  Pending Prescriptions Disp Refills   etodolac (LODINE) 400 MG tablet [Pharmacy Med Name: ETODOLAC 400 MG TAB] 180 tablet 1    Sig: TAKE ONE TABLET BY MOUTH TWICE A DAY AS NEEDED     Analgesics:  NSAIDS Failed - 02/23/2023  6:47 PM      Failed - Manual Review: Labs are only required if the patient has taken medication for more than 8 weeks.      Passed - Cr in normal range and within 360 days    Creatinine, Ser  Date Value Ref Range Status  02/22/2023 0.62 0.57 - 1.00 mg/dL Final         Passed - HGB in normal range and within 360 days    Hemoglobin  Date Value Ref Range Status  02/22/2023 13.1 11.1 - 15.9 g/dL Final         Passed - PLT in normal range and within 360 days    Platelets  Date Value Ref Range Status  02/22/2023 352 150 - 450 x10E3/uL Final         Passed - HCT in normal range and within 360 days    Hematocrit  Date Value Ref Range Status  02/22/2023 40.4 34.0 - 46.6 % Final         Passed - eGFR is 30 or above and within 360 days    GFR calc Af Amer  Date Value Ref Range Status  05/27/2020 107 >59 mL/min/1.73 Final    Comment:    **In accordance with recommendations from the NKF-ASN Task force,**   [facility] is in the process of updating its eGFR calculation to the   2021 CKD-EPI creatinine equation that estimates kidney function   without a race variable.    GFR calc non Af Amer  Date Value Ref Range Status  05/27/2020 93 >59 mL/min/1.73 Final   eGFR  Date Value Ref Range Status  02/22/2023 98 >59 mL/min/1.73 Final         Passed - Patient is not pregnant      Passed - Valid encounter within last 12 months    Recent Outpatient Visits           2 days ago Routine general medical examination at a health care facility   [facility] [place], Megan P, DO   1 week ago Pyuria   [place] [facility] [place], Sherran Needs, NP   2 months ago URI, acute   [place] [facility] [place], Sherran Needs, NP   2 months ago Jaw pain   [place] [facility] [place], [place] P, DO   3 months ago Severe episode of recurrent major depressive disorder, without psychotic features (HCC)   [facility] [place], Oralia Rud, DO       Future Appointments             In 6 months Johnson, Oralia Rud, DO [place] [facility]             QUEtiapine (SEROQUEL) 25 MG tablet [Pharmacy Med Name: QUETIAPINE 25 MG TAB[*]] 315 tablet 1    Sig: TAKE ONE TO TWO TABLETS BY MOUTH ONE TIME DAILY AT BEDTIME; MAY TAKE ADDITIONAL ONE-HALF TALBLET UP TO THREE TIMES A DAY AS NEEDED FOR ANXIETY     Not Delegated - Psychiatry:  Antipsychotics - Second Generation (Atypical) - quetiapine Failed - 02/23/2023  6:47 PM  Failed - This refill cannot be delegated      Failed - Lipid Panel in normal range within the last 12 months    Cholesterol, Total  Date Value Ref Range Status  02/22/2023 226 (H) 100 - 199 mg/dL Final   Cholesterol Piccolo, Waived  Date Value Ref Range Status  10/13/2015 214 (H) <200 mg/dL Final    Comment:                            Desirable                <200                         Borderline High      200- 239                         High                     >239    LDL Chol Calc (NIH)  Date Value Ref Range Status  02/22/2023 133 (H) 0 - 99 mg/dL Final   HDL  Date Value Ref Range Status  02/22/2023 70 >39 mg/dL Final   Triglycerides  Date Value Ref Range Status  02/22/2023 134 0 - 149 mg/dL Final   Triglycerides Piccolo,Waived  Date Value Ref Range Status  10/13/2015 154 (H) <150 mg/dL Final    Comment:                            Normal                   <150                         Borderline High     150 - 199                         High                200 - 499                         Very High                >499          Failed - CBC within normal limits and completed in the  last 12 months    WBC  Date Value Ref Range Status  02/22/2023 7.9 3.4 - 10.8 x10E3/uL Final  12/07/2017 9.8 3.6 - 11.0 K/uL Final   RBC  Date Value Ref Range Status  02/22/2023 3.91 3.77 - 5.28 x10E6/uL Final  12/07/2017 3.90 3.80 - 5.20 MIL/uL Final   Hemoglobin  Date Value Ref Range Status  02/22/2023 13.1 11.1 - 15.9 g/dL Final   Hematocrit  Date Value Ref Range Status  02/22/2023 40.4 34.0 - 46.6 % Final   MCHC  Date Value Ref Range Status  02/22/2023 32.4 31.5 - 35.7 g/dL Final  96/29/5284 13.2 32.0 - 36.0 g/dL Final   [facility]  Date Value Ref Range Status  02/22/2023 33.5 (H) 26.6 - 33.0 pg Final  12/07/2017 32.4 26.0 - 34.0 pg Final   MCV  Date Value Ref Range Status  02/22/2023 103 (  H) 79 - 97 fL Final   No results found for: "PLTCOUNTKUC", "LABPLAT", "POCPLA" RDW  Date Value Ref Range Status  02/22/2023 11.0 (L) 11.7 - 15.4 % Final         Failed - CMP within normal limits and completed in the last 12 months    Albumin  Date Value Ref Range Status  02/22/2023 4.4 3.9 - 4.9 g/dL Final   Alkaline Phosphatase  Date Value Ref Range Status  02/22/2023 73 44 - 121 IU/L Final   ALT  Date Value Ref Range Status  02/22/2023 12 0 - 32 IU/L Final   AST  Date Value Ref Range Status  02/22/2023 20 0 - 40 IU/L Final   BUN  Date Value Ref Range Status  02/22/2023 20 8 - 27 mg/dL Final   Calcium  Date Value Ref Range Status  02/22/2023 9.9 8.7 - 10.3 mg/dL Final   CO2  Date Value Ref Range Status  02/22/2023 24 20 - 29 mmol/L Final   Creatinine, Ser  Date Value Ref Range Status  02/22/2023 0.62 0.57 - 1.00 mg/dL Final   Glucose  Date Value Ref Range Status  02/22/2023 86 70 - 99 mg/dL Final   Glucose, Bld  Date Value Ref Range Status  12/07/2017 105 (H) 70 - 99 mg/dL Final   Glucose-Capillary  Date Value Ref Range Status  10/18/2017 102 (H) 70 - 99 mg/dL Final   Potassium  Date Value Ref Range Status  02/22/2023 5.1 3.5 - 5.2 mmol/L Final    Sodium  Date Value Ref Range Status  02/22/2023 140 134 - 144 mmol/L Final   Bilirubin Total  Date Value Ref Range Status  02/22/2023 0.4 0.0 - 1.2 mg/dL Final   Protein, ur  Date Value Ref Range Status  12/07/2017 NEGATIVE NEGATIVE mg/dL Final   Protein,UA  Date Value Ref Range Status  02/22/2023 Negative Negative/Trace Final   Total Protein  Date Value Ref Range Status  02/22/2023 6.6 6.0 - 8.5 g/dL Final   GFR calc Af Amer  Date Value Ref Range Status  05/27/2020 107 >59 mL/min/1.73 Final    Comment:    **In accordance with recommendations from the NKF-ASN Task force,**   [facility] is in the process of updating its eGFR calculation to the   2021 CKD-EPI creatinine equation that estimates kidney function   without a race variable.    eGFR  Date Value Ref Range Status  02/22/2023 98 >59 mL/min/1.73 Final   GFR calc non Af Amer  Date Value Ref Range Status  05/27/2020 93 >59 mL/min/1.73 Final         Passed - TSH in normal range and within 360 days    TSH  Date Value Ref Range Status  02/22/2023 0.604 0.450 - 4.500 uIU/mL Final         Passed - Completed PHQ-2 or PHQ-9 in the last 360 days      Passed - Last BP in normal range    BP Readings from Last 1 Encounters:  02/22/23 121/77         Passed - Last Heart Rate in normal range    Pulse Readings from Last 1 Encounters:  02/22/23 78         Passed - Valid encounter within last 6 months    Recent Outpatient Visits           2 days ago Routine general medical examination at a health care facility   [facility] [place], Megan P, DO   1 week ago Pyuria   [place] [facility] [place], Sherran Needs, NP   2 months ago URI, acute   [place] [facility] [place], Sherran Needs, NP   2 months ago Jaw pain   [place] [facility] [place], [place] P, DO   3 months ago Severe episode of recurrent major depressive disorder, without  psychotic features (HCC)   [place] Dorcas Carrow, DO       Future Appointments             In 6 months Laural Benes, Oralia Rud, DO [place] [facility]            Refused Prescriptions Disp Refills   omeprazole (PRILOSEC) 20 MG capsule [Pharmacy Med Name: OMEPRAZOLE 20 MG CAP] 90 capsule 1    Sig: TAKE ONE CAPSULE BY MOUTH ONE TIME DAILY     Gastroenterology: Proton Pump Inhibitors Passed - 02/23/2023  6:47 PM      Passed - Valid encounter within last 12 months    Recent Outpatient Visits           2 days ago Routine general medical examination at a health care facility   [facility] [place], Megan P, DO   1 week ago Pyuria   [place] [facility] [place], Sherran Needs, NP   2 months ago URI, acute   [place] [facility] [place], Sherran Needs, NP   2 months ago Jaw pain   [place] [facility] [place], [place] P, DO   3 months ago Severe episode of recurrent major depressive disorder, without psychotic features [facility])   [place] [facility] Dorcas Carrow, DO       Future Appointments             In 6 months Laural Benes, Oralia Rud, DO [facility]

## 2023-03-01 ENCOUNTER — Encounter: Payer: Self-pay | Admitting: Family Medicine

## 2023-03-01 ENCOUNTER — Other Ambulatory Visit: Payer: Self-pay | Admitting: Family Medicine

## 2023-03-01 MED ORDER — METRONIDAZOLE 500 MG PO TABS: 500.0000 mg | ORAL_TABLET | Freq: Two times a day (BID) | ORAL | 0 refills | Status: DC

## 2023-03-15 ENCOUNTER — Ambulatory Visit: Payer: Self-pay | Admitting: *Deleted

## 2023-03-15 NOTE — Telephone Encounter (Signed)
Summary: Dizziness   Patient states that she been experiencing dizziness for the last few weeks. Patient states that she had a bad dizzy spell today and it is making her feel very anxious.       Chief Complaint: Dizziness Symptoms: Spinning sensation when bending over, turning head quickly, slight nausea Frequency: few weeks but worsening Pertinent Negatives: Patient denies  Disposition: [] ED /[] Urgent Care (no appt availability in office) / [x] Appointment(In office/virtual)/ []  [place] [facility]/ [] Home Care/ [] Refused Recommended Disposition /[] [facility]/ []  Follow-up with PCP Additional Notes:  Pt has been evaluated before for vertigo. Appt secured for Thursday, unable to take 2:00 appt today with Jolene due to transportation issues.  Placed pt on wait list. Care advise provided, pt verbalizes understanding. Reason for Disposition  [1] MODERATE dizziness (e.g., vertigo; feels very unsteady, interferes with normal activities) AND [2] has been evaluated by doctor (or NP/PA) for this  Answer Assessment - Initial Assessment Questions 1. DESCRIPTION: "Describe your dizziness."     With quick movements, bending over 2. VERTIGO: "Do you feel like either you or the room is spinning or tilting?"      Yes 3. LIGHTHEADED: "Do you feel lightheaded?" (e.g., somewhat faint, woozy, weak upon standing)     Yes 4. SEVERITY: "How bad is it?"  "Can you walk?"   - MILD: Feels slightly dizzy and unsteady, but is walking normally.   - MODERATE: Feels unsteady when walking, but not falling; interferes with normal activities (e.g., school, work).   - SEVERE: Unable to walk without falling, or requires assistance to walk without falling.     Moderate 5. ONSET:  "When did the dizziness begin?"     Few weeks, worsening 6. AGGRAVATING FACTORS: "Does anything make it worse?" (e.g., standing, change in head position)     Turning, bending over 7. CAUSE: "What do you think is causing the  dizziness?"     Maybe left ear 8. RECURRENT SYMPTOM: "Have you had dizziness before?" If Yes, ask: "When was the last time?" "What happened that time?"     Yes, long ago 9. OTHER SYMPTOMS: "Do you have any other symptoms?" (e.g., headache, weakness, numbness, vomiting, earache)     "Fluid in ear." Slight nausea  Protocols used: Dizziness - Vertigo-A-AH

## 2023-03-16 ENCOUNTER — Ambulatory Visit: Payer: Medicare HMO

## 2023-03-16 ENCOUNTER — Other Ambulatory Visit: Payer: Medicare HMO

## 2023-03-17 ENCOUNTER — Ambulatory Visit: Payer: Medicare HMO | Admitting: Pediatrics

## 2023-04-06 ENCOUNTER — Ambulatory Visit: Payer: Medicare HMO

## 2023-04-06 ENCOUNTER — Other Ambulatory Visit: Payer: Medicare HMO

## 2023-04-25 ENCOUNTER — Encounter: Payer: Self-pay | Admitting: Nurse Practitioner

## 2023-04-25 ENCOUNTER — Ambulatory Visit (INDEPENDENT_AMBULATORY_CARE_PROVIDER_SITE_OTHER): Payer: Medicare HMO | Admitting: Nurse Practitioner

## 2023-04-25 VITALS — BP 142/77 | HR 68 | Temp 97.6°F | Ht 61.42 in | Wt 192.6 lb

## 2023-04-25 DIAGNOSIS — J029 Acute pharyngitis, unspecified: Secondary | ICD-10-CM | POA: Diagnosis not present

## 2023-04-25 DIAGNOSIS — R051 Acute cough: Secondary | ICD-10-CM | POA: Diagnosis not present

## 2023-04-25 DIAGNOSIS — J069 Acute upper respiratory infection, unspecified: Secondary | ICD-10-CM

## 2023-04-25 NOTE — Progress Notes (Signed)
BP (!) 142/77 (BP Location: Left Arm, Patient Position: Sitting, Cuff Size: Normal)   Pulse 68   Temp 97.6 F (36.4 C) (Oral)   Ht 5' 1.42" (1.56 m)   Wt 192 lb 9.6 oz (87.4 kg)   SpO2 100%   BMI 35.90 kg/m    Subjective:    Patient ID: Jaime Freeman, female    DOB: April 16, 1956, 67 y.o.   MRN: [id number]  HPI: Jaime Freeman is a 67 y.o. female  Chief Complaint  Patient presents with   Shortness of Breath    Pressure on the chest    Nasal Congestion    drainage   Sore Throat   possible dehydration   UPPER RESPIRATORY TRACT INFECTION Worst symptom: symptoms started on Saturday Fever: no Cough: no Shortness of breath: yes Wheezing: no Chest pain: no Chest tightness: no Chest congestion: no Nasal congestion: yes Runny nose: yes Post nasal drip: yes Sneezing: no Sore throat: yes Swollen glands: no Sinus pressure: no Headache: no Face pain: no Toothache: no Ear pain: no bilateral Ear pressure: yes bilateral Eyes red/itching:no Eye drainage/crusting: no  Vomiting: no Rash: no Fatigue: yes Sick contacts: no Strep contacts: no  Context: stable Recurrent sinusitis: no Relief with OTC cold/cough medications: no  Treatments attempted: none Feels like her mucus is really thick.  She did use her inhaler today.      Relevant past medical, surgical, family and social history reviewed and updated as indicated. Interim medical history since our last visit reviewed. Allergies and medications reviewed and updated.  Review of Systems  Constitutional:  Positive for fatigue. Negative for fever.  HENT:  Positive for congestion, postnasal drip, rhinorrhea and sore throat. Negative for dental problem, ear pain, sinus pressure, sinus pain and sneezing.   Respiratory:  Positive for shortness of breath. Negative for cough and wheezing.   Cardiovascular:  Negative for chest pain.  Gastrointestinal:  Negative for vomiting.  Skin:  Negative for rash.  Neurological:  Negative  for headaches.    Per HPI unless specifically indicated above     Objective:    BP (!) 142/77 (BP Location: Left Arm, Patient Position: Sitting, Cuff Size: Normal)   Pulse 68   Temp 97.6 F (36.4 C) (Oral)   Ht 5' 1.42" (1.56 m)   Wt 192 lb 9.6 oz (87.4 kg)   SpO2 100%   BMI 35.90 kg/m   Wt Readings from Last 3 Encounters:  04/25/23 192 lb 9.6 oz (87.4 kg)  02/22/23 192 lb 12.8 oz (87.5 kg)  02/15/23 192 lb 3.2 oz (87.2 kg)    Physical Exam Vitals and nursing note reviewed.  Constitutional:      General: She is not in acute distress.    Appearance: Normal appearance. She is normal weight. She is not ill-appearing, toxic-appearing or diaphoretic.  HENT:     Head: Normocephalic.     Right Ear: Tympanic membrane and external ear normal.     Left Ear: Tympanic membrane and external ear normal.     Nose: Rhinorrhea present. No congestion.     Mouth/Throat:     Mouth: Mucous membranes are moist.     Pharynx: Oropharynx is clear. Posterior oropharyngeal erythema present.  Eyes:     General:        Right eye: No discharge.        Left eye: No discharge.     Extraocular Movements: Extraocular movements intact.     Conjunctiva/sclera: Conjunctivae normal.  Pupils: Pupils are equal, round, and reactive to light.  Cardiovascular:     Rate and Rhythm: Normal rate and regular rhythm.     Heart sounds: No murmur heard. Pulmonary:     Effort: Pulmonary effort is normal. No respiratory distress.     Breath sounds: Normal breath sounds. No wheezing or rales.  Musculoskeletal:     Cervical back: Normal range of motion and neck supple.  Skin:    General: Skin is warm and dry.     Capillary Refill: Capillary refill takes less than 2 seconds.  Neurological:     General: No focal deficit present.     Mental Status: She is alert and oriented to person, place, and time. Mental status is at baseline.  Psychiatric:        Mood and Affect: Mood normal.        Behavior: Behavior  normal.        Thought Content: Thought content normal.        Judgment: Judgment normal.     Results for orders placed or performed in visit on 02/22/23  WET PREP FOR TRICH, YEAST, CLUE   Collection Time: 02/22/23 11:43 AM   Specimen: Urine   Urine  Result Value Ref Range   Trichomonas Exam Negative Negative   Yeast Exam Negative Negative   Clue Cell Exam Positive (A) Negative  Microalbumin, Urine Waived   Collection Time: 02/22/23 11:43 AM  Result Value Ref Range   Microalb, Ur Waived 30 (H) 0 - 19 mg/L   Creatinine, Urine Waived 50 10 - 300 mg/dL   Microalb/Creat Ratio 30-300 (H) <30 mg/g  Urinalysis, Routine w reflex microscopic   Collection Time: 02/22/23 11:43 AM  Result Value Ref Range   Specific Gravity, UA 1.025 1.005 - 1.030   pH, UA 6.0 5.0 - 7.5   Color, UA Yellow Yellow   Appearance Ur Clear Clear   Leukocytes,UA Negative Negative   Protein,UA Negative Negative/Trace   Glucose, UA Negative Negative   Ketones, UA Negative Negative   RBC, UA Negative Negative   Bilirubin, UA Negative Negative   Urobilinogen, Ur 0.2 0.2 - 1.0 mg/dL   Nitrite, UA Negative Negative   Microscopic Examination Comment   Bayer DCA Hb A1c Waived   Collection Time: 02/22/23 11:50 AM  Result Value Ref Range   HB A1C (BAYER DCA - WAIVED) 5.8 (H) 4.8 - 5.6 %  VITAMIN D 25 Hydroxy (Vit-D Deficiency, Fractures)   Collection Time: 02/22/23 11:52 AM  Result Value Ref Range   Vit D, 25-Hydroxy 35.3 30.0 - 100.0 ng/mL  B12   Collection Time: 02/22/23 11:52 AM  Result Value Ref Range   Vitamin B-12 510 232 - 1,245 pg/mL  CBC with Differential/Platelet   Collection Time: 02/22/23 11:52 AM  Result Value Ref Range   WBC 7.9 3.4 - 10.8 x10E3/uL   RBC 3.91 3.77 - 5.28 x10E6/uL   Hemoglobin 13.1 11.1 - 15.9 g/dL   Hematocrit 16.1 09.6 - 46.6 %   MCV 103 (H) 79 - 97 fL   MCH 33.5 (H) 26.6 - 33.0 pg   MCHC 32.4 31.5 - 35.7 g/dL   RDW 04.5 (L) 40.9 - 81.1 %   Platelets 352 150 - 450  x10E3/uL   Neutrophils 65 Not Estab. %   Lymphs 25 Not Estab. %   Monocytes 7 Not Estab. %   Eos 2 Not Estab. %   Basos 1 Not Estab. %   Neutrophils Absolute 5.2  1.4 - 7.0 x10E3/uL   Lymphocytes Absolute 2.0 0.7 - 3.1 x10E3/uL   Monocytes Absolute 0.5 0.1 - 0.9 x10E3/uL   EOS (ABSOLUTE) 0.1 0.0 - 0.4 x10E3/uL   Basophils Absolute 0.1 0.0 - 0.2 x10E3/uL   Immature Granulocytes 0 Not Estab. %   Immature Grans (Abs) 0.0 0.0 - 0.1 x10E3/uL  Comprehensive metabolic panel   Collection Time: 02/22/23 11:52 AM  Result Value Ref Range   Glucose 86 70 - 99 mg/dL   BUN 20 8 - 27 mg/dL   Creatinine, Ser 2.72 0.57 - 1.00 mg/dL   eGFR 98 >53 GU/YQI/3.47   BUN/Creatinine Ratio 32 (H) 12 - 28   Sodium 140 134 - 144 mmol/L   Potassium 5.1 3.5 - 5.2 mmol/L   Chloride 102 96 - 106 mmol/L   CO2 24 20 - 29 mmol/L   Calcium 9.9 8.7 - 10.3 mg/dL   Total Protein 6.6 6.0 - 8.5 g/dL   Albumin 4.4 3.9 - 4.9 g/dL   Globulin, Total 2.2 1.5 - 4.5 g/dL   Bilirubin Total 0.4 0.0 - 1.2 mg/dL   Alkaline Phosphatase 73 44 - 121 IU/L   AST 20 0 - 40 IU/L   ALT 12 0 - 32 IU/L  Lipid Panel w/o Chol/HDL Ratio   Collection Time: 02/22/23 11:52 AM  Result Value Ref Range   Cholesterol, Total 226 (H) 100 - 199 mg/dL   Triglycerides 425 0 - 149 mg/dL   HDL 70 >95 mg/dL   VLDL Cholesterol Cal 23 5 - 40 mg/dL   LDL Chol Calc (NIH) 638 (H) 0 - 99 mg/dL  TSH   Collection Time: 02/22/23 11:52 AM  Result Value Ref Range   TSH 0.604 0.450 - 4.500 uIU/mL      Assessment & Plan:   Problem List Items Addressed This Visit   None Visit Diagnoses       Upper respiratory virus    -  Primary   Symptoms likely viral in nature. Recommend OTC symptom management, stay well hydrated and rest.  Follow up if symptoms do not improve.     Acute cough       Relevant Orders   Novel Coronavirus, NAA ([facility])   Veritor Flu A/B Waived     Sore throat       Relevant Orders   Rapid Strep screen([facility])         Follow up plan: Return if symptoms worsen or fail to improve.

## 2023-04-27 LAB — NOVEL CORONAVIRUS, NAA: SARS-CoV-2, NAA: NOT DETECTED

## 2023-04-28 LAB — VERITOR FLU A/B WAIVED
Influenza A: NEGATIVE
Influenza B: NEGATIVE

## 2023-04-28 LAB — RAPID STREP SCREEN (MED CTR MEBANE ONLY): Strep Gp A Ag, IA W/Reflex: NEGATIVE

## 2023-04-28 LAB — CULTURE, GROUP A STREP

## 2023-05-24 ENCOUNTER — Other Ambulatory Visit: Payer: Self-pay | Admitting: Family Medicine

## 2023-05-24 DIAGNOSIS — F332 Major depressive disorder, recurrent severe without psychotic features: Secondary | ICD-10-CM

## 2023-05-26 NOTE — Telephone Encounter (Signed)
Requested medications are due for refill today.  no  Requested medications are on the active medications list.  yes  Last refill. 02/25/2023 #135 1 rf  Future visit scheduled.   yes  Notes to clinic.  Refill/refusal not delegated. Pt was given a 6 months supply 02/25/2023    Requested Prescriptions  Pending Prescriptions Disp Refills   QUEtiapine (SEROQUEL) 25 MG tablet [Pharmacy Med Name: QUETIAPINE FUMARATE 25 MG TAB] 315 tablet 1    Sig: TAKE ONE TO TWO TABLETS BY MOUTH ONE TIME DAILY AT BEDTIME; MAY TAKE ADDITIONAL ONE-HALF TALBLET UP TO THREE TIMES A DAY AS NEEDED FOR ANXIETY     Not Delegated - Psychiatry:  Antipsychotics - Second Generation (Atypical) - quetiapine Failed - 05/26/2023  1:37 PM      Failed - This refill cannot be delegated      Failed - Last BP in normal range    BP Readings from Last 1 Encounters:  04/25/23 (!) 142/77         Failed - Lipid Panel in normal range within the last 12 months    Cholesterol, Total  Date Value Ref Range Status  02/22/2023 226 (H) 100 - 199 mg/dL Final   Cholesterol Piccolo, Waived  Date Value Ref Range Status  10/13/2015 214 (H) <200 mg/dL Final    Comment:                            Desirable                <200                         Borderline High      200- 239                         High                     >239    LDL Chol Calc (NIH)  Date Value Ref Range Status  02/22/2023 133 (H) 0 - 99 mg/dL Final   HDL  Date Value Ref Range Status  02/22/2023 70 >39 mg/dL Final   Triglycerides  Date Value Ref Range Status  02/22/2023 134 0 - 149 mg/dL Final   Triglycerides Piccolo,Waived  Date Value Ref Range Status  10/13/2015 154 (H) <150 mg/dL Final    Comment:                            Normal                   <150                         Borderline High     150 - 199                         High                200 - 499                         Very High                >499  Passed - TSH in normal  range and within 360 days    TSH  Date Value Ref Range Status  02/22/2023 0.604 0.450 - 4.500 uIU/mL Final         Passed - Completed PHQ-2 or PHQ-9 in the last 360 days      Passed - Last Heart Rate in normal range    Pulse Readings from Last 1 Encounters:  04/25/23 68         Passed - Valid encounter within last 6 months    Recent Outpatient Visits           1 month ago Upper respiratory virus   [place] [facility] Larae Grooms, NP   3 months ago Routine general medical examination at a health care facility   [facility], Megan P, DO   3 months ago Pyuria   [place] [facility] [place], Sherran Needs, NP   5 months ago URI, acute   [place] [facility] [place], Sherran Needs, NP   5 months ago Jaw pain    [facility] [place], [place], DO       Future Appointments             In 2 months Johnson, Megan P, DO  [facility]            Passed - CBC within normal limits and completed in the last 12 months    WBC  Date Value Ref Range Status  02/22/2023 7.9 3.4 - 10.8 x10E3/uL Final  12/07/2017 9.8 3.6 - 11.0 K/uL Final   RBC  Date Value Ref Range Status  02/22/2023 3.91 3.77 - 5.28 x10E6/uL Final  12/07/2017 3.90 3.80 - 5.20 MIL/uL Final   Hemoglobin  Date Value Ref Range Status  02/22/2023 13.1 11.1 - 15.9 g/dL Final   Hematocrit  Date Value Ref Range Status  02/22/2023 40.4 34.0 - 46.6 % Final   MCHC  Date Value Ref Range Status  02/22/2023 32.4 31.5 - 35.7 g/dL Final  40/98/1191 47.8 32.0 - 36.0 g/dL Final   [facility]  Date Value Ref Range Status  02/22/2023 33.5 (H) 26.6 - 33.0 pg Final  12/07/2017 32.4 26.0 - 34.0 pg Final   MCV  Date Value Ref Range Status  02/22/2023 103 (H) 79 - 97 fL Final   No results found for: "PLTCOUNTKUC", "LABPLAT", "POCPLA" RDW  Date Value Ref Range Status  02/22/2023 11.0 (L)  11.7 - 15.4 % Final         Passed - CMP within normal limits and completed in the last 12 months    Albumin  Date Value Ref Range Status  02/22/2023 4.4 3.9 - 4.9 g/dL Final   Alkaline Phosphatase  Date Value Ref Range Status  02/22/2023 73 44 - 121 IU/L Final   ALT  Date Value Ref Range Status  02/22/2023 12 0 - 32 IU/L Final   AST  Date Value Ref Range Status  02/22/2023 20 0 - 40 IU/L Final   BUN  Date Value Ref Range Status  02/22/2023 20 8 - 27 mg/dL Final   Calcium  Date Value Ref Range Status  02/22/2023 9.9 8.7 - 10.3 mg/dL Final   CO2  Date Value Ref Range Status  02/22/2023 24 20 - 29 mmol/L Final   Creatinine, Ser  Date Value Ref Range Status  02/22/2023 0.62 0.57 - 1.00 mg/dL Final   Glucose  Date Value Ref Range Status  02/22/2023 86 70 - 99 mg/dL Final   Glucose, Bld  Date Value Ref Range Status  12/07/2017 105 (H) 70 - 99 mg/dL Final   Glucose-Capillary  Date Value Ref Range Status  10/18/2017 102 (H) 70 - 99 mg/dL Final   Potassium  Date Value Ref Range Status  02/22/2023 5.1 3.5 - 5.2 mmol/L Final   Sodium  Date Value Ref Range Status  02/22/2023 140 134 - 144 mmol/L Final   Bilirubin Total  Date Value Ref Range Status  02/22/2023 0.4 0.0 - 1.2 mg/dL Final   Protein, ur  Date Value Ref Range Status  12/07/2017 NEGATIVE NEGATIVE mg/dL Final   Protein,UA  Date Value Ref Range Status  02/22/2023 Negative Negative/Trace Final   Total Protein  Date Value Ref Range Status  02/22/2023 6.6 6.0 - 8.5 g/dL Final   GFR calc Af Amer  Date Value Ref Range Status  05/27/2020 107 >59 mL/min/1.73 Final    Comment:    **In accordance with recommendations from the NKF-ASN Task force,**   [facility] is in the process of updating its eGFR calculation to the   2021 CKD-EPI creatinine equation that estimates kidney function   without a race variable.    eGFR  Date Value Ref Range Status  02/22/2023 98 >59 mL/min/1.73 Final   GFR calc non  Af Amer  Date Value Ref Range Status  05/27/2020 93 >59 mL/min/1.73 Final         Signed Prescriptions Disp Refills   benazepril (LOTENSIN) 20 MG tablet 90 tablet 0    Sig: TAKE ONE TABLET BY MOUTH ONE TIME DAILY     Cardiovascular:  ACE Inhibitors Failed - 05/26/2023  1:37 PM      Failed - Last BP in normal range    BP Readings from Last 1 Encounters:  04/25/23 (!) 142/77         Passed - Cr in normal range and within 180 days    Creatinine, Ser  Date Value Ref Range Status  02/22/2023 0.62 0.57 - 1.00 mg/dL Final         Passed - K in normal range and within 180 days    Potassium  Date Value Ref Range Status  02/22/2023 5.1 3.5 - 5.2 mmol/L Final         Passed - Patient is not pregnant      Passed - Valid encounter within last 6 months    Recent Outpatient Visits           1 month ago Upper respiratory virus   [place] [facility] Larae Grooms, NP   3 months ago Routine general medical examination at a health care facility   [facility], [facility], DO   3 months ago Pyuria   [place] [facility] [place], Sherran Needs, NP   5 months ago URI, acute   [place] [facility] [place], Sherran Needs, NP   5 months ago Jaw pain   [place] [facility] [place], [place], DO       Future Appointments             In 2 months Laural Benes, Oralia Rud, DO [place] [facility]            Refused Prescriptions Disp Refills   omeprazole (PRILOSEC) 20 MG capsule [Pharmacy Med Name: OMEPRAZOLE DR 20 MG CAPSULE] 90 capsule 1  Sig: TAKE ONE CAPSULE BY MOUTH ONE TIME DAILY     Gastroenterology: Proton Pump Inhibitors Passed - 05/26/2023  1:37 PM      Passed - Valid encounter within last 12 months    Recent Outpatient Visits           1 month ago Upper respiratory virus   [place] [facility] Larae Grooms, NP   3 months ago Routine  general medical examination at a health care facility   [facility], Megan P, DO   3 months ago Pyuria   [place] [facility] [place], Sherran Needs, NP   5 months ago URI, acute   [place] [facility], Sherran Needs, NP   5 months ago Jaw pain   [place] [facility] [place], Oralia Rud, DO       Future Appointments             In 2 months Laural Benes, Oralia Rud, DO [place] [facility]

## 2023-05-26 NOTE — Telephone Encounter (Signed)
Requested Prescriptions  Pending Prescriptions Disp Refills   benazepril (LOTENSIN) 20 MG tablet [Pharmacy Med Name: BENAZEPRIL 20 MG TAB[*]] 90 tablet 0    Sig: TAKE ONE TABLET BY MOUTH ONE TIME DAILY     Cardiovascular:  ACE Inhibitors Failed - 05/26/2023  1:37 PM      Failed - Last BP in normal range    BP Readings from Last 1 Encounters:  04/25/23 (!) 142/77         Passed - Cr in normal range and within 180 days    Creatinine, Ser  Date Value Ref Range Status  02/22/2023 0.62 0.57 - 1.00 mg/dL Final         Passed - K in normal range and within 180 days    Potassium  Date Value Ref Range Status  02/22/2023 5.1 3.5 - 5.2 mmol/L Final         Passed - Patient is not pregnant      Passed - Valid encounter within last 6 months    Recent Outpatient Visits           1 month ago Upper respiratory virus   [place] [facility] Larae Grooms, NP   3 months ago Routine general medical examination at a health care facility   [facility], Megan P, DO   3 months ago Pyuria   [place] [facility] [place], Sherran Needs, NP   5 months ago URI, acute   [place] [facility] [place], Sherran Needs, NP   5 months ago Jaw pain   [place] [facility] [place], [place], DO       Future Appointments             In 2 months Johnson, Megan P, DO [place] [facility]             QUEtiapine (SEROQUEL) 25 MG tablet [Pharmacy Med Name: QUETIAPINE FUMARATE 25 MG TAB] 315 tablet 1    Sig: TAKE ONE TO TWO TABLETS BY MOUTH ONE TIME DAILY AT BEDTIME; MAY TAKE ADDITIONAL ONE-HALF TALBLET UP TO THREE TIMES A DAY AS NEEDED FOR ANXIETY     Not Delegated - Psychiatry:  Antipsychotics - Second Generation (Atypical) - quetiapine Failed - 05/26/2023  1:37 PM      Failed - This refill cannot be delegated      Failed - Last BP in normal range    BP Readings from Last 1  Encounters:  04/25/23 (!) 142/77         Failed - Lipid Panel in normal range within the last 12 months    Cholesterol, Total  Date Value Ref Range Status  02/22/2023 226 (H) 100 - 199 mg/dL Final   Cholesterol Piccolo, Waived  Date Value Ref Range Status  10/13/2015 214 (H) <200 mg/dL Final    Comment:                            Desirable                <200                         Borderline High      200- 239  High                     >239    LDL Chol Calc (NIH)  Date Value Ref Range Status  02/22/2023 133 (H) 0 - 99 mg/dL Final   HDL  Date Value Ref Range Status  02/22/2023 70 >39 mg/dL Final   Triglycerides  Date Value Ref Range Status  02/22/2023 134 0 - 149 mg/dL Final   Triglycerides Piccolo,Waived  Date Value Ref Range Status  10/13/2015 154 (H) <150 mg/dL Final    Comment:                            Normal                   <150                         Borderline High     150 - 199                         High                200 - 499                         Very High                >499          Passed - TSH in normal range and within 360 days    TSH  Date Value Ref Range Status  02/22/2023 0.604 0.450 - 4.500 uIU/mL Final         Passed - Completed PHQ-2 or PHQ-9 in the last 360 days      Passed - Last Heart Rate in normal range    Pulse Readings from Last 1 Encounters:  04/25/23 68         Passed - Valid encounter within last 6 months    Recent Outpatient Visits           1 month ago Upper respiratory virus   [place] [facility] Larae Grooms, NP   3 months ago Routine general medical examination at a health care facility   [facility], Megan P, DO   3 months ago Pyuria   [place] [facility] [place], Sherran Needs, NP   5 months ago URI, acute   [place] [facility] [place], Sherran Needs, NP   5 months ago Jaw pain   [facility] [place], [place], DO       Future Appointments             In 2 months Johnson, Megan P, DO  [facility]            Passed - CBC within normal limits and completed in the last 12 months    WBC  Date Value Ref Range Status  02/22/2023 7.9 3.4 - 10.8 x10E3/uL Final  12/07/2017 9.8 3.6 - 11.0 K/uL Final   RBC  Date Value Ref Range Status  02/22/2023 3.91 3.77 - 5.28 x10E6/uL Final  12/07/2017 3.90 3.80 - 5.20 MIL/uL Final   Hemoglobin  Date Value Ref Range Status  02/22/2023 13.1 11.1 - 15.9 g/dL Final   Hematocrit  Date  Value Ref Range Status  02/22/2023 40.4 34.0 - 46.6 % Final   MCHC  Date Value Ref Range Status  02/22/2023 32.4 31.5 - 35.7 g/dL Final  16/01/9603 54.0 32.0 - 36.0 g/dL Final   [facility]  Date Value Ref Range Status  02/22/2023 33.5 (H) 26.6 - 33.0 pg Final  12/07/2017 32.4 26.0 - 34.0 pg Final   MCV  Date Value Ref Range Status  02/22/2023 103 (H) 79 - 97 fL Final   No results found for: "PLTCOUNTKUC", "LABPLAT", "POCPLA" RDW  Date Value Ref Range Status  02/22/2023 11.0 (L) 11.7 - 15.4 % Final         Passed - CMP within normal limits and completed in the last 12 months    Albumin  Date Value Ref Range Status  02/22/2023 4.4 3.9 - 4.9 g/dL Final   Alkaline Phosphatase  Date Value Ref Range Status  02/22/2023 73 44 - 121 IU/L Final   ALT  Date Value Ref Range Status  02/22/2023 12 0 - 32 IU/L Final   AST  Date Value Ref Range Status  02/22/2023 20 0 - 40 IU/L Final   BUN  Date Value Ref Range Status  02/22/2023 20 8 - 27 mg/dL Final   Calcium  Date Value Ref Range Status  02/22/2023 9.9 8.7 - 10.3 mg/dL Final   CO2  Date Value Ref Range Status  02/22/2023 24 20 - 29 mmol/L Final   Creatinine, Ser  Date Value Ref Range Status  02/22/2023 0.62 0.57 - 1.00 mg/dL Final   Glucose  Date Value Ref Range Status  02/22/2023 86 70 - 99 mg/dL Final   Glucose, Bld   Date Value Ref Range Status  12/07/2017 105 (H) 70 - 99 mg/dL Final   Glucose-Capillary  Date Value Ref Range Status  10/18/2017 102 (H) 70 - 99 mg/dL Final   Potassium  Date Value Ref Range Status  02/22/2023 5.1 3.5 - 5.2 mmol/L Final   Sodium  Date Value Ref Range Status  02/22/2023 140 134 - 144 mmol/L Final   Bilirubin Total  Date Value Ref Range Status  02/22/2023 0.4 0.0 - 1.2 mg/dL Final   Protein, ur  Date Value Ref Range Status  12/07/2017 NEGATIVE NEGATIVE mg/dL Final   Protein,UA  Date Value Ref Range Status  02/22/2023 Negative Negative/Trace Final   Total Protein  Date Value Ref Range Status  02/22/2023 6.6 6.0 - 8.5 g/dL Final   GFR calc Af Amer  Date Value Ref Range Status  05/27/2020 107 >59 mL/min/1.73 Final    Comment:    **In accordance with recommendations from the NKF-ASN Task force,**   [facility] is in the process of updating its eGFR calculation to the   2021 CKD-EPI creatinine equation that estimates kidney function   without a race variable.    eGFR  Date Value Ref Range Status  02/22/2023 98 >59 mL/min/1.73 Final   GFR calc non Af Amer  Date Value Ref Range Status  05/27/2020 93 >59 mL/min/1.73 Final         Refused Prescriptions Disp Refills   omeprazole (PRILOSEC) 20 MG capsule [Pharmacy Med Name: OMEPRAZOLE DR 20 MG CAPSULE] 90 capsule 1    Sig: TAKE ONE CAPSULE BY MOUTH ONE TIME DAILY     Gastroenterology: Proton Pump Inhibitors Passed - 05/26/2023  1:37 PM      Passed - Valid encounter within last 12 months    Recent Outpatient Visits  1 month ago Upper respiratory virus   [place] [facility] Larae Grooms, NP   3 months ago Routine general medical examination at a health care facility   [place] [facility], Megan P, DO   3 months ago Pyuria   [place] [facility] [place], Sherran Needs, NP   5 months ago URI, acute   [place] [facility], Sherran Needs, NP   5 months ago Jaw pain   [place] [facility] [place], Oralia Rud, DO       Future Appointments             In 2 months Laural Benes, Oralia Rud, DO [place] [facility]

## 2023-05-31 ENCOUNTER — Other Ambulatory Visit: Payer: Self-pay | Admitting: Family Medicine

## 2023-05-31 DIAGNOSIS — F332 Major depressive disorder, recurrent severe without psychotic features: Secondary | ICD-10-CM

## 2023-05-31 MED ORDER — OMEPRAZOLE 20 MG PO CPDR: 20.0000 mg | DELAYED_RELEASE_CAPSULE | Freq: Every day | ORAL | 1 refills | Status: DC

## 2023-05-31 NOTE — Telephone Encounter (Signed)
Omeprazole already sent today in a separate refill encounter.

## 2023-05-31 NOTE — Telephone Encounter (Signed)
 [facility] called and spoke to Jaime Freeman, Pensions Consultant about the refill(s) seroquel  requested. Advised it was sent on 02/25/23 #315/1 refill(s). She says the patient picked up on 05/23/23 the last refill, so none are remaining. She says what the patient is asking for is a future refill to be on file for when it's due.

## 2023-05-31 NOTE — Telephone Encounter (Signed)
 Copied from CRM [id number]. Topic: General - Other >> May 31, 2023  8:07 AM Everette C wrote: Reason for CRM: Medication Refill - Most Recent Primary Care Visit:  Provider: MELVIN PAO Department: [facility] Visit Type: OFFICE VISIT Date: 04/25/2023  Medication: omeprazole  (PRILOSEC) 20 MG capsule [[id number]]  Has the patient contacted their pharmacy? Yes (Agent: If no, request that the patient contact the pharmacy for the refill. If patient does not wish to contact the pharmacy document the reason why and proceed with request.) (Agent: If yes, when and what did the pharmacy advise?)  Is this the correct pharmacy for this prescription? Yes If no, delete pharmacy and type the correct one.  This is the patient's preferred pharmacy:  [facility] [address] [facility] - [place], [place] - [address] [facility] [address] [facility] [address] [place] [address] Phone: [phone] Fax: [phone]   Has the prescription been filled recently? Yes  Is the patient out of the medication? Yes  Has the patient been seen for an appointment in the last year OR does the patient have an upcoming appointment? Yes  Can we respond through MyChart? No  Agent: Please be advised that Rx refills may take up to 3 business days. We ask that you follow-up with your pharmacy.

## 2023-05-31 NOTE — Telephone Encounter (Signed)
 Medication Refill -  Most Recent Primary Care Visit:  Provider: MELVIN PAO  Department: [facility]  Visit Type: OFFICE VISIT  Date: 04/25/2023  Medication:  omeprazole  (PRILOSEC) 20 MG capsule  QUEtiapine  (SEROQUEL ) 25 MG tablet    Has the patient contacted their pharmacy? Yes   Is this the correct pharmacy for this prescription? Yes  This is the patient's preferred pharmacy:  [facility] [address] [facility] - [place], [place] - [address] [facility] [address] [facility] [address] [place] [address] Phone: [phone] Fax: [phone]   Has the prescription been filled recently? Yes  Is the patient out of the medication? Yes  Has the patient been seen for an appointment in the last year OR does the patient have an upcoming appointment? Yes  Can we respond through MyChart? No  Agent: Please be advised that Rx refills may take up to 3 business days. We ask that you follow-up with your pharmacy.

## 2023-05-31 NOTE — Telephone Encounter (Signed)
 Requested medication (s) are due for refill today: Yes  Requested medication (s) are on the active medication list: Yes  Last refill:  02/25/23  Future visit scheduled: Yes  Notes to clinic:  Unable to refill per protocol, cannot delegate. Patient picked up last refill on 05/23/23 and would like this sent for future refill.     Requested Prescriptions  Pending Prescriptions Disp Refills   QUEtiapine  (SEROQUEL ) 25 MG tablet 315 tablet 1    Sig: TAKE ONE TO TWO TABLETS BY MOUTH ONE TIME DAILY AT BEDTIME; MAY TAKE ADDITIONAL ONE-HALF TALBLET UP TO THREE TIMES A DAY AS NEEDED FOR ANXIETY     Not Delegated - Psychiatry:  Antipsychotics - Second Generation (Atypical) - quetiapine  Failed - 05/31/2023  3:11 PM      Failed - This refill cannot be delegated      Failed - Last BP in normal range    BP Readings from Last 1 Encounters:  04/25/23 (!) 142/77         Failed - Lipid Panel in normal range within the last 12 months    Cholesterol, Total  Date Value Ref Range Status  02/22/2023 226 (H) 100 - 199 mg/dL Final   Cholesterol Piccolo, Waived  Date Value Ref Range Status  10/13/2015 214 (H) <200 mg/dL Final    Comment:                            Desirable                <200                         Borderline High      200- 239                         High                     >239    LDL Chol Calc (NIH)  Date Value Ref Range Status  02/22/2023 133 (H) 0 - 99 mg/dL Final   HDL  Date Value Ref Range Status  02/22/2023 70 >39 mg/dL Final   Triglycerides  Date Value Ref Range Status  02/22/2023 134 0 - 149 mg/dL Final   Triglycerides Piccolo,Waived  Date Value Ref Range Status  10/13/2015 154 (H) <150 mg/dL Final    Comment:                            Normal                   <150                         Borderline High     150 - 199                         High                200 - 499                         Very High                >499  Passed - TSH in  normal range and within 360 days    TSH  Date Value Ref Range Status  02/22/2023 0.604 0.450 - 4.500 uIU/mL Final         Passed - Completed PHQ-2 or PHQ-9 in the last 360 days      Passed - Last Heart Rate in normal range    Pulse Readings from Last 1 Encounters:  04/25/23 68         Passed - Valid encounter within last 6 months    Recent Outpatient Visits           1 month ago Upper respiratory virus   [place] [facility] Melvin Pao, NP   3 months ago Routine general medical examination at a health care facility   [facility], Megan P, DO   3 months ago Pyuria   [place] [facility] [place], Hyla Givens, NP   5 months ago URI, acute   [place] [facility] [place], Hyla Givens, NP   6 months ago Jaw pain   [place] [facility] [place], [place], DO       Future Appointments             In 2 months Johnson, Megan P, DO [place] [facility], [facility]            Passed - CBC within normal limits and completed in the last 12 months    WBC  Date Value Ref Range Status  02/22/2023 7.9 3.4 - 10.8 x10E3/uL Final  12/07/2017 9.8 3.6 - 11.0 K/uL Final   RBC  Date Value Ref Range Status  02/22/2023 3.91 3.77 - 5.28 x10E6/uL Final  12/07/2017 3.90 3.80 - 5.20 MIL/uL Final   Hemoglobin  Date Value Ref Range Status  02/22/2023 13.1 11.1 - 15.9 g/dL Final   Hematocrit  Date Value Ref Range Status  02/22/2023 40.4 34.0 - 46.6 % Final   MCHC  Date Value Ref Range Status  02/22/2023 32.4 31.5 - 35.7 g/dL Final  91/85/7980 66.0 32.0 - 36.0 g/dL Final   [facility]  Date Value Ref Range Status  02/22/2023 33.5 (H) 26.6 - 33.0 pg Final  12/07/2017 32.4 26.0 - 34.0 pg Final   MCV  Date Value Ref Range Status  02/22/2023 103 (H) 79 - 97 fL Final   No results found for: PLTCOUNTKUC, LABPLAT, POCPLA RDW  Date Value Ref Range Status  02/22/2023  11.0 (L) 11.7 - 15.4 % Final         Passed - CMP within normal limits and completed in the last 12 months    Albumin  Date Value Ref Range Status  02/22/2023 4.4 3.9 - 4.9 g/dL Final   Alkaline Phosphatase  Date Value Ref Range Status  02/22/2023 73 44 - 121 IU/L Final   ALT  Date Value Ref Range Status  02/22/2023 12 0 - 32 IU/L Final   AST  Date Value Ref Range Status  02/22/2023 20 0 - 40 IU/L Final   BUN  Date Value Ref Range Status  02/22/2023 20 8 - 27 mg/dL Final   Calcium   Date Value Ref Range Status  02/22/2023 9.9 8.7 - 10.3 mg/dL Final   CO2  Date Value Ref Range Status  02/22/2023 24 20 - 29 mmol/L Final   Creatinine, Ser  Date Value Ref Range Status  02/22/2023 0.62 0.57 - 1.00 mg/dL Final   Glucose  Date Value Ref Range Status  02/22/2023 86 70 - 99 mg/dL Final   Glucose, Bld  Date Value Ref Range Status  12/07/2017 105 (H) 70 - 99 mg/dL Final   Glucose-Capillary  Date Value Ref Range Status  10/18/2017 102 (H) 70 - 99 mg/dL Final   Potassium  Date Value Ref Range Status  02/22/2023 5.1 3.5 - 5.2 mmol/L Final   Sodium  Date Value Ref Range Status  02/22/2023 140 134 - 144 mmol/L Final   Bilirubin Total  Date Value Ref Range Status  02/22/2023 0.4 0.0 - 1.2 mg/dL Final   Protein, ur  Date Value Ref Range Status  12/07/2017 NEGATIVE NEGATIVE mg/dL Final   Protein,UA  Date Value Ref Range Status  02/22/2023 Negative Negative/Trace Final   Total Protein  Date Value Ref Range Status  02/22/2023 6.6 6.0 - 8.5 g/dL Final   GFR calc Af Amer  Date Value Ref Range Status  05/27/2020 107 >59 mL/min/1.73 Final    Comment:    **In accordance with recommendations from the NKF-ASN Task force,**   [facility] is in the process of updating its eGFR calculation to the   2021 CKD-EPI creatinine equation that estimates kidney function   without a race variable.    eGFR  Date Value Ref Range Status  02/22/2023 98 >59 mL/min/1.73 Final   GFR  calc non Af Amer  Date Value Ref Range Status  05/27/2020 93 >59 mL/min/1.73 Final

## 2023-05-31 NOTE — Telephone Encounter (Signed)
 Requested Prescriptions  Pending Prescriptions Disp Refills   omeprazole  (PRILOSEC) 20 MG capsule 90 capsule 1    Sig: Take 1 capsule (20 mg total) by mouth daily.     Gastroenterology: Proton Pump Inhibitors Passed - 05/31/2023  2:48 PM      Passed - Valid encounter within last 12 months    Recent Outpatient Visits           1 month ago Upper respiratory virus   [place] [facility] Melvin Pao, NP   3 months ago Routine general medical examination at a health care facility   [place] [facility], Megan P, DO   3 months ago Pyuria   [place] [facility] [place], Hyla Givens, NP   5 months ago URI, acute   [place] [facility], Hyla Givens, NP   6 months ago Jaw pain   [place] [place], Duwaine SQUIBB, DO       Future Appointments             In 2 months Vicci, Duwaine SQUIBB, DO [place] [facility], [facility]

## 2023-07-13 ENCOUNTER — Telehealth: Payer: Self-pay

## 2023-07-13 DIAGNOSIS — H259 Unspecified age-related cataract: Secondary | ICD-10-CM

## 2023-07-13 NOTE — Telephone Encounter (Signed)
 Copied from CRM [id number]. Topic: Referral - Request for Referral >> Jul 13, 2023  8:04 AM Franchot Heidelberg wrote: Eye doctor, surgeon.Pt has appt today at 1:50 pm. Needs a referral for insurance. Please call [phone] they can do the referral over the phone.   For Dr. Brooke Dare at [facility] center   Please advise once complete so patient is aware (transportation purposes, relying on family) >> Jul 13, 2023  8:22 AM Dondra Prader E wrote: Appt today is for cataract surgery >> Jul 13, 2023  8:13 AM Dondra Prader E wrote: Pt did not know that she needed a referral bc she has never needed one for an eye doctor, but since the provider is a specialist.

## 2023-08-08 ENCOUNTER — Encounter: Payer: Self-pay | Admitting: Ophthalmology

## 2023-08-08 DIAGNOSIS — H2512 Age-related nuclear cataract, left eye: Secondary | ICD-10-CM | POA: Diagnosis not present

## 2023-08-11 DIAGNOSIS — S0511XA Contusion of eyeball and orbital tissues, right eye, initial encounter: Secondary | ICD-10-CM | POA: Diagnosis not present

## 2023-08-15 ENCOUNTER — Ambulatory Visit
Admission: RE | Admit: 2023-08-15 | Discharge: 2023-08-15 | Disposition: A | Discharge status: Home / Self Care | Attending: Ophthalmology | Admitting: Ophthalmology

## 2023-08-15 ENCOUNTER — Encounter
Admission: RE | Disposition: A | Discharge status: Home / Self Care | Payer: Self-pay | Source: Home / Self Care | Attending: Ophthalmology

## 2023-08-15 ENCOUNTER — Ambulatory Visit: Payer: Self-pay | Admitting: Anesthesiology

## 2023-08-15 ENCOUNTER — Other Ambulatory Visit: Payer: Self-pay

## 2023-08-15 ENCOUNTER — Encounter: Payer: Self-pay | Admitting: Ophthalmology

## 2023-08-15 DIAGNOSIS — F319 Bipolar disorder, unspecified: Secondary | ICD-10-CM | POA: Diagnosis not present

## 2023-08-15 DIAGNOSIS — F418 Other specified anxiety disorders: Secondary | ICD-10-CM | POA: Diagnosis not present

## 2023-08-15 DIAGNOSIS — Z87891 Personal history of nicotine dependence: Secondary | ICD-10-CM | POA: Insufficient documentation

## 2023-08-15 DIAGNOSIS — Z8249 Family history of ischemic heart disease and other diseases of the circulatory system: Secondary | ICD-10-CM | POA: Insufficient documentation

## 2023-08-15 DIAGNOSIS — I739 Peripheral vascular disease, unspecified: Secondary | ICD-10-CM | POA: Diagnosis not present

## 2023-08-15 DIAGNOSIS — I10 Essential (primary) hypertension: Secondary | ICD-10-CM | POA: Insufficient documentation

## 2023-08-15 DIAGNOSIS — H25012 Cortical age-related cataract, left eye: Secondary | ICD-10-CM | POA: Diagnosis not present

## 2023-08-15 DIAGNOSIS — M1990 Unspecified osteoarthritis, unspecified site: Secondary | ICD-10-CM | POA: Diagnosis not present

## 2023-08-15 DIAGNOSIS — H2512 Age-related nuclear cataract, left eye: Secondary | ICD-10-CM | POA: Insufficient documentation

## 2023-08-15 HISTORY — DX: Unspecified mononeuropathy of bilateral lower limbs: G57.93

## 2023-08-15 HISTORY — PX: CATARACT EXTRACTION W/PHACO: SHX586

## 2023-08-15 HISTORY — DX: Dizziness and giddiness: R42

## 2023-08-15 SURGERY — PHACOEMULSIFICATION, CATARACT, WITH IOL INSERTION
Anesthesia: Monitor Anesthesia Care | Site: Eye | Laterality: Left

## 2023-08-15 MED ORDER — TETRACAINE HCL 0.5 % OP SOLN
1.0000 [drp] | OPHTHALMIC | Status: DC | PRN
  Administered 2023-08-15 (×3): 1 [drp] via OPHTHALMIC

## 2023-08-15 MED ORDER — FENTANYL CITRATE (PF) 100 MCG/2ML IJ SOLN
INTRAMUSCULAR | Status: DC | PRN
  Administered 2023-08-15: 25 ug via INTRAVENOUS
  Administered 2023-08-15: 50 ug via INTRAVENOUS
  Administered 2023-08-15: 25 ug via INTRAVENOUS

## 2023-08-15 MED ORDER — ARMC OPHTHALMIC DILATING DROPS
OPHTHALMIC | Status: AC
  Filled 2023-08-15: qty 0.5

## 2023-08-15 MED ORDER — ARMC OPHTHALMIC DILATING DROPS
1.0000 | OPHTHALMIC | Status: DC | PRN
  Administered 2023-08-15 (×3): 1 via OPHTHALMIC

## 2023-08-15 MED ORDER — MIDAZOLAM HCL 2 MG/2ML IJ SOLN
INTRAMUSCULAR | Status: AC
  Filled 2023-08-15: qty 2

## 2023-08-15 MED ORDER — LIDOCAINE HCL (PF) 2 % IJ SOLN
INTRAMUSCULAR | Status: DC | PRN
  Administered 2023-08-15: 4 mL via INTRAOCULAR

## 2023-08-15 MED ORDER — SIGHTPATH DOSE#1 BSS IO SOLN
INTRAOCULAR | Status: DC | PRN
  Administered 2023-08-15: 107 mL via OPHTHALMIC

## 2023-08-15 MED ORDER — TETRACAINE HCL 0.5 % OP SOLN
OPHTHALMIC | Status: AC
  Filled 2023-08-15: qty 4

## 2023-08-15 MED ORDER — SIGHTPATH DOSE#1 NA HYALUR & NA CHOND-NA HYALUR IO KIT
PACK | INTRAOCULAR | Status: DC | PRN
  Administered 2023-08-15: 1 via OPHTHALMIC

## 2023-08-15 MED ORDER — FENTANYL CITRATE (PF) 100 MCG/2ML IJ SOLN
INTRAMUSCULAR | Status: AC
Start: 2023-08-15 — End: ?
  Filled 2023-08-15: qty 2

## 2023-08-15 MED ORDER — MOXIFLOXACIN HCL 0.5 % OP SOLN
OPHTHALMIC | Status: DC | PRN
  Administered 2023-08-15: .2 mL via OPHTHALMIC

## 2023-08-15 MED ORDER — SIGHTPATH DOSE#1 BSS IO SOLN
INTRAOCULAR | Status: DC | PRN
  Administered 2023-08-15: 15 mL via INTRAOCULAR

## 2023-08-15 MED ORDER — MIDAZOLAM HCL 2 MG/2ML IJ SOLN
INTRAMUSCULAR | Status: DC | PRN
  Administered 2023-08-15: 2 mg via INTRAVENOUS

## 2023-08-15 SURGICAL SUPPLY — 12 items
CATARACT SUITE SIGHTPATH (MISCELLANEOUS) ×1 IMPLANT
DISSECTOR HYDRO NUCLEUS 50X22 (MISCELLANEOUS) ×1 IMPLANT
FEE CATARACT SUITE SIGHTPATH (MISCELLANEOUS) ×1 IMPLANT
GLOVE PI ULTRA LF STRL 7.5 (GLOVE) ×1 IMPLANT
GLOVE SURG POLYISOPRENE 8.5 (GLOVE) ×1 IMPLANT
GLOVE SURG PROTEXIS BL SZ6.5 (GLOVE) ×1 IMPLANT
GLOVE SURG SYN 6.5 PF PI BL (GLOVE) ×1 IMPLANT
GLOVE SURG SYN 8.5 PF PI BL (GLOVE) ×1 IMPLANT
LENS IOL TECNIS EYHANCE 24.0 (Intraocular Lens) IMPLANT
NDL FILTER BLUNT 18X1 1/2 (NEEDLE) ×1 IMPLANT
NEEDLE FILTER BLUNT 18X1 1/2 (NEEDLE) ×1 IMPLANT
SYR 3ML LL SCALE MARK (SYRINGE) ×1 IMPLANT

## 2023-08-15 NOTE — Discharge Instructions (Signed)

## 2023-08-15 NOTE — Op Note (Signed)
 OPERATIVE NOTE  Jaime Freeman [id number] 08/15/2023   PREOPERATIVE DIAGNOSIS:  Nuclear sclerotic cataract left eye.  H25.12   POSTOPERATIVE DIAGNOSIS:    Nuclear sclerotic cataract left eye.     PROCEDURE:  Phacoemusification with posterior chamber intraocular lens placement of the left eye   LENS:   Implant Name Type Inv. Item Serial No. Manufacturer Lot No. LRB No. Used Action  LENS IOL TECNIS EYHANCE 24.0 - [id number] Intraocular Lens LENS IOL TECNIS EYHANCE 24.0 [id number] [employer]  Left 1 Implanted      Procedure(s): PHACOEMULSIFICATION, CATARACT, WITH IOL INSERTION 11.53 00:57.5 (Left)  SURGEON:  Dusty Gin, MD, MPH   ANESTHESIA:  Topical with tetracaine  drops augmented with 1% preservative-free intracameral lidocaine .  ESTIMATED BLOOD LOSS: <1 mL   COMPLICATIONS:  None.   DESCRIPTION OF PROCEDURE:  The patient was identified in the holding room and transported to the operating room and placed in the supine position under the operating microscope.  The left eye was identified as the operative eye and it was prepped and draped in the usual sterile ophthalmic fashion.   A 1.0 millimeter clear-corneal paracentesis was made at the 5:00 position. 0.5 ml of preservative-free 1% lidocaine  with epinephrine  was injected into the anterior chamber.  The anterior chamber was filled with viscoelastic.  A 2.4 millimeter keratome was used to make a near-clear corneal incision at the 2:00 position.  A curvilinear capsulorrhexis was made with a cystotome and capsulorrhexis forceps.  Balanced salt  solution was used to hydrodissect and hydrodelineate the nucleus.   Phacoemulsification was then used in stop and chop fashion to remove the lens nucleus and epinucleus.  The remaining cortex was then removed using the irrigation and aspiration handpiece. Viscoelastic was then placed into the capsular bag to distend it for lens placement.  A lens was then injected into the capsular bag.  The  remaining viscoelastic was aspirated.   Wounds were hydrated with balanced salt  solution.  The anterior chamber was inflated to a physiologic pressure with balanced salt  solution.  Intracameral vigamox  0.1 mL undiltued was injected into the eye and a drop placed onto the ocular surface. No wound leaks were noted.  The patient was taken to the recovery room in stable condition without complications of anesthesia or surgery  Dusty Gin 08/15/2023, 9:45 AM

## 2023-08-15 NOTE — Transfer of Care (Signed)
 Immediate Anesthesia Transfer of Care Note  Patient: Lady Pier  Procedure(s) Performed: PHACOEMULSIFICATION, CATARACT, WITH IOL INSERTION 11.53 00:57.5 (Left: Eye)  Patient Location: PACU  Anesthesia Type: MAC  Level of Consciousness: awake, alert  and patient cooperative  Airway and Oxygen Therapy: Patient Spontanous Breathing and Patient connected to supplemental oxygen  Post-op Assessment: Post-op Vital signs reviewed, Patient's Cardiovascular Status Stable, Respiratory Function Stable, Patent Airway and No signs of Nausea or vomiting  Post-op Vital Signs: Reviewed and stable  Complications: No notable events documented.

## 2023-08-15 NOTE — Anesthesia Postprocedure Evaluation (Signed)
 Anesthesia Post Note  Patient: Jaime Freeman  Procedure(s) Performed: PHACOEMULSIFICATION, CATARACT, WITH IOL INSERTION 11.53 00:57.5 (Left: Eye)  Patient location during evaluation: PACU Anesthesia Type: MAC Level of consciousness: awake and alert Pain management: pain level controlled Vital Signs Assessment: post-procedure vital signs reviewed and stable Respiratory status: spontaneous breathing, nonlabored ventilation and respiratory function stable Cardiovascular status: stable and blood pressure returned to baseline Postop Assessment: no apparent nausea or vomiting Anesthetic complications: no   No notable events documented.   Last Vitals:  Vitals:   08/15/23 0950 08/15/23 0952  BP:  119/69  Pulse: 71 70  Resp: 13 10  Temp:  (!) 36.2 C  SpO2: 100% 100%    Last Pain:  Vitals:   08/15/23 0952  TempSrc:   PainSc: 0-No pain                 Baltazar Bonier

## 2023-08-15 NOTE — H&P (Signed)
 [facility]   Primary Care Physician:  Solomon Dupre, DO Ophthalmologist: Dr. Dusty Gin  Pre-Procedure History & Physical: HPI:  Jaime Freeman is a 68 y.o. female here for cataract surgery.   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 Authorizing Provider  albuterol  (PROVENTIL ) (2.5 MG/3ML) 0.083% nebulizer solution Take 3 mLs (2.5 mg total) by nebulization every 6 (six) hours as needed for wheezing or shortness of breath. 08/23/22  Yes Johnson, Megan P, DO  albuterol  (VENTOLIN  HFA) 108 (90 Base) MCG/ACT inhaler Inhale 2 puffs into the lungs every 4 (four) hours as needed for wheezing or shortness of breath. 08/23/22  Yes Johnson, Megan P, DO  Ascorbic Acid (VITAMIN C PO) Take by mouth.   Yes [provider]  aspirin  EC 81 MG tablet Take 81 mg by mouth daily.   Yes [provider]  benazepril  (LOTENSIN ) 20 MG tablet TAKE ONE TABLET BY MOUTH ONE TIME DAILY 05/26/23  Yes Johnson, Megan P, DO  bisacodyl  (DULCOLAX) 5 MG EC tablet Take 5 mg by mouth daily as needed for moderate constipation.   Yes [provider]  clonazePAM  (KLONOPIN ) 0.5 MG tablet Take 1 tablet (0.5 mg total) by mouth 2 (two) times daily as needed for anxiety. 10/14/22  Yes Johnson, Megan P, DO  etodolac  (LODINE ) 400 MG tablet TAKE ONE TABLET BY  MOUTH TWICE A DAY AS NEEDED 02/24/23  Yes Johnson, Megan P, DO  fluticasone  (FLONASE ) 50 MCG/ACT nasal spray Place 2 sprays into both nostrils daily. 08/23/22  Yes Johnson, Megan P, DO  loratadine (CLARITIN) 10 MG tablet Take 10 mg by mouth daily as needed for allergies.   Yes [provider]  Multiple Vitamin (MULTI VITAMIN PO) Take by mouth.   Yes [provider]  mupirocin  ointment (BACTROBAN ) 2 % Apply 1 Application topically 2 (two) times daily. 02/03/22  Yes Mecum, Erin E, PA-C  nystatin  ointment (MYCOSTATIN ) Apply 1 Application topically 2 (two) times daily. 12/08/21  Yes Johnson, Megan P, DO  Olopatadine-Mometasone (RYALTRIS ) 665-25 MCG/ACT SUSP Place 2 sprays into the nose daily as needed. 02/15/23  Yes Pearley, Basilia Lima, NP  omeprazole  (PRILOSEC) 20 MG capsule Take 1 capsule (20 mg total) by mouth daily. 05/31/23  Yes Johnson, Megan P, DO  QUEtiapine  (SEROQUEL ) 25 MG tablet TAKE ONE TO TWO TABLETS BY MOUTH ONE TIME DAILY AT BEDTIME; MAY TAKE ADDITIONAL ONE-HALF TALBLET UP TO THREE TIMES A DAY AS NEEDED FOR ANXIETY 02/25/23  Yes Johnson, Megan P, DO  senna (SENOKOT) 8.6 MG tablet Take 1 tablet by mouth daily. Take 2 tablets at night   Yes [provider]  diclofenac  Sodium (VOLTAREN ) 1 % GEL Apply 4 g topically 4 (four) times daily. Patient not taking: Reported on 08/08/2023 08/31/22   Terre Ferri P, DO  metroNIDAZOLE  (FLAGYL ) 500 MG tablet Take 1 tablet (500 mg total) by mouth 2 (two) times daily. Patient not taking: Reported on 08/08/2023 03/01/23   Terre Ferri P, DO    Allergies as of 07/15/2023 - Review Complete 04/25/2023  Allergen Reaction Noted   Codeine Other (See Comments) 12/25/2014   Dynacirc [isradipine]  11/14/2014   Guaifenesin & derivatives  11/14/2014   Influenza vaccines  12/08/2021   Other Other (See Comments) 09/05/2012   Sudafed [pseudoephedrine]  11/14/2014   Zithromax [azithromycin]  11/14/2014   Prednisone Anxiety 11/22/2016     Family History  Problem Relation Age of Onset   Heart disease Mother    Heart attack Mother    Hypertension Mother    Cancer Father        GI tract   Hypertension Father    Cancer Maternal Grandmother        gallbladder   Heart disease Maternal Grandfather    COPD Neg Hx    Diabetes Neg Hx    Stroke Neg Hx     Social History   Socioeconomic History   Marital status: Widowed    Spouse name: Not on file   Number of children: 2   Years of education: Not on file   Highest education level: Not on file  Occupational History   Occupation: retired  Tobacco Use   Smoking status: Former    Current packs/day: 0.00    Average packs/day: 0.5 packs/day for 17.0 years (8.5 ttl pk-yrs)    Types: Cigarettes    Start date: 74    Quit date: 04/27/1987    Years since quitting: 36.3   Smokeless tobacco: Never  Vaping Use   Vaping status: Never Used  Substance and Sexual Activity   Alcohol  use: No   Drug use: No   Sexual activity: Not Currently  Other Topics Concern   Not on file  Social History Narrative   Not on file   Social Drivers of Health   Financial Resource Strain: Low Risk  (01/04/2023)   Overall Financial Resource Strain (CARDIA)    Difficulty of Paying Living Expenses: Not hard at all  Food Insecurity: No Food Insecurity (01/04/2023)   Hunger Vital Sign    Worried About Running Out of Food in the Last Year: Never true    Ran Out of Food in the Last Year: Never true  Transportation Needs: No Transportation Needs (01/04/2023)   PRAPARE - Administrator, Civil Service (Medical): No    Lack of Transportation (Non-Medical): No  Physical Activity: Inactive (01/04/2023)   Exercise Vital Sign    Days of Exercise per Week: 0 days    Minutes of Exercise per Session: 0 min  Stress: No Stress Concern Present (01/04/2023)   [employer] of Occupational Health - Occupational Stress Questionnaire    Feeling of Stress : Not at all  Social Connections:  Moderately Isolated (01/04/2023)   Social Connection and Isolation Panel [NHANES]    Frequency of Communication with Friends and Family: More than three times a week    Frequency of Social Gatherings with Friends and Family: More than three times a week    Attends Religious Services: More than 4 times per year    Active Member of [employer] or Organizations: No    Attends Banker  Meetings: Never    Marital Status: Widowed  Intimate Partner Violence: Not At Risk (01/04/2023)   Humiliation, Afraid, Rape, and Kick questionnaire    Fear of Current or Ex-Partner: No    Emotionally Abused: No    Physically Abused: No    Sexually Abused: No    Review of Systems: See HPI, otherwise negative ROS  Physical Exam: BP (!) 146/75   Temp 98.1 F (36.7 C) (Temporal)   Resp 18   Ht 5\' 1"  (1.549 m)   Wt 87.5 kg   SpO2 99%   BMI 36.47 kg/m  General:   Alert, cooperative. Head:  Normocephalic and atraumatic. Respiratory:  Normal work of breathing. Cardiovascular:  NAD  Impression/Plan: Lady Pier is here for cataract surgery.  Risks, benefits, limitations, and alternatives regarding cataract surgery have been reviewed with the patient.  Questions have been answered.  All parties agreeable.   Dusty Gin, MD  08/15/2023, 9:20 AM

## 2023-08-15 NOTE — Anesthesia Preprocedure Evaluation (Signed)
 Anesthesia Evaluation  Patient identified by MRN, date of birth, ID band Patient awake    Reviewed: Allergy & Precautions, H&P , NPO status , Patient's Chart, lab work & pertinent test results  Airway Mallampati: III  TM Distance: <3 FB Neck ROM: Full    Dental no notable dental hx. (+) Caps Multiple missing, all upper row of teeth are caps or implants, none chipped:   Pulmonary neg pulmonary ROS, former smoker   Pulmonary exam normal breath sounds clear to auscultation       Cardiovascular hypertension, + Peripheral Vascular Disease  negative cardio ROS Normal cardiovascular exam Rhythm:Regular Rate:Normal     Neuro/Psych  PSYCHIATRIC DISORDERS Anxiety Depression Bipolar Disorder    Neuromuscular disease negative neurological ROS  negative psych ROS   GI/Hepatic negative GI ROS, Neg liver ROS, hiatal hernia,,,  Endo/Other  negative endocrine ROS    Renal/GU Renal diseasenegative Renal ROS  negative genitourinary   Musculoskeletal negative musculoskeletal ROS (+) Arthritis ,    Abdominal   Peds negative pediatric ROS (+)  Hematology negative hematology ROS (+)   Anesthesia Other Findings Medical History NOTE PANIC DISORDER  Morbid obesity (HCC) Menopausal state Hypertension IFG (impaired fasting glucose) Vitamin D  deficiency disease Vitamin B12 deficiency Bipolar affective disorder (HCC) Depression Anxiety Panic disorder OCD (obsessive compulsive disorder) Hiatal hernia Allergic rhinitis Restrictive lung disease Lithium  toxicity Urticaria Angio-edema Neuropathy of both feet Vertigo     Reproductive/Obstetrics negative OB ROS                              Anesthesia Physical Anesthesia Plan  ASA: 3  Anesthesia Plan: MAC   Post-op Pain Management:    Induction: Intravenous  PONV Risk Score and Plan:   Airway Management Planned: Natural Airway and Nasal  Cannula  Additional Equipment:   Intra-op Plan:   Post-operative Plan:   Informed Consent: I have reviewed the patients History and Physical, chart, labs and discussed the procedure including the risks, benefits and alternatives for the proposed anesthesia with the patient or authorized representative who has indicated his/her understanding and acceptance.     Dental Advisory Given  Plan Discussed with: Anesthesiologist, CRNA and Surgeon  Anesthesia Plan Comments: (Patient consented for risks of anesthesia including but not limited to:  - adverse reactions to medications - damage to eyes, teeth, lips or other oral mucosa - nerve damage due to positioning  - sore throat or hoarseness - Damage to heart, brain, nerves, lungs, other parts of body or loss of life  Patient voiced understanding and assent.)         Anesthesia Quick Evaluation

## 2023-08-22 ENCOUNTER — Other Ambulatory Visit: Payer: Self-pay | Admitting: Family Medicine

## 2023-08-22 DIAGNOSIS — F332 Major depressive disorder, recurrent severe without psychotic features: Secondary | ICD-10-CM

## 2023-08-22 NOTE — Anesthesia Preprocedure Evaluation (Addendum)
 Anesthesia Evaluation  Patient identified by MRN, date of birth, ID band Patient awake    Reviewed: Allergy & Precautions, H&P , NPO status , Patient's Chart, lab work & pertinent test results  Airway Mallampati: III  TM Distance: <3 FB Neck ROM: Full    Dental no notable dental hx. (+) Caps Multiple missing, all upper row of teeth are caps or implants, none chipped:   Pulmonary former smoker   Pulmonary exam normal breath sounds clear to auscultation       Cardiovascular hypertension, + Peripheral Vascular Disease  Normal cardiovascular exam Rhythm:Regular Rate:Normal     Neuro/Psych  PSYCHIATRIC DISORDERS Anxiety Depression Bipolar Disorder    Neuromuscular disease    GI/Hepatic Neg liver ROS, hiatal hernia,,,  Endo/Other  negative endocrine ROS    Renal/GU Renal disease  negative genitourinary   Musculoskeletal  (+) Arthritis ,    Abdominal  (+) + obese  Peds negative pediatric ROS (+)  Hematology negative hematology ROS (+)   Anesthesia Other Findings Previous cataract surgery 08-15-23  Morbid obesity (HCC)  Menopausal state Hypertension  IFG (impaired fasting glucose) Vitamin D  deficiency disease  Vitamin B12 deficiency Bipolar affective disorder (HCC) Depression Anxiety  Panic disorder OCD (obsessive compulsive disorder) Hiatal hernia Allergic rhinitis Restrictive lung disease Lithium  toxicity  Urticaria Angio-edema  Neuropathy of both feet Vertigo      Reproductive/Obstetrics negative OB ROS                             Anesthesia Physical Anesthesia Plan  ASA: 3  Anesthesia Plan: MAC   Post-op Pain Management:    Induction: Intravenous  PONV Risk Score and Plan:   Airway Management Planned: Natural Airway and Nasal Cannula  Additional Equipment:   Intra-op Plan:   Post-operative Plan:   Informed Consent: I have reviewed the patients History and  Physical, chart, labs and discussed the procedure including the risks, benefits and alternatives for the proposed anesthesia with the patient or authorized representative who has indicated his/her understanding and acceptance.       Plan Discussed with: Anesthesiologist, CRNA and Surgeon  Anesthesia Plan Comments:         Anesthesia Quick Evaluation

## 2023-08-23 ENCOUNTER — Ambulatory Visit: Payer: Self-pay | Admitting: Family Medicine

## 2023-08-24 DIAGNOSIS — H2511 Age-related nuclear cataract, right eye: Secondary | ICD-10-CM | POA: Diagnosis not present

## 2023-08-25 NOTE — Telephone Encounter (Signed)
 Over due for follow up

## 2023-08-25 NOTE — Discharge Instructions (Signed)

## 2023-08-25 NOTE — Telephone Encounter (Signed)
 Requested medication (s) are due for refill today: yes  Requested medication (s) are on the active medication list: yes  Last refill:  02/25/23  Future visit scheduled: no  Notes to clinic:  Unable to refill per protocol, cannot delegate.      Requested Prescriptions  Pending Prescriptions Disp Refills   QUEtiapine  (SEROQUEL ) 25 MG tablet [Pharmacy Med Name: QUETIAPINE  FUMARATE 25 MG TAB] 315 tablet 1    Sig: TAKE ONE TO TWO TABLETS BY MOUTH ONE TIME DAILY AT BEDTIME; MAY TAKE ADDITIONAL ONE-HALF TALBLET UP TO THREE TIMES A DAY AS NEEDED FOR ANXIETY     Not Delegated - Psychiatry:  Antipsychotics - Second Generation (Atypical) - quetiapine  Failed - 08/25/2023  9:18 AM      Failed - This refill cannot be delegated      Failed - Valid encounter within last 6 months    Recent Outpatient Visits   None            Failed - Lipid Panel in normal range within the last 12 months    Cholesterol, Total  Date Value Ref Range Status  02/22/2023 226 (H) 100 - 199 mg/dL Final   Cholesterol Piccolo, Waived  Date Value Ref Range Status  10/13/2015 214 (H) <200 mg/dL Final    Comment:                            Desirable                <200                         Borderline High      200- 239                         High                     >239    LDL Chol Calc (NIH)  Date Value Ref Range Status  02/22/2023 133 (H) 0 - 99 mg/dL Final   HDL  Date Value Ref Range Status  02/22/2023 70 >39 mg/dL Final   Triglycerides  Date Value Ref Range Status  02/22/2023 134 0 - 149 mg/dL Final   Triglycerides Piccolo,Waived  Date Value Ref Range Status  10/13/2015 154 (H) <150 mg/dL Final    Comment:                            Normal                   <150                         Borderline High     150 - 199                         High                200 - 499                         Very High                >499          Passed - TSH in  normal range and within 360 days    TSH   Date Value Ref Range Status  02/22/2023 0.604 0.450 - 4.500 uIU/mL Final         Passed - Completed PHQ-2 or PHQ-9 in the last 360 days      Passed - Last BP in normal range    BP Readings from Last 1 Encounters:  08/15/23 119/69         Passed - Last Heart Rate in normal range    Pulse Readings from Last 1 Encounters:  08/15/23 70         Passed - CBC within normal limits and completed in the last 12 months    WBC  Date Value Ref Range Status  02/22/2023 7.9 3.4 - 10.8 x10E3/uL Final  12/07/2017 9.8 3.6 - 11.0 K/uL Final   RBC  Date Value Ref Range Status  02/22/2023 3.91 3.77 - 5.28 x10E6/uL Final  12/07/2017 3.90 3.80 - 5.20 MIL/uL Final   Hemoglobin  Date Value Ref Range Status  02/22/2023 13.1 11.1 - 15.9 g/dL Final   Hematocrit  Date Value Ref Range Status  02/22/2023 40.4 34.0 - 46.6 % Final   MCHC  Date Value Ref Range Status  02/22/2023 32.4 31.5 - 35.7 g/dL Final  16/01/9603 54.0 32.0 - 36.0 g/dL Final   [facility]  Date Value Ref Range Status  02/22/2023 33.5 (H) 26.6 - 33.0 pg Final  12/07/2017 32.4 26.0 - 34.0 pg Final   MCV  Date Value Ref Range Status  02/22/2023 103 (H) 79 - 97 fL Final   No results found for: "PLTCOUNTKUC", "LABPLAT", "POCPLA" RDW  Date Value Ref Range Status  02/22/2023 11.0 (L) 11.7 - 15.4 % Final         Passed - CMP within normal limits and completed in the last 12 months    Albumin  Date Value Ref Range Status  02/22/2023 4.4 3.9 - 4.9 g/dL Final   Alkaline Phosphatase  Date Value Ref Range Status  02/22/2023 73 44 - 121 IU/L Final   ALT  Date Value Ref Range Status  02/22/2023 12 0 - 32 IU/L Final   AST  Date Value Ref Range Status  02/22/2023 20 0 - 40 IU/L Final   BUN  Date Value Ref Range Status  02/22/2023 20 8 - 27 mg/dL Final   Calcium   Date Value Ref Range Status  02/22/2023 9.9 8.7 - 10.3 mg/dL Final   CO2  Date Value Ref Range Status  02/22/2023 24 20 - 29 mmol/L Final   Creatinine, Ser   Date Value Ref Range Status  02/22/2023 0.62 0.57 - 1.00 mg/dL Final   Glucose  Date Value Ref Range Status  02/22/2023 86 70 - 99 mg/dL Final   Glucose, Bld  Date Value Ref Range Status  12/07/2017 105 (H) 70 - 99 mg/dL Final   Glucose-Capillary  Date Value Ref Range Status  10/18/2017 102 (H) 70 - 99 mg/dL Final   Potassium  Date Value Ref Range Status  02/22/2023 5.1 3.5 - 5.2 mmol/L Final   Sodium  Date Value Ref Range Status  02/22/2023 140 134 - 144 mmol/L Final   Bilirubin Total  Date Value Ref Range Status  02/22/2023 0.4 0.0 - 1.2 mg/dL Final   Protein, ur  Date Value Ref Range Status  12/07/2017 NEGATIVE NEGATIVE mg/dL Final   Protein,UA  Date Value Ref Range Status  02/22/2023 Negative Negative/Trace Final   Total Protein  Date Value  Ref Range Status  02/22/2023 6.6 6.0 - 8.5 g/dL Final   GFR calc Af Amer  Date Value Ref Range Status  05/27/2020 107 >59 mL/min/1.73 Final    Comment:    **In accordance with recommendations from the NKF-ASN Task force,**   [facility] is in the process of updating its eGFR calculation to the   2021 CKD-EPI creatinine equation that estimates kidney function   without a race variable.    eGFR  Date Value Ref Range Status  02/22/2023 98 >59 mL/min/1.73 Final   GFR calc non Af Amer  Date Value Ref Range Status  05/27/2020 93 >59 mL/min/1.73 Final

## 2023-08-29 ENCOUNTER — Ambulatory Visit: Payer: Self-pay | Admitting: Anesthesiology

## 2023-08-29 ENCOUNTER — Encounter
Admission: RE | Disposition: A | Discharge status: Home / Self Care | Payer: Self-pay | Source: Home / Self Care | Attending: Ophthalmology

## 2023-08-29 ENCOUNTER — Ambulatory Visit
Admission: RE | Admit: 2023-08-29 | Discharge: 2023-08-29 | Disposition: A | Discharge status: Home / Self Care | Attending: Ophthalmology | Admitting: Ophthalmology

## 2023-08-29 ENCOUNTER — Encounter: Payer: Self-pay | Admitting: Ophthalmology

## 2023-08-29 ENCOUNTER — Other Ambulatory Visit: Payer: Self-pay

## 2023-08-29 DIAGNOSIS — I739 Peripheral vascular disease, unspecified: Secondary | ICD-10-CM | POA: Diagnosis not present

## 2023-08-29 DIAGNOSIS — F419 Anxiety disorder, unspecified: Secondary | ICD-10-CM | POA: Diagnosis not present

## 2023-08-29 DIAGNOSIS — I10 Essential (primary) hypertension: Secondary | ICD-10-CM | POA: Insufficient documentation

## 2023-08-29 DIAGNOSIS — H2511 Age-related nuclear cataract, right eye: Secondary | ICD-10-CM | POA: Insufficient documentation

## 2023-08-29 DIAGNOSIS — I129 Hypertensive chronic kidney disease with stage 1 through stage 4 chronic kidney disease, or unspecified chronic kidney disease: Secondary | ICD-10-CM | POA: Diagnosis not present

## 2023-08-29 DIAGNOSIS — Z87891 Personal history of nicotine dependence: Secondary | ICD-10-CM | POA: Diagnosis not present

## 2023-08-29 DIAGNOSIS — F319 Bipolar disorder, unspecified: Secondary | ICD-10-CM | POA: Insufficient documentation

## 2023-08-29 DIAGNOSIS — N181 Chronic kidney disease, stage 1: Secondary | ICD-10-CM | POA: Diagnosis not present

## 2023-08-29 HISTORY — PX: CATARACT EXTRACTION W/PHACO: SHX586

## 2023-08-29 SURGERY — PHACOEMULSIFICATION, CATARACT, WITH IOL INSERTION
Anesthesia: Monitor Anesthesia Care | Laterality: Right

## 2023-08-29 MED ORDER — SIGHTPATH DOSE#1 BSS IO SOLN
INTRAOCULAR | Status: DC | PRN
  Administered 2023-08-29: 71 mL via OPHTHALMIC

## 2023-08-29 MED ORDER — MOXIFLOXACIN HCL 0.5 % OP SOLN
OPHTHALMIC | Status: DC | PRN
  Administered 2023-08-29: .2 mL via OPHTHALMIC

## 2023-08-29 MED ORDER — MIDAZOLAM HCL 2 MG/2ML IJ SOLN
INTRAMUSCULAR | Status: DC | PRN
  Administered 2023-08-29: 2 mg via INTRAVENOUS

## 2023-08-29 MED ORDER — FENTANYL CITRATE (PF) 100 MCG/2ML IJ SOLN
INTRAMUSCULAR | Status: AC
  Filled 2023-08-29: qty 2

## 2023-08-29 MED ORDER — ARMC OPHTHALMIC DILATING DROPS
1.0000 | OPHTHALMIC | Status: DC | PRN
  Administered 2023-08-29 (×3): 1 via OPHTHALMIC

## 2023-08-29 MED ORDER — FENTANYL CITRATE (PF) 100 MCG/2ML IJ SOLN
INTRAMUSCULAR | Status: DC | PRN
  Administered 2023-08-29 (×2): 50 ug via INTRAVENOUS

## 2023-08-29 MED ORDER — MIDAZOLAM HCL 2 MG/2ML IJ SOLN
INTRAMUSCULAR | Status: AC
  Filled 2023-08-29: qty 2

## 2023-08-29 MED ORDER — SIGHTPATH DOSE#1 NA HYALUR & NA CHOND-NA HYALUR IO KIT
PACK | INTRAOCULAR | Status: DC | PRN
  Administered 2023-08-29: 1 via OPHTHALMIC

## 2023-08-29 MED ORDER — LIDOCAINE HCL (PF) 2 % IJ SOLN
INTRAOCULAR | Status: DC | PRN
  Administered 2023-08-29: 4 mL via INTRAOCULAR

## 2023-08-29 MED ORDER — TETRACAINE HCL 0.5 % OP SOLN
OPHTHALMIC | Status: AC
  Filled 2023-08-29: qty 4

## 2023-08-29 MED ORDER — ARMC OPHTHALMIC DILATING DROPS
OPHTHALMIC | Status: AC
  Filled 2023-08-29: qty 0.5

## 2023-08-29 MED ORDER — SIGHTPATH DOSE#1 BSS IO SOLN
INTRAOCULAR | Status: DC | PRN
  Administered 2023-08-29: 15 mL via INTRAOCULAR

## 2023-08-29 MED ORDER — TETRACAINE HCL 0.5 % OP SOLN
1.0000 [drp] | OPHTHALMIC | Status: DC | PRN
  Administered 2023-08-29 (×3): 1 [drp] via OPHTHALMIC

## 2023-08-29 SURGICAL SUPPLY — 12 items
CATARACT SUITE SIGHTPATH (MISCELLANEOUS) ×1 IMPLANT
DISSECTOR HYDRO NUCLEUS 50X22 (MISCELLANEOUS) ×1 IMPLANT
FEE CATARACT SUITE SIGHTPATH (MISCELLANEOUS) ×1 IMPLANT
GLOVE PI ULTRA LF STRL 7.5 (GLOVE) ×1 IMPLANT
GLOVE SURG POLYISOPRENE 8.5 (GLOVE) ×1 IMPLANT
GLOVE SURG PROTEXIS BL SZ6.5 (GLOVE) ×1 IMPLANT
GLOVE SURG SYN 6.5 PF PI BL (GLOVE) ×1 IMPLANT
GLOVE SURG SYN 8.5 PF PI BL (GLOVE) ×1 IMPLANT
LENS IOL TECNIS EYHANCE 24.0 (Intraocular Lens) IMPLANT
NDL FILTER BLUNT 18X1 1/2 (NEEDLE) ×1 IMPLANT
NEEDLE FILTER BLUNT 18X1 1/2 (NEEDLE) ×1 IMPLANT
SYR 3ML LL SCALE MARK (SYRINGE) ×1 IMPLANT

## 2023-08-29 NOTE — Addendum Note (Signed)
 Addendum  created 08/29/23 1046 by Baltazar Bonier, MD   Attestation recorded in [place], Intraprocedure Attestations filed

## 2023-08-29 NOTE — Op Note (Signed)
 OPERATIVE NOTE  Jaime Freeman [id number] 08/29/2023   PREOPERATIVE DIAGNOSIS:  Nuclear sclerotic cataract right eye.  H25.11   POSTOPERATIVE DIAGNOSIS:    Nuclear sclerotic cataract right eye.     PROCEDURE:  Phacoemusification with posterior chamber intraocular lens placement of the right eye   LENS:   Implant Name Type Inv. Item Serial No. Manufacturer Lot No. LRB No. Used Action  LENS IOL TECNIS EYHANCE 24.0 - [id number] Intraocular Lens LENS IOL TECNIS EYHANCE 24.0 [id number] [employer]  Right 1 Implanted       Procedure(s): PHACOEMULSIFICATION, CATARACT, WITH IOL INSERTION 17.32, 01:18.2 (Right)  SURGEON:  Dusty Gin, MD, MPH  ANESTHESIOLOGIST: Anesthesiologist: Baltazar Bonier, MD CRNA: Adrien Horner, CRNA   ANESTHESIA:  Topical with tetracaine  drops augmented with 1% preservative-free intracameral lidocaine .  ESTIMATED BLOOD LOSS: less than 1 mL.   COMPLICATIONS:  None.   DESCRIPTION OF PROCEDURE:  The patient was identified in the holding room and transported to the operating room and placed in the supine position under the operating microscope.  The right eye was identified as the operative eye and it was prepped and draped in the usual sterile ophthalmic fashion.   A 1.0 millimeter clear-corneal paracentesis was made at the 10:30 position. 0.5 ml of preservative-free 1% lidocaine  with epinephrine  was injected into the anterior chamber.  The anterior chamber was filled with viscoelastic.  A 2.4 millimeter keratome was used to make a near-clear corneal incision at the 8:00 position.  A curvilinear capsulorrhexis was made with a cystotome and capsulorrhexis forceps.  Balanced salt  solution was used to hydrodissect and hydrodelineate the nucleus.   Phacoemulsification was then used in stop and chop fashion to remove the lens nucleus and epinucleus.  The remaining cortex was then removed using the irrigation and aspiration handpiece. Viscoelastic was then placed into  the capsular bag to distend it for lens placement.  A lens was then injected into the capsular bag.  The remaining viscoelastic was aspirated.   Wounds were hydrated with balanced salt  solution.  The anterior chamber was inflated to a physiologic pressure with balanced salt  solution.   Intracameral vigamox  0.1 mL undiluted was injected into the eye and a drop placed onto the ocular surface.  No wound leaks were noted.  The patient was taken to the recovery room in stable condition without complications of anesthesia or surgery  Dusty Gin 08/29/2023, 9:45 AM

## 2023-08-29 NOTE — Transfer of Care (Signed)
 Immediate Anesthesia Transfer of Care Note  Patient: Jaime Freeman  Procedure(s) Performed: PHACOEMULSIFICATION, CATARACT, WITH IOL INSERTION 17.32, 01:18.2 (Right)  Patient Location: PACU  Anesthesia Type: MAC  Level of Consciousness: awake, alert  and patient cooperative  Airway and Oxygen Therapy: Patient Spontanous Breathing and Patient connected to supplemental oxygen  Post-op Assessment: Post-op Vital signs reviewed, Patient's Cardiovascular Status Stable, Respiratory Function Stable, Patent Airway and No signs of Nausea or vomiting  Post-op Vital Signs: Reviewed and stable  Complications: No notable events documented.

## 2023-08-29 NOTE — Anesthesia Postprocedure Evaluation (Signed)
 Anesthesia Post Note  Patient: Lady Pier  Procedure(s) Performed: PHACOEMULSIFICATION, CATARACT, WITH IOL INSERTION 17.32, 01:18.2 (Right)  Patient location during evaluation: PACU Anesthesia Type: MAC Level of consciousness: awake and alert Pain management: pain level controlled Vital Signs Assessment: post-procedure vital signs reviewed and stable Respiratory status: spontaneous breathing, nonlabored ventilation and respiratory function stable Cardiovascular status: blood pressure returned to baseline and stable Postop Assessment: no apparent nausea or vomiting Anesthetic complications: no   No notable events documented.   Last Vitals:  Vitals:   08/29/23 0950 08/29/23 0953  BP:  119/79  Pulse: 69 65  Resp: 20 15  Temp:  (!) 36.3 C  SpO2: 99% 100%    Last Pain:  Vitals:   08/29/23 0953  TempSrc:   PainSc: 0-No pain                 Baltazar Bonier

## 2023-08-29 NOTE — H&P (Signed)
 [facility]   Primary Care Physician:  Solomon Dupre, DO Ophthalmologist: Dr. Dusty Gin  Pre-Procedure History & Physical: HPI:  Jaime Freeman is a 69 y.o. female here for cataract surgery.   Past Medical History:  Diagnosis Date   Allergic rhinitis    Angio-edema    Anxiety    Bipolar affective disorder (HCC)    Depression    Hiatal hernia    Hypertension    IFG (impaired fasting glucose)    Lithium  toxicity 11/10/2017   Menopausal state    Morbid obesity (HCC)    Neuropathy of both feet    OCD (obsessive compulsive disorder)    Panic disorder    Restrictive lung disease    Urticaria    Vertigo    Vitamin B12 deficiency    Vitamin D  deficiency disease     Past Surgical History:  Procedure Laterality Date   CATARACT EXTRACTION W/PHACO Left 08/15/2023   Procedure: PHACOEMULSIFICATION, CATARACT, WITH IOL INSERTION 11.53 00:57.5;  Surgeon: Rosa College, MD;  Location[facility];  Service: Ophthalmology;  Laterality: Left;   CESAREAN SECTION     DILATION AND CURETTAGE OF UTERUS     TONSILLECTOMY      Prior to Admission medications   Medication Sig Start Date End Date Taking? Authorizing Provider  albuterol  (PROVENTIL ) (2.5 MG/3ML) 0.083% nebulizer solution Take 3 mLs (2.5 mg total) by nebulization every 6 (six) hours as needed for wheezing or shortness of breath. 08/23/22  Yes Johnson, Megan P, DO  albuterol  (VENTOLIN  HFA) 108 (90 Base) MCG/ACT inhaler Inhale 2 puffs into the lungs every 4 (four) hours as needed for wheezing or shortness of breath. 08/23/22  Yes Johnson, Megan P, DO  Ascorbic Acid (VITAMIN C PO) Take by mouth.   Yes [provider]  aspirin  EC 81 MG tablet Take 81 mg by mouth daily.   Yes [provider]  benazepril  (LOTENSIN ) 20 MG tablet TAKE ONE TABLET BY MOUTH ONE TIME DAILY 05/26/23  Yes Johnson, Megan P, DO  bisacodyl  (DULCOLAX) 5 MG EC tablet Take 5 mg by mouth daily as needed for moderate constipation.    Yes [provider]  clonazePAM  (KLONOPIN ) 0.5 MG tablet Take 1 tablet (0.5 mg total) by mouth 2 (two) times daily as needed for anxiety. 10/14/22  Yes Johnson, Megan P, DO  etodolac  (LODINE ) 400 MG tablet TAKE ONE TABLET BY MOUTH TWICE A DAY AS NEEDED 02/24/23  Yes Johnson, Megan P, DO  fluticasone  (FLONASE ) 50 MCG/ACT nasal spray Place 2 sprays into both nostrils daily. 08/23/22  Yes Johnson, Megan P, DO  loratadine (CLARITIN) 10 MG tablet Take 10 mg by mouth daily as needed for allergies.   Yes [provider]  Multiple Vitamin (MULTI VITAMIN PO) Take by mouth.   Yes [provider]  omeprazole  (PRILOSEC) 20 MG capsule Take 1 capsule (20 mg total) by mouth daily. 05/31/23  Yes Johnson, Megan P, DO  QUEtiapine  (SEROQUEL ) 25 MG tablet TAKE ONE TO TWO TABLETS BY MOUTH ONE TIME DAILY AT BEDTIME; MAY TAKE ADDITIONAL ONE-HALF TALBLET UP TO THREE TIMES A DAY AS NEEDED FOR ANXIETY 08/25/23  Yes Johnson, Megan P, DO  diclofenac  Sodium (VOLTAREN ) 1 % GEL Apply 4 g topically 4 (four) times daily. Patient not taking: Reported on 08/08/2023 08/31/22   Terre Ferri P, DO  metroNIDAZOLE  (FLAGYL ) 500 MG tablet Take 1 tablet (500 mg total) by mouth 2 (two) times daily. Patient not taking: Reported on 08/08/2023  03/01/23   Johnson, Megan P, DO  mupirocin  ointment (BACTROBAN ) 2 % Apply 1 Application topically 2 (two) times daily. 02/03/22   Mecum, Erin E, PA-C  nystatin  ointment (MYCOSTATIN ) Apply 1 Application topically 2 (two) times daily. 12/08/21   Johnson, Megan P, DO  Olopatadine-Mometasone (RYALTRIS ) 665-25 MCG/ACT SUSP Place 2 sprays into the nose daily as needed. 02/15/23   Pearley, Basilia Lima, NP  senna (SENOKOT) 8.6 MG tablet Take 1 tablet by mouth daily. Take 2 tablets at night    [provider]    Allergies as of 07/15/2023 - Review Complete 04/25/2023  Allergen Reaction Noted   Codeine Other (See Comments) 12/25/2014   Dynacirc [isradipine]  11/14/2014    Guaifenesin & derivatives  11/14/2014   Influenza vaccines  12/08/2021   Other Other (See Comments) 09/05/2012   Sudafed [pseudoephedrine]  11/14/2014   Zithromax [azithromycin]  11/14/2014   Prednisone Anxiety 11/22/2016    Family History  Problem Relation Age of Onset   Heart disease Mother    Heart attack Mother    Hypertension Mother    Cancer Father        GI tract   Hypertension Father    Cancer Maternal Grandmother        gallbladder   Heart disease Maternal Grandfather    COPD Neg Hx    Diabetes Neg Hx    Stroke Neg Hx     Social History   Socioeconomic History   Marital status: Widowed    Spouse name: Not on file   Number of children: 2   Years of education: Not on file   Highest education level: Not on file  Occupational History   Occupation: retired  Tobacco Use   Smoking status: Former    Current packs/day: 0.00    Average packs/day: 0.5 packs/day for 17.0 years (8.5 ttl pk-yrs)    Types: Cigarettes    Start date: 41    Quit date: 04/27/1987    Years since quitting: 36.3   Smokeless tobacco: Never  Vaping Use   Vaping status: Never Used  Substance and Sexual Activity   Alcohol  use: No   Drug use: No   Sexual activity: Not Currently  Other Topics Concern   Not on file  Social History Narrative   Not on file   Social Drivers of Health   Financial Resource Strain: Low Risk  (01/04/2023)   Overall Financial Resource Strain (CARDIA)    Difficulty of Paying Living Expenses: Not hard at all  Food Insecurity: No Food Insecurity (01/04/2023)   Hunger Vital Sign    Worried About Running Out of Food in the Last Year: Never true    Ran Out of Food in the Last Year: Never true  Transportation Needs: No Transportation Needs (01/04/2023)   PRAPARE - Administrator, Civil Service (Medical): No    Lack of Transportation (Non-Medical): No  Physical Activity: Inactive (01/04/2023)   Exercise Vital Sign    Days of Exercise per Week: 0 days     Minutes of Exercise per Session: 0 min  Stress: No Stress Concern Present (01/04/2023)   [employer] of Occupational Health - Occupational Stress Questionnaire    Feeling of Stress : Not at all  Social Connections: Moderately Isolated (01/04/2023)   Social Connection and Isolation Panel [NHANES]    Frequency of Communication with Friends and Family: More than three times a week    Frequency of Social Gatherings with Friends and Family: More  than three times a week    Attends Religious Services: More than 4 times per year    Active Member of Clubs or Organizations: No    Attends Banker Meetings: Never    Marital Status: Widowed  Intimate Partner Violence: Not At Risk (01/04/2023)   Humiliation, Afraid, Rape, and Kick questionnaire    Fear of Current or Ex-Partner: No    Emotionally Abused: No    Physically Abused: No    Sexually Abused: No    Review of Systems: See HPI, otherwise negative ROS  Physical Exam: BP (!) 145/65   Pulse 75   Temp (!) 97.2 F (36.2 C) (Temporal)   Resp 20   Ht 5\' 1"  (1.549 m)   Wt 88.9 kg   SpO2 100%   BMI 37.03 kg/m  General:   Alert, cooperative. Head:  Normocephalic and atraumatic. Respiratory:  Normal work of breathing. Cardiovascular:  NAD  Impression/Plan: Lady Pier is here for cataract surgery.  Risks, benefits, limitations, and alternatives regarding cataract surgery have been reviewed with the patient.  Questions have been answered.  All parties agreeable.   Dusty Gin, MD  08/29/2023, 9:14 AM

## 2023-09-05 ENCOUNTER — Other Ambulatory Visit: Payer: Self-pay | Admitting: Family Medicine

## 2023-09-05 NOTE — Telephone Encounter (Signed)
 Patient calling and requesting rx to be sent in today if possible. Patient is attempting to make one trip to pharmacy as she lives in [place] and has to have transportation to pharmacy. Patient advised of turnaround time.

## 2023-09-08 ENCOUNTER — Telehealth: Payer: Self-pay

## 2023-09-08 NOTE — Telephone Encounter (Signed)
 Copied from CRM [id number]. Topic: Clinical - Medication Question >> Sep 07, 2023  4:09 PM Jaime Freeman wrote: Pt needs assistance finding [facility] QUEtiapine  (SEROQUEL ) 25 MG tablet in a pharmacy. Her pharmacy does not carry it anymore and she is having a hard time finding it after calling around. She has enough for about 3 weeks however she's does not want to change brands as its really been working for her.

## 2023-09-12 ENCOUNTER — Other Ambulatory Visit: Payer: Self-pay

## 2023-09-12 DIAGNOSIS — F332 Major depressive disorder, recurrent severe without psychotic features: Secondary | ICD-10-CM

## 2023-09-12 NOTE — Telephone Encounter (Signed)
 Patient is overdue for follow up. I'm happy to send her a refill when she has an appointment scheduled

## 2023-09-12 NOTE — Telephone Encounter (Signed)
 Copied from CRM [id number]. Topic: Clinical - Prescription Issue >> Sep 09, 2023  2:05 PM Baldemar Lev wrote: Reason for CRM: Pt called [employer] today and called to share the information that she gathered.   Requesting 90 day supply mail order since they cannot find this Rx anywhere locally.   Please contact [phone] [facility]  QUEtiapine  (SEROQUEL ) 25 MG tablet [employer] [place] is the manufacturer.   There are 2 different colors - she uses the Light Peach color NOT the red wine color.   Pt says please contact her if anyone has any questions

## 2023-09-13 MED ORDER — QUETIAPINE FUMARATE 25 MG PO TABS: ORAL_TABLET | ORAL | 0 refills | Status: DC

## 2023-09-13 NOTE — Telephone Encounter (Signed)
 Appt scheduled 10/19/2023 at 10:40 AM

## 2023-09-13 NOTE — Telephone Encounter (Signed)
 Per patient sent to her mail order pharmacy with specific directions she provided.

## 2023-09-20 ENCOUNTER — Other Ambulatory Visit: Payer: Self-pay

## 2023-09-20 ENCOUNTER — Other Ambulatory Visit: Payer: Self-pay | Admitting: Family Medicine

## 2023-09-20 DIAGNOSIS — F332 Major depressive disorder, recurrent severe without psychotic features: Secondary | ICD-10-CM

## 2023-09-20 MED ORDER — QUETIAPINE FUMARATE 25 MG PO TABS
25.0000 mg | ORAL_TABLET | Freq: Every day | ORAL | 0 refills | Status: DC
  Filled 2023-09-20 – 2023-09-22 (×2): qty 225, 64d supply, fill #0
  Filled 2023-09-23: qty 225, 90d supply, fill #0
  Filled 2023-09-27: qty 225, 65d supply, fill #0

## 2023-09-20 NOTE — Telephone Encounter (Signed)
 Ok for E2C2 to review.  Please advise patient that I have tried to return her call however her voicemail is full. We are unable to call around to pharmacies to try and find where it might be in stock. I suggest her reaching out to the company directly and seeing if they may be able to provide her more information but we are unable to make numerous call hoping to find it in stock somewhere.

## 2023-09-20 NOTE — Telephone Encounter (Signed)
 Will send Rx to [facility] pharmacy. They may be able to help. Agree with everything stated below.

## 2023-09-20 NOTE — Telephone Encounter (Signed)
 Pt states was told that the mail order pharmacy can't get syriquil for her and she is asking for the info about Blueprint that she sent to Dr Lincoln Renshaw

## 2023-09-21 ENCOUNTER — Telehealth: Payer: Self-pay | Admitting: Family Medicine

## 2023-09-21 ENCOUNTER — Other Ambulatory Visit: Payer: Self-pay

## 2023-09-21 NOTE — Telephone Encounter (Unsigned)
 Copied from CRM [id number]. Topic: Clinical - Medication Refill >> Sep 21, 2023  4:31 PM Felizardo Hotter wrote: Medication: QUEtiapine  (SEROQUEL ) 25 MG tablet  Has the patient contacted their pharmacy? Yes (Agent: If no, request that the patient contact the pharmacy for the refill. If patient does not wish to contact the pharmacy document the reason why and proceed with request.) (Agent: If yes, when and what did the pharmacy advise?)  This is the patient's preferred pharmacy:   [facility] [address] [place] [address] Phone: [phone] Fax: [phone]  Is this the correct pharmacy for this prescription? Yes If no, delete pharmacy and type the correct one.   Has the prescription been filled recently? Yes  Is the patient out of the medication? Yes  Has the patient been seen for an appointment in the last year OR does the patient have an upcoming appointment? Yes  Can we respond through MyChart? Yes  Agent: Please be advised that Rx refills may take up to 3 business days. We ask that you follow-up with your pharmacy.

## 2023-09-22 ENCOUNTER — Other Ambulatory Visit (HOSPITAL_COMMUNITY): Payer: Self-pay

## 2023-09-22 ENCOUNTER — Other Ambulatory Visit (HOSPITAL_BASED_OUTPATIENT_CLINIC_OR_DEPARTMENT_OTHER): Payer: Self-pay

## 2023-09-22 ENCOUNTER — Other Ambulatory Visit: Payer: Self-pay

## 2023-09-22 DIAGNOSIS — Z0100 Encounter for examination of eyes and vision without abnormal findings: Secondary | ICD-10-CM | POA: Diagnosis not present

## 2023-09-22 NOTE — Telephone Encounter (Signed)
 Spoke with Jaime Freeman at [facility] pharmacy. She is going to ensure it can be received for that specifics and will call back ASAP.

## 2023-09-22 NOTE — Telephone Encounter (Signed)
 Copied from CRM [id number]. Topic: Clinical - Prescription Issue >> Sep 21, 2023  4:09 PM Sasha H wrote: Reason for CRM: pt states the QUEtiapine  (SEROQUEL ) 25 MG tablet has to be BLUE POINT and it has to be at [facility] [address] [place] [address] , she would like a call back that it was called in to this pharmacy. Pt states she called and verified that they carry this. Pt states she was getting a 90 day supply when getting it filled at [facility] and is wondering if it can be a 90 day supply again.

## 2023-09-22 NOTE — Telephone Encounter (Signed)
 Copied from CRM [id number]. Topic: Clinical - Prescription Issue >> Sep 09, 2023 10:28 AM Sophia H wrote: Reason for CRM: Pt needs assistance finding [facility] QUEtiapine  (SEROQUEL ) 25 MG tablet in a pharmacy, 3rd time calling in. Doesn't want to stop taking this medication as she feels it's the only thing that has worked for her. Advs she should allow more time for clinic to see what can be done but pt is very adamant about getting this taken care of asap.  Best contact # [phone]   Also advs patient to try and get with her insurance and see if they have a pharmacy that they use that she is able to get it from. >> Sep 16, 2023 11:14 AM Crispin Dolphin wrote: Patient called back. States she received a call from [facility] today requesting that Rx be sent to them for QUEtiapine  (SEROQUEL ) 25 MG tablet. Let her know it shows it was sent to [facility] 5/20. Patient states they haven't received it or are having a issue with filling it and need additional information from provider. Patient states she is keeping her appt and that she is not requesting it but she is letting the provider know that [facility] contacted her about it. Thank You

## 2023-09-22 NOTE — Telephone Encounter (Signed)
 Per chart review, medication was refilled 5/27 but there is no receipt confirmation from the pharmacy [facility]). RN called and spoke to a pharmacist. Pharmacist states that they are waiting on authorization from the pt's PCP to see if [employer] or [employer] manufacturers are suitable.

## 2023-09-23 ENCOUNTER — Other Ambulatory Visit (HOSPITAL_COMMUNITY): Payer: Self-pay

## 2023-09-23 NOTE — Telephone Encounter (Signed)
 Call to [facility] pharmacy, reviewed exact medication patient wants and they are speaking with the person who does ordering today to ensure it can be received.

## 2023-09-26 ENCOUNTER — Other Ambulatory Visit (HOSPITAL_COMMUNITY): Payer: Self-pay

## 2023-09-27 ENCOUNTER — Other Ambulatory Visit (HOSPITAL_COMMUNITY): Payer: Self-pay

## 2023-09-27 ENCOUNTER — Other Ambulatory Visit: Payer: Self-pay

## 2023-09-28 ENCOUNTER — Other Ambulatory Visit: Payer: Self-pay

## 2023-09-28 ENCOUNTER — Other Ambulatory Visit (HOSPITAL_COMMUNITY): Payer: Self-pay

## 2023-09-29 ENCOUNTER — Other Ambulatory Visit: Payer: Self-pay

## 2023-09-29 ENCOUNTER — Other Ambulatory Visit (HOSPITAL_COMMUNITY): Payer: Self-pay

## 2023-09-30 ENCOUNTER — Other Ambulatory Visit: Payer: Self-pay

## 2023-10-19 ENCOUNTER — Ambulatory Visit: Admitting: Family Medicine

## 2023-10-28 DIAGNOSIS — H524 Presbyopia: Secondary | ICD-10-CM | POA: Diagnosis not present

## 2023-11-20 ENCOUNTER — Other Ambulatory Visit: Payer: Self-pay | Admitting: Family Medicine

## 2023-11-22 NOTE — Telephone Encounter (Signed)
 Requested Prescriptions  Pending Prescriptions Disp Refills   benazepril  (LOTENSIN ) 20 MG tablet [Pharmacy Med Name: BENAZEPRIL  20 MG TAB[*]] 90 tablet 0    Sig: TAKE ONE TABLET BY MOUTH ONE TIME DAILY     Cardiovascular:  ACE Inhibitors Failed - 11/22/2023 11:02 AM      Failed - Cr in normal range and within 180 days    Creatinine, Ser  Date Value Ref Range Status  02/22/2023 0.62 0.57 - 1.00 mg/dL Final         Failed - K in normal range and within 180 days    Potassium  Date Value Ref Range Status  02/22/2023 5.1 3.5 - 5.2 mmol/L Final         Failed - Valid encounter within last 6 months    Recent Outpatient Visits   None            Passed - Patient is not pregnant      Passed - Last BP in normal range    BP Readings from Last 1 Encounters:  08/29/23 119/79

## 2023-11-23 ENCOUNTER — Ambulatory Visit: Admitting: Family Medicine

## 2024-01-09 ENCOUNTER — Telehealth: Payer: Self-pay | Admitting: Family Medicine

## 2024-01-09 NOTE — Telephone Encounter (Signed)
 Copied from CRM #[id number]. Topic: Medicare AWV >> Jan 09, 2024 10:20 AM Nathanel DEL wrote: Reason for CRM: Called 01/09/2024 to confirm AWV 01/10/2024, no vm   Nathanel Paschal; Care Guide Ambulatory Clinical Support [place] [facility] Direct Dial: [phone]

## 2024-01-10 ENCOUNTER — Ambulatory Visit (INDEPENDENT_AMBULATORY_CARE_PROVIDER_SITE_OTHER): Payer: Self-pay | Admitting: Emergency Medicine

## 2024-01-10 VITALS — Ht 62.0 in | Wt 203.0 lb

## 2024-01-10 DIAGNOSIS — Z0000 Encounter for general adult medical examination without abnormal findings: Secondary | ICD-10-CM

## 2024-01-10 DIAGNOSIS — Z780 Asymptomatic menopausal state: Secondary | ICD-10-CM

## 2024-01-10 DIAGNOSIS — Z1231 Encounter for screening mammogram for malignant neoplasm of breast: Secondary | ICD-10-CM

## 2024-01-10 NOTE — Patient Instructions (Signed)
 Jaime Freeman,  Thank you for taking the time for your Medicare Wellness Visit. I appreciate your continued commitment to your health goals. Please review the care plan we discussed, and feel free to reach out if I can assist you further.  Medicare recommends these wellness visits once per year to help you and your care team stay ahead of potential health issues. These visits are designed to focus on prevention, allowing your provider to concentrate on managing your acute and chronic conditions during your regular appointments.  Please note that Annual Wellness Visits do not include a physical exam. Some assessments may be limited, especially if the visit was conducted virtually. If needed, we may recommend a separate in-person follow-up with your provider.  Ongoing Care Seeing your primary care provider every 3 to 6 months helps us  monitor your health and provide consistent, personalized care.   Referrals If a referral was made during today's visit and you haven't received any updates within two weeks, please contact the referred provider directly to check on the status.  Recommended Screenings:   I have made you an appointment with Dr. Vicci for 02/13/24 @ 10:40am.  Please call to schedule your mammogram and bone density scan:  [facility] [place] Address: [address], [place], [place] Phone: [phone]  [facility] [address] [place], [place] [address] Phone: [phone]   Health Maintenance  Topic Date Due   Breast Cancer Screening  Never done   Zoster (Shingles) Vaccine (1 of 2) Never done   DEXA scan (bone density measurement)  Never done   Flu Shot  07/24/2024*   Cologuard (Stool DNA test)  12/14/2024   Medicare Annual Wellness Visit  01/09/2025   DTaP/Tdap/Td vaccine (2 - Td or Tdap) 07/01/2030   Pneumococcal Vaccine for age over 66  Completed   Hepatitis C Screening  Completed   HPV  Vaccine  Aged Out   Meningitis B Vaccine  Aged Out   COVID-19 Vaccine  Discontinued  *Topic was postponed. The date shown is not the original due date.       01/10/2024    2:45 PM  Advanced Directives  Does Patient Have a Medical Advance Directive? No  Would patient like information on creating a medical advance directive? Yes (MAU/Ambulatory/Procedural Areas - Information given)   Advance Care Planning is important because it: Ensures you receive medical care that aligns with your values, goals, and preferences. Provides guidance to your family and loved ones, reducing the emotional burden of decision-making during critical moments.  Vision: Annual vision screenings are recommended for early detection of glaucoma, cataracts, and diabetic retinopathy. These exams can also reveal signs of chronic conditions such as diabetes and high blood pressure.  Dental: Annual dental screenings help detect early signs of oral cancer, gum disease, and other conditions linked to overall health, including heart disease and diabetes.  Please see the attached documents for additional preventive care recommendations.    Fall Prevention in the Home, Adult Falls can cause injuries and affect people of all ages. There are many simple things that you can do to make your home safe and to help prevent falls. If you need it, ask for help making these changes. What actions can I take to prevent falls? General information Use good lighting in all rooms. Make sure to: Replace any light bulbs that burn out. Turn on lights if it is dark and use night-lights. Keep items that you  use often in easy-to-reach places. Lower the shelves around your home if needed. Move furniture so that there are clear paths around it. Do not keep throw rugs or other things on the floor that can make you trip. If any of your floors are uneven, fix them. Add color or contrast paint or tape to clearly mark and help you see: Grab bars or  handrails. First and last steps of staircases. Where the edge of each step is. If you use a ladder or stepladder: Make sure that it is fully opened. Do not climb a closed ladder. Make sure the sides of the ladder are locked in place. Have someone hold the ladder while you use it. Know where your pets are as you move through your home. What can I do in the bathroom?     Keep the floor dry. Clean up any water that is on the floor right away. Remove soap buildup in the bathtub or shower. Buildup makes bathtubs and showers slippery. Use non-skid mats or decals on the floor of the bathtub or shower. Attach bath mats securely with double-sided, non-slip rug tape. If you need to sit down while you are in the shower, use a non-slip stool. Install grab bars by the toilet and in the bathtub and shower. Do not use towel bars as grab bars. What can I do in the bedroom? Make sure that you have a light by your bed that is easy to reach. Do not use any sheets or blankets on your bed that hang to the floor. Have a firm bench or chair with side arms that you can use for support when you get dressed. What can I do in the kitchen? Clean up any spills right away. If you need to reach something above you, use a sturdy step stool that has a grab bar. Keep electrical cables out of the way. Do not use floor polish or wax that makes floors slippery. What can I do with my stairs? Do not leave anything on the stairs. Make sure that you have a light switch at the top and the bottom of the stairs. Have them installed if you do not have them. Make sure that there are handrails on both sides of the stairs. Fix handrails that are broken or loose. Make sure that handrails are as long as the staircases. Install non-slip stair treads on all stairs in your home if they do not have carpet. Avoid having throw rugs at the top or bottom of stairs, or secure the rugs with carpet tape to prevent them from moving. Choose a  carpet design that does not hide the edge of steps on the stairs. Make sure that carpet is firmly attached to the stairs. Fix any carpet that is loose or worn. What can I do on the outside of my home? Use bright outdoor lighting. Repair the edges of walkways and driveways and fix any cracks. Clear paths of anything that can make you trip, such as tools or rocks. Add color or contrast paint or tape to clearly mark and help you see high doorway thresholds. Trim any bushes or trees on the main path into your home. Check that handrails are securely fastened and in good repair. Both sides of all steps should have handrails. Install guardrails along the edges of any raised decks or porches. Have leaves, snow, and ice cleared regularly. Use sand, salt, or ice melt on walkways during winter months if you live where there is ice and snow.  In the garage, clean up any spills right away, including grease or oil spills. What other actions can I take? Review your medicines with your health care provider. Some medicines can make you confused or feel dizzy. This can increase your chance of falling. Wear closed-toe shoes that fit well and support your feet. Wear shoes that have rubber soles and low heels. Use a cane, walker, scooter, or crutches that help you move around if needed. Talk with your provider about other ways that you can decrease your risk of falls. This may include seeing a physical therapist to learn to do exercises to improve movement and strength. Where to find more information Centers for Disease Control and Prevention, [employer]: TonerPromos.no [employer] on Aging: BaseRingTones.pl National Institute on Aging: BaseRingTones.pl Contact a health care provider if: You are afraid of falling at home. You feel weak, drowsy, or dizzy at home. You fall at home. Get help right away if you: Lose consciousness or have trouble moving after a fall. Have a fall that causes a head injury. These symptoms may be an  emergency. Get help right away. Call 911. Do not wait to see if the symptoms will go away. Do not drive yourself to the hospital. This information is not intended to replace advice given to you by your health care provider. Make sure you discuss any questions you have with your health care provider. Document Revised: 12/14/2021 Document Reviewed: 12/14/2021 Elsevier Patient Education  2024 [employer].

## 2024-01-10 NOTE — Progress Notes (Signed)
 Subjective:   Jaime Freeman is a 68 y.o. who presents for a Medicare Wellness preventive visit.  As a reminder, Annual Wellness Visits don't include a physical exam, and some assessments may be limited, especially if this visit is performed virtually. We may recommend an in-person follow-up visit with your provider if needed.  Visit Complete: Virtual I connected with  Jaime Freeman on 01/10/24 by a audio enabled telemedicine application and verified that I am speaking with the correct person using two identifiers.  Patient Location: Home  Provider Location: Home Office  I discussed the limitations of evaluation and management by telemedicine. The patient expressed understanding and agreed to proceed.  Vital Signs: Because this visit was a virtual/telehealth visit, some criteria may be missing or patient reported. Any vitals not documented were not able to be obtained and vitals that have been documented are patient reported.  VideoDeclined- This patient declined Librarian, academic. Therefore the visit was completed with audio only.  Persons Participating in Visit: Patient.  AWV Questionnaire: No: Patient Medicare AWV questionnaire was not completed prior to this visit.  Cardiac Risk Factors include: advanced age (>34men, >67 women);dyslipidemia;obesity (BMI >30kg/m2);sedentary lifestyle     Objective:    Today's Vitals   01/10/24 1426 01/10/24 1427  Weight: 203 lb (92.1 kg)   Height: 5' 2 (1.575 m)   PainSc:  3    Body mass index is 37.13 kg/m.     01/10/2024    2:45 PM 08/29/2023    8:12 AM 08/15/2023    7:58 AM 01/04/2023    3:16 PM 11/06/2022    2:15 PM 12/07/2017    7:01 PM 11/10/2017    8:39 PM  Advanced Directives  Does Patient Have a Medical Advance Directive? No Yes Yes No No No  Yes   Type of Special educational needs teacher of [place];Living will Healthcare Power of [place];Living will    Healthcare Power of Attorney  Does  patient want to make changes to medical advance directive?  No - Patient declined     No - Patient declined   Copy of Healthcare Power of Attorney in Chart?  No - copy requested     No - copy requested   Would patient like information on creating a medical advance directive? Yes (MAU/Ambulatory/Procedural Areas - Information given)   Yes (MAU/Ambulatory/Procedural Areas - Information given)        Data saved with a previous flowsheet row definition    Current Medications (verified) Outpatient Encounter Medications as of 01/10/2024  Medication Sig   albuterol  (PROVENTIL ) (2.5 MG/3ML) 0.083% nebulizer solution Take 3 mLs (2.5 mg total) by nebulization every 6 (six) hours as needed for wheezing or shortness of breath.   albuterol  (VENTOLIN  HFA) 108 (90 Base) MCG/ACT inhaler Inhale 2 puffs into the lungs every 4 (four) hours as needed for wheezing or shortness of breath.   Ascorbic Acid (VITAMIN C PO) Take by mouth.   aspirin  EC 81 MG tablet Take 81 mg by mouth daily.   benazepril  (LOTENSIN ) 20 MG tablet TAKE ONE TABLET BY MOUTH ONE TIME DAILY   bisacodyl  (DULCOLAX) 5 MG EC tablet Take 5 mg by mouth daily as needed for moderate constipation.   clonazePAM  (KLONOPIN ) 0.5 MG tablet Take 1 tablet (0.5 mg total) by mouth 2 (two) times daily as needed for anxiety.   etodolac  (LODINE ) 400 MG tablet TAKE ONE TABLET BY MOUTH TWICE A DAY AS NEEDED   fluticasone  (FLONASE ) 50  MCG/ACT nasal spray USE TWO SPRAYS IN EACH NOSTRIL ONE TIME DAILY   loratadine (CLARITIN) 10 MG tablet Take 10 mg by mouth daily as needed for allergies.   Multiple Vitamin (MULTI VITAMIN PO) Take by mouth.   omeprazole  (PRILOSEC) 20 MG capsule Take 1 capsule (20 mg total) by mouth daily.   QUEtiapine  (SEROQUEL ) 25 MG tablet Take 1-2 tablets (25-50 mg total) by mouth at bedtime. May take additional 1/2 tablet up to three times a day as needed for anxiety.   senna (SENOKOT) 8.6 MG tablet Take 1 tablet by mouth daily. Take 2 tablets at  night   diclofenac  Sodium (VOLTAREN ) 1 % GEL Apply 4 g topically 4 (four) times daily. (Patient not taking: Reported on 01/10/2024)   metroNIDAZOLE  (FLAGYL ) 500 MG tablet Take 1 tablet (500 mg total) by mouth 2 (two) times daily. (Patient not taking: Reported on 01/10/2024)   mupirocin  ointment (BACTROBAN ) 2 % Apply 1 Application topically 2 (two) times daily. (Patient not taking: Reported on 01/10/2024)   nystatin  ointment (MYCOSTATIN ) Apply 1 Application topically 2 (two) times daily. (Patient not taking: Reported on 01/10/2024)   Olopatadine-Mometasone (RYALTRIS ) 665-25 MCG/ACT SUSP Place 2 sprays into the nose daily as needed. 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   Menopausal state    Morbid obesity (HCC)    Neuropathy of both feet    OCD (obsessive compulsive disorder)    Panic disorder    Restrictive lung disease    Urticaria    Vertigo    Vitamin B12 deficiency    Vitamin D  deficiency disease    Past Surgical History:  Procedure Laterality Date   CATARACT EXTRACTION W/PHACO Left 08/15/2023   Procedure: PHACOEMULSIFICATION, CATARACT, WITH IOL INSERTION 11.53 00:57.5;  Surgeon: Myrna Adine Anes, MD;  Location: [facility];  Service: Ophthalmology;  Laterality: Left;   CATARACT EXTRACTION W/PHACO Right 08/29/2023   Procedure: PHACOEMULSIFICATION, CATARACT, WITH IOL INSERTION 17.32, 01:18.2;  Surgeon:  Myrna Adine Anes, MD;  Location: [facility];  Service: Ophthalmology;  Laterality: Right;   CESAREAN SECTION     DILATION AND CURETTAGE OF UTERUS     TONSILLECTOMY     Family History  Problem Relation Age of Onset   Heart disease Mother    Heart attack Mother    Hypertension Mother    Cancer Father        GI tract   Hypertension Father    Cancer Maternal Grandmother        gallbladder   Heart disease Maternal Grandfather    COPD Neg Hx    Diabetes Neg Hx    Stroke Neg Hx    Social History   Socioeconomic History   Marital status: Widowed    Spouse name: Not on file   Number of children: 2   Years of education: Not on file   Highest education level: Not on file  Occupational History   Occupation: retired  Tobacco Use  Smoking status: Former    Current packs/day: 0.00    Average packs/day: 0.5 packs/day for 17.0 years (8.5 ttl pk-yrs)    Types: Cigarettes    Start date: 28    Quit date: 04/27/1987    Years since quitting: 36.7   Smokeless tobacco: Never  Vaping Use   Vaping status: Never Used  Substance and Sexual Activity   Alcohol  use: No   Drug use: No   Sexual activity: Not Currently  Other Topics Concern   Not on file  Social History Narrative   Not on file   Social Drivers of Health   Financial Resource Strain: Low Risk  (01/10/2024)   Overall Financial Resource Strain (CARDIA)    Difficulty of Paying Living Expenses: Not hard at all  Food Insecurity: No Food Insecurity (01/10/2024)   Hunger Vital Sign    Worried About Running Out of Food in the Last Year: Never true    Ran Out of Food in the Last Year: Never true  Transportation Needs: No Transportation Needs (01/10/2024)   PRAPARE - Administrator, Civil Service (Medical): No    Lack of Transportation (Non-Medical): No  Physical Activity: Inactive (01/10/2024)   Exercise Vital Sign    Days of Exercise per Week: 0 days    Minutes of Exercise per Session: 0 min  Stress: No  Stress Concern Present (01/10/2024)   [employer] of Occupational Health - Occupational Stress Questionnaire    Feeling of Stress: Only a little  Social Connections: Moderately Isolated (01/10/2024)   Social Connection and Isolation Panel    Frequency of Communication with Friends and Family: More than three times a week    Frequency of Social Gatherings with Friends and Family: More than three times a week    Attends Religious Services: More than 4 times per year    Active Member of [employer] or Organizations: No    Attends Banker Meetings: Never    Marital Status: Widowed    Tobacco Counseling Counseling given: Not Answered    Clinical Intake:  Pre-visit preparation completed: Yes  Pain : 0-10 Pain Score: 3  Pain Type: Chronic pain Pain Location: Knee Pain Orientation: Left, Right Pain Descriptors / Indicators: Aching     BMI - recorded: 37.13 Nutritional Status: BMI > 30  Obese Nutritional Risks: None Diabetes: No  Lab Results  Component Value Date   HGBA1C 5.8 (H) 02/22/2023   HGBA1C 5.6 12/08/2021   HGBA1C 5.6 06/10/2021     How often do you need to have someone help you when you read instructions, pamphlets, or other written materials from your doctor or pharmacy?: 1 - Never  Interpreter Needed?: No  Information entered by :: Vina Ned, CMA   Activities of Daily Living     01/10/2024    2:30 PM 08/29/2023    8:17 AM  In your present state of health, do you have any difficulty performing the following activities:  Hearing? 0 0  Vision? 0 0  Difficulty concentrating or making decisions? 0 0  Walking or climbing stairs? 1   Comment uses cane prn   Dressing or bathing? 0   Doing errands, shopping? 0   Preparing Food and eating ? N   Using the Toilet? N   In the past six months, have you accidently leaked urine? Y   Comment wears a panty liner   Do you have problems with loss of bowel control? N   Managing your Medications?  N    Managing your Finances? Y   Comment daughter helps manage finances   Housekeeping or managing your Housekeeping? N     Patient Care Team: [name] SQUIBB, DO as PCP - General (Family Medicine) Myrna Adine Anes, MD as Consulting Physician (Ophthalmology)  I have updated your Care Teams any recent Medical Services you may have received from other providers in the past year.     Assessment:   This is a routine wellness examination for Jaime Freeman.  Hearing/Vision screen Hearing Screening - Comments:: Denies hearing loss  Vision Screening - Comments:: Gets routine eye exams, Dr. Adine Myrna, [place]   Goals Addressed             This Visit's Progress    Patient Stated       Lose 20 lbs       Depression Screen     01/10/2024    2:42 PM 04/25/2023    3:16 PM 02/15/2023    3:43 PM 02/15/2023    3:42 PM 01/04/2023    3:13 PM 12/02/2022    1:48 PM 11/26/2022    5:03 PM  PHQ 2/9 Scores  PHQ - 2 Score 0 0 0 0 0  3  PHQ- 9 Score 2 2   0  7  Exception Documentation      Patient refusal     Fall Risk     01/10/2024    2:47 PM 01/04/2023    3:19 PM 11/26/2022    5:03 PM 09/30/2022    3:44 PM 03/11/2022    2:50 PM  Fall Risk   Falls in the past year? 1 1 0 1 0  Number falls in past yr: 0 0 0 0 0  Injury with Fall? 1 0 0 0 0  Risk for fall due to : History of fall(s);Impaired balance/gait;Orthopedic patient;Impaired mobility History of fall(s) No Fall Risks No Fall Risks No Fall Risks  Follow up Falls evaluation completed;Education provided Falls prevention discussed;Falls evaluation completed Falls evaluation completed Falls evaluation completed Falls evaluation completed      Data saved with a previous flowsheet row definition    MEDICARE RISK AT HOME:  Medicare Risk at Home Any stairs in or around the home?: Yes (also uses chair lift) If so, are there any without handrails?: No Home free of loose throw rugs in walkways, pet beds, electrical cords, etc?:  Yes Adequate lighting in your home to reduce risk of falls?: Yes Life alert?: No Use of a cane, walker or w/c?: Yes (uses cane prn) Grab bars in the bathroom?: No Shower chair or bench in shower?: No Elevated toilet seat or a handicapped toilet?: No  TIMED UP AND GO:  Was the test performed?  No  Cognitive Function: 6CIT completed        01/10/2024    2:49 PM 01/04/2023    3:21 PM 12/08/2021    3:31 PM  6CIT Screen  What Year? 0 points 0 points 0 points  What month? 0 points 0 points 0 points  What time? 0 points 0 points 0 points  Count back from 20 0 points 0 points 0 points  Months in reverse 0 points 0 points 2 points  Repeat phrase 0 points 0 points 4 points  Total Score 0 points 0 points 6 points    Immunizations Immunization History  Administered Date(s) Administered   PNEUMOCOCCAL CONJUGATE-20 12/08/2021   Tdap 06/30/2020    Screening Tests Health Maintenance  Topic Date  Due   Mammogram  Never done   Zoster Vaccines- Shingrix (1 of 2) Never done   DEXA SCAN  Never done   Influenza Vaccine  07/24/2024 (Originally 11/25/2023)   Fecal DNA (Cologuard)  12/14/2024   Medicare Annual Wellness (AWV)  01/09/2025   DTaP/Tdap/Td (2 - Td or Tdap) 07/01/2030   Pneumococcal Vaccine: 50+ Years  Completed   Hepatitis C Screening  Completed   HPV VACCINES  Aged Out   Meningococcal B Vaccine  Aged Out   COVID-19 Vaccine  Discontinued    Health Maintenance Items Addressed: Mammogram ordered, DEXA ordered, See Nurse Notes at the end of this note  Additional Screening:  Vision Screening: Recommended annual ophthalmology exams for early detection of glaucoma and other disorders of the eye. Is the patient up to date with their annual eye exam?  Yes  Who is the provider or what is the name of the office in which the patient attends annual eye exams? Dr. Adine Novak @ [facility]  Dental Screening: Recommended annual dental exams for proper oral  hygiene  Community Resource Referral / Chronic Care Management: CRR required this visit?  No   CCM required this visit?  No   Plan:    I have personally reviewed and noted the following in the patient's chart:   Medical and social history Use of alcohol , tobacco or illicit drugs  Current medications and supplements including opioid prescriptions. Patient is not currently taking opioid prescriptions. Functional ability and status Nutritional status Physical activity Advanced directives List of other physicians Hospitalizations, surgeries, and ER visits in previous 12 months Vitals Screenings to include cognitive, depression, and falls Referrals and appointments  In addition, I have reviewed and discussed with patient certain preventive protocols, quality metrics, and best practice recommendations. A written personalized care plan for preventive services as well as general preventive health recommendations were provided to patient.   Vina Ned, CMA   01/10/2024   After Visit Summary: (Mail) Due to this being a telephonic visit, the after visit summary with patients personalized plan was offered to patient via mail   Notes:  Placed orders for MMG and DEXA Made appt for 02/13/24. Last seen 02/19/23 (cancelled last 2 appts) Declined flu and covid vaccines unable to tolerate per patient. Declined shingles vaccines

## 2024-01-11 ENCOUNTER — Ambulatory Visit: Payer: Self-pay

## 2024-01-11 ENCOUNTER — Ambulatory Visit: Admitting: Student

## 2024-01-11 NOTE — Telephone Encounter (Signed)
 FYI Only or Action Required?: FYI only for provider.  Patient was last seen in primary care on 04/25/2023 by Melvin Pao, NP.  Called Nurse Triage reporting Sore Throat.  Symptoms began several days ago.  Interventions attempted: Nothing.  Symptoms are: gradually worsening.  Triage Disposition: Call PCP Within 24 Hours  Patient/caregiver understands and will follow disposition?: Yes    Copied from CRM [id number]. Topic: Clinical - Red Word Triage >> Jan 11, 2024  2:19 PM Wess RAMAN wrote: Red Word that prompted transfer to Nurse Triage: bad sore throat, difficulty breathing, sinus issues, left ear pain  Patient stated her entire family is sick and she would like Augmentin  called in Reason for Disposition  [1] Exposure to family member (or spouse or boyfriend/girlfriend) with test-proven strep AND [2] within last 10 days  Answer Assessment - Initial Assessment Questions 1. ONSET: When did the throat start hurting? (Hours or days ago)      Last couple of days 2. SEVERITY: How bad is the sore throat? (Scale 1-10; mild, moderate or severe)     moderate 3. STREP EXPOSURE: Has there been any exposure to strep within the past week? If Yes, ask: What type of contact occurred?      Yes, in the family 4.  VIRAL SYMPTOMS: Are there any symptoms of a cold, such as a runny nose, cough, hoarse voice or red eyes?      Hoarse, dryness to nares. 5. FEVER: Do you have a fever? If Yes, ask: What is your temperature, how was it measured, and when did it start?     denies 6. PUS ON THE TONSILS: Is there pus on the tonsils in the back of your throat?     denies 7. OTHER SYMPTOMS: Do you have any other symptoms? (e.g., difficulty breathing, headache, rash)     Sob, sore throat, left ear pain, congestion 8. PREGNANCY: Is there any chance you are pregnant? When was your last menstrual period?     na  Protocols used: Sore Throat-A-AH

## 2024-01-11 NOTE — Telephone Encounter (Signed)
 Message from nurse triage stated that patient was going to UC. If patient is not going to UC, she will need an appointment for evaluation before antibiotics can be sent in. Please call to schedule.    Copied from CRM [id number]. Topic: Clinical - Medication Question >> Jan 11, 2024  2:30 PM Jasmin G wrote: Reason for CRM: Pt spoke to NT today regarding experiencing flu like symptoms like sore throat, runny nose and pain on left ear, please refer to recent Encounters for more info, pt states that she's not able to drive to a clinic at the moment and would like to see if her PCP, Dr. Vicci could prescribe augmentin  or an antibiotic to treat. Call pt back at [phone].

## 2024-01-11 NOTE — Telephone Encounter (Signed)
 Patient states that she is unable to get in here for an appointment nor can she make it to UC. She does not have a ride.

## 2024-01-12 NOTE — Telephone Encounter (Signed)
 DJ informed

## 2024-01-13 ENCOUNTER — Ambulatory Visit

## 2024-01-13 ENCOUNTER — Ambulatory Visit: Payer: Self-pay

## 2024-01-13 NOTE — Telephone Encounter (Signed)
 FYI Only or Action Required?: FYI only for provider.  Patient was last seen in primary care on 04/25/2023 by Melvin Pao, NP.  Called Nurse Triage reporting Sore Throat.  Symptoms began yesterday.  Interventions attempted: Nothing.  Symptoms are: unchanged.  Triage Disposition: See Physician Within 24 Hours  Patient/caregiver understands and will follow disposition?: Yes  Patient is not driving and is unable to make it to office.  She requested a virtual visit for sore throat.  States they were going to do one yesterday for her but she felt better, today, sore throat is worse.  Copied from CRM [id number]. Topic: Clinical - Red Word Triage >> Jan 13, 2024 11:41 AM Tiffini S wrote: [facility] that prompted transfer to Nurse Triage: sore throat, sinus, yellow greenish mucus with a cough that is just beginning this morning Reason for Disposition  SEVERE throat pain (e.g., excruciating)  Answer Assessment - Initial Assessment Questions 1. ONSET: When did the throat start hurting? (Hours or days ago)      Last night 2. SEVERITY: How bad is the sore throat? (Scale 1-10; mild, moderate or severe)     moderate 3. STREP EXPOSURE: Has there been any exposure to strep within the past week? If Yes, ask: What type of contact occurred?      denies 4.  VIRAL SYMPTOMS: Are there any symptoms of a cold, such as a runny nose, cough, hoarse voice or red eyes?      Cough, hoarse voice, runny nose 5. FEVER: Do you have a fever? If Yes, ask: What is your temperature, how was it measured, and when did it start?     denies 6. PUS ON THE TONSILS: Is there pus on the tonsils in the back of your throat?     Na  7. OTHER SYMPTOMS: Do you have any other symptoms? (e.g., difficulty breathing, headache, rash)     Cough yellow/green sputum 8. PREGNANCY: Is there any chance you are pregnant? When was your last menstrual period?     na  Protocols used: Sore Throat-A-AH

## 2024-01-14 DIAGNOSIS — J029 Acute pharyngitis, unspecified: Secondary | ICD-10-CM | POA: Diagnosis not present

## 2024-01-14 DIAGNOSIS — J069 Acute upper respiratory infection, unspecified: Secondary | ICD-10-CM | POA: Diagnosis not present

## 2024-01-14 DIAGNOSIS — Z03818 Encounter for observation for suspected exposure to other biological agents ruled out: Secondary | ICD-10-CM | POA: Diagnosis not present

## 2024-01-14 DIAGNOSIS — J0100 Acute maxillary sinusitis, unspecified: Secondary | ICD-10-CM | POA: Diagnosis not present

## 2024-01-14 DIAGNOSIS — U071 COVID-19: Secondary | ICD-10-CM | POA: Diagnosis not present

## 2024-01-14 DIAGNOSIS — H66001 Acute suppurative otitis media without spontaneous rupture of ear drum, right ear: Secondary | ICD-10-CM | POA: Diagnosis not present

## 2024-01-16 ENCOUNTER — Ambulatory Visit: Admitting: Family Medicine

## 2024-01-26 ENCOUNTER — Other Ambulatory Visit: Payer: Self-pay | Admitting: Family Medicine

## 2024-01-27 ENCOUNTER — Other Ambulatory Visit: Payer: Self-pay | Admitting: Family Medicine

## 2024-01-27 MED ORDER — OMEPRAZOLE 20 MG PO CPDR: 20.0000 mg | DELAYED_RELEASE_CAPSULE | Freq: Every day | ORAL | 1 refills | Status: AC

## 2024-01-27 NOTE — Telephone Encounter (Signed)
 Pt is requesting the tiny capsules instead of the big ones as she cannot swallow them. Pt also requested for prescription to be placed today if possible as she states that she only has 3 pills left and will be out soon.

## 2024-01-27 NOTE — Telephone Encounter (Signed)
 Copied from CRM #[id number]. Topic: Clinical - Medication Refill >> Jan 27, 2024  9:34 AM Jasmin G wrote: Medication: omeprazole  (PRILOSEC) 20 MG capsule. Pt is requesting the tiny capsules instead of the big ones as she cannot swallow them. Pt also requested for prescription to be placed today if possible as she states that she only has 3 pills left and will be out soon.  Has the patient contacted their pharmacy? No (Agent: If no, request that the patient contact the pharmacy for the refill. If patient does not wish to contact the pharmacy document the reason why and proceed with request.) (Agent: If yes, when and what did the pharmacy advise?)  This is the patient's preferred pharmacy:  [facility] [address] [facility] - [place], [place] - [address] [place] [address] Phone: [phone] Fax: [phone]  Is this the correct pharmacy for this prescription? Yes If no, delete pharmacy and type the correct one.   Has the prescription been filled recently? Yes  Is the patient out of the medication? Yes  Has the patient been seen for an appointment in the last year OR does the patient have an upcoming appointment? Yes  Can we respond through MyChart? No  Agent: Please be advised that Rx refills may take up to 3 business days. We ask that you follow-up with your pharmacy.

## 2024-01-27 NOTE — Telephone Encounter (Signed)
 Already refilled in a separate encounter today, will refuse this request.  Requested Prescriptions  Pending Prescriptions Disp Refills   omeprazole  (PRILOSEC) 20 MG capsule [Pharmacy Med Name: OMEPRAZOLE  20 MG CAP[*]] 90 capsule 1    Sig: TAKE ONE CAPSULE BY MOUTH ONE TIME DAILY     Gastroenterology: Proton Pump Inhibitors Passed - 01/27/2024  3:24 PM      Passed - Valid encounter within last 12 months    Recent Outpatient Visits   None

## 2024-01-27 NOTE — Telephone Encounter (Signed)
 Requested Prescriptions  Pending Prescriptions Disp Refills   omeprazole  (PRILOSEC) 20 MG capsule 90 capsule 1    Sig: Take 1 capsule (20 mg total) by mouth daily.     Gastroenterology: Proton Pump Inhibitors Passed - 01/27/2024  3:23 PM      Passed - Valid encounter within last 12 months    Recent Outpatient Visits   None

## 2024-02-13 ENCOUNTER — Encounter: Payer: Self-pay | Admitting: Family Medicine

## 2024-02-13 ENCOUNTER — Ambulatory Visit (INDEPENDENT_AMBULATORY_CARE_PROVIDER_SITE_OTHER): Admitting: Family Medicine

## 2024-02-13 VITALS — BP 155/90 | HR 65 | Ht 62.0 in | Wt 203.0 lb

## 2024-02-13 DIAGNOSIS — F332 Major depressive disorder, recurrent severe without psychotic features: Secondary | ICD-10-CM

## 2024-02-13 DIAGNOSIS — E785 Hyperlipidemia, unspecified: Secondary | ICD-10-CM

## 2024-02-13 DIAGNOSIS — E538 Deficiency of other specified B group vitamins: Secondary | ICD-10-CM

## 2024-02-13 DIAGNOSIS — R7301 Impaired fasting glucose: Secondary | ICD-10-CM | POA: Diagnosis not present

## 2024-02-13 DIAGNOSIS — F419 Anxiety disorder, unspecified: Secondary | ICD-10-CM

## 2024-02-13 DIAGNOSIS — I129 Hypertensive chronic kidney disease with stage 1 through stage 4 chronic kidney disease, or unspecified chronic kidney disease: Secondary | ICD-10-CM

## 2024-02-13 DIAGNOSIS — M170 Bilateral primary osteoarthritis of knee: Secondary | ICD-10-CM

## 2024-02-13 DIAGNOSIS — D692 Other nonthrombocytopenic purpura: Secondary | ICD-10-CM

## 2024-02-13 DIAGNOSIS — E559 Vitamin D deficiency, unspecified: Secondary | ICD-10-CM | POA: Diagnosis not present

## 2024-02-13 LAB — BAYER DCA HB A1C WAIVED: HB A1C (BAYER DCA - WAIVED): 5.6 % (ref 4.8–5.6)

## 2024-02-13 MED ORDER — DICLOFENAC SODIUM 1 % EX GEL: 4.0000 g | Freq: Four times a day (QID) | CUTANEOUS | 4 refills | Status: AC

## 2024-02-13 MED ORDER — BENAZEPRIL HCL 20 MG PO TABS: 20.0000 mg | ORAL_TABLET | Freq: Every day | ORAL | 0 refills | Status: DC

## 2024-02-13 MED ORDER — FLUOXETINE HCL 10 MG PO CAPS: 10.0000 mg | ORAL_CAPSULE | Freq: Every day | ORAL | 1 refills | Status: DC

## 2024-02-13 MED ORDER — QUETIAPINE FUMARATE 25 MG PO TABS: 25.0000 mg | ORAL_TABLET | Freq: Every day | ORAL | 0 refills | Status: DC

## 2024-02-13 NOTE — Assessment & Plan Note (Signed)
 Interested in seeing ortho. Referral generated today.

## 2024-02-13 NOTE — Assessment & Plan Note (Signed)
 Not doing well. Will continue current regimen and restart low dose prozac . Recheck 1 month. Call with any concerns.

## 2024-02-13 NOTE — Assessment & Plan Note (Signed)
 Not under good control. Did not take her medicine today. Encouraged her to take her medicine daily. Recheck 1 month.

## 2024-02-13 NOTE — Assessment & Plan Note (Signed)
 Rechecking labs today. Await results. Treat as needed.

## 2024-02-13 NOTE — Assessment & Plan Note (Signed)
 Doing well with A1c of 5.6. continue current regimen. Continue to monitor.

## 2024-02-13 NOTE — Assessment & Plan Note (Signed)
 Encouraged diet and exercise with goal of losing 1-2lbs per week. Call with any concerns.

## 2024-02-13 NOTE — Progress Notes (Signed)
 BP (!) 155/90   Pulse 65   Ht 5' 2 (1.575 m)   Wt 203 lb (92.1 kg)   SpO2 98%   BMI 37.13 kg/m    Subjective:    Patient ID: Jaime Freeman, female    DOB: [birth year], 68 y.o.   MRN: [id number]  HPI: Jaime Freeman is a 68 y.o. female who presents today for follow up after not being seen in about a year.   Chief Complaint  Patient presents with   Hyperlipidemia   Hypertension   Depression   Anxiety   Knee Pain    Would like to discuss knee replacements. Left knee is worse   Impaired Fasting Glucose HbA1C:  Lab Results  Component Value Date   HGBA1C 5.8 (H) 02/22/2023   Duration of elevated blood sugar: chronic Polydipsia: no Polyuria: no Weight change: no Visual disturbance: no Glucose Monitoring: no    Accucheck frequency: Not Checking Diabetic Education: Not Completed Family history of diabetes: no  HYPERTENSION / HYPERLIPIDEMIA- has not taken her medicine today Satisfied with current treatment? yes Duration of hypertension: chronic BP monitoring frequency: not checking BP medication side effects: no Past BP meds: benazepril  Duration of hyperlipidemia: chronic Cholesterol medication side effects: not on anything Cholesterol supplements: none Past cholesterol medications: none Medication compliance: fair compliance Aspirin : no Recent stressors: yes Recurrent headaches: no Visual changes: no Palpitations: no Dyspnea: no Chest pain: no Lower extremity edema: no Dizzy/lightheaded: no  ANXIETY/DEPRESSION Duration: chronic Status:exacerbated Anxious mood: yes  Excessive worrying: yes Irritability: yes  Sweating: no Nausea: no Palpitations:yes Hyperventilation: no Panic attacks: yes Agoraphobia: no  Obscessions/compulsions: yes Depressed mood: yes    02/13/2024   10:58 AM 01/10/2024    2:42 PM 04/25/2023    3:16 PM 02/15/2023    3:43 PM 02/15/2023    3:42 PM  Depression screen PHQ 2/9  Decreased Interest 0 0 0 0 0  Down, Depressed,  Hopeless 0 0 0 0   PHQ - 2 Score 0 0 0 0 0  Altered sleeping 0 0 0    Tired, decreased energy 0 1 1    Change in appetite 0 0 0    Feeling bad or failure about yourself  0 0 0    Trouble concentrating 1 1 1     Moving slowly or fidgety/restless 0 0 0    Suicidal thoughts 0 0 0    PHQ-9 Score 1 2 2     Difficult doing work/chores Not difficult at all Not difficult at all         02/13/2024   11:11 AM 04/25/2023    3:16 PM 02/15/2023    3:43 PM 11/26/2022    5:03 PM  GAD 7 : Generalized Anxiety Score  Nervous, Anxious, on Edge 1 1 1 1   Control/stop worrying 0 0 0 1  Worry too much - different things 1 0 0 1  Trouble relaxing 0 0 0 1  Restless 0 0 0 1  Easily annoyed or irritable 1 0 0 0  Afraid - awful might happen 0 0 0 0  Total GAD 7 Score 3 1 1 5   Anxiety Difficulty Not difficult at all  Not difficult at all Not difficult at all   Anhedonia: no Weight changes: yes Insomnia: yes   Hypersomnia: no Fatigue/loss of energy: yes Feelings of worthlessness: yes Feelings of guilt: yes Impaired concentration/indecisiveness: yes Suicidal ideations: no  Crying spells: yes Recent Stressors/Life Changes: yes   Relationship problems: no  Family stress: no     Financial stress: no    Job stress: yes    Recent death/loss: no   Relevant past medical, surgical, family and social history reviewed and updated as indicated. Interim medical history since our last visit reviewed. Allergies and medications reviewed and updated.  Review of Systems  Constitutional: Negative.   Respiratory: Negative.    Cardiovascular: Negative.   Genitourinary: Negative.   Musculoskeletal:  Positive for arthralgias. Negative for back pain, gait problem, joint swelling, myalgias, neck pain and neck stiffness.  Skin: Negative.   Neurological: Negative.   Psychiatric/Behavioral: Negative.      Per HPI unless specifically indicated above     Objective:    BP (!) 155/90   Pulse 65   Ht 5' 2 (1.575  m)   Wt 203 lb (92.1 kg)   SpO2 98%   BMI 37.13 kg/m   Wt Readings from Last 3 Encounters:  02/13/24 203 lb (92.1 kg)  01/10/24 203 lb (92.1 kg)  08/29/23 196 lb (88.9 kg)    Physical Exam Vitals and nursing note reviewed.  Constitutional:      General: She is not in acute distress.    Appearance: Normal appearance. She is obese. She is not ill-appearing, toxic-appearing or diaphoretic.  HENT:     Head: Normocephalic and atraumatic.     Right Ear: External ear normal.     Left Ear: External ear normal.     Nose: Nose normal.     Mouth/Throat:     Mouth: Mucous membranes are moist.     Pharynx: Oropharynx is clear.  Eyes:     General: No scleral icterus.       Right eye: No discharge.        Left eye: No discharge.     Extraocular Movements: Extraocular movements intact.     Conjunctiva/sclera: Conjunctivae normal.     Pupils: Pupils are equal, round, and reactive to light.  Cardiovascular:     Rate and Rhythm: Normal rate and regular rhythm.     Pulses: Normal pulses.     Heart sounds: Normal heart sounds. No murmur heard.    No friction rub. No gallop.  Pulmonary:     Effort: Pulmonary effort is normal. No respiratory distress.     Breath sounds: Normal breath sounds. No stridor. No wheezing, rhonchi or rales.  Chest:     Chest wall: No tenderness.  Musculoskeletal:        General: Normal range of motion.     Cervical back: Normal range of motion and neck supple.  Skin:    General: Skin is warm and dry.     Capillary Refill: Capillary refill takes less than 2 seconds.     Coloration: Skin is not jaundiced or pale.     Findings: No bruising, erythema, lesion or rash.  Neurological:     General: No focal deficit present.     Mental Status: She is alert and oriented to person, place, and time. Mental status is at baseline.  Psychiatric:        Mood and Affect: Mood normal.        Behavior: Behavior normal.        Thought Content: Thought content normal.         Judgment: Judgment normal.     Results for orders placed or performed in visit on 04/25/23  Rapid Strep screen([facility])   Collection Time: 04/25/23  3:29 PM   Specimen: Other  Other  Result Value Ref Range   Strep Gp A Ag, IA W/Reflex Negative Negative  Culture, Group A Strep   Collection Time: 04/25/23  3:29 PM   Other  Result Value Ref Range   Strep A Culture Negative   Veritor Flu A/B Waived   Collection Time: 04/25/23  3:29 PM  Result Value Ref Range   Influenza A Negative Negative   Influenza B Negative Negative  Novel Coronavirus, NAA ([facility])   Collection Time: 04/25/23  3:38 PM   Specimen: Nasopharyngeal(NP) swabs in vial transport medium  Result Value Ref Range   SARS-CoV-2, NAA Not Detected Not Detected      Assessment & Plan:   Problem List Items Addressed This Visit       Cardiovascular and Mediastinum   Senile purpura   Reassured patient. Continue to monitor.       Relevant Medications   benazepril  (LOTENSIN ) 20 MG tablet   Other Relevant Orders   CBC with Differential/Platelet   Comprehensive metabolic panel with GFR   TSH     Endocrine   IFG (impaired fasting glucose)   Doing well with A1c of 5.6. continue current regimen. Continue to monitor.       Relevant Orders   CBC with Differential/Platelet   Comprehensive metabolic panel with GFR   TSH   Bayer DCA Hb A1c Waived   Microalbumin, Urine Waived     Musculoskeletal and Integument   Osteoarthritis of both knees   Interested in seeing ortho. Referral generated today.       Relevant Orders   Ambulatory referral to Orthopedic Surgery     Genitourinary   Benign hypertensive renal disease - Primary   Not under good control. Did not take her medicine today. Encouraged her to take her medicine daily. Recheck 1 month.       Relevant Orders   CBC with Differential/Platelet   Comprehensive metabolic panel with GFR   TSH   Microalbumin, Urine Waived     Other   Morbid  obesity (HCC)   Encouraged diet and exercise with goal of losing 1-2lbs per week. Call with any concerns.       Relevant Orders   CBC with Differential/Platelet   Comprehensive metabolic panel with GFR   TSH   Vitamin D  deficiency disease   Rechecking labs today. Await results. Treat as needed.       Relevant Orders   CBC with Differential/Platelet   Comprehensive metabolic panel with GFR   TSH   VITAMIN D  25 Hydroxy (Vit-D Deficiency, Fractures)   Vitamin B12 deficiency   Rechecking labs today. Await results. Treat as needed.       Relevant Orders   CBC with Differential/Platelet   Comprehensive metabolic panel with GFR   TSH   B12   Depression   Not doing well. Will continue current regimen and restart low dose prozac . Recheck 1 month. Call with any concerns.       Relevant Medications   QUEtiapine  (SEROQUEL ) 25 MG tablet   FLUoxetine  (PROZAC ) 10 MG capsule   Other Relevant Orders   CBC with Differential/Platelet   Comprehensive metabolic panel with GFR   TSH   Anxiety   Not doing well. Will continue current regimen and restart low dose prozac . Recheck 1 month. Call with any concerns.       Relevant Medications   FLUoxetine  (PROZAC ) 10 MG capsule   Other Relevant Orders   CBC with Differential/Platelet   Comprehensive metabolic panel  with GFR   TSH   Dyslipidemia   Rechecking labs today. Await results. Treat as needed.       Relevant Orders   CBC with Differential/Platelet   Comprehensive metabolic panel with GFR   Lipid Panel w/o Chol/HDL Ratio   TSH     Follow up plan: Return in about 4 weeks (around 03/12/2024) for physical OK to put in a non-physical spot.

## 2024-02-13 NOTE — Assessment & Plan Note (Signed)
 Reassured patient. Continue to monitor.

## 2024-02-14 LAB — COMPREHENSIVE METABOLIC PANEL WITH GFR
ALT: 11 IU/L (ref 0–32)
AST: 21 IU/L (ref 0–40)
Albumin: 4.4 g/dL (ref 3.9–4.9)
Alkaline Phosphatase: 76 IU/L (ref 49–135)
BUN/Creatinine Ratio: 25 (ref 12–28)
BUN: 16 mg/dL (ref 8–27)
Bilirubin Total: 0.3 mg/dL (ref 0.0–1.2)
CO2: 22 mmol/L (ref 20–29)
Calcium: 9.7 mg/dL (ref 8.7–10.3)
Chloride: 104 mmol/L (ref 96–106)
Creatinine, Ser: 0.63 mg/dL (ref 0.57–1.00)
Globulin, Total: 2.2 g/dL (ref 1.5–4.5)
Glucose: 90 mg/dL (ref 70–99)
Potassium: 5.3 mmol/L — ABNORMAL HIGH (ref 3.5–5.2)
Sodium: 140 mmol/L (ref 134–144)
Total Protein: 6.6 g/dL (ref 6.0–8.5)
eGFR: 97 mL/min/1.73 (ref >59)

## 2024-02-14 LAB — CBC WITH DIFFERENTIAL/PLATELET
Basophils Absolute: 0.1 x10E3/uL (ref 0.0–0.2)
Basos: 1 %
EOS (ABSOLUTE): 0.2 x10E3/uL (ref 0.0–0.4)
Eos: 3 %
Hematocrit: 40.3 % (ref 34.0–46.6)
Hemoglobin: 13.2 g/dL (ref 11.1–15.9)
Immature Grans (Abs): 0 x10E3/uL (ref 0.0–0.1)
Immature Granulocytes: 0 %
Lymphocytes Absolute: 2.2 x10E3/uL (ref 0.7–3.1)
Lymphs: 28 %
MCH: 33.5 pg — ABNORMAL HIGH (ref 26.6–33.0)
MCHC: 32.8 g/dL (ref 31.5–35.7)
MCV: 102 fL — ABNORMAL HIGH (ref 79–97)
Monocytes Absolute: 0.6 x10E3/uL (ref 0.1–0.9)
Monocytes: 7 %
Neutrophils Absolute: 4.9 x10E3/uL (ref 1.4–7.0)
Neutrophils: 60 %
Platelets: 299 x10E3/uL (ref 150–450)
RBC: 3.94 x10E6/uL (ref 3.77–5.28)
RDW: 11.8 % (ref 11.7–15.4)
WBC: 8 x10E3/uL (ref 3.4–10.8)

## 2024-02-14 LAB — LIPID PANEL W/O CHOL/HDL RATIO
Cholesterol, Total: 198 mg/dL (ref 100–199)
HDL: 60 mg/dL (ref >39)
LDL Chol Calc (NIH): 112 mg/dL — ABNORMAL HIGH (ref 0–99)
Triglycerides: 148 mg/dL (ref 0–149)
VLDL Cholesterol Cal: 26 mg/dL (ref 5–40)

## 2024-02-14 LAB — VITAMIN B12: Vitamin B-12: 504 pg/mL (ref 232–1245)

## 2024-02-14 LAB — VITAMIN D 25 HYDROXY (VIT D DEFICIENCY, FRACTURES): Vit D, 25-Hydroxy: 24.1 ng/mL — ABNORMAL LOW (ref 30.0–100.0)

## 2024-02-14 LAB — TSH: TSH: 0.721 u[IU]/mL (ref 0.450–4.500)

## 2024-02-15 ENCOUNTER — Ambulatory Visit: Payer: Self-pay | Admitting: Family Medicine

## 2024-02-15 DIAGNOSIS — E875 Hyperkalemia: Secondary | ICD-10-CM

## 2024-02-17 ENCOUNTER — Encounter

## 2024-02-20 ENCOUNTER — Other Ambulatory Visit: Payer: Self-pay | Admitting: Family Medicine

## 2024-02-22 ENCOUNTER — Telehealth: Payer: Self-pay | Admitting: Family Medicine

## 2024-02-22 NOTE — Telephone Encounter (Signed)
Routing to provider for new RX

## 2024-02-22 NOTE — Telephone Encounter (Signed)
 Requested Prescriptions  Pending Prescriptions Disp Refills   etodolac  (LODINE ) 400 MG tablet [Pharmacy Med Name: ETODOLAC  400 MG TAB] 180 tablet 0    Sig: TAKE ONE TABLET BY MOUTH TWICE A DAY AS NEEDED     Analgesics:  NSAIDS Failed - 02/22/2024 11:39 AM      Failed - Manual Review: Labs are only required if the patient has taken medication for more than 8 weeks.      Passed - Cr in normal range and within 360 days    Creatinine, Ser  Date Value Ref Range Status  02/13/2024 0.63 0.57 - 1.00 mg/dL Final         Passed - HGB in normal range and within 360 days    Hemoglobin  Date Value Ref Range Status  02/13/2024 13.2 11.1 - 15.9 g/dL Final         Passed - PLT in normal range and within 360 days    Platelets  Date Value Ref Range Status  02/13/2024 299 150 - 450 x10E3/uL Final         Passed - HCT in normal range and within 360 days    Hematocrit  Date Value Ref Range Status  02/13/2024 40.3 34.0 - 46.6 % Final         Passed - eGFR is 30 or above and within 360 days    GFR calc Af Amer  Date Value Ref Range Status  05/27/2020 107 >59 mL/min/1.73 Final    Comment:    **In accordance with recommendations from the NKF-ASN Task force,**   [facility] is in the process of updating its eGFR calculation to the   2021 CKD-EPI creatinine equation that estimates kidney function   without a race variable.    GFR calc non Af Amer  Date Value Ref Range Status  05/27/2020 93 >59 mL/min/1.73 Final   eGFR  Date Value Ref Range Status  02/13/2024 97 >59 mL/min/1.73 Final         Passed - Patient is not pregnant      Passed - Valid encounter within last 12 months    Recent Outpatient Visits           1 week ago Benign hypertensive renal disease   [facility], Megan P, DO               benazepril  (LOTENSIN ) 20 MG tablet [Pharmacy Med Name: BENAZEPRIL  20 MG TAB[*]] 90 tablet 0    Sig: TAKE ONE TABLET BY MOUTH ONE TIME DAILY      Cardiovascular:  ACE Inhibitors Failed - 02/22/2024 11:39 AM      Failed - K in normal range and within 180 days    Potassium  Date Value Ref Range Status  02/13/2024 5.3 (H) 3.5 - 5.2 mmol/L Final         Failed - Last BP in normal range    BP Readings from Last 1 Encounters:  02/13/24 (!) 155/90         Passed - Cr in normal range and within 180 days    Creatinine, Ser  Date Value Ref Range Status  02/13/2024 0.63 0.57 - 1.00 mg/dL Final         Passed - Patient is not pregnant      Passed - Valid encounter within last 6 months    Recent Outpatient Visits           1 week ago Benign hypertensive renal disease  [facility], [place], [place]

## 2024-02-22 NOTE — Telephone Encounter (Unsigned)
 Copied from CRM [id number]. Topic: General - Other >> Feb 22, 2024  1:52 PM Hadassah PARAS wrote: Reason for CRM: Pharmacist Tristin from [facility] at [place] accidentally deleted prescription for etodolac  (LODINE ) 400 MG tablet and is requesting a copy to be sent. Please resend

## 2024-02-23 ENCOUNTER — Other Ambulatory Visit: Payer: Self-pay

## 2024-02-23 ENCOUNTER — Other Ambulatory Visit (HOSPITAL_COMMUNITY): Payer: Self-pay

## 2024-02-23 ENCOUNTER — Other Ambulatory Visit: Payer: Self-pay | Admitting: Family Medicine

## 2024-02-23 ENCOUNTER — Telehealth: Payer: Self-pay

## 2024-02-23 DIAGNOSIS — F332 Major depressive disorder, recurrent severe without psychotic features: Secondary | ICD-10-CM

## 2024-02-23 MED ORDER — ETODOLAC 400 MG PO TABS: 400.0000 mg | ORAL_TABLET | Freq: Two times a day (BID) | ORAL | 0 refills | Status: AC | PRN

## 2024-02-23 NOTE — Telephone Encounter (Signed)
 Rx has that she can only take the blue point, if the pharmacy didn't listen, I apologize, but it was on the Rx. She can throw out any extra medicine if she can't take it, unfortunately there's nothing we can do with it.

## 2024-02-23 NOTE — Telephone Encounter (Signed)
 Copied from CRM [id number]. Topic: Clinical - Medication Question >> Feb 23, 2024  3:27 PM [name] wrote: Reason for CRM: The patient called in wanting her provider to know she once again was sent the wrong QUEtiapine  (SEROQUEL ) 25 MG tablet. She states she can only take what is called Blue point. She states her daughter in law has been called from the hospital where they have the Blue point and she will be getting the correct 1. She wants to know what to do with all the extra medicine she has that she does not need? Please assist patient further.

## 2024-02-24 ENCOUNTER — Other Ambulatory Visit: Payer: Self-pay

## 2024-02-27 ENCOUNTER — Other Ambulatory Visit: Payer: Self-pay

## 2024-02-28 ENCOUNTER — Other Ambulatory Visit: Payer: Self-pay

## 2024-02-29 ENCOUNTER — Telehealth: Payer: Self-pay | Admitting: Family Medicine

## 2024-02-29 ENCOUNTER — Other Ambulatory Visit: Payer: Self-pay

## 2024-02-29 DIAGNOSIS — F332 Major depressive disorder, recurrent severe without psychotic features: Secondary | ICD-10-CM

## 2024-02-29 MED ORDER — QUETIAPINE FUMARATE 25 MG PO TABS
25.0000 mg | ORAL_TABLET | Freq: Every day | ORAL | 0 refills | Status: AC
  Filled 2024-02-29: qty 225, 64d supply, fill #0
  Filled 2024-03-01: qty 75, 37d supply, fill #0
  Filled 2024-05-07: qty 75, 37d supply, fill #1

## 2024-02-29 NOTE — Telephone Encounter (Signed)
 See other phone encounter.

## 2024-02-29 NOTE — Telephone Encounter (Signed)
 See other phone encounter. Message has already been sent to provider to send RX to [facility].

## 2024-02-29 NOTE — Telephone Encounter (Signed)
 Routing to provider. See message from patient. Patient requesting to have Seroquel  sent to [facility] as they are the only ones who have the correct brand for the patient.

## 2024-02-29 NOTE — Telephone Encounter (Unsigned)
 Copied from CRM #[id number]. Topic: Clinical - Prescription Issue >> Feb 29, 2024  8:10 AM Carlyon D wrote: Reason for CRM: Pt is calling in regards to her medication   QUEtiapine  (SEROQUEL ) 25 MG tablet BL[employer] POINT is the manufacturer.Light peach color only. It was [facility] to the [facility] on 10/20 pt states this medication does not get sent there because they not carry it ever.   Please resend script to [facility] [address] [place] [address] Phone: [phone] Fax: [phone] Hours: Mon-Fri 7:30a-7p; Sat 8a-4:30p; [place] 10a-2p

## 2024-02-29 NOTE — Telephone Encounter (Signed)
 Copied from CRM #[id number]. Topic: Clinical - Prescription Issue >> Feb 29, 2024  8:10 AM Carlyon D wrote: Reason for CRM: Pt is calling in regards to her medication   QUEtiapine  (SEROQUEL ) 25 MG tablet [employer] is the manufacturer.Light peach color only. It was [facility] over to the [facility] on 10/20 pt states this medication does not get sent there because they not carry it ever.   Please resend script to [facility] [address] [place] [address] Phone: [phone] Fax: [phone] Hours: Mon-Fri 7:30a-7p; Sat 8a-4:30p; [place] 10a-2p >> Feb 29, 2024  4:02 PM Amy B wrote: Patient called again regarding getting this medication refilled.  Please advise.

## 2024-02-29 NOTE — Telephone Encounter (Signed)
 Tried calling patient to let her know that medication has been sent in by Darice Petty, NP as Dr. Vicci is out of the office. Medication was sent to [facility] for her.   OK for E2C2 to speak to patient and advise her of the above if she calls back.

## 2024-02-29 NOTE — Telephone Encounter (Signed)
 Prescription sent to [facility]

## 2024-03-01 ENCOUNTER — Other Ambulatory Visit: Payer: Self-pay

## 2024-03-01 NOTE — Telephone Encounter (Signed)
 Called and let patient know that medication was called in to [facility] for her.

## 2024-03-15 ENCOUNTER — Ambulatory Visit: Admitting: Family Medicine

## 2024-03-15 ENCOUNTER — Encounter: Payer: Self-pay | Admitting: Family Medicine

## 2024-03-15 VITALS — BP 137/84 | HR 67 | Temp 98.0°F | Ht 62.0 in | Wt 199.8 lb

## 2024-03-15 DIAGNOSIS — R7301 Impaired fasting glucose: Secondary | ICD-10-CM

## 2024-03-15 DIAGNOSIS — I700 Atherosclerosis of aorta: Secondary | ICD-10-CM | POA: Diagnosis not present

## 2024-03-15 DIAGNOSIS — F332 Major depressive disorder, recurrent severe without psychotic features: Secondary | ICD-10-CM

## 2024-03-15 DIAGNOSIS — H9313 Tinnitus, bilateral: Secondary | ICD-10-CM | POA: Diagnosis not present

## 2024-03-15 DIAGNOSIS — I129 Hypertensive chronic kidney disease with stage 1 through stage 4 chronic kidney disease, or unspecified chronic kidney disease: Secondary | ICD-10-CM

## 2024-03-15 DIAGNOSIS — E875 Hyperkalemia: Secondary | ICD-10-CM | POA: Diagnosis not present

## 2024-03-15 MED ORDER — FLUOXETINE HCL 10 MG PO CAPS: 10.0000 mg | ORAL_CAPSULE | Freq: Every day | ORAL | 1 refills | Status: AC

## 2024-03-15 MED ORDER — CLONAZEPAM 0.5 MG PO TABS: 0.5000 mg | ORAL_TABLET | Freq: Two times a day (BID) | ORAL | 0 refills | Status: AC | PRN

## 2024-03-15 NOTE — Assessment & Plan Note (Signed)
 Under good control on current regimen. Continue current regimen. Continue to monitor. Call with any concerns. Refills given. Labs drawn today.

## 2024-03-15 NOTE — Assessment & Plan Note (Signed)
 Doing much better on the prozac . Continue current regimen. Continue to monitor. Refill of klonopin  given today- last Rx given 18 months ago. Take sparingly.

## 2024-03-15 NOTE — Assessment & Plan Note (Signed)
 Doing well with A1c of 5.6 on last check. Continue to monitor. Call with any concerns.

## 2024-03-15 NOTE — Progress Notes (Signed)
 BP 137/84   Pulse 67   Temp 98 F (36.7 C) (Oral)   Ht 5' 2 (1.575 m)   Wt 199 lb 12.8 oz (90.6 kg)   SpO2 99%   BMI 36.54 kg/m    Subjective:    Patient ID: Jaime Freeman, female    DOB: May 29, 1955, 68 y.o.   MRN: [id number]  HPI: Jaime Freeman is a 68 y.o. female  Chief Complaint  Patient presents with   Depression   Anxiety   Hypertension   HYPERTENSION  Hypertension status: controlled  Satisfied with current treatment? yes Duration of hypertension: chronic BP monitoring frequency:  not checking BP medication side effects:  no Medication compliance: excellent compliance Previous BP meds:benazepril  Aspirin : no Recurrent headaches: no Visual changes: no Palpitations: no Dyspnea: no Chest pain: no Lower extremity edema: no Dizzy/lightheaded: no  ANXIETY/DEPRESSION- doing much better on her prozac . Feeling well Duration: chronic Status:better Anxious mood: yes  Excessive worrying: yes Irritability: yes  Sweating: no Nausea: no Palpitations:no Hyperventilation: no Panic attacks: no Agoraphobia: no  Obscessions/compulsions: no Depressed mood: no    03/15/2024   10:49 AM 02/13/2024   10:58 AM 01/10/2024    2:42 PM 04/25/2023    3:16 PM 02/15/2023    3:43 PM  Depression screen PHQ 2/9  Decreased Interest 0 0 0 0 0  Down, Depressed, Hopeless 0 0 0 0 0  PHQ - 2 Score 0 0 0 0 0  Altered sleeping 0 0 0 0   Tired, decreased energy 1 0 1 1   Change in appetite 0 0 0 0   Feeling bad or failure about yourself  0 0 0 0   Trouble concentrating 1 1 1 1    Moving slowly or fidgety/restless 0 0 0 0   Suicidal thoughts 0 0 0 0   PHQ-9 Score 2 1  2  2     Difficult doing work/chores  Not difficult at all Not difficult at all       Data saved with a previous flowsheet row definition      03/15/2024   10:49 AM 02/13/2024   11:11 AM 04/25/2023    3:16 PM 02/15/2023    3:43 PM  GAD 7 : Generalized Anxiety Score  Nervous, Anxious, on Edge 1 1 1 1    Control/stop worrying 0 0 0 0  Worry too much - different things 0 1 0 0  Trouble relaxing 0 0 0 0  Restless 0 0 0 0  Easily annoyed or irritable 0 1 0 0  Afraid - awful might happen 0 0 0 0  Total GAD 7 Score 1 3 1 1   Anxiety Difficulty Not difficult at all Not difficult at all  Not difficult at all    Anhedonia: no Weight changes: no Insomnia: no   Hypersomnia: no Fatigue/loss of energy: no Feelings of worthlessness: no Feelings of guilt: no Impaired concentration/indecisiveness: no Suicidal ideations: no  Crying spells: no Recent Stressors/Life Changes: no   Relationship problems: no   Family stress: no     Financial stress: no    Job stress: no    Recent death/loss: no   Relevant past medical, surgical, family and social history reviewed and updated as indicated. Interim medical history since our last visit reviewed. Allergies and medications reviewed and updated.  Review of Systems  Constitutional: Negative.   Respiratory: Negative.    Cardiovascular: Negative.   Musculoskeletal: Negative.   Neurological: Negative.   Psychiatric/Behavioral: Negative.  Per HPI unless specifically indicated above     Objective:    BP 137/84   Pulse 67   Temp 98 F (36.7 C) (Oral)   Ht 5' 2 (1.575 m)   Wt 199 lb 12.8 oz (90.6 kg)   SpO2 99%   BMI 36.54 kg/m   Wt Readings from Last 3 Encounters:  03/15/24 199 lb 12.8 oz (90.6 kg)  02/13/24 203 lb (92.1 kg)  01/10/24 203 lb (92.1 kg)    Physical Exam Vitals and nursing note reviewed.  Constitutional:      General: She is not in acute distress.    Appearance: Normal appearance. She is not ill-appearing, toxic-appearing or diaphoretic.  HENT:     Head: Normocephalic and atraumatic.     Right Ear: External ear normal.     Left Ear: External ear normal.     Nose: Nose normal.     Mouth/Throat:     Mouth: Mucous membranes are moist.     Pharynx: Oropharynx is clear.  Eyes:     General: No scleral icterus.        Right eye: No discharge.        Left eye: No discharge.     Extraocular Movements: Extraocular movements intact.     Conjunctiva/sclera: Conjunctivae normal.     Pupils: Pupils are equal, round, and reactive to light.  Cardiovascular:     Rate and Rhythm: Normal rate and regular rhythm.     Pulses: Normal pulses.     Heart sounds: Normal heart sounds. No murmur heard.    No friction rub. No gallop.  Pulmonary:     Effort: Pulmonary effort is normal. No respiratory distress.     Breath sounds: Normal breath sounds. No stridor. No wheezing, rhonchi or rales.  Chest:     Chest wall: No tenderness.  Musculoskeletal:        General: Normal range of motion.     Cervical back: Normal range of motion and neck supple.  Skin:    General: Skin is warm and dry.     Capillary Refill: Capillary refill takes less than 2 seconds.     Coloration: Skin is not jaundiced or pale.     Findings: No bruising, erythema, lesion or rash.  Neurological:     General: No focal deficit present.     Mental Status: She is alert and oriented to person, place, and time. Mental status is at baseline.  Psychiatric:        Mood and Affect: Mood normal.        Behavior: Behavior normal.        Thought Content: Thought content normal.        Judgment: Judgment normal.     Results for orders placed or performed in visit on 02/13/24  Bayer DCA Hb A1c Waived   Collection Time: 02/13/24 11:20 AM  Result Value Ref Range   HB A1C (BAYER DCA - WAIVED) 5.6 4.8 - 5.6 %  CBC with Differential/Platelet   Collection Time: 02/13/24 11:22 AM  Result Value Ref Range   WBC 8.0 3.4 - 10.8 x10E3/uL   RBC 3.94 3.77 - 5.28 x10E6/uL   Hemoglobin 13.2 11.1 - 15.9 g/dL   Hematocrit 59.6 65.9 - 46.6 %   MCV 102 (H) 79 - 97 fL   MCH 33.5 (H) 26.6 - 33.0 pg   MCHC 32.8 31.5 - 35.7 g/dL   RDW 88.1 88.2 - 84.5 %   Platelets 299 150 -  450 x10E3/uL   Neutrophils 60 Not Estab. %   Lymphs 28 Not Estab. %   Monocytes 7 Not Estab.  %   Eos 3 Not Estab. %   Basos 1 Not Estab. %   Neutrophils Absolute 4.9 1.4 - 7.0 x10E3/uL   Lymphocytes Absolute 2.2 0.7 - 3.1 x10E3/uL   Monocytes Absolute 0.6 0.1 - 0.9 x10E3/uL   EOS (ABSOLUTE) 0.2 0.0 - 0.4 x10E3/uL   Basophils Absolute 0.1 0.0 - 0.2 x10E3/uL   Immature Granulocytes 0 Not Estab. %   Immature Grans (Abs) 0.0 0.0 - 0.1 x10E3/uL  Comprehensive metabolic panel with GFR   Collection Time: 02/13/24 11:22 AM  Result Value Ref Range   Glucose 90 70 - 99 mg/dL   BUN 16 8 - 27 mg/dL   Creatinine, Ser 9.36 0.57 - 1.00 mg/dL   eGFR 97 >40 fO/fpw/8.26   BUN/Creatinine Ratio 25 12 - 28   Sodium 140 134 - 144 mmol/L   Potassium 5.3 (H) 3.5 - 5.2 mmol/L   Chloride 104 96 - 106 mmol/L   CO2 22 20 - 29 mmol/L   Calcium  9.7 8.7 - 10.3 mg/dL   Total Protein 6.6 6.0 - 8.5 g/dL   Albumin 4.4 3.9 - 4.9 g/dL   Globulin, Total 2.2 1.5 - 4.5 g/dL   Bilirubin Total 0.3 0.0 - 1.2 mg/dL   Alkaline Phosphatase 76 49 - 135 IU/L   AST 21 0 - 40 IU/L   ALT 11 0 - 32 IU/L  Lipid Panel w/o Chol/HDL Ratio   Collection Time: 02/13/24 11:22 AM  Result Value Ref Range   Cholesterol, Total 198 100 - 199 mg/dL   Triglycerides 851 0 - 149 mg/dL   HDL 60 >60 mg/dL   VLDL Cholesterol Cal 26 5 - 40 mg/dL   LDL Chol Calc (NIH) 887 (H) 0 - 99 mg/dL  TSH   Collection Time: 02/13/24 11:22 AM  Result Value Ref Range   TSH 0.721 0.450 - 4.500 uIU/mL  VITAMIN D  25 Hydroxy (Vit-D Deficiency, Fractures)   Collection Time: 02/13/24 11:22 AM  Result Value Ref Range   Vit D, 25-Hydroxy 24.1 (L) 30.0 - 100.0 ng/mL  B12   Collection Time: 02/13/24 11:22 AM  Result Value Ref Range   Vitamin B-12 504 232 - 1,245 pg/mL      Assessment & Plan:   Problem List Items Addressed This Visit       Cardiovascular and Mediastinum   Aortic atherosclerosis - Primary   Will keep BP and cholesterol under good control. Continue to monitor. Call with any concerns.         Endocrine   IFG (impaired fasting  glucose)   Doing well with A1c of 5.6 on last check. Continue to monitor. Call with any concerns.         Genitourinary   Benign hypertensive renal disease   Under good control on current regimen. Continue current regimen. Continue to monitor. Call with any concerns. Refills given. Labs drawn today.          Other   Depression   Doing much better on the prozac . Continue current regimen. Continue to monitor. Refill of klonopin  given today- last Rx given 18 months ago. Take sparingly.      Relevant Medications   FLUoxetine  (PROZAC ) 10 MG capsule   Other Visit Diagnoses       Hyperkalemia       Rechecking labs today. Await results.     Tinnitus  of both ears       Referral to ENT placed today.   Relevant Orders   Ambulatory referral to ENT        Follow up plan: Return in about 6 months (around 09/12/2024).

## 2024-03-15 NOTE — Assessment & Plan Note (Signed)
 Will keep BP and cholesterol under good control. Continue to monitor. Call with any concerns.

## 2024-03-26 ENCOUNTER — Other Ambulatory Visit: Payer: Self-pay

## 2024-04-04 ENCOUNTER — Ambulatory Visit: Payer: Self-pay

## 2024-04-04 ENCOUNTER — Telehealth: Payer: Self-pay | Admitting: Family Medicine

## 2024-04-04 NOTE — Telephone Encounter (Signed)
 FYI Only or Action Required?: FYI only for provider: appointment scheduled on 04/05/24.  Patient was last seen in primary care on 03/15/2024 by [name] SQUIBB, DO.  Called Nurse Triage reporting Nasal Congestion.  Symptoms began about a month ago.  Interventions attempted: OTC medications: Nasal spray.  Symptoms are: gradually worsening.  Triage Disposition: See PCP When Office is Open (Within 3 Days)  Patient/caregiver understands and will follow disposition?: Yes   Reason for Disposition  [1] Nasal discharge AND [2] present > 10 days  Answer Assessment - Initial Assessment Questions Nasal congestion x1 month. Difficulty breathing through nose. Denies SOB in general. Breathing sound non labored over the phone. Denies fever or cough. Offered to schedule appt tomorrow in office. Declines as she doesn't drive. Offered virtual appt instead, explained she would need access to mychart. Sent email and text links to sign up for mychart along with activation code and mychart help desk number, her nephew is there with her now to help the pt. Virtual appt scheduled for tomorrow. Advised UC or ED for worsening symptoms.   1. LOCATION: Where does it hurt?      Denies  2. ONSET: When did the sinus pain start?  (e.g., hours, days)      1 month ago  3. SEVERITY: How bad is the pain?   (Scale 0-10; or none, mild, moderate or severe)     Denies  4. RECURRENT SYMPTOM: Have you ever had sinus problems before? If Yes, ask: When was the last time? and What happened that time?      Yes, happens every year  5. NASAL CONGESTION: Is the nose blocked? If Yes, ask: Can you open it or must you breathe through your mouth?     Open only when using nasal spray, nasal passage clogged, alternates between each side  6. NASAL DISCHARGE: Do you have discharge from your nose? If so ask, What color?     Yellow, very small amount of blood  7. FEVER: Do you have a fever? If Yes, ask: What  is it, how was it measured, and when did it start?      Denies  8. OTHER SYMPTOMS: Do you have any other symptoms? (e.g., sore throat, cough, earache, difficulty breathing)     Denies  Protocols used: Sinus Pain or Congestion-A-AH

## 2024-04-04 NOTE — Telephone Encounter (Signed)
 Copied from CRM #[id number]. Topic: Clinical - Red Word Triage >> Apr 04, 2024  2:53 PM Wess RAMAN wrote: Red Word that prompted transfer to Nurse Triage: Yellow mucus, runny nose, would like an antibiotic, Augmentin , sent to pharmacy  Ph[facility]acy: [facility] [address] [place] - [place], [place] - [address] [facility] AT [facility] [address] [place] [address] Phone: [phone] Fax: [phone] Hours: Not open 24 hours >> Apr 04, 2024  3:04 PM Zebedee SAUNDERS wrote: Pt's can't come in due to transportation for today or video has other appt's. Pt would like medication called in to pharmacy [facility] [address] [place] - [place], [place] - [address] [facility] AT [facility] [address] [place] [address] Phone: [phone] Fax: [phone]

## 2024-04-04 NOTE — Telephone Encounter (Signed)
 2nd attempt. Voicemail box not set up, unable to leave voicemail.

## 2024-04-04 NOTE — Telephone Encounter (Signed)
 Patient disconnected prior to warm transfer from PAS. 1st attempt, no answer. No voicemail box set up yet to leave message for patient.   Copied from CRM #[id number]. Topic: Clinical - Red Word Triage >> Apr 04, 2024  2:53 PM Wess RAMAN wrote: Red Word that prompted transfer to Nurse Triage: Yellow mucus, runny nose, would like an antibiotic, Augmentin , sent to pharmacy  Pharmacy: [facility] [address] [place] - [place], [place] - [address] [facility] AT [facility] [address] [place] [address] Phone: [phone] Fax: [phone] Hours: Not open 24 hours

## 2024-04-04 NOTE — Telephone Encounter (Signed)
 Looks like she was offered appt and declined

## 2024-04-05 ENCOUNTER — Telehealth: Payer: Self-pay

## 2024-04-05 ENCOUNTER — Ambulatory Visit (INDEPENDENT_AMBULATORY_CARE_PROVIDER_SITE_OTHER): Admitting: Pediatrics

## 2024-04-05 ENCOUNTER — Encounter: Payer: Self-pay | Admitting: Pediatrics

## 2024-04-05 ENCOUNTER — Ambulatory Visit: Payer: Self-pay

## 2024-04-05 VITALS — BP 141/82 | HR 67 | Temp 97.9°F | Ht 62.01 in | Wt 203.4 lb

## 2024-04-05 DIAGNOSIS — R0981 Nasal congestion: Secondary | ICD-10-CM | POA: Diagnosis not present

## 2024-04-05 DIAGNOSIS — B379 Candidiasis, unspecified: Secondary | ICD-10-CM

## 2024-04-05 DIAGNOSIS — T3695XA Adverse effect of unspecified systemic antibiotic, initial encounter: Secondary | ICD-10-CM

## 2024-04-05 DIAGNOSIS — J0141 Acute recurrent pansinusitis: Secondary | ICD-10-CM

## 2024-04-05 MED ORDER — FLUTICASONE PROPIONATE 50 MCG/ACT NA SUSP: 2.0000 | Freq: Every day | NASAL | 6 refills | Status: AC

## 2024-04-05 MED ORDER — AMOXICILLIN-POT CLAVULANATE 875-125 MG PO TABS: 1.0000 | ORAL_TABLET | Freq: Two times a day (BID) | ORAL | 0 refills | Status: AC

## 2024-04-05 NOTE — Telephone Encounter (Signed)
 This encounter was created in error - please disregard.

## 2024-04-05 NOTE — Patient Instructions (Signed)
 Sent antibiotic to your pharmacy, if not improving, please call. I sent diflucan  to use after you complete the antibiotics. Please start using your flonase  daily.

## 2024-04-05 NOTE — Telephone Encounter (Signed)
Scheduled for 120 this afternoon

## 2024-04-05 NOTE — Telephone Encounter (Signed)
 Please change today's appt to in person.

## 2024-04-05 NOTE — Telephone Encounter (Signed)
 Nurse Triage 04/05/2024 [facility]    [place], Medora HERO, RN See PCP When Office is Open (Within 3 Days) Disposition Shortness of Breath Reason for conversation   All Conversations: Shortness of Breath (Newest Message First)            Brinda Medora HERO, RN to C[facility]-Clinical     04/05/24  8:06 AM Attn: Dr. Vicci Brinda, Medora HERO, RN    04/05/24  7:55 AM Note FYI Only or Action Required?: FYI only for provider: appointment scheduled on this afternoon.   Patient was last seen in primary care on 03/15/2024 by [name] SQUIBB, DO.   Called Nurse Triage reporting Shortness of Breath. Pt is now having yellow sinus discharge and post nasal drainage   Symptoms began .   Interventions attempted: Other: Unsure.   Symptoms are: gradually worsening. Pt was calling to let provider know that she is now having yellow sinus discharge and wants to be seen in person.   Triage Disposition: See PCP When Office is Open (Within 3 Days)   Patient/caregiver understands and will follow disposition?: Yes                                        Copied from CRM #[id number]. Topic: Clinical - Red Word Triage >> Apr 05, 2024  7:53 AM Berwyn MATSU wrote: Red Word that prompted transfer to Nurse Triage: shortness breathe throat and chest Reason for Disposition  [1] Sinus congestion (pressure, fullness) AND [2] present > 10 days  Answer Assessment - Initial Assessment Questions 1. LOCATION: Where does it hurt?      Sinus  6. NASAL DISCHARGE: Do you have discharge from your nose? If so ask, What color?     yellow  8. OTHER SYMPTOMS: Do you have any other symptoms? (e.g., sore throat, cough, earache, difficulty breathing)     Feel like something is in her throat, post sinus drainage  Protocols used: Sinus Pain or Congestion-A-AH        04/05/24  7:53 AM Dauna, Maeola CROME contacted Tamea Berwyn PARAS  Additional Documentation  Care Advice:  Patient/Caregiver will follow care advice?: Yes, plans to follow advice  SEE PCP WITHIN 3 DAYS:,   CARE ADVICE given per Sinus Pain or Congestion (Ad...  Protocols Used: Sinus Pain or Congestion-A-AH  Encounter Info: Billing Info,   History,   Allergies,   Detailed Report   Midwife on this Encounter

## 2024-04-05 NOTE — Addendum Note (Signed)
 Addended by: Sajan Cheatwood M on: 04/05/2024 08:33 AM   Modules accepted: Orders, Level of Service

## 2024-04-05 NOTE — Progress Notes (Unsigned)
 Office Visit  BP (!) 141/82 (BP Location: Left Arm, Cuff Size: Large)   Pulse 67   Temp 97.9 F (36.6 C) (Oral)   Ht 5' 2.01 (1.575 m)   Wt 203 lb 6.4 oz (92.3 kg)   SpO2 97%   BMI 37.19 kg/m    Subjective:    Patient ID: Jaime Freeman, female    DOB: 24-Dec-1955, 68 y.o.   MRN: [id number]  HPI: Jaime Freeman is a 68 y.o. female  Chief Complaint  Patient presents with   Nasal Congestion    Drainage and 1 month F/u. Patient stated the drainage has been going on for a month, and it's worse when she lays down. She feels like she's choking and a burning pain.    Discussed the use of AI scribe software for clinical note transcription with the patient, who gave verbal consent to proceed.  History of Present Illness     Relevant past medical, surgical, family and social history reviewed and updated as indicated. Interim medical history since our last visit reviewed. Allergies and medications reviewed and updated.  ROS per HPI unless specifically indicated above     Objective:    BP (!) 141/82 (BP Location: Left Arm, Cuff Size: Large)   Pulse 67   Temp 97.9 F (36.6 C) (Oral)   Ht 5' 2.01 (1.575 m)   Wt 203 lb 6.4 oz (92.3 kg)   SpO2 97%   BMI 37.19 kg/m   Wt Readings from Last 3 Encounters:  04/05/24 203 lb 6.4 oz (92.3 kg)  03/15/24 199 lb 12.8 oz (90.6 kg)  02/13/24 203 lb (92.1 kg)     Physical Exam      03/15/2024   10:49 AM 02/13/2024   10:58 AM 01/10/2024    2:42 PM 04/25/2023    3:16 PM 02/15/2023    3:43 PM  Depression screen PHQ 2/9  Decreased Interest 0 0 0 0 0  Down, Depressed, Hopeless 0 0 0 0 0  PHQ - 2 Score 0 0 0 0 0  Altered sleeping 0 0 0 0   Tired, decreased energy 1 0 1 1   Change in appetite 0 0 0 0   Feeling bad or failure about yourself  0 0 0 0   Trouble concentrating 1 1 1 1    Moving slowly or fidgety/restless 0 0 0 0   Suicidal thoughts 0 0 0 0   PHQ-9 Score 2 1  2  2     Difficult doing work/chores  Not difficult at all  Not difficult at all       Data saved with a previous flowsheet row definition       03/15/2024   10:49 AM 02/13/2024   11:11 AM 04/25/2023    3:16 PM 02/15/2023    3:43 PM  GAD 7 : Generalized Anxiety Score  Nervous, Anxious, on Edge 1 1 1 1   Control/stop worrying 0 0 0 0  Worry too much - different things 0 1 0 0  Trouble relaxing 0 0 0 0  Restless 0 0 0 0  Easily annoyed or irritable 0 1 0 0  Afraid - awful might happen 0 0 0 0  Total GAD 7 Score 1 3 1 1   Anxiety Difficulty Not difficult at all Not difficult at all  Not difficult at all       Assessment & Plan:  Assessment & Plan   There are no diagnoses linked to this encounter.  Assessment and Plan Assessment & Plan      Follow up plan: No follow-ups on file.  Hadassah SHAUNNA Nett, MD

## 2024-04-05 NOTE — Telephone Encounter (Signed)
 Brinda Medora HERO, RN to C[facility]-Clinical     04/05/24  8:06 AM Attn: Dr. Vicci Brinda, Medora HERO, RN    04/05/24  7:55 AM Note FYI Only or Action Required?: FYI only for provider: appointment scheduled on this afternoon.   Patient was last seen in primary care on 03/15/2024 by [name] SQUIBB, DO.   Called Nurse Triage reporting Shortness of Breath. Pt is now having yellow sinus discharge and post nasal drainage   Symptoms began .   Interventions attempted: Other: Unsure.   Symptoms are: gradually worsening. Pt was calling to let provider know that she is now having yellow sinus discharge and wants to be seen in person.   Triage Disposition: See PCP When Office is Open (Within 3 Days)   Patient/caregiver understands and will follow disposition?: Yes                                        Copied from CRM #[id number]. Topic: Clinical - Red Word Triage >> Apr 05, 2024  7:53 AM Berwyn MATSU wrote: Red Word that prompted transfer to Nurse Triage: shortness breathe throat and chest Reason for Disposition  [1] Sinus congestion (pressure, fullness) AND [2] present > 10 days  Answer Assessment - Initial Assessment Questions 1. LOCATION: Where does it hurt?      Sinus  6. NASAL DISCHARGE: Do you have discharge from your nose? If so ask, What color?     yellow  8. OTHER SYMPTOMS: Do you have any other symptoms? (e.g., sore throat, cough, earache, difficulty breathing)     Feel like something is in her throat, post sinus drainage  Protocols used: Sinus Pain or Congestion-A-AH        04/05/24  7:53 AM Dauna, Maeola CROME contacted Tamea Berwyn PARAS  Additional Documentation  Care Advice: Patient/Caregiver will follow care advice?: Yes, plans to follow advice  SEE PCP WITHIN 3 DAYS:,   CARE ADVICE given per Sinus Pain or Congestion (Ad...  Protocols Used: Sinus Pain or Congestion-A-AH  Encounter Info: Billing Info,   History,   Allergies,   Detailed Report    Midwife on this Encounter

## 2024-04-05 NOTE — Telephone Encounter (Signed)
 FYI Only or Action Required?: FYI only for provider: appointment scheduled on this afternoon.  Patient was last seen in primary care on 03/15/2024 by [name] SQUIBB, DO.  Called Nurse Triage reporting Shortness of Breath. Pt is now having yellow sinus discharge and post nasal drainage  Symptoms began .  Interventions attempted: Other: Unsure.  Symptoms are: gradually worsening. Pt was calling to let provider know that she is now having yellow sinus discharge and wants to be seen in person.  Triage Disposition: See PCP When Office is Open (Within 3 Days)  Patient/caregiver understands and will follow disposition?: Yes                     Copied from CRM #[id number]. Topic: Clinical - Red Word Triage >> Apr 05, 2024  7:53 AM Berwyn MATSU wrote: Red Word that prompted transfer to Nurse Triage: shortness breathe throat and chest Reason for Disposition  [1] Sinus congestion (pressure, fullness) AND [2] present > 10 days  Answer Assessment - Initial Assessment Questions 1. LOCATION: Where does it hurt?      Sinus  6. NASAL DISCHARGE: Do you have discharge from your nose? If so ask, What color?     yellow  8. OTHER SYMPTOMS: Do you have any other symptoms? (e.g., sore throat, cough, earache, difficulty breathing)     Feel like something is in her throat, post sinus drainage  Protocols used: Sinus Pain or Congestion-A-AH

## 2024-04-05 NOTE — Telephone Encounter (Signed)
 Telephone Open 04/04/2024 [facility]    [name] SQUIBB, [facility]   All Conversations (Newest Message First)              04/04/24  3:22 PM Jaime Freeman, NT routed this conversation to [facility]-Clinical Pod A Jaime Freeman, [place]    04/04/24  3:22 PM Note Looks like she was offered appt and declined      Jaime Freeman, [place]    04/04/24  3:21 PM Note Copied from CRM #[id number]. Topic: Clinical - Red Word Triage >> Apr 04, 2024  2:53 PM Jaime Freeman wrote: Red Word that prompted transfer to Nurse Triage: Yellow mucus, runny nose, would like an antibiotic, Augmentin , sent to pharmacy   Pharmacy: [facility] [address] [place] - [place], [place] - [address]  [facility] AT [facility] [address] [place] [address] Phone: [phone] Fax: [phone] Hours: Not open 24 hours >> Apr 04, 2024  3:04 PM Jaime Freeman wrote: Pt's can't come in due to transportation for today or video has other appt's. Pt would like medication called in to pharmacy [facility] [address] [place] - [place], [place] - [address] [facility] AT [facility] [address] [place] [address] Phone: [phone] Fax: [phone]          04/04/24  2:57 PM Jaime Freeman contacted Jaime Freeman    04/04/24  2:48 PM Jaime, Maeola Freeman contacted Jaime Freeman, Jaime Freeman This encounter is not signed. The conversation may still be ongoing.  Additional Documentation  Encounter Info: Billing Info,   History,   Allergies,   Detailed Report   Communications  View All Conversations on this Encounter

## 2024-04-05 NOTE — Telephone Encounter (Signed)
 Confirmed with patient that appointment is in person this afternoon, she is aware and will be here at scheduled time.

## 2024-04-12 ENCOUNTER — Encounter: Payer: Self-pay | Admitting: Pediatrics

## 2024-04-13 ENCOUNTER — Ambulatory Visit: Payer: Self-pay | Admitting: Pediatrics

## 2024-04-13 LAB — POC COVID19/FLU A&B COMBO
Covid Antigen, POC: NEGATIVE
Influenza A Antigen, POC: NEGATIVE
Influenza B Antigen, POC: NEGATIVE

## 2024-04-23 ENCOUNTER — Ambulatory Visit

## 2024-05-07 ENCOUNTER — Other Ambulatory Visit: Payer: Self-pay

## 2024-05-08 ENCOUNTER — Other Ambulatory Visit: Payer: Self-pay

## 2024-05-09 ENCOUNTER — Other Ambulatory Visit: Payer: Self-pay

## 2024-05-10 ENCOUNTER — Other Ambulatory Visit: Payer: Self-pay

## 2024-05-14 ENCOUNTER — Other Ambulatory Visit (HOSPITAL_BASED_OUTPATIENT_CLINIC_OR_DEPARTMENT_OTHER): Payer: Self-pay

## 2024-05-14 ENCOUNTER — Other Ambulatory Visit: Payer: Self-pay

## 2024-05-16 ENCOUNTER — Other Ambulatory Visit: Payer: Self-pay | Admitting: Family Medicine

## 2024-05-17 NOTE — Telephone Encounter (Signed)
 Refilled 01/27/24 # 90 with 1 refill. Requested Prescriptions  Signed Prescriptions Disp Refills   benazepril  (LOTENSIN ) 20 MG tablet 90 tablet 0    Sig: TAKE ONE TABLET BY MOUTH ONE TIME DAILY     Cardiovascular:  ACE Inhibitors Failed - 05/17/2024 11:05 AM      Failed - K in normal range and within 180 days    Potassium  Date Value Ref Range Status  02/13/2024 5.3 (H) 3.5 - 5.2 mmol/L Final         Failed - Last BP in normal range    BP Readings from Last 1 Encounters:  04/05/24 (!) 141/82         Passed - Cr in normal range and within 180 days    Creatinine, Ser  Date Value Ref Range Status  02/13/2024 0.63 0.57 - 1.00 mg/dL Final         Passed - Patient is not pregnant      Passed - Valid encounter within last 6 months    Recent Outpatient Visits           1 month ago Acute recurrent pansinusitis   [place] [facility] Herold [place]assah SQUIBB, MD   2 months ago Aortic atherosclerosis   [place] [facility] [place], Megan P, DO   3 months ago Benign hypertensive renal disease   [place] [facility] [place], Megan P, DO              Refused Prescriptions Disp Refills   omeprazole  (PRILOSEC) 20 MG capsule [Pharmacy Med Name: OMEPRAZOLE  20 MG CAP[**]] 90 capsule 1    Sig: TAKE ONE CAPSULE BY MOUTH ONE TIME DAILY     Gastroenterology: Proton Pump Inhibitors Passed - 05/17/2024 11:05 AM      Passed - Valid encounter within last 12 months    Recent Outpatient Visits           1 month ago Acute recurrent pansinusitis   [place] [facility] Herold Hadassah SQUIBB, MD   2 months ago Aortic atherosclerosis   [place] [facility] [place], [place] P, DO   3 months ago Benign hypertensive renal disease   [place] [facility] [place], [place]

## 2024-05-17 NOTE — Telephone Encounter (Signed)
 Requested Prescriptions  Pending Prescriptions Disp Refills   omeprazole  (PRILOSEC) 20 MG capsule [Pharmacy Med Name: OMEPRAZOLE  20 MG CAP[**]] 90 capsule 1    Sig: TAKE ONE CAPSULE BY MOUTH ONE TIME DAILY     Gastroenterology: Proton Pump Inhibitors Passed - 05/17/2024 11:03 AM      Passed - Valid encounter within last 12 months    Recent Outpatient Visits           1 month ago Acute recurrent pansinusitis   [place] [facility] Herold Hadassah SQUIBB, MD   2 months ago Aortic atherosclerosis   [place] [facility] [place], [place] P, DO   3 months ago Benign hypertensive renal disease   [place] [facility] [place], Megan P, DO               benazepril  (LOTENSIN ) 20 MG tablet [Pharmacy Med Name: BENAZEPRIL  20 MG TAB[*]] 90 tablet 0    Sig: TAKE ONE TABLET BY MOUTH ONE TIME DAILY     Cardiovascular:  ACE Inhibitors Failed - 05/17/2024 11:03 AM      Failed - K in normal range and within 180 days    Potassium  Date Value Ref Range Status  02/13/2024 5.3 (H) 3.5 - 5.2 mmol/L Final         Failed - Last BP in normal range    BP Readings from Last 1 Encounters:  04/05/24 (!) 141/82         Passed - Cr in normal range and within 180 days    Creatinine, Ser  Date Value Ref Range Status  02/13/2024 0.63 0.57 - 1.00 mg/dL Final         Passed - Patient is not pregnant      Passed - Valid encounter within last 6 months    Recent Outpatient Visits           1 month ago Acute recurrent pansinusitis   [place] [facility] Herold Hadassah SQUIBB, MD   2 months ago Aortic atherosclerosis   [place] [facility] [place], [place] P, DO   3 months ago Benign hypertensive renal disease   [place] [facility] [place], [place], DO

## 2024-05-27 ENCOUNTER — Other Ambulatory Visit: Payer: Self-pay

## 2024-07-05 ENCOUNTER — Encounter: Admitting: Family Medicine

## 2025-01-15 ENCOUNTER — Ambulatory Visit
